# Patient Record
Sex: Female | Born: 1955 | Race: White | Hispanic: No | Marital: Married | Smoking: Former smoker
Health system: Southern US, Community
[De-identification: ages and names within clinical notes are randomized; demographics above are authoritative.]

## PROBLEM LIST (undated history)

## (undated) DIAGNOSIS — L509 Urticaria, unspecified: Secondary | ICD-10-CM

## (undated) DIAGNOSIS — Z8719 Personal history of other diseases of the digestive system: Secondary | ICD-10-CM

## (undated) DIAGNOSIS — F419 Anxiety disorder, unspecified: Secondary | ICD-10-CM

## (undated) DIAGNOSIS — F32A Depression, unspecified: Secondary | ICD-10-CM

## (undated) DIAGNOSIS — K5792 Diverticulitis of intestine, part unspecified, without perforation or abscess without bleeding: Secondary | ICD-10-CM

## (undated) DIAGNOSIS — C801 Malignant (primary) neoplasm, unspecified: Secondary | ICD-10-CM

## (undated) DIAGNOSIS — I2510 Atherosclerotic heart disease of native coronary artery without angina pectoris: Secondary | ICD-10-CM

## (undated) DIAGNOSIS — E785 Hyperlipidemia, unspecified: Secondary | ICD-10-CM

## (undated) DIAGNOSIS — I10 Essential (primary) hypertension: Secondary | ICD-10-CM

## (undated) DIAGNOSIS — J189 Pneumonia, unspecified organism: Secondary | ICD-10-CM

## (undated) DIAGNOSIS — K5790 Diverticulosis of intestine, part unspecified, without perforation or abscess without bleeding: Secondary | ICD-10-CM

## (undated) DIAGNOSIS — G8929 Other chronic pain: Secondary | ICD-10-CM

## (undated) DIAGNOSIS — N200 Calculus of kidney: Secondary | ICD-10-CM

## (undated) DIAGNOSIS — I219 Acute myocardial infarction, unspecified: Secondary | ICD-10-CM

## (undated) DIAGNOSIS — Z87442 Personal history of urinary calculi: Secondary | ICD-10-CM

## (undated) DIAGNOSIS — M797 Fibromyalgia: Secondary | ICD-10-CM

## (undated) DIAGNOSIS — N289 Disorder of kidney and ureter, unspecified: Secondary | ICD-10-CM

## (undated) DIAGNOSIS — M545 Low back pain, unspecified: Secondary | ICD-10-CM

## (undated) DIAGNOSIS — I6529 Occlusion and stenosis of unspecified carotid artery: Secondary | ICD-10-CM

## (undated) DIAGNOSIS — M1990 Unspecified osteoarthritis, unspecified site: Secondary | ICD-10-CM

## (undated) DIAGNOSIS — I2582 Chronic total occlusion of coronary artery: Secondary | ICD-10-CM

## (undated) DIAGNOSIS — K219 Gastro-esophageal reflux disease without esophagitis: Secondary | ICD-10-CM

## (undated) HISTORY — PX: TONSILLECTOMY AND ADENOIDECTOMY: SUR1326

## (undated) HISTORY — DX: Calculus of kidney: N20.0

## (undated) HISTORY — DX: Anxiety disorder, unspecified: F41.9

## (undated) HISTORY — DX: Essential (primary) hypertension: I10

## (undated) HISTORY — DX: Hyperlipidemia, unspecified: E78.5

## (undated) HISTORY — PX: ECTOPIC PREGNANCY SURGERY: SHX613

## (undated) HISTORY — PX: OTHER SURGICAL HISTORY: SHX169

## (undated) HISTORY — PX: CARDIAC SURGERY: SHX584

## (undated) HISTORY — DX: Urticaria, unspecified: L50.9

## (undated) HISTORY — DX: Gastro-esophageal reflux disease without esophagitis: K21.9

## (undated) HISTORY — DX: Atherosclerotic heart disease of native coronary artery without angina pectoris: I25.10

## (undated) HISTORY — PX: TUBAL LIGATION: SHX77

## (undated) HISTORY — PX: KNEE ARTHROPLASTY: SHX992

## (undated) HISTORY — DX: Occlusion and stenosis of unspecified carotid artery: I65.29

## (undated) HISTORY — PX: CHOLECYSTECTOMY: SHX55

---
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
## 1999-07-19 ENCOUNTER — Ambulatory Visit (HOSPITAL_BASED_OUTPATIENT_CLINIC_OR_DEPARTMENT_OTHER): Admission: RE | Admit: 1999-07-19 | Discharge: 1999-07-19 | Payer: Self-pay | Admitting: Plastic Surgery

## 2005-09-04 ENCOUNTER — Ambulatory Visit: Payer: Self-pay | Admitting: Cardiology

## 2005-09-13 ENCOUNTER — Ambulatory Visit: Payer: Self-pay | Admitting: Cardiology

## 2005-09-14 ENCOUNTER — Ambulatory Visit: Payer: Self-pay | Admitting: Cardiology

## 2005-09-21 ENCOUNTER — Ambulatory Visit: Payer: Self-pay | Admitting: Cardiology

## 2005-09-29 ENCOUNTER — Ambulatory Visit: Payer: Self-pay | Admitting: Cardiology

## 2005-09-29 ENCOUNTER — Inpatient Hospital Stay (HOSPITAL_BASED_OUTPATIENT_CLINIC_OR_DEPARTMENT_OTHER): Admission: RE | Admit: 2005-09-29 | Discharge: 2005-09-29 | Payer: Self-pay | Admitting: Cardiology

## 2010-11-27 DIAGNOSIS — I2119 ST elevation (STEMI) myocardial infarction involving other coronary artery of inferior wall: Secondary | ICD-10-CM

## 2010-11-27 HISTORY — DX: ST elevation (STEMI) myocardial infarction involving other coronary artery of inferior wall: I21.19

## 2011-03-12 ENCOUNTER — Inpatient Hospital Stay (HOSPITAL_COMMUNITY)
Admission: EM | Admit: 2011-03-12 | Discharge: 2011-03-14 | DRG: 853 | Disposition: A | Discharge status: Home / Self Care | Payer: BLUE CROSS/BLUE SHIELD | Source: Other Acute Inpatient Hospital | Attending: Cardiovascular Disease | Admitting: Cardiovascular Disease

## 2011-03-12 DIAGNOSIS — E785 Hyperlipidemia, unspecified: Secondary | ICD-10-CM | POA: Diagnosis present

## 2011-03-12 DIAGNOSIS — F17200 Nicotine dependence, unspecified, uncomplicated: Secondary | ICD-10-CM | POA: Diagnosis present

## 2011-03-12 DIAGNOSIS — Z7902 Long term (current) use of antithrombotics/antiplatelets: Secondary | ICD-10-CM

## 2011-03-12 DIAGNOSIS — I2510 Atherosclerotic heart disease of native coronary artery without angina pectoris: Secondary | ICD-10-CM

## 2011-03-12 DIAGNOSIS — I2119 ST elevation (STEMI) myocardial infarction involving other coronary artery of inferior wall: Principal | ICD-10-CM | POA: Diagnosis present

## 2011-03-12 DIAGNOSIS — Z7982 Long term (current) use of aspirin: Secondary | ICD-10-CM

## 2011-03-12 DIAGNOSIS — F411 Generalized anxiety disorder: Secondary | ICD-10-CM | POA: Diagnosis present

## 2011-03-12 DIAGNOSIS — Z91199 Patient's noncompliance with other medical treatment and regimen due to unspecified reason: Secondary | ICD-10-CM

## 2011-03-12 DIAGNOSIS — I10 Essential (primary) hypertension: Secondary | ICD-10-CM | POA: Diagnosis present

## 2011-03-12 DIAGNOSIS — Z9119 Patient's noncompliance with other medical treatment and regimen: Secondary | ICD-10-CM

## 2011-03-12 LAB — HEMOGLOBIN A1C: Mean Plasma Glucose: 108 mg/dL (ref <117)

## 2011-03-12 LAB — CARDIAC PANEL(CRET KIN+CKTOT+MB+TROPI)
Relative Index: 6.5 — ABNORMAL HIGH (ref 0.0–2.5)
Relative Index: 9.2 — ABNORMAL HIGH (ref 0.0–2.5)
Total CK: 362 U/L — ABNORMAL HIGH (ref 7–177)
Troponin I: 0.93 ng/mL (ref 0.00–0.06)
Troponin I: 3.25 ng/mL (ref 0.00–0.06)

## 2011-03-12 LAB — COMPREHENSIVE METABOLIC PANEL
ALT: 24 U/L (ref 0–35)
AST: 32 U/L (ref 0–37)
Alkaline Phosphatase: 74 U/L (ref 39–117)
CO2: 25 mEq/L (ref 19–32)
Calcium: 8.5 mg/dL (ref 8.4–10.5)
GFR calc Af Amer: >60 mL/min (ref >60)
GFR calc non Af Amer: >60 mL/min (ref >60)
Glucose, Bld: 121 mg/dL — ABNORMAL HIGH (ref 70–99)
Potassium: 3.7 mEq/L (ref 3.5–5.1)
Sodium: 138 mEq/L (ref 135–145)

## 2011-03-12 LAB — CBC
HCT: 38.3 % (ref 36.0–46.0)
Hemoglobin: 13 g/dL (ref 12.0–15.0)
MCH: 30.7 pg (ref 26.0–34.0)
MCV: 90.3 fL (ref 78.0–100.0)
Platelets: 305 10*3/uL (ref 150–400)
RBC: 4.24 MIL/uL (ref 3.87–5.11)
WBC: 11.2 10*3/uL — ABNORMAL HIGH (ref 4.0–10.5)

## 2011-03-12 LAB — MRSA PCR SCREENING: MRSA by PCR: NEGATIVE

## 2011-03-13 DIAGNOSIS — I2119 ST elevation (STEMI) myocardial infarction involving other coronary artery of inferior wall: Secondary | ICD-10-CM

## 2011-03-13 DIAGNOSIS — I517 Cardiomegaly: Secondary | ICD-10-CM

## 2011-03-13 LAB — BASIC METABOLIC PANEL
BUN: 7 mg/dL (ref 6–23)
CO2: 27 mEq/L (ref 19–32)
Glucose, Bld: 102 mg/dL — ABNORMAL HIGH (ref 70–99)
Potassium: 3.8 mEq/L (ref 3.5–5.1)
Sodium: 138 mEq/L (ref 135–145)

## 2011-03-13 LAB — CBC
HCT: 38.4 % (ref 36.0–46.0)
Hemoglobin: 12.5 g/dL (ref 12.0–15.0)
MCV: 91.9 fL (ref 78.0–100.0)
WBC: 9.8 10*3/uL (ref 4.0–10.5)

## 2011-03-13 LAB — LIPID PANEL
HDL: 48 mg/dL (ref >39)
Total CHOL/HDL Ratio: 4.8 RATIO
Triglycerides: 212 mg/dL — ABNORMAL HIGH (ref <150)
VLDL: 42 mg/dL — ABNORMAL HIGH (ref 0–40)

## 2011-03-13 LAB — TSH: TSH: 1.221 u[IU]/mL (ref 0.350–4.500)

## 2011-03-13 LAB — CARDIAC PANEL(CRET KIN+CKTOT+MB+TROPI): Relative Index: 8.1 — ABNORMAL HIGH (ref 0.0–2.5)

## 2011-03-14 LAB — CBC
HCT: 38.2 % (ref 36.0–46.0)
MCH: 30.6 pg (ref 26.0–34.0)
MCHC: 33 g/dL (ref 30.0–36.0)
MCV: 92.7 fL (ref 78.0–100.0)
RDW: 13.7 % (ref 11.5–15.5)

## 2011-03-14 LAB — BASIC METABOLIC PANEL
BUN: 10 mg/dL (ref 6–23)
Creatinine, Ser: 0.73 mg/dL (ref 0.4–1.2)
GFR calc non Af Amer: >60 mL/min (ref >60)
Glucose, Bld: 99 mg/dL (ref 70–99)
Potassium: 4 mEq/L (ref 3.5–5.1)

## 2011-03-17 ENCOUNTER — Telehealth: Payer: Self-pay | Admitting: *Deleted

## 2011-03-17 NOTE — Telephone Encounter (Signed)
Pt c/o soreness in her chest. She states it has a tingling feeling in it. She also described this as a burning sensation. It's not bad like it was with the heart attack. Pt states she used NTG last night (x1) and it helped the pain "somewhat". Pt is aware she can use up to 3 NTG over a 15 minute period. She is also aware she should go to ER for pain that is not relieved w/NTGx3 or for worsening/continued pain. She has a f/u appt w/Dr. Antoine Poche on 03/28/11.

## 2011-03-20 ENCOUNTER — Emergency Department (HOSPITAL_COMMUNITY): Payer: BC Managed Care – PPO

## 2011-03-20 ENCOUNTER — Inpatient Hospital Stay (HOSPITAL_COMMUNITY)
Admission: EM | Admit: 2011-03-20 | Discharge: 2011-03-22 | DRG: 125 | Disposition: A | Discharge status: Home / Self Care | Payer: BC Managed Care – PPO | Attending: Cardiology | Admitting: Cardiology

## 2011-03-20 DIAGNOSIS — Z7902 Long term (current) use of antithrombotics/antiplatelets: Secondary | ICD-10-CM

## 2011-03-20 DIAGNOSIS — I10 Essential (primary) hypertension: Secondary | ICD-10-CM | POA: Diagnosis present

## 2011-03-20 DIAGNOSIS — I2119 ST elevation (STEMI) myocardial infarction involving other coronary artery of inferior wall: Secondary | ICD-10-CM | POA: Diagnosis present

## 2011-03-20 DIAGNOSIS — Z7982 Long term (current) use of aspirin: Secondary | ICD-10-CM

## 2011-03-20 DIAGNOSIS — Z01810 Encounter for preprocedural cardiovascular examination: Secondary | ICD-10-CM

## 2011-03-20 DIAGNOSIS — Z01818 Encounter for other preprocedural examination: Secondary | ICD-10-CM

## 2011-03-20 DIAGNOSIS — I209 Angina pectoris, unspecified: Principal | ICD-10-CM | POA: Diagnosis present

## 2011-03-20 DIAGNOSIS — F17200 Nicotine dependence, unspecified, uncomplicated: Secondary | ICD-10-CM | POA: Diagnosis present

## 2011-03-20 DIAGNOSIS — Z01812 Encounter for preprocedural laboratory examination: Secondary | ICD-10-CM

## 2011-03-20 DIAGNOSIS — E785 Hyperlipidemia, unspecified: Secondary | ICD-10-CM | POA: Diagnosis present

## 2011-03-20 DIAGNOSIS — F411 Generalized anxiety disorder: Secondary | ICD-10-CM | POA: Diagnosis present

## 2011-03-20 DIAGNOSIS — R079 Chest pain, unspecified: Secondary | ICD-10-CM | POA: Diagnosis present

## 2011-03-20 DIAGNOSIS — I2510 Atherosclerotic heart disease of native coronary artery without angina pectoris: Secondary | ICD-10-CM | POA: Diagnosis present

## 2011-03-20 DIAGNOSIS — Z9861 Coronary angioplasty status: Secondary | ICD-10-CM

## 2011-03-20 LAB — CBC
MCH: 31.4 pg (ref 26.0–34.0)
MCV: 90.1 fL (ref 78.0–100.0)
Platelets: 298 10*3/uL (ref 150–400)
RDW: 13 % (ref 11.5–15.5)
WBC: 10 10*3/uL (ref 4.0–10.5)

## 2011-03-20 LAB — DIFFERENTIAL
Eosinophils Absolute: 0.2 10*3/uL (ref 0.0–0.7)
Eosinophils Relative: 2 % (ref 0–5)
Lymphs Abs: 2.8 10*3/uL (ref 0.7–4.0)

## 2011-03-20 LAB — BASIC METABOLIC PANEL
BUN: 14 mg/dL (ref 6–23)
Chloride: 105 mEq/L (ref 96–112)
Glucose, Bld: 108 mg/dL — ABNORMAL HIGH (ref 70–99)
Potassium: 3.7 mEq/L (ref 3.5–5.1)

## 2011-03-20 LAB — POCT CARDIAC MARKERS: CKMB, poc: 1.2 ng/mL (ref 1.0–8.0)

## 2011-03-20 NOTE — H&P (Signed)
  [name], [name]              ACCOUNT NO.:  [id number]  MEDICAL RECORD NO.:  [id number]           PATIENT TYPE:  I  LOCATION:  2910                         FACILITY:  [facility]  PHYSICIAN:  Armanda Magic, M.D.     DATE OF BIRTH:  June 18, 1956  DATE OF ADMISSION:  03/12/2011 DATE OF DISCHARGE:                             HISTORY & PHYSICAL   REFERRING PHYSICIAN:  [facility].  CHIEF COMPLAINT:  Chest pain.  HISTORY OF PRESENT ILLNESS:  This is a 55 year old white female with a history of nonobstructive coronary artery disease by cath in 2006, who presented to [facility] ER with complaints of substernal chest pain, starting around 2:00 a.m., which awakened her from sleep.  She has been having spells prior to this but very erratic.  She describes severe pressure midsternal with radiation to the arms bilaterally, shortness of breath, nausea, and diaphoresis.  Currently, she appears in moderate distress due to chest pain.  PAST MEDICAL HISTORY:  Includes hypertension, dyslipidemia, statin intolerant, anxiety.  MEDICATIONS:  Effexor, lorazepam.  SOCIAL HISTORY:  She used to smoke but quit in January 2012.  She occasionally drinks alcohol.  REVIEW OF SYSTEMS:  Otherwise as stated in HPI is negative.  PAST SURGICAL HISTORY:  Cholecystectomy, ectopic pregnancy, and tonsillectomy.  ALLERGIES:  None.  FAMILY HISTORY:  Her mother had an MI and her father has atrial fibrillation.  PHYSICAL EXAMINATION:  GENERAL:  Well-developed, well-nourished obese white female, in no acute distress. HEENT:  Benign. NECK:  Supple without lymphadenopathy.  Carotid upstrokes +2 bilaterally.  No bruits. LUNGS:  Clear to auscultation throughout. HEART:  Regular rate and rhythm.  No murmurs, rubs, or gallops.  Normal S1 and S2. ABDOMEN:  Soft, nontender, nondistended with active bowel sounds.  No hepatosplenomegaly. EXTREMITIES:  No cyanosis, erythema, or edema.  LABS:  Sodium 140,  potassium 3.7, chloride 106, bicarb 25, white cell count 9.1, hemoglobin 13.4, hematocrit 39.8, platelet count 364.  EKG shows sinus bradycardia with 1-2 mm ST elevation inferiorly and 1-mm ST depression in I and aVL.  Chest x-ray is pending.  ASSESSMENT: 1. ST elevation myocardial infarction inferiorly. 2. Hypertension, currently not on a medication. 3. Dyslipidemia, statin intolerant. 4. Medical noncompliance.  PLAN:  Emergent cath per Dr. Kirke Corin.  We will start aspirin, Plavix, continue IV heparin, no beta-blocker currently secondary to bradycardia. Further workup per Dr. Kirke Corin.     Armanda Magic, M.D.     TT/MEDQ  D:  03/13/2011  T:  03/13/2011  Job:  [id number]  Electronically Signed by Armanda Magic M.D. on 03/20/2011 07:44:15 PM

## 2011-03-21 DIAGNOSIS — R079 Chest pain, unspecified: Secondary | ICD-10-CM

## 2011-03-21 LAB — APTT: aPTT: 31 seconds (ref 24–37)

## 2011-03-21 LAB — COMPREHENSIVE METABOLIC PANEL
Alkaline Phosphatase: 89 U/L (ref 39–117)
BUN: 14 mg/dL (ref 6–23)
Calcium: 9.7 mg/dL (ref 8.4–10.5)
Glucose, Bld: 107 mg/dL — ABNORMAL HIGH (ref 70–99)
Potassium: 4 mEq/L (ref 3.5–5.1)
Total Protein: 6.9 g/dL (ref 6.0–8.3)

## 2011-03-21 LAB — CARDIAC PANEL(CRET KIN+CKTOT+MB+TROPI)
CK, MB: 1.7 ng/mL (ref 0.3–4.0)
Total CK: 101 U/L (ref 7–177)

## 2011-03-21 LAB — HEPARIN LEVEL (UNFRACTIONATED): Heparin Unfractionated: 0.71 IU/mL — ABNORMAL HIGH (ref 0.30–0.70)

## 2011-03-21 LAB — CK TOTAL AND CKMB (NOT AT ARMC)
CK, MB: 2.1 ng/mL (ref 0.3–4.0)
Total CK: 129 U/L (ref 7–177)

## 2011-03-21 LAB — TROPONIN I: Troponin I: 0.06 ng/mL (ref 0.00–0.06)

## 2011-03-22 DIAGNOSIS — I2510 Atherosclerotic heart disease of native coronary artery without angina pectoris: Secondary | ICD-10-CM

## 2011-03-23 ENCOUNTER — Emergency Department (HOSPITAL_COMMUNITY)
Admission: EM | Admit: 2011-03-23 | Discharge: 2011-03-23 | Disposition: A | Discharge status: Home / Self Care | Payer: BC Managed Care – PPO | Attending: Emergency Medicine | Admitting: Emergency Medicine

## 2011-03-23 ENCOUNTER — Emergency Department (HOSPITAL_COMMUNITY): Payer: BC Managed Care – PPO

## 2011-03-23 DIAGNOSIS — R51 Headache: Secondary | ICD-10-CM | POA: Insufficient documentation

## 2011-03-23 DIAGNOSIS — R55 Syncope and collapse: Secondary | ICD-10-CM | POA: Insufficient documentation

## 2011-03-23 DIAGNOSIS — R112 Nausea with vomiting, unspecified: Secondary | ICD-10-CM | POA: Insufficient documentation

## 2011-03-23 LAB — POCT CARDIAC MARKERS
Myoglobin, poc: 29.9 ng/mL (ref 12–200)
Myoglobin, poc: 29.8 ng/mL (ref 12–200)

## 2011-03-23 LAB — CBC
MCH: 30.4 pg (ref 26.0–34.0)
MCHC: 33.2 g/dL (ref 30.0–36.0)
Platelets: 269 10*3/uL (ref 150–400)
RBC: 4.11 MIL/uL (ref 3.87–5.11)
RDW: 13 % (ref 11.5–15.5)

## 2011-03-23 LAB — BASIC METABOLIC PANEL
Calcium: 8.7 mg/dL (ref 8.4–10.5)
Creatinine, Ser: 0.63 mg/dL (ref 0.4–1.2)
GFR calc Af Amer: >60 mL/min (ref >60)
GFR calc non Af Amer: >60 mL/min (ref >60)

## 2011-03-23 LAB — DIFFERENTIAL
Basophils Relative: 0 % (ref 0–1)
Eosinophils Absolute: 0.1 10*3/uL (ref 0.0–0.7)
Eosinophils Relative: 1 % (ref 0–5)
Monocytes Absolute: 0.9 10*3/uL (ref 0.1–1.0)
Monocytes Relative: 8 % (ref 3–12)
Neutrophils Relative %: 77 % (ref 43–77)

## 2011-03-23 NOTE — H&P (Signed)
[name], [name]              ACCOUNT NO.:  [id number]  MEDICAL RECORD NO.:  [id number]           PATIENT TYPE:  O  LOCATION:  2011                         FACILITY:  [facility]  PHYSICIAN:  Armanda Magic, M.D.     DATE OF BIRTH:  10/31/1956  DATE OF ADMISSION:  03/20/2011 DATE OF DISCHARGE:                             HISTORY & PHYSICAL   REFERRING PHYSICIAN:  Doug Sou, MD  PRIMARY CARDIOLOGIST:  Rollene Rotunda, MD, [facility]  CHIEF COMPLAINT:  Chest pain.  HISTORY OF PRESENT ILLNESS:  This is a 55 year old female with a history of CAD status post recent acute inferior ST elevation MI on March 12, 2011, at which time, she underwent PCI of the RCA x2 to the proximal and distal RCA and was later discharged to home.  She had been in her usual state of health until the last 3 days when she started having pain between her shoulder blades and, what she calls, spasm in her left chest.  This radiated into her left arm along with left arm numbness similar to her prior MI.  Tonight, she had 2 severe episodes of chest pain and she took 2 sublingual nitroglycerin.  She denied any shortness of breath, but was diaphoretic.  Currently, she is pain free.  Past medical history include: 1. CAD status post ST elevation MI inferiorly status post PCI with     drug-eluting stent to the RCA x2. 2. Dyslipidemia. 3. Hypertension. 4. Tobacco abuse. 5. Anxiety. 6. Normal LV function, EF 60% at the time of cath.  She has no known drug allergies.  FAMILY HISTORY:  Mother had an MI, father has AFib.  PAST SURGICAL HISTORY: 1. Status post cholecystectomy. 2. Surgery for ectopic pregnancy. 3. Tonsillectomy.  SOCIAL HISTORY:  She has a remote history of tobacco and she quit in January 2012.  She occasionally drinks alcohol.  Medications include: 1. Aspirin 81 mg daily. 2. Plavix 75 mg daily. 3. Lipitor 80 mg daily. 4. Lisinopril 5 mg daily. 5. Lorazepam 1 mg nightly p.r.n. 6. Lopressor 25 mg  one half tablet b.i.d. 7. Protonix 40 mg daily. 8. Allegra 180 mg daily. 9. Effexor 75 mg daily.  REVIEW OF SYSTEMS:  Other than what is stated in HPI is negative.  PHYSICAL EXAM:  VITAL SIGNS:  Blood pressure is 127/75, heart rate is 66. GENERAL:  This is a well-developed, well-nourished, white female in no acute distress. HEENT:  Benign. NECK:  Supple without lymphadenopathy.  Carotid upstroke is +2 bilaterally, no bruits. LUNGS:  Clear to auscultation throughout. HEART:  Regular rate and rhythm.  No murmurs, rubs, or gallops.  Normal S1, S2. ABDOMEN:  Soft, nontender, nondistended with active bowel sounds.  No hepatosplenomegaly. EXTREMITIES:  No cyanosis, erythema, or edema.  LABORATORY DATA:  Sodium 138, potassium 3.1, chloride 105, bicarb 27, BUN 14, creatinine 0.67.  White cell count 10, hemoglobin 14, hematocrit 40.2, platelet count 198.  Point-of-care markers negative x1.  EKG shows sinus rhythm at 77 beats per minute with inferior infarct and T-wave inversions in lead III and aVF.  Chest x-ray shows no active disease.  ASSESSMENT: 1. Chest  pain consistent with recent ST elevation myocardial     infarction with drug-eluting stent to the right coronary artery x2,     question whether this may be spasm.  It resolved after sublingual     nitroglycerin.  EKG shows post myocardial infarction T-wave     inversion inferiorly and inferior Q-waves.  No change from EKG at     time of discharge. 2. Hypertension. 3. Dyslipidemia.  PLAN:  Admit for 23-hour observation.  Cycle cardiac enzymes.  IV heparin, nitroglycerin drip.  Continue aspirin, Plavix, beta-blocker, statin, ACE inhibitor.  We will make n.p.o. after midnight.  Further workup per [facility].     Armanda Magic, M.D.     TT/MEDQ  D:  03/21/2011  T:  03/21/2011  Job:  [id number]  cc:   Rollene Rotunda, MD, [facility]  Electronically Signed by Armanda Magic M.D. on 03/23/2011 01:49:31 PM

## 2011-03-24 ENCOUNTER — Telehealth: Payer: Self-pay | Admitting: *Deleted

## 2011-03-24 NOTE — Telephone Encounter (Signed)
Pt's family member left message on voicemail asking for return call regarding medications pt was prescribed at d/c from [facility].  Spoke with pt who states she was prescribed Diltiazem 120mg  daily and Isosorbide Mono 30mg  daily. She states she has had a horrible headache since starting these medications. She states her HA was so bad yesterday that she had nausea/vomiting and went to the ER at [facility]. She was prescribed Norco 5/325mg  for pain.  Pt instructed to try 1/2 tablet of the isosorbide for a couple of days. If headache becomes more manageable or is relieved w/this dosing then increase back to 30mg  daily. Pt notified that generally w/isosorbide the headaches will subside within a week or so as she gets use to this medication. Pt will try this and notify the office of any further problems.

## 2011-03-25 ENCOUNTER — Encounter: Payer: Self-pay | Admitting: Cardiology

## 2011-03-25 DIAGNOSIS — I10 Essential (primary) hypertension: Secondary | ICD-10-CM | POA: Insufficient documentation

## 2011-03-25 DIAGNOSIS — E785 Hyperlipidemia, unspecified: Secondary | ICD-10-CM | POA: Insufficient documentation

## 2011-03-25 DIAGNOSIS — I2510 Atherosclerotic heart disease of native coronary artery without angina pectoris: Secondary | ICD-10-CM | POA: Insufficient documentation

## 2011-03-25 DIAGNOSIS — F419 Anxiety disorder, unspecified: Secondary | ICD-10-CM | POA: Insufficient documentation

## 2011-03-28 ENCOUNTER — Ambulatory Visit (INDEPENDENT_AMBULATORY_CARE_PROVIDER_SITE_OTHER): Payer: BC Managed Care – PPO | Admitting: Cardiology

## 2011-03-28 ENCOUNTER — Encounter: Payer: Self-pay | Admitting: *Deleted

## 2011-03-28 ENCOUNTER — Encounter: Payer: Self-pay | Admitting: Cardiology

## 2011-03-28 VITALS — BP 124/84 | HR 89 | Ht 62.0 in | Wt 169.0 lb

## 2011-03-28 DIAGNOSIS — I2510 Atherosclerotic heart disease of native coronary artery without angina pectoris: Secondary | ICD-10-CM

## 2011-03-28 DIAGNOSIS — F17200 Nicotine dependence, unspecified, uncomplicated: Secondary | ICD-10-CM

## 2011-03-28 DIAGNOSIS — Z720 Tobacco use: Secondary | ICD-10-CM | POA: Insufficient documentation

## 2011-03-28 DIAGNOSIS — E669 Obesity, unspecified: Secondary | ICD-10-CM

## 2011-03-28 DIAGNOSIS — I10 Essential (primary) hypertension: Secondary | ICD-10-CM

## 2011-03-28 DIAGNOSIS — E785 Hyperlipidemia, unspecified: Secondary | ICD-10-CM

## 2011-03-28 DIAGNOSIS — I6529 Occlusion and stenosis of unspecified carotid artery: Secondary | ICD-10-CM | POA: Insufficient documentation

## 2011-03-28 NOTE — Progress Notes (Signed)
HPI The patient presents for followup after a recent cardiac MI.  She had an inferior myocardial infarction April 15. She had 2 drug-eluting stents to the right coronary. She subsequently had recurrent chest pain and had another cardiac catheterization on April 25. She was felt to possibly have coronary spasm as she did rule in.  However, she was found to have patent stents.  She was started on calcium channel blocker and her beta blocker was stopped. Of note her EF was 55-60%.  Since her last hospitalization she has had no recurrent chest pain. She has not required nitroglycerin. She is about to start cardiac rehabilitation. She denies any new shortness of breath, PND or orthopnea. She has had no palpitations, presyncope or syncope. She has had no weight gain or edema. She has continued to have headaches with her Imdur.  No Known Allergies  Current Outpatient Prescriptions  Medication Sig Dispense Refill  . acetaminophen (TYLENOL) 325 MG tablet Take 650 mg by mouth every 4 (four) hours as needed.        Marland Kitchen aspirin 81 MG EC tablet Take 81 mg by mouth daily.        Marland Kitchen atorvastatin (LIPITOR) 80 MG tablet Take 80 mg by mouth daily.        . clopidogrel (PLAVIX) 75 MG tablet Take 75 mg by mouth daily.        Marland Kitchen diltiazem (CARDIZEM CD) 120 MG 24 hr capsule Take 120 mg by mouth daily.        . fexofenadine (ALLEGRA) 180 MG tablet Take 180 mg by mouth daily.        . isosorbide mononitrate (IMDUR) 30 MG 24 hr tablet Take 30 mg by mouth daily.        Marland Kitchen lisinopril (PRINIVIL,ZESTRIL) 5 MG tablet Take 5 mg by mouth daily.        Marland Kitchen LORazepam (ATIVAN) 1 MG tablet Take 1 mg by mouth daily as needed.        . nitroGLYCERIN (NITROSTAT) 0.4 MG SL tablet Place 0.4 mg under the tongue every 5 (five) minutes as needed.        . pantoprazole (PROTONIX) 40 MG tablet Take 40 mg by mouth daily.        Marland Kitchen venlafaxine (EFFEXOR-XR) 75 MG 24 hr capsule Take 75 mg by mouth daily.        Marland Kitchen DISCONTD: pantoprazole (PROTONIX) 20 MG  tablet Take 20 mg by mouth daily.        Marland Kitchen DISCONTD: venlafaxine (EFFEXOR) 75 MG tablet Take 75 mg by mouth daily.          Past Medical History  Diagnosis Date  . CAD (coronary artery disease)     PCI Stent RCA 03/12/11.  Cath 03/22/11 LVEF 55-60%.  RCA patent  proximal/distal stents with 30% proximal disease and 20% mid stenosis.  LCX with 30%  mid stenosis.  Questionable spasm.  Marland Kitchen HTN (hypertension)   . Hyperlipidemia   . Anxiety   . Carotid stenosis     Less than 50% 8/11    Past Surgical History  Procedure Date  . Ectopic pregnancy surgery   . Tonsillectomy and adenoidectomy   . Cholecystectomy     ROS: As stated in the HPI and negative for all other systems.  PHYSICAL EXAM BP 124/84  Pulse 89  Ht 5\' 2"  (1.575 m)  Wt 169 lb (76.658 kg)  BMI 30.91 kg/m2 GENERAL:  Well appearing HEENT:  Pupils equal round and reactive,  fundi not visualized, oral mucosa unremarkable NECK:  No jugular venous distention, waveform within normal limits, carotid upstroke brisk and symmetric, no bruits, no thyromegaly LYMPHATICS:  No cervical, inguinal adenopathy LUNGS:  Clear to auscultation bilaterally BACK:  No CVA tenderness CHEST:  Unremarkable HEART:  PMI not displaced or sustained,S1 and S2 within normal limits, no S3, no S4, no clicks, no rubs, no murmurs ABD:  Flat, positive bowel sounds normal in frequency in pitch, no bruits, no rebound, no guarding, no midline pulsatile mass, no hepatomegaly, no splenomegaly EXT:  2 plus pulses throughout, no edema, no cyanosis no clubbing, Bruising of the right radial catheterization site SKIN:  No rashes no nodules NEURO:  Cranial nerves II through XII grossly intact, motor grossly intact throughout PSYCH:  Cognitively intact, oriented to person place and time  ASSESSMENT AND PLAN

## 2011-03-28 NOTE — Assessment & Plan Note (Signed)
The blood pressure is at target. No change in medications is indicated. We will continue with therapeutic lifestyle changes (TLC).  

## 2011-03-28 NOTE — Patient Instructions (Signed)
   Follow up in 2 months as scheduled. Your physician recommends that you continue on your current medications as directed. Please refer to the Current Medication list given to you today.

## 2011-03-28 NOTE — Assessment & Plan Note (Signed)
She has coronary disease as described and possible spasm. No further testing is indicated at this point she will continue with aggressive risk reduction.

## 2011-03-28 NOTE — Assessment & Plan Note (Signed)
She is committed to losing weight with diet and exercise.

## 2011-03-28 NOTE — Assessment & Plan Note (Signed)
She continues to abstain from cigarettes. I congratulated her on this.Marland Kitchen

## 2011-03-28 NOTE — Assessment & Plan Note (Signed)
She is now tolerating Lipitor which she has not tolerated in the past. I would take an aggressive stand and asked for her LDL to be less than 70 and HDL greater than 50.

## 2011-03-29 NOTE — Cardiovascular Report (Signed)
[name], [name]              ACCOUNT NO.:  [id number]  MEDICAL RECORD NO.:  [id number]           PATIENT TYPE:  O  LOCATION:  6599                         FACILITY:  [facility]  PHYSICIAN:  Lorine Bears, MD     DATE OF BIRTH:  Jan 11, 1956  DATE OF PROCEDURE: DATE OF DISCHARGE:  03/22/2011                           CARDIAC CATHETERIZATION   PRIMARY CARDIOLOGIST:  Rollene Rotunda, MD, [facility]  PROCEDURES PERFORMED: 1. Left heart catheterization 2. Coronary angiography. 3. Left ventricular angiography.  INDICATIONS AND CLINICAL HISTORY:  This is a 55 year old female who was treated recently on March 12, 2011, for inferior ST elevation myocardial infarction.  At that time, I performed her cardiac catheterization, which showed significant mid and distal RCA stenosis.  She had angioplasty and two drug-eluting stent placements at that time.  The rest of her coronary anatomy showed no obstructive disease.  Her ejection fraction was normal.  She did well initially.  However, over the last few days, she has been having intermittent substernal chest tightness radiating to her shoulder and left arm very similar to her symptoms of myocardial infarction, but less intense.  She was admitted to the hospital where her cardiac enzymes were negative.  She did have some inferior EKG changes during these episodes.  Due to her history and symptoms, cardiac catheterization was recommended.  Risks, benefits, and alternatives were discussed with the patient.  Access is right radial artery.  STUDY DETAILS:  Standard informed consent was obtained.  The right radial area was prepped in a sterile fashion.  It was anesthetized with 1% lidocaine.  A 5-French sheath was placed in the right radial artery after an anterior puncture.  Verapamil 3 mg was given through the sheath twice, second time was for suspected spasm.  Unfractionated heparin 4000 units was given intravenously.  Coronary angiography as well  as left ventricular angiography was performed with a Jacky catheter.  This catheter could not engage the left main and thus, JL-3.5 catheter was used.  All catheter exchanges were done over the wire.  At the end of the procedure, the sheath was removed.  TR band was applied.  There was no immediate complications.  STUDY FINDINGS:  Hemodynamic findings:  Aortic pressure is 113/67 with a mean of 87 mmHg.  Left ventricular pressure is 114/17 with a left ventricular end-diastolic pressure of 18 mmHg.  No significant gradient was noted across the aortic valve.  Left ventricular angiography:  This showed normal LV systolic function with an estimated ejection fraction of 55-60% with normal wall motion. This was done by manual injection.  CORONARY ANGIOGRAPHY:  Right coronary artery:  The vessel was normal in size and dominant.  A stent is noted extending from the proximal to the mid area, which is patent and free of any significant restenosis.  There is also a stent distally before the bifurcation, which is also patent with no significant restenosis.  There is a 30% lesion in the proximal RCA before the stent, which is unchanged.  There is also a 20% diffuse disease in the midsegment between the two stent.  The PDA and posterolateral branches are overall  medium size and free of any significant disease. Left main coronary artery:  The vessel was normal in size and free of significant disease. Left anterior descending artery:  The vessel was normal in size with mild atherosclerosis.  There is a discrete 20% lesion in the mid segment.  The rest of the LAD is free of significant disease.  First and second diagonals are small sized branches.  Third diagonal is a large size and free of significant disease. Left circumflex artery:  The vessel was normal in size and nondominant. There is a 30% lesion in the mid segment before the AV groove artery. First and second OMs are very small in size.  OM3  is normal size and free of significant disease.  The AV groove artery is small size and has diffuse atherosclerosis without obstructive disease.  STUDY CONCLUSIONS: 1. Patent stents in the right coronary artery. 2. Possible coronary spasm given her symptoms of chest pain and     associated with inferior ST segment changes. 3. No significant disease in the LAD and left circumflex arteries. 4. Normal LV systolic function with mildly elevated left ventricular     end-diastolic pressure.  RECOMMENDATIONS:  We will treat the patient for presumed coronary spasm. We will change her metoprolol to a calcium channel blocker.  We will also add long-acting nitroglycerin.  I asked her to stay away from any nicotine products.  She will be monitored the rest of the day.  Continue aggressive medical therapy and dual antiplatelet therapy.     Lorine Bears, MD     MA/MEDQ  D:  03/22/2011  T:  03/22/2011  Job:  [id number]  cc:   Rollene Rotunda, MD, [facility]  Electronically Signed by Lorine Bears MD on 03/29/2011 03:22:32 PM

## 2011-03-29 NOTE — Discharge Summary (Signed)
NAMEJEARLDEAN, [name]              ACCOUNT NO.:  [id number]  MEDICAL RECORD NO.:  [id number]           PATIENT TYPE:  O  LOCATION:  6599                         FACILITY:  [facility]  PHYSICIAN:  Lorine Bears, MD     DATE OF BIRTH:  01/03/1956  DATE OF ADMISSION:  03/20/2011 DATE OF DISCHARGE:  03/22/2011                              DISCHARGE SUMMARY   PRIMARY CARDIOLOGIST:  Rollene Rotunda, MD, [facility]  DISCHARGE DIAGNOSIS: 1. Chest pain, question coronary spasm.     a.     Cardiac catheterization, March 22, 2011:  Patent right      coronary artery stents without significant change in other vessels      from prior cath.  Change metoprolol to calcium channel blocker and      add long-acting nitrate.     b.     Follow up as previously scheduled, Mar 28, 2011, at [facility] [place].  SECONDARY DIAGNOSES: 1. Coronary artery disease, March 12, 2011.     a.     Status post inferior ST-elevation myocardial infarction,      percutaneous coronary intervention with drug-eluting stents to      right coronary artery x2. 2. Dyslipidemia. 3. Hypertension. 4. Ongoing tobacco abuse. 5. Anxiety.  ALLERGIES:  NKDA.  PROCEDURES: 1. EKG, March 20, 2011:  NSR, 77 bpm, minimal T-wave inversions in     lead III and aVF and likely significant Q-waves in those leads as     well.  No other significant ST-T wave changes, normal axis, no     evidence of hypertrophy.  PR 162, QRS 82, and QTc of 411. 2. Chest x-ray, March 20, 2011:  No acute cardiopulmonary process. 3. Cardiac catheterization, March 22, 2011:  LVEF 55-60%.  RCA patent     proximal/distal stents with 30% proximal disease and 20% mid     diffuse stenosis.  LM normal.  LAD 20% mid stenosis.  LCX with 30%     mid stenosis.  Question coronary spasm.  HISTORY OF PRESENT ILLNESS:  Ms. Clingenpeel is a 55 year old female with the above-noted complex medical history including inferior STEMI on March 12, 2011, for which she underwent PCI  with DES x2 to the right coronary artery, proximal and distal sections of vessel.  The patient has been under her usual state of health until last 3 days when she started having chest discomfort in her shoulder blades and what she calls as spasm in her left chest.  This discomfort radiated into her left arm and was associated with left arm numbness similar to her symptoms prior to her acute MI.  That night, she had 2 severe episodes of chest discomfort and took 2 sublingual nitroglycerin.  She denied associated symptoms including no shortness breath or diaphoresis.  When evaluated by Cardiology at [facility] ED, she was asymptomatic.  HOSPITAL COURSE:  The patient was admitted and cardiac enzymes were cycled, all of which were negative.  However, the patient had recurrent chest discomfort and given her pretest probability and similar symptoms prior episode, she underwent diagnostic cardiac catheterization on March 22, 2011.  Luckily, there were no significant new findings and patent RCA stents.  However, there still is the question of coronary spasm and due to her similar symptoms previously, her metoprolol will be switched to calcium channel blocker (diltiazem CD 120 mg p.o. daily) and Imdur 30 mg p.o. daily will also be added.  The patient had her postcath orders completed and then was evaluated by her interventional cardiologist, Dr. Lorine Bears who deemed her stable for discharge on the early evening of March 22, 2011.  Given her recurrent symptoms and medication changes, she will continue to follow up with her previously made appointment with Dr. Antoine Poche in [place] on Mar 28, 2011, at 1:15 p.m.  At the time for discharge, the patient received her new medication list, prescriptions, follow up instructions, and postcath instructions.  All questions and concerns were addressed prior to her leaving the hospital.  DISCHARGE LABORATORY DATA:  WBC is 10.0, HGB 14.0, HCT 40.2, and PLT count is  298 with WBC differential within normal limits.  Protime 12.8 and INR 0.94.  Sodium 140, potassium 4.0, chloride 104, bicarb 29, BUN 14, creatinine 0.74, and glucose 107.  Liver function tests within normal limits except for slightly low total bilirubin at 0.2, albumin 3.8, calcium 9.7, and total protein 6.9.  Point-of-care markers negative x1.  Three full sets of enzymes negative.  FOLLOWUP PLANS AND APPOINTMENTS:  Please see hospital course.  DISCHARGE MEDICATIONS: 1. Acetaminophen 325 mg 1-2 tabs p.o. q.4 h p.r.n. 2. Diltiazem CD 120 mg 1 tablet p.o. daily. 3. Imdur 30 mg p.o. daily. 4. Allegra 180 mg p.o. daily. 5. Enteric-coated aspirin 81 mg daily. 6. Clopidogrel 75 mg 1 tablet p.o. daily with meal. 7. Effexor 75 mg 1 capsule p.o. daily. 8. Lipitor 80 mg p.o. at bedtime. 9. Lorazepam 1 mg 1 tablet p.o. at bedtime p.r.n. 10.Lisinopril 5 mg 1 tablet p.o. daily. 11.Nitroglycerin 0.4 mg sublingual q.5 minutes up to 3 doses p.r.n.     for chest discomfort. 12.Protonix 20 mg p.o. daily.  DURATION OF DISCHARGE ENCOUNTER:  Including physician time was 35 minutes.     Jarrett Ables, PAC   ______________________________ Lorine Bears, MD    MS/MEDQ  D:  03/22/2011  T:  03/23/2011  Job:  [id number]  cc:   Rollene Rotunda, MD, [facility]  Electronically Signed by Jarrett Ables PAC on 03/24/2011 02:38:04 PM Electronically Signed by Lorine Bears MD on 03/29/2011 03:25:24 PM

## 2011-03-29 NOTE — Cardiovascular Report (Signed)
[name], [name]              ACCOUNT NO.:  [id number]  MEDICAL RECORD NO.:  [id number]           PATIENT TYPE:  I  LOCATION:  2910                         FACILITY:  [facility]  PHYSICIAN:  Lorine Bears, MD     DATE OF BIRTH:  1956/05/20  DATE OF PROCEDURE:  03/12/2011 DATE OF DISCHARGE:                           CARDIAC CATHETERIZATION   PRIMARY CARE PHYSICIAN:  Dr. Selinda Flavin in [place].  PROCEDURES PERFORMED: 1. Emergent left heart catheterization. 2. Coronary angiography. 3. Left ventricular angiography. 4. Right coronary artery angioplasty and two drug-eluting stent     placement to the distal as well as proximal segment.  Access is right femoral artery.  INDICATION AND CLINICAL HISTORY:  This is a 55 year old female with a past medical history of hypertension, hyperlipidemia and tobacco use. 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  She presented with a acute onset of substernal chest tightness with significant dyspnea.  She went to [facility], where her ECG showed inferior ST elevation.  Thus, she was transferred emergently to the catheterization lab for coronary angiography and possible coronary intervention.  The patient was hemodynamically stable on arrival to the cath lab.  I discussed the risks, benefits and alternatives with the patient.  STUDY DETAILS:  An emergent consent was obtained.  The right femoral area was prepped in a sterile fashion.  It was anesthetized with 1% lidocaine.  A 6-French sheath was placed in the right femoral artery. The patient was already given aspirin 325, Plavix 300 mg as well as 5000 units of unfractionated heparin.  I initiated IV bivalirudin.  Coronary angiography was performed with the JL-4 catheter.  Right coronary angiography and intervention was performed with a JR-4 guiding catheter. LV angiography was performed with a  pigtail catheter.  All catheter exchanges were done over the wire.  INTERVENTIONAL PROCEDURE DETAILS:  I used an intuition wire without much difficulty.  Two lesions in the right coronary artery was noted and both of them were 99%.  I elected to predilate the proximal lesion first with a 2.5 x 12-mm track balloon to 10 atmospheres.  I then dilated the distal lesion with a same balloon to 8 atmosphere.  It appears that the culprit lesion is the distal lesion as it looked hazy and also it was associated with significant hemodynamic changes with every balloon inflation.  After the first inflation in the distal segment, the patient became bradycardiac and hypotensive.  She was treated with normal saline bolus as well as half an amp of atropine.  This improved her hemodynamics significantly with resolved hypotension.  She did not require any dopamine.  I then placed 2.75 x 24-mm Promus element drug- eluting stent in the distal RCA and deployed at the 12 atmosphere.  This was postdilated with a 3.0 x 20-mm  track balloon to 12 atmosphere distally and 14 atmosphere proximally.  I then placed a 3.0 x 33-mm Zions drug-eluting stent in the proximal extending to the mid segment of the right coronary artery and deployed at the 214 atmosphere.  This was postdilated with a 3.5 x 25-mm Rulo track balloon to 16 atmosphere distally and 20 atmosphere in the mid and proximal segment.  Angiography showed excellent results.  I wanted to post dilate the mid segment with a 4.0 noncompliant balloon, but that did not pass the stent.  Thus, given that the final results looked very good I elected not to pursue this any further.  The patient tolerated the procedure well with no immediate complications.  The sheath was removed and the site was closed with a Perclose device.  STUDY FINDINGS:  Hemodynamic findings:  Left ventricular pressure was 138/12 with a left ventricular end-diastolic pressure of 15  mmHg. Aortic pressure was 133/86 with a mean pressure of 108 mmHg.  LEFT VENTRICULAR ANGIOGRAPHY:  This showed normal LV systolic function with an estimated ejection fraction of 60% with normal wall motion.  CORONARY ANGIOGRAPHY:  Right coronary artery:  The vessel was large in size and dominant.  In the proximal segment, there is a 20% stenosis close to the ostium.  In the proximal and extending to the mid segment, there is diffuse 70% disease, pending in a 99% stenosis at an RV marginal branch.  This segment has significant remodelling and localized areas of ectasia.  In the distal RCA close to the bifurcation, there is a 99% hazy lesion likely at the site of a plaque rupture.  The PDA is a medium size and free of significant disease.  The posterolateral branches are free of any significant disease.  LEFT MAIN CORONARY ARTERY:  The vessel was normal in size and free of significant disease.  LEFT ANTERIOR DESCENDING ARTERY:  The vessel was normal in size with minor irregularities without any obstructive disease.  First diagonal is small size overall.  Second diagonal is normal in size and free of significant disease.  LEFT CIRCUMFLEX ARTERY:  The vessel was normal in size and nondominant. There is a 30% discrete lesion in the mid segment.  OM-1 is a very tiny branch.  OM-2 is also small in size.  OM-3 is normal size and free of significant disease.  STUDY CONCLUSION: 1. Acute inferior ST elevation myocardial infarction due to subtotal     occlusion of the right coronary artery into areas.  The culprit     lesion is likely the distal lesion close to the bifurcation at 99%.     However, there was another lesion in the proximal to mid segment at     the 99% as well. 2. Normal LV systolic function. 3. Successful angioplasty and two drug-eluting stent placements to the     distal as well as the proximal right coronary artery.  RECOMMENDATIONS: 1. Aspirin daily indefinitely. 2.  Plavix 75 mg once daily for at least 12 months. 3. Smoking cessation is strongly advised. 4. Aggressive treatment of risk factors is recommended.     Lorine Bears, MD     MA/MEDQ  D:  03/12/2011  T:  03/12/2011  Job:  [id number]  cc:   Selinda Flavin  Electronically Signed by Lorine Bears MD on 03/29/2011 03:21:32 PM

## 2011-04-10 NOTE — Telephone Encounter (Signed)
Pt seen in the office 03/28/11

## 2011-04-13 NOTE — Discharge Summary (Signed)
[name], [name]              ACCOUNT NO.:  [id number]  MEDICAL RECORD NO.:  [id number]           PATIENT TYPE:  I  LOCATION:  2910                         FACILITY:  [facility]  PHYSICIAN:  Marca Ancona, MD      DATE OF BIRTH:  02-13-1956  DATE OF ADMISSION:  03/12/2011 DATE OF DISCHARGE:  03/14/2011                              DISCHARGE SUMMARY   PRIMARY CARDIOLOGIST:  New patient to [facility].  PRIMARY CARE PROVIDER:  Dr. Selinda Flavin.  DISCHARGE DIAGNOSES: 1. Acute inferior ST-elevation myocardial infarction, status post     successful angioplasty, and two drug-eluting stent to the distal as     well as proximal right coronary artery. 2. Hypercholesterolemia. 3. Hypertension. 4. Tobacco abuse.  SECONDARY DIAGNOSIS:  Anxiety.  PROCEDURE/DIAGNOSTICS PERFORMED DURING HOSPITALIZATION: 1. Emergent left heart catheterization with coronary angiography and     left ventricular angiography.     a.     Status post right coronary angioplasty and placement of two      drug-eluting stents.  Proximal segment of the right coronary      artery with an Xience prime LL 3.0 mm x 33 mm drug-eluting stent      as well as a Promus Element Plus drug-eluting stent 2.75 mm x 24      mm to the distal RCA.     b.     Normal LV systolic function, estimated 60%.     c.     Normal left main, left circumflex with 30% discrete lesion      in the mid segment.  Normal LAD. 2. 2-D echocardiogram on March 12, 2011:  Left ventricle with normal     cavity size, mild LVH.  There is mild focal basilar hypertrophy of     the septum.  Ejection fraction estimated at 55%-60%.  No defect or     patent foramen ovale noted.  REASON FOR HOSPITALIZATION:  This is a 55 year old female with the above- stated problem list, and mild nonobstructive coronary artery disease per cardiac catheterization in 2006.  She states she was in her usual state of health until she had acute onset of substernal chest  tightness with significant dyspnea.  She arrived to [facility] where her EKG demonstrated inferior ST elevation.  The patient was subsequently transferred for emergent catheterization to [facility] cath lab.  Risks, benefits, and indications were discussed with the patient for cardiac catheterization.  She agreed to proceed.  HOSPITAL COURSE:  The patient was taken emergently to the cardiac cath lab by Dr. Azucena Kuba on March 12, 2011.  Emergent consent was obtained. Again, EKG showed sinus bradycardia with 1-2 mm ST elevation in the inferior leads as well as 1-mm ST depression and one in aVL.  As above, the patient's acute inferior ST-elevation myocardial infarction was due to a subtotal occlusion of the right coronary artery in two areas.  The culprit lesion was felt to be the distal lesion close to the bifurcation of 99%.  However, the second lesion in the proximal segment was also approximately 99%.  Dr. Azucena Kuba was then performed successful angioplasty and placement  of two drug-eluting stent to the distal as well as proximal right coronary artery.  The patient tolerated the procedure well.  It was noted the patient should be on aspirin indefinitely as well as Plavix for at least 12 months.  She needs aggressive treatment of risk factors specifically smoking cessation.  The patient's troponin peaked at 3.47.  The patient was taken to the CCU for further observation.  The patient was started on beta-blocker post catheterization, but with her bradycardia, this had to be decreased to a lower dose.  She has been tolerating her low-dose beta-blocker well. She was also started on an ACE inhibitor as well as a statin.  These will be continued at discharge.  The patient did walk with cardiac rehab.  At that time, she denied any chest pain.  She did request referral for outpatient rehab in [place] be sent, this has been completed. After walking, the patient returned to her room and states that she  did have 1/10 chest pain.  She took a nitroglycerin and felt improvement. The patient had no further complaints of chest pain.  On day of discharge, the patient ambulated in the halls without difficulty.  She again worked with cardiac rehab and had no further complaints of chest pain or shortness of breath.  On day of discharge, Dr. Shirlee Latch evaluated the patient and noted her stable for home.  She will have close outpatient followup.  Smoking cessation has been encouraged, the patient voices understanding.  The patient will be discharged in stable condition.  DISCHARGE LABS:  Sodium 141, potassium 4, chloride 104, bicarb 30, BUN 10, creatinine 0.73, WBC 10.1, hemoglobin 12.6, hematocrit 38.2, platelets 305.  DISCHARGE MEDICATIONS: 1. Aspirin 81 mg daily. 2. Plavix 75 mg daily. 3. Lipitor 80 mg daily. 4. Lisinopril 5 mg daily. 5. Lopressor 25 mg half tablet twice daily. 6. Protonix 40 mg daily. 7. Allegra 180 mg daily. 8. Effexor 75 mg capsule daily. 9. Lorazepam 1 mg daily at bedtime as needed for sleep. 10.Nitroglycerin sublingual 0.4 mg 1 tablet under tongue with onset of     chest pain and may repeat every 5 minutes up to 3 doses. 11.Prilosec 20 mg daily - please stop taking.  FOLLOWUP PLANS AND INSTRUCTIONS: 1. The patient will follow up with Dr. Antoine Poche in [place] on May 1 at     1:15 p.m. 2. The patient is to increase activity slowly.  She may shower, but no     bathing.  No lifting for 1 week greater than 5 pounds.  No driving     for 2 days.  No sexual activity for 1 week.  She is to keep her     cath site clean and dry and call the office for any problems. 3. The patient is to continue on low-sodium heart-healthy diet. 4. The patient is to avoid straining and is to stop any activity that     causes chest pain or shortness of breath. 5. The patient is to call the office in the interim for any problems     or concerns.  DURATION OF DISCHARGE:  Greater than 30 minutes  with physician and physician extender time.     Leonette Monarch, PA-C   ______________________________ Marca Ancona, MD    NB/MEDQ  D:  03/14/2011  T:  03/15/2011  Job:  [id number]  cc:   Hale Bogus, MD, [facility]  Electronically Signed by Alen Blew P.A. on 03/16/2011 12:31:45 PM Electronically Signed by Freida Busman  [facility] MD on 04/13/2011 12:24:57 PM

## 2011-04-18 ENCOUNTER — Other Ambulatory Visit: Payer: Self-pay | Admitting: *Deleted

## 2011-04-18 MED ORDER — DILTIAZEM HCL ER COATED BEADS 120 MG PO CP24: 120.0000 mg | ORAL_CAPSULE | Freq: Every day | ORAL | Status: DC

## 2011-05-18 ENCOUNTER — Telehealth: Payer: Self-pay | Admitting: *Deleted

## 2011-05-18 NOTE — Telephone Encounter (Signed)
Message left on nurse voicemail r/e her protonix being stopped due to an interaction with plavix. Patient started using zantac for her acid reflux and said it is not working and would like to know what she can use for her acid reflux. Patient stated she saw Dr. Dimas Aguas last week.

## 2011-05-18 NOTE — Telephone Encounter (Signed)
Left message to call back on voice mail

## 2011-05-18 NOTE — Telephone Encounter (Signed)
Pt left message at 1050 to return call.   Spoke with pt who state she is having a terrible time w/reflux. She states she saw Dr. Dimas Aguas last week. She states the Protonix was stopped d/t interaction with Plavix. She states she tried Zantac but this is not helping. She states she s/w Dr. Dimas Aguas who stated she needs a PPI but did not prescribe or recommend a medication. Pt notified to discuss with Dr. Dimas Aguas further to have him manage her reflux. Pt verbalized understanding.

## 2011-05-21 ENCOUNTER — Other Ambulatory Visit: Payer: Self-pay | Admitting: Cardiovascular Disease

## 2011-06-05 ENCOUNTER — Ambulatory Visit: Payer: BC Managed Care – PPO | Admitting: Cardiology

## 2011-06-14 ENCOUNTER — Other Ambulatory Visit: Payer: Self-pay | Admitting: Cardiology

## 2011-07-18 ENCOUNTER — Other Ambulatory Visit: Payer: Self-pay | Admitting: *Deleted

## 2011-07-18 DIAGNOSIS — I2510 Atherosclerotic heart disease of native coronary artery without angina pectoris: Secondary | ICD-10-CM

## 2011-07-18 MED ORDER — ISOSORBIDE MONONITRATE ER 30 MG PO TB24: 30.0000 mg | ORAL_TABLET | Freq: Every day | ORAL | Status: DC

## 2011-08-07 ENCOUNTER — Encounter: Payer: Self-pay | Admitting: Cardiology

## 2011-08-07 ENCOUNTER — Ambulatory Visit (INDEPENDENT_AMBULATORY_CARE_PROVIDER_SITE_OTHER): Payer: BC Managed Care – PPO | Admitting: Cardiology

## 2011-08-07 DIAGNOSIS — E669 Obesity, unspecified: Secondary | ICD-10-CM

## 2011-08-07 DIAGNOSIS — F17200 Nicotine dependence, unspecified, uncomplicated: Secondary | ICD-10-CM

## 2011-08-07 DIAGNOSIS — I2510 Atherosclerotic heart disease of native coronary artery without angina pectoris: Secondary | ICD-10-CM

## 2011-08-07 DIAGNOSIS — E785 Hyperlipidemia, unspecified: Secondary | ICD-10-CM

## 2011-08-07 DIAGNOSIS — I10 Essential (primary) hypertension: Secondary | ICD-10-CM

## 2011-08-07 DIAGNOSIS — Z720 Tobacco use: Secondary | ICD-10-CM

## 2011-08-07 MED ORDER — ISOSORBIDE MONONITRATE ER 30 MG PO TB24: 30.0000 mg | ORAL_TABLET | Freq: Every day | ORAL | Status: DC

## 2011-08-07 NOTE — Assessment & Plan Note (Signed)
Unfortunately she cannot exercise.  She is going to follow with Dr. Dimas Aguas or an orthopedist to look into her knee pain.  Until then we have discussed diet changes.

## 2011-08-07 NOTE — Assessment & Plan Note (Signed)
The patient has no new sypmtoms.  No further cardiovascular testing is indicated.  We will continue with aggressive risk reduction and meds as listed.  

## 2011-08-07 NOTE — Assessment & Plan Note (Signed)
I will defer to Dr. Dimas Aguas.  I would suggest a goal, in this young women, of LDL less than 70 or HDL greater than 50.  There are some data to suggest plaque regression with 80 mg of Lipitor.

## 2011-08-07 NOTE — Progress Notes (Signed)
HPI The patient presents for followup of CAD.  Since I last saw her she has done well.  She does occasionally get some chest discomfort. However, this is not similar to the presenting pain she had with her obstructive disease but more similar to pain that she had that was felt possibly to be spasm or nonanginal. She does not take any nitroglycerin. She is unable to be active because of bilateral knee pain. She continues to abstain from cigarettes. She has had no new shortness of breath, PND or orthopnea. She has had no new palpitations, presyncope or syncope.  No Known Allergies  Current Outpatient Prescriptions  Medication Sig Dispense Refill  . acetaminophen (TYLENOL) 325 MG tablet Take 650 mg by mouth every 4 (four) hours as needed.        Marland Kitchen aspirin 81 MG EC tablet Take 81 mg by mouth daily.        Marland Kitchen atorvastatin (LIPITOR) 80 MG tablet Take 80 mg by mouth daily.        . clopidogrel (PLAVIX) 75 MG tablet Take 75 mg by mouth daily.        Marland Kitchen diltiazem (CARDIZEM CD) 120 MG 24 hr capsule Take 1 capsule (120 mg total) by mouth daily.  30 capsule  6  . fexofenadine (ALLEGRA) 180 MG tablet Take 180 mg by mouth daily.        . isosorbide mononitrate (IMDUR) 30 MG 24 hr tablet Take 1 tablet (30 mg total) by mouth daily.  30 tablet  0  . lisinopril (PRINIVIL,ZESTRIL) 5 MG tablet Take 5 mg by mouth daily.        Marland Kitchen LORazepam (ATIVAN) 1 MG tablet Take 1 mg by mouth daily as needed.        . nitroGLYCERIN (NITROSTAT) 0.4 MG SL tablet Place 0.4 mg under the tongue every 5 (five) minutes as needed.        . pantoprazole (PROTONIX) 40 MG tablet Take 40 mg by mouth daily.        . traMADol (ULTRAM) 50 MG tablet Take 1 tablet by mouth Three times daily as needed.      . venlafaxine (EFFEXOR-XR) 75 MG 24 hr capsule Take 75 mg by mouth daily.          Past Medical History  Diagnosis Date  . CAD (coronary artery disease)     PCI Stent RCA 03/12/11.  Cath 03/22/11 LVEF 55-60%.  RCA patent  proximal/distal stents  with 30% proximal disease and 20% mid stenosis.  LCX with 30%  mid stenosis.  Questionable spasm.  Marland Kitchen HTN (hypertension)   . Hyperlipidemia   . Anxiety   . Carotid stenosis     Less than 50% 8/11    Past Surgical History  Procedure Date  . Ectopic pregnancy surgery   . Tonsillectomy and adenoidectomy   . Cholecystectomy     ROS: As stated in the HPI and negative for all other systems.  PHYSICAL EXAM BP 117/78  Pulse 66  Ht 5\' 2"  (1.575 m)  Wt 173 lb (78.472 kg)  BMI 31.64 kg/m2 GENERAL:  Well appearing HEENT:  Pupils equal round and reactive, fundi not visualized, oral mucosa unremarkable NECK:  No jugular venous distention, waveform within normal limits, carotid upstroke brisk and symmetric, no bruits, no thyromegaly LYMPHATICS:  No cervical, inguinal adenopathy LUNGS:  Clear to auscultation bilaterally BACK:  No CVA tenderness CHEST:  Unremarkable HEART:  PMI not displaced or sustained,S1 and S2 within normal limits, no  S3, no S4, no clicks, no rubs, no murmurs ABD:  Flat, positive bowel sounds normal in frequency in pitch, no bruits, no rebound, no guarding, no midline pulsatile mass, no hepatomegaly, no splenomegaly EXT:  2 plus pulses throughout, no edema, no cyanosis no clubbing, Bruising of the right radial catheterization site SKIN:  No rashes no nodules NEURO:  Cranial nerves II through XII grossly intact, motor grossly intact throughout PSYCH:  Cognitively intact, oriented to person place and time  ASSESSMENT AND PLAN

## 2011-08-07 NOTE — Patient Instructions (Signed)
Your physician you to follow up in 1 year. You will receive a reminder letter in the mail one-two months in advance. If you don't receive a letter, please call our office to schedule the follow-up appointment. Your physician recommends that you continue on your current medications as directed. Please refer to the Current Medication list given to you today. 

## 2011-08-07 NOTE — Assessment & Plan Note (Signed)
The blood pressure is at target. No change in medications is indicated. We will continue with therapeutic lifestyle changes (TLC).  

## 2011-08-07 NOTE — Assessment & Plan Note (Signed)
She continues to abstain and I applaud this.

## 2011-09-26 ENCOUNTER — Other Ambulatory Visit (HOSPITAL_COMMUNITY): Payer: Self-pay | Admitting: Chiropractic Medicine

## 2011-09-26 DIAGNOSIS — M25569 Pain in unspecified knee: Secondary | ICD-10-CM

## 2011-09-29 ENCOUNTER — Ambulatory Visit (HOSPITAL_COMMUNITY)
Admission: RE | Admit: 2011-09-29 | Discharge: 2011-09-29 | Disposition: A | Discharge status: Home / Self Care | Payer: BC Managed Care – PPO | Source: Ambulatory Visit | Attending: Chiropractic Medicine | Admitting: Chiropractic Medicine

## 2011-09-29 DIAGNOSIS — M25569 Pain in unspecified knee: Secondary | ICD-10-CM | POA: Insufficient documentation

## 2011-09-29 DIAGNOSIS — M2390 Unspecified internal derangement of unspecified knee: Secondary | ICD-10-CM | POA: Insufficient documentation

## 2011-09-29 DIAGNOSIS — M1710 Unilateral primary osteoarthritis, unspecified knee: Secondary | ICD-10-CM | POA: Insufficient documentation

## 2011-09-29 DIAGNOSIS — IMO0002 Reserved for concepts with insufficient information to code with codable children: Secondary | ICD-10-CM | POA: Insufficient documentation

## 2011-10-24 ENCOUNTER — Ambulatory Visit: Payer: BC Managed Care – PPO | Admitting: Orthopedic Surgery

## 2011-10-31 ENCOUNTER — Ambulatory Visit (INDEPENDENT_AMBULATORY_CARE_PROVIDER_SITE_OTHER): Payer: BC Managed Care – PPO | Admitting: Orthopedic Surgery

## 2011-10-31 ENCOUNTER — Encounter: Payer: Self-pay | Admitting: Orthopedic Surgery

## 2011-10-31 VITALS — BP 160/100 | Ht 62.0 in | Wt 175.0 lb

## 2011-10-31 DIAGNOSIS — M1710 Unilateral primary osteoarthritis, unspecified knee: Secondary | ICD-10-CM | POA: Insufficient documentation

## 2011-10-31 MED ORDER — ACETAMINOPHEN-CODEINE 300-30 MG PO TABS: 1.0000 | ORAL_TABLET | Freq: Four times a day (QID) | ORAL | Status: DC | PRN

## 2011-10-31 NOTE — Patient Instructions (Signed)
PT x 6 weeks   You have received a steroid shot. 15% of patients experience increased pain at the injection site with in the next 24 hours. This is best treated with ice and tylenol extra strength 2 tabs every 8 hours. If you are still having pain please call the office.   See Dr Dimas Aguas for pain medication after the prescription we give you runs out

## 2011-10-31 NOTE — Progress Notes (Signed)
Knee  Injection Procedure Note  Pre-operative Diagnosis: left knee oa  Post-operative Diagnosis: same  Indications: pain  Anesthesia: ethyl chloride   Procedure Details   Verbal consent was obtained for the procedure. Time out was completed.The joint was prepped with alcohol, followed by  Ethyl chloride spray and A 20 gauge needle was inserted into the knee via lateral approach; 4ml 1% lidocaine and 1 ml of depomedrol  was then injected into the joint . The needle was removed and the area cleansed and dressed.  Complications:  None; patient tolerated the procedure well.   Subjective:    [name] is a 55 y.o. female who presents with knee pain involving the left knee. Onset was sudden, related to cardiac rehab, stair climber and treadmill. Mechanism of injury: repetitive use pattern. Inciting event: injured while in cardiac rehab, no specific injury but started then . Current symptoms include: crepitus sensation and swelling. Pain is aggravated by going up and down stairs and bending the knee . Patient has had no prior knee problems. Evaluation to date: plain films: abnormal OA and MRI: abnormal probable torn meniscus - medial . Treatment to date: one cortisone injection in October, tramadol, and home exercise program, along with electrical stimulation, which did not help..  The following portions of the patient's history were reviewed and updated as appropriate: allergies, current medications, past family history, past medical history, past social history, past surgical history and problem list.   Review of Systems A comprehensive review of systems was negative except for: Respiratory: positive for snoring Gastrointestinal: positive for dyspepsia Genitourinary: positive for hematuria Hematologic/lymphatic: positive for easy bruising Behavioral/Psych: positive for depression Allergic/Immunologic: positive for hay fever   Objective:    Ht 5\' 2"  (1.575 m)  Wt 175 lb (79.379 kg)   BMI 32.01 kg/m2 Right knee: no effusion, full active range of motion, no joint line tenderness, ligamentous structures intact.  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 3 for pain.

## 2011-11-08 ENCOUNTER — Other Ambulatory Visit: Payer: Self-pay | Admitting: Cardiology

## 2011-11-13 DIAGNOSIS — R079 Chest pain, unspecified: Secondary | ICD-10-CM

## 2011-12-07 ENCOUNTER — Other Ambulatory Visit: Payer: Self-pay | Admitting: Cardiology

## 2011-12-07 ENCOUNTER — Other Ambulatory Visit (HOSPITAL_COMMUNITY): Payer: Self-pay | Admitting: Urology

## 2011-12-07 DIAGNOSIS — R109 Unspecified abdominal pain: Secondary | ICD-10-CM

## 2011-12-07 NOTE — Telephone Encounter (Signed)
..   Requested Prescriptions   Pending Prescriptions Disp Refills  . atorvastatin (LIPITOR) 80 MG tablet [Pharmacy Med Name: ATORVASTATIN 80 MG TABLET] 30 tablet 11    Sig: TAKE 1 TABLET BY MOUTH EVERY DAY

## 2011-12-11 ENCOUNTER — Ambulatory Visit (HOSPITAL_COMMUNITY)
Admission: RE | Admit: 2011-12-11 | Discharge: 2011-12-11 | Disposition: A | Discharge status: Home / Self Care | Payer: BC Managed Care – PPO | Source: Ambulatory Visit | Attending: Urology | Admitting: Urology

## 2011-12-11 ENCOUNTER — Other Ambulatory Visit (HOSPITAL_COMMUNITY): Payer: BC Managed Care – PPO

## 2011-12-11 ENCOUNTER — Encounter (HOSPITAL_COMMUNITY): Payer: Self-pay

## 2011-12-11 DIAGNOSIS — K5730 Diverticulosis of large intestine without perforation or abscess without bleeding: Secondary | ICD-10-CM | POA: Insufficient documentation

## 2011-12-11 DIAGNOSIS — Q619 Cystic kidney disease, unspecified: Secondary | ICD-10-CM | POA: Insufficient documentation

## 2011-12-11 DIAGNOSIS — R1031 Right lower quadrant pain: Secondary | ICD-10-CM | POA: Insufficient documentation

## 2011-12-11 DIAGNOSIS — N209 Urinary calculus, unspecified: Secondary | ICD-10-CM | POA: Insufficient documentation

## 2011-12-11 DIAGNOSIS — R109 Unspecified abdominal pain: Secondary | ICD-10-CM

## 2011-12-22 ENCOUNTER — Encounter: Payer: BC Managed Care – PPO | Admitting: Cardiovascular Disease

## 2011-12-25 ENCOUNTER — Ambulatory Visit (INDEPENDENT_AMBULATORY_CARE_PROVIDER_SITE_OTHER): Payer: BC Managed Care – PPO | Admitting: Cardiovascular Disease

## 2011-12-25 ENCOUNTER — Encounter: Payer: Self-pay | Admitting: Cardiovascular Disease

## 2011-12-25 DIAGNOSIS — I10 Essential (primary) hypertension: Secondary | ICD-10-CM

## 2011-12-25 DIAGNOSIS — I2510 Atherosclerotic heart disease of native coronary artery without angina pectoris: Secondary | ICD-10-CM

## 2011-12-25 DIAGNOSIS — E785 Hyperlipidemia, unspecified: Secondary | ICD-10-CM

## 2011-12-25 DIAGNOSIS — F17200 Nicotine dependence, unspecified, uncomplicated: Secondary | ICD-10-CM

## 2011-12-25 DIAGNOSIS — Z720 Tobacco use: Secondary | ICD-10-CM

## 2011-12-25 NOTE — Assessment & Plan Note (Signed)
Her recent lipid profile showed an LDL of 111 while she was on Lipitor 10 mg daily. The dose has been increased to 80 mg since then. I recommend a target LDL of less than 70 and HDL above 50. This is being managed by Dr. Dimas Aguas.

## 2011-12-25 NOTE — Patient Instructions (Signed)
Your physician wants you to follow-up in: 6 months. You will receive a reminder letter in the mail one-two months in advance. If you don't receive a letter, please call our office to schedule the follow-up appointment. Stop Lisinopril.

## 2011-12-25 NOTE — Assessment & Plan Note (Signed)
She quit smoking last year.

## 2011-12-25 NOTE — Assessment & Plan Note (Signed)
Patient seems to be stable overall. She had recent atypical chest pain. However evaluation with a nuclear stress test showed no evidence of ischemia. No recurrent chest pain since then. Continue medical therapy. I recommend continuing dual antiplatelet therapy for at least one year. Plavix can likely be discontinued later this year if there are no recurrent cardiac events. Continue treatment with diltiazem and Imdur.

## 2011-12-25 NOTE — Assessment & Plan Note (Signed)
Her blood pressure is well controlled. I will discontinue lisinopril given that at is such a small dose and an ejection fraction is normal.

## 2011-12-25 NOTE — Progress Notes (Signed)
HPI  This is a 56 year old female who is here today for a followup visit. She has known history of coronary artery disease status post inferior myocardial infarction in April of 2012. She had 2 drug-eluting stent placements at that time. She had minor recurrent chest pain since then. Cardiac catheterization few weeks after stent placement showed no obstructive disease. She was suspected of having coronary spasm and has been treated with calcium channel blocker and long-acting nitroglycerin. Her ejection fraction is known to be normal. She presented in December of 2012 to [facility] with atypical chest pain. She was admitted for observation. She ruled out for myocardial infarction. She underwent a nuclear stress test which showed no evidence of ischemia. She has not had any chest pain since her hospital discharge. She has been under significant stress at home. She has back problems and has not been able to return to work. This has caused some financial difficulties.  No Known Allergies   Current Outpatient Prescriptions on File Prior to Visit  Medication Sig Dispense Refill  . Acetaminophen-Codeine (TYLENOL/CODEINE #3) 300-30 MG per tablet Take 1 tablet by mouth every 6 (six) hours as needed for pain.  60 tablet  2  . aspirin 81 MG EC tablet Take 81 mg by mouth daily.        Marland Kitchen atorvastatin (LIPITOR) 80 MG tablet TAKE 1 TABLET BY MOUTH EVERY DAY  30 tablet  11  . clopidogrel (PLAVIX) 75 MG tablet Take 75 mg by mouth daily.        Marland Kitchen diltiazem (CARDIZEM CD) 120 MG 24 hr capsule TAKE 1 CAPSULE BY MOUTH DAILY  30 capsule  6  . fexofenadine (ALLEGRA) 180 MG tablet Take 180 mg by mouth daily.        . isosorbide mononitrate (IMDUR) 30 MG 24 hr tablet Take 1 tablet (30 mg total) by mouth daily.  30 tablet  12  . nitroGLYCERIN (NITROSTAT) 0.4 MG SL tablet Place 0.4 mg under the tongue every 5 (five) minutes as needed.        . pantoprazole (PROTONIX) 40 MG tablet Take 40 mg by mouth daily.       Marland Kitchen  venlafaxine (EFFEXOR-XR) 75 MG 24 hr capsule Take 75 mg by mouth daily. Takes name brand effexor         Past Medical History  Diagnosis Date  . Hyperlipidemia   . Anxiety   . Carotid stenosis     Less than 50% 8/11  . CAD (coronary artery disease)     PCI Stent RCA 03/12/11.  Cath 03/22/11 LVEF 55-60%.  RCA patent  proximal/distal stents with 30% proximal disease and 20% mid stenosis.  LCX with 30%  mid stenosis.  Questionable spasm.  Marland Kitchen HTN (hypertension)      Past Surgical History  Procedure Date  . Ectopic pregnancy surgery   . Tonsillectomy and adenoidectomy   . Cholecystectomy   . Left wrist   . Heart stent   . Cyst removed from spine      Family History  Problem Relation Age of Onset  . Heart disease    . Arthritis    . Cancer       History   Social History  . Marital Status: Married    Spouse Name: N/A    Number of Children: N/A  . Years of Education: N/A   Occupational History  . Not on file.   Social History Main Topics  . Smoking status: Former Smoker -- 1.0  packs/day for 10 years    Types: Cigarettes    Quit date: 11/27/2010  . Smokeless tobacco: Never Used  . Alcohol Use: Yes  . Drug Use: No  . Sexually Active: Not on file   Other Topics Concern  . Not on file   Social History Narrative  . No narrative on file     PHYSICAL EXAM   BP 129/85  Pulse 83  Ht 5\' 2"  (1.575 m)  Wt 174 lb (78.926 kg)  BMI 31.83 kg/m2  Constitutional: She is oriented to person, place, and time. She appears well-developed and well-nourished. No distress.  HENT: No nasal discharge.  Head: Normocephalic and atraumatic.  Eyes: Pupils are equal, round, and reactive to light. Right eye exhibits no discharge. Left eye exhibits no discharge.  Neck: Normal range of motion. Neck supple. No JVD present. No thyromegaly present.  Cardiovascular: Normal rate, regular rhythm, normal heart sounds and intact distal pulses. Exam reveals no gallop and no friction rub.  No  murmur heard.  Pulmonary/Chest: Effort normal and breath sounds normal. No stridor. No respiratory distress. She has no wheezes. She has no rales. She exhibits no tenderness.  Abdominal: Soft. Bowel sounds are normal. She exhibits no distension. There is no tenderness. There is no rebound and no guarding.  Musculoskeletal: Normal range of motion. She exhibits no edema and no tenderness.  Neurological: She is alert and oriented to person, place, and time. Coordination normal.  Skin: Skin is warm and dry. No rash noted. She is not diaphoretic. No erythema. No pallor.  Psychiatric: She has a normal mood and affect. Her behavior is normal. Judgment and thought content normal.      ASSESSMENT AND PLAN

## 2012-03-18 ENCOUNTER — Telehealth: Payer: Self-pay | Admitting: *Deleted

## 2012-03-18 NOTE — Telephone Encounter (Signed)
PA Chadwell with [facility] would like pt to hold Plavix for knee arthroscopy. She can remain on ASA.

## 2012-03-18 NOTE — Telephone Encounter (Signed)
It is fine to hold Plavix for 1 week before surgery and resume after.

## 2012-03-19 NOTE — Telephone Encounter (Signed)
Note faxed to PA Chadwell.

## 2012-03-20 NOTE — Telephone Encounter (Signed)
Message left at [phone] by [facility] in [place] stating pt has surgery scheduled for 03/27/12. She states in message that per pt she was approved by Dr. Kirke Corin to stop Plavix but they didn't know about ASA. They wanted to know if it was okay to stop ASA. She also requested note w/ok to hold med be faxed to them.  Returned call and left message stating RN had spoke to PA-Josh Chadwell at [facility] who requested clearance for pt to hold Plavix. PA Chadwell specifically stating during conversation w/RN that pt could remain on ASA. Dr. Kirke Corin did give ok for pt to hold Plavix and this was faxed to [facility] on 4/23. This note will be faxed to surgical center for their records.

## 2012-04-26 ENCOUNTER — Other Ambulatory Visit: Payer: Self-pay

## 2012-06-10 ENCOUNTER — Encounter: Payer: Self-pay | Admitting: Cardiology

## 2012-06-10 ENCOUNTER — Ambulatory Visit (INDEPENDENT_AMBULATORY_CARE_PROVIDER_SITE_OTHER): Payer: BC Managed Care – PPO | Admitting: Cardiology

## 2012-06-10 VITALS — BP 162/99 | HR 75 | Ht 62.0 in | Wt 178.4 lb

## 2012-06-10 DIAGNOSIS — I10 Essential (primary) hypertension: Secondary | ICD-10-CM

## 2012-06-10 DIAGNOSIS — Z79899 Other long term (current) drug therapy: Secondary | ICD-10-CM

## 2012-06-10 DIAGNOSIS — Z720 Tobacco use: Secondary | ICD-10-CM

## 2012-06-10 DIAGNOSIS — I2510 Atherosclerotic heart disease of native coronary artery without angina pectoris: Secondary | ICD-10-CM

## 2012-06-10 DIAGNOSIS — E785 Hyperlipidemia, unspecified: Secondary | ICD-10-CM

## 2012-06-10 DIAGNOSIS — F17200 Nicotine dependence, unspecified, uncomplicated: Secondary | ICD-10-CM

## 2012-06-10 DIAGNOSIS — I779 Disorder of arteries and arterioles, unspecified: Secondary | ICD-10-CM

## 2012-06-10 DIAGNOSIS — E669 Obesity, unspecified: Secondary | ICD-10-CM

## 2012-06-10 MED ORDER — ROSUVASTATIN CALCIUM 20 MG PO TABS: 20.0000 mg | ORAL_TABLET | Freq: Every day | ORAL | Status: DC

## 2012-06-10 NOTE — Progress Notes (Signed)
HPI  This is a 56 year old female who is here today for a followup visit. She has known history of coronary artery disease status post inferior myocardial infarction in April of 2012. She had 2 drug-eluting stent placements at that time. She had recurrent chest pain with cardiac catheterization not long after this.  She had patent stents. She was suspected of having coronary spasm and has been treated with calcium channel blocker and long-acting nitroglycerin. Her ejection fraction is known to be normal. In December of 2012 to [facility] with atypical chest pain. She was admitted for observation. She ruled out for myocardial infarction. She underwent a nuclear stress test which showed no evidence of ischemia.   Since I last saw her she has done well.   She did have knee surgery and is having a slow recovery from this. The patient denies any new symptoms such as chest discomfort, neck or arm discomfort. There has been no new shortness of breath, PND or orthopnea. There have been no reported palpitations, presyncope or syncope.   Unfortunately she's not able walk as much as I would like because of the knee problems.   No Known Allergies  Current Outpatient Prescriptions  Medication Sig Dispense Refill  . aspirin 81 MG EC tablet Take 81 mg by mouth daily.        . clopidogrel (PLAVIX) 75 MG tablet Take 75 mg by mouth daily.        . CRESTOR 5 MG tablet Take 5 mg by mouth daily.       . CYMBALTA 30 MG capsule Take 30 mg by mouth daily.       Marland Kitchen diltiazem (CARDIZEM CD) 120 MG 24 hr capsule TAKE 1 CAPSULE BY MOUTH DAILY  30 capsule  6  . fexofenadine (ALLEGRA) 180 MG tablet Take 180 mg by mouth daily.        . fish oil-omega-3 fatty acids 1000 MG capsule Take 1 g by mouth daily.      . isosorbide mononitrate (IMDUR) 30 MG 24 hr tablet Take 1 tablet (30 mg total) by mouth daily.  30 tablet  12  . Multiple Vitamin (MULTIVITAMIN) tablet Take 1 tablet by mouth daily.      . nitroGLYCERIN  (NITROSTAT) 0.4 MG SL tablet Place 0.4 mg under the tongue every 5 (five) minutes as needed.        . NON FORMULARY Insta Flex joint supplement 1 tablet once daily.      Marland Kitchen oxyCODONE-acetaminophen (PERCOCET) 5-325 MG per tablet Take 1 tablet by mouth 2 (two) times daily as needed.       . pantoprazole (PROTONIX) 40 MG tablet Take 40 mg by mouth daily.       Marland Kitchen zolpidem (AMBIEN) 10 MG tablet Take 1 tablet by mouth at bedtime.        Past Medical History  Diagnosis Date  . Hyperlipidemia   . Anxiety   . Carotid stenosis     Less than 50% 8/11  . CAD (coronary artery disease)     PCI Stent RCA 03/12/11.  Cath 03/22/11 LVEF 55-60%.  RCA patent  proximal/distal stents with 30% proximal disease and 20% mid stenosis.  LCX with 30%  mid stenosis.  Questionable spasm.  Marland Kitchen HTN (hypertension)     Past Surgical History  Procedure Date  . Ectopic pregnancy surgery   . Tonsillectomy and adenoidectomy   . Cholecystectomy   . Left wrist   . Heart stent   . Cyst  removed from spine   . Left knee     Arthroscopic    ROS: As stated in the HPI and negative for all other systems.  PHYSICAL EXAM BP 162/99  Pulse 75  Ht 5\' 2"  (1.575 m)  Wt 178 lb 6.4 oz (80.922 kg)  BMI 32.63 kg/m2  SpO2 95% GENERAL:  Well appearing HEENT:  Pupils equal round and reactive, fundi not visualized, oral mucosa unremarkable NECK:  No jugular venous distention, waveform within normal limits, carotid upstroke brisk and symmetric, no bruits, no thyromegaly LYMPHATICS:  No cervical, inguinal adenopathy LUNGS:  Clear to auscultation bilaterally BACK:  No CVA tenderness CHEST:  Unremarkable HEART:  PMI not displaced or sustained,S1 and S2 within normal limits, no S3, no S4, no clicks, no rubs, no murmurs ABD:  Flat, positive bowel sounds normal in frequency in pitch, no bruits, no rebound, no guarding, no midline pulsatile mass, no hepatomegaly, no splenomegaly EXT:  2 plus pulses throughout, no edema, no cyanosis no  clubbing, Bruising of the right radial catheterization site   ASSESSMENT AND PLAN   CAD (coronary artery disease) -  She's having no new cardiovascular complaints. She will continue with aggressive risk reduction.   HTN (hypertension) -  Her blood pressure is slightly elevated here. However, it has not been home and I instructed her to take her blood pressure diary.   Hyperlipidemia -  She couldn't tolerate Lipitor and 80 mg.   She is now on Crestor 5 mg daily.  Her LDL on this was 129.  I will increase Crestor to 20 mg and repeat the lipid in 8 weeks.  Tobacco abuse -  She quit smoking last year.   She does occasionally use electronic cigarettes I advised against this.   Carotid stenosis -  She needs carotid Dopplers in about 8 weeks. She had 50% stenosis in 2011.

## 2012-06-10 NOTE — Patient Instructions (Addendum)
Your physician recommends that you schedule a follow-up appointment in:1 year with Dr. Antoine Poche. You will receive a reminder letter in the mail in about 10 months reminding you to call and schedule your appointment. If you don't receive this letter, please contact our office.   Your physician has recommended you make the following change in your medication: stop plavix.  Increase crestor to 20 mg daily. You may take (4) of your 5 mg tablets until they finish.   Your physician has requested that you have a carotid duplex August 2013. This test is an ultrasound of the carotid arteries in your neck. It looks at blood flow through these arteries that supply the brain with blood. Allow one hour for this exam. There are no restrictions or special instructions.   Your physician recommends that you return for a FASTING lipid/liver profile: at [facility] in 8 weeks around 08/05/12.  Your physician has requested that you regularly monitor and record your blood pressure readings at home. Please use the same machine at the same time of day to check your readings and record them to bring to your follow-up visit.

## 2012-07-10 ENCOUNTER — Encounter (INDEPENDENT_AMBULATORY_CARE_PROVIDER_SITE_OTHER): Payer: BC Managed Care – PPO

## 2012-07-10 DIAGNOSIS — I6529 Occlusion and stenosis of unspecified carotid artery: Secondary | ICD-10-CM

## 2012-07-10 DIAGNOSIS — I779 Disorder of arteries and arterioles, unspecified: Secondary | ICD-10-CM

## 2012-07-18 ENCOUNTER — Telehealth: Payer: Self-pay | Admitting: *Deleted

## 2012-07-18 NOTE — Telephone Encounter (Signed)
Message copied by Eustace Moore on Thu Jul 18, 2012  4:54 PM ------      Message from: Rollene Rotunda      Created: Tue Jul 16, 2012  8:30 PM       Very mild plaque.  Follow up in two years.  Call Ms. Tilghman with the results and send results to Selinda Flavin, MD

## 2012-07-18 NOTE — Telephone Encounter (Signed)
Left message for patient to call office.  

## 2012-07-19 NOTE — Telephone Encounter (Signed)
Patient informed. 

## 2012-07-19 NOTE — Telephone Encounter (Signed)
Left message with husband Virl Diamond for patient to call office.

## 2012-08-29 ENCOUNTER — Telehealth: Payer: Self-pay | Admitting: *Deleted

## 2012-08-29 NOTE — Telephone Encounter (Signed)
[name] returned your call this afternoon.

## 2012-08-30 ENCOUNTER — Telehealth: Payer: Self-pay | Admitting: *Deleted

## 2012-08-30 NOTE — Telephone Encounter (Signed)
Patient informed and copy sent to PCP office. 

## 2012-08-30 NOTE — Telephone Encounter (Signed)
Returned call see new phone note for details.

## 2012-08-30 NOTE — Telephone Encounter (Signed)
Message copied by Eustace Moore on Fri Aug 30, 2012  9:13 AM ------      Message from: Rollene Rotunda      Created: Thu Aug 29, 2012  7:44 AM       Cholesterol profile is OK.  No change in therapy.  Call Ms. Greenfield with the results and send results to Selinda Flavin, MD

## 2012-10-14 ENCOUNTER — Encounter (INDEPENDENT_AMBULATORY_CARE_PROVIDER_SITE_OTHER): Payer: Self-pay

## 2012-12-04 ENCOUNTER — Encounter: Payer: Self-pay | Admitting: *Deleted

## 2013-01-06 ENCOUNTER — Other Ambulatory Visit: Payer: Self-pay | Admitting: Cardiology

## 2013-01-06 NOTE — Telephone Encounter (Signed)
..   Requested Prescriptions   Pending Prescriptions Disp Refills  . CRESTOR 20 MG tablet [Pharmacy Med Name: CRESTOR 20 MG TABLET] 30 tablet 6    Sig: TAKE 1 TABLET BY MOUTH EVERY DAY

## 2013-04-25 ENCOUNTER — Encounter: Payer: Self-pay | Admitting: Cardiology

## 2013-04-25 ENCOUNTER — Ambulatory Visit (INDEPENDENT_AMBULATORY_CARE_PROVIDER_SITE_OTHER): Payer: BC Managed Care – PPO | Admitting: Cardiology

## 2013-04-25 VITALS — BP 135/89 | HR 81 | Ht 62.0 in | Wt 183.0 lb

## 2013-04-25 DIAGNOSIS — I2510 Atherosclerotic heart disease of native coronary artery without angina pectoris: Secondary | ICD-10-CM

## 2013-04-25 DIAGNOSIS — I10 Essential (primary) hypertension: Secondary | ICD-10-CM

## 2013-04-25 NOTE — Patient Instructions (Signed)
Continue all current medications. Your physician wants you to follow up in:  1 year.  You will receive a reminder letter in the mail one-two months in advance.  If you don't receive a letter, please call our office to schedule the follow up appointment   

## 2013-04-25 NOTE — Progress Notes (Signed)
HPI  This is a 57 year old female who is here today for a followup visit. She has known history of coronary artery disease status post inferior myocardial infarction in April of 2012. She had 2 drug-eluting stent placements at that time. She had recurrent chest pain with cardiac catheterization not long after this.  She had patent stents. She was suspected of having coronary spasm and has been treated with calcium channel blocker and long-acting nitroglycerin. Her ejection fraction is known to be normal. In December of 2012 to [facility] with atypical chest pain. She was admitted for observation. She ruled out for myocardial infarction. She underwent a nuclear stress test which showed no evidence of ischemia.   Since I last saw her she's had no new cardiovascular complaints. Her blood pressure was elevated so lisinopril HCT was added to her regimen. Unfortunately she's been very limited by back pain. She recovered from knee surgery but still has discomfort with this. She had a severe headache requiring MRI. 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  The patient denies any new symptoms such as chest discomfort, neck or arm discomfort. There has been no new shortness of breath, PND or orthopnea. There have been no reported palpitations, presyncope or syncope..   No Known Allergies  Current Outpatient Prescriptions  Medication Sig Dispense Refill  . aspirin 81 MG EC tablet Take 81 mg by mouth daily.        Marland Kitchen diltiazem (CARDIZEM CD) 120 MG 24 hr capsule TAKE 1 CAPSULE BY MOUTH DAILY  30 capsule  6  . fexofenadine (ALLEGRA) 180 MG tablet Take 180 mg by mouth daily.        . fish oil-omega-3 fatty acids 1000 MG capsule Take 1 g by mouth daily.      Marland Kitchen HYDROcodone-acetaminophen (NORCO/VICODIN) 5-325 MG per tablet Take 1 tablet by mouth every 6 (six) hours as needed for pain.      . isosorbide mononitrate (IMDUR) 30 MG 24 hr tablet Take 1 tablet (30 mg total) by mouth daily.  30 tablet  12  . Multiple  Vitamin (MULTIVITAMIN) tablet Take 1 tablet by mouth daily.      . nitroGLYCERIN (NITROSTAT) 0.4 MG SL tablet Place 0.4 mg under the tongue every 5 (five) minutes as needed.        . pantoprazole (PROTONIX) 40 MG tablet Take 40 mg by mouth daily.       Marland Kitchen venlafaxine XR (EFFEXOR-XR) 75 MG 24 hr capsule       . zolpidem (AMBIEN) 10 MG tablet Take 1 tablet by mouth at bedtime.       No current facility-administered medications for this visit.    Past Medical History  Diagnosis Date  . Hyperlipidemia   . Anxiety   . Carotid stenosis     Less than 50% 8/11  . CAD (coronary artery disease)     PCI Stent RCA 03/12/11.  Cath 03/22/11 LVEF 55-60%.  RCA patent  proximal/distal stents with 30% proximal disease and 20% mid stenosis.  LCX with 30%  mid stenosis.  Questionable spasm.  Marland Kitchen HTN (hypertension)     Past Surgical History  Procedure Laterality Date  . Ectopic pregnancy surgery    . Tonsillectomy and adenoidectomy    . Cholecystectomy    . Left wrist    . Heart stent    . Cyst removed from spine    . Left knee      Arthroscopic    ROS: As stated in  the HPI and negative for all other systems.  PHYSICAL EXAM BP 135/89  Pulse 81  Ht 5\' 2"  (1.575 m)  Wt 183 lb (83.008 kg)  BMI 33.46 kg/m2 GENERAL:  Well appearing HEENT:  Pupils equal round and reactive, fundi not visualized, oral mucosa unremarkable NECK:  No jugular venous distention, waveform within normal limits, carotid upstroke brisk and symmetric, no bruits, no thyromegaly LYMPHATICS:  No cervical, inguinal adenopathy LUNGS:  Clear to auscultation bilaterally BACK:  No CVA tenderness CHEST:  Unremarkable HEART:  PMI not displaced or sustained,S1 and S2 within normal limits, no S3, no S4, no clicks, no rubs, no murmurs ABD:  Flat, positive bowel sounds normal in frequency in pitch, no bruits, no rebound, no guarding, no midline pulsatile mass, no hepatomegaly, no splenomegaly EXT:  2 plus pulses throughout, no edema, no  cyanosis no clubbing, Bruising of the right radial catheterization site  EKG:  Normal sinus rhythm, rate 81, axis within normal limits, intervals within normal limits, old inferior MI, low-voltage in the precordial leads, poor anterior R wave progression, possible old anteroseptal infarct, no acute ST-T wave changes.  04/25/2013  ASSESSMENT AND PLAN   CAD (coronary artery disease) -  She's having no new cardiovascular complaints. She will continue with aggressive risk reduction.  HTN (hypertension) -  Her blood pressure is well controlled with the addition of lisinopril. No change in therapy is indicated.  Hyperlipidemia -  She has been intolerant of all statins with muscle aches.  Tobacco abuse -  She quit smoking last year.     Carotid stenosis -  This was less then 40% bilateral. No change in therapy is indicated.

## 2013-09-03 ENCOUNTER — Telehealth: Payer: Self-pay | Admitting: *Deleted

## 2013-09-03 NOTE — Telephone Encounter (Signed)
Patient states she brought form to [place] office 3 weeks ago to have completed so that she can be set up for left knee surgery. Patient said her orthopedic office is still waiting for cardiology clearance. Per Patient, the form was faxed to the [facility] Office 3 weeks ago.

## 2013-09-03 NOTE — Telephone Encounter (Signed)
I spoke with the patient. Despite her limitations she has a reasonable functional level (greater than 5 METS).  She has had no symptoms since her stress test in 2012  Therefore, based on ACC/AHA guidelines, the patient would be at acceptable risk for the planned procedure without further cardiovascular testing.

## 2013-09-08 ENCOUNTER — Telehealth: Payer: Self-pay | Admitting: Cardiology

## 2013-09-08 NOTE — Telephone Encounter (Signed)
New message   Dr Christian Mate in [place] has not received clearance for knee replacement surgery

## 2013-09-08 NOTE — Telephone Encounter (Signed)
Pt aware information has been faxed and sent electronically to Dr Madelon Lips

## 2013-09-16 ENCOUNTER — Other Ambulatory Visit: Payer: Self-pay | Admitting: Physician Assistant

## 2013-09-16 ENCOUNTER — Encounter (HOSPITAL_COMMUNITY): Payer: Self-pay | Admitting: Pharmacy Technician

## 2013-09-18 ENCOUNTER — Encounter (HOSPITAL_COMMUNITY)
Admission: RE | Admit: 2013-09-18 | Discharge: 2013-09-18 | Disposition: A | Discharge status: Home / Self Care | Payer: BC Managed Care – PPO | Source: Ambulatory Visit | Attending: Orthopedic Surgery | Admitting: Orthopedic Surgery

## 2013-09-18 ENCOUNTER — Encounter (HOSPITAL_COMMUNITY): Payer: Self-pay

## 2013-09-18 ENCOUNTER — Ambulatory Visit (HOSPITAL_COMMUNITY)
Admission: RE | Admit: 2013-09-18 | Discharge: 2013-09-18 | Disposition: A | Discharge status: Home / Self Care | Payer: BC Managed Care – PPO | Source: Ambulatory Visit | Attending: Physician Assistant | Admitting: Physician Assistant

## 2013-09-18 DIAGNOSIS — I517 Cardiomegaly: Secondary | ICD-10-CM | POA: Insufficient documentation

## 2013-09-18 DIAGNOSIS — Z01818 Encounter for other preprocedural examination: Secondary | ICD-10-CM | POA: Insufficient documentation

## 2013-09-18 DIAGNOSIS — Z01812 Encounter for preprocedural laboratory examination: Secondary | ICD-10-CM | POA: Insufficient documentation

## 2013-09-18 HISTORY — DX: Personal history of other diseases of the digestive system: Z87.19

## 2013-09-18 LAB — CBC WITH DIFFERENTIAL/PLATELET
Basophils Absolute: 0.1 K/uL (ref 0.0–0.1)
Basophils Relative: 1 % (ref 0–1)
Eosinophils Absolute: 0.1 K/uL (ref 0.0–0.7)
Eosinophils Relative: 2 % (ref 0–5)
HCT: 41.2 % (ref 36.0–46.0)
Hemoglobin: 13.9 g/dL (ref 12.0–15.0)
Lymphocytes Relative: 39 % (ref 12–46)
Lymphs Abs: 2.6 K/uL (ref 0.7–4.0)
MCH: 30.5 pg (ref 26.0–34.0)
MCHC: 33.7 g/dL (ref 30.0–36.0)
MCV: 90.5 fL (ref 78.0–100.0)
Monocytes Absolute: 0.6 K/uL (ref 0.1–1.0)
Monocytes Relative: 9 % (ref 3–12)
Neutro Abs: 3.3 K/uL (ref 1.7–7.7)
Neutrophils Relative %: 50 % (ref 43–77)
Platelets: 328 K/uL (ref 150–400)
RBC: 4.55 MIL/uL (ref 3.87–5.11)
RDW: 13.3 % (ref 11.5–15.5)
WBC: 6.7 K/uL (ref 4.0–10.5)

## 2013-09-18 LAB — URINALYSIS, ROUTINE W REFLEX MICROSCOPIC
Bilirubin Urine: NEGATIVE
Hgb urine dipstick: NEGATIVE
Leukocytes, UA: NEGATIVE
Specific Gravity, Urine: 1.007 (ref 1.005–1.030)
Urobilinogen, UA: 0.2 mg/dL (ref 0.0–1.0)
pH: 6.5 (ref 5.0–8.0)

## 2013-09-18 LAB — COMPREHENSIVE METABOLIC PANEL
AST: 21 U/L (ref 0–37)
Albumin: 4.2 g/dL (ref 3.5–5.2)
Alkaline Phosphatase: 82 U/L (ref 39–117)
BUN: 8 mg/dL (ref 6–23)
CO2: 27 mEq/L (ref 19–32)
Chloride: 100 mEq/L (ref 96–112)
Creatinine, Ser: 0.79 mg/dL (ref 0.50–1.10)
GFR calc Af Amer: >90 mL/min (ref >90)
GFR calc non Af Amer: >90 mL/min (ref >90)
Glucose, Bld: 92 mg/dL (ref 70–99)
Potassium: 3.7 mEq/L (ref 3.5–5.1)
Sodium: 139 mEq/L (ref 135–145)
Total Bilirubin: 0.3 mg/dL (ref 0.3–1.2)
Total Protein: 8 g/dL (ref 6.0–8.3)

## 2013-09-18 LAB — SURGICAL PCR SCREEN
MRSA, PCR: NEGATIVE
Staphylococcus aureus: NEGATIVE

## 2013-09-18 LAB — PROTIME-INR
INR: 0.95 (ref 0.00–1.49)
Prothrombin Time: 12.5 s (ref 11.6–15.2)

## 2013-09-18 LAB — APTT: aPTT: 31 seconds (ref 24–37)

## 2013-09-18 NOTE — Pre-Procedure Instructions (Addendum)
[name]  09/18/2013   Your procedure is scheduled on: Friday, October 31st.   Report to [facility]  2 * 3 at 5:30 AM.   Call this number if you have problems the morning of surgery: [phone]   Remember:   Do not eat food or drink liquids after midnight Thursday.   Take these medicines the morning of surgery with A SIP OF WATER: Hydrocodone, Imdur, Protonix, Effexor   Do not wear jewelry, make-up or nail polish.  Do not wear lotions, powders, or perfumes. You may wear deodorant.  Do not shave 48 hours prior to surgery.                                                                                                                                                                                                                                                                                                                              Contacts, dentures or bridgework may not be worn into surgery.  Leave suitcase in the car. After surgery it may be brought to your room.   For patients admitted to the hospital, discharge time is determined by your treatment team.             DO NOT BRING ANY VALUABLES--[facility] IS NOT RESPONSIBLE FOR THESE.               Name and phone number of your driver:   Special Instructions: Shower using CHG 2 nights before surgery and the night before surgery.  If you shower the day of surgery use CHG.  Use special wash - you have one bottle of CHG for all showers.  You should use approximately 1/3 of the bottle for each shower. N/A   Please read over the following fact sheets that you were given: Pain Booklet, Coughing and Deep Breathing, Blood Transfusion Information, MRSA Information and Surgical Site Infection Prevention

## 2013-09-18 NOTE — Progress Notes (Addendum)
I requested sleep study results from [facility].  (it was Normal)  DA  Pt sees Dr. Antoine Poche now.  Had had 2 stents placed by Dr. Kirke Corin in 2012?  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  Will order for DOS  DA

## 2013-09-19 LAB — URINE CULTURE
Colony Count: NO GROWTH
Culture: NO GROWTH

## 2013-09-19 NOTE — Progress Notes (Signed)
Anesthesia Chart Review:  Patient is a 57 year old female scheduled for left TKA on 09/26/13 by Dr. Madelon Lips.  History includes former smoker, obesity, CAD/inferior STEMI 03/12/11 s/p DES to proximal RCA X 2, HLD, HTN, anxiety, hiatal hernia, 0-39% bilateral ICA stenosis by duplex on 07/10/12.  PCP is Dr. Selinda Flavin.  Cardiologist is Dr. Rollene Rotunda.  He is aware of plans for surgery and felt she was "acceptable risk."  EKG on 04/25/13 showed SR, old anterior infarct, non-specific T wave abnormality.  Cardiac cath on 03/20/11 showed: 1. Patent stents in the right coronary artery (placed 03/12/11).  2. Possible coronary spasm given her symptoms of chest pain and associated with inferior ST segment changes.  3. No significant disease in the LAD and left circumflex arteries.  4. Normal LV systolic function with mildly elevated left ventricular end-diastolic pressure. EF 55-60%.  According to Dr. Jari Sportsman 12/25/11 office note, she also had a non-ischemic stress test at [facility] in 10/2011.  I will request report.  Echo on 03/13/11 showed mild LVH, mild focal basal hypertrophy of the septum, normal LV systolic function, EF 55-60%. Trivial TR.  Preoperative CXR and labs noted.  She is for a T&S on the day of surgery.  If there are no acute changes then I would anticipate that she could proceed as planned.  Velna Ochs [facility] Short Stay Center/Anesthesiology Phone [phone] 09/19/2013 5:46 PM

## 2013-09-25 MED ORDER — CEFAZOLIN SODIUM-DEXTROSE 2-3 GM-% IV SOLR
2.0000 g | INTRAVENOUS | Status: AC
  Administered 2013-09-26: 2 g via INTRAVENOUS
  Filled 2013-09-25: qty 50

## 2013-09-25 NOTE — H&P (Signed)
TOTAL KNEE ADMISSION H&P  Patient is being admitted for left total knee arthroplasty.  Subjective:  Chief Complaint:left knee pain.  HPI: [name], 57 y.o. female, has a history of pain and functional disability in the left knee due to arthritis and has failed non-surgical conservative treatments for greater than 12 weeks to includeNSAID's and/or analgesics, corticosteriod injections, viscosupplementation injections, flexibility and strengthening excercises, use of assistive devices and activity modification.  Onset of symptoms was gradual, starting 3 years ago with gradually worsening course since that time. The patient noted prior procedures on the knee to include  arthroscopy and menisectomy on the left knee(s).  Patient currently rates pain in the left knee(s) at 10 out of 10 with activity. Patient has night pain, worsening of pain with activity and weight bearing, pain that interferes with activities of daily living, pain with passive range of motion, crepitus and joint swelling.  Patient has evidence of joint space narrowing by imaging studies. . There is no active infection.  Patient Active Problem List   Diagnosis Date Noted  . Arthritis of knee 10/31/2011  . Tobacco abuse 03/28/2011  . Obesity 03/28/2011  . CAD (coronary artery disease)   . HTN (hypertension)   . Hyperlipidemia   . Anxiety    Past Medical History  Diagnosis Date  . Hyperlipidemia   . Anxiety   . Carotid stenosis     Less than 50% 8/11  . CAD (coronary artery disease)     PCI Stent RCA 03/12/11.  Cath 03/22/11 LVEF 55-60%.  RCA patent  proximal/distal stents with 30% proximal disease and 20% mid stenosis.  LCX with 30%  mid stenosis.  Questionable spasm.  Marland Kitchen HTN (hypertension)   . H/O hiatal hernia     Past Surgical History  Procedure Laterality Date  . Ectopic pregnancy surgery    . Tonsillectomy and adenoidectomy    . Cholecystectomy    . Left wrist    . Heart stent    . Cyst removed from spine      . Left knee      Arthroscopic  . Tubal ligation       (Not in a hospital admission) Allergies  Allergen Reactions  . Sulfa Antibiotics Itching, Rash and Other (See Comments)    "severe muscle cramps, tongue cracked, dry mouth "    History  Substance Use Topics  . Smoking status: Former Smoker -- 1.00 packs/day for 10 years    Types: Cigarettes    Quit date: 11/27/2010  . Smokeless tobacco: Never Used  . Alcohol Use: Yes     Comment: social    Family History  Problem Relation Age of Onset  . Heart disease    . Arthritis    . Cancer       Review of Systems  Constitutional: Negative.   HENT: Negative.   Eyes: Negative.   Respiratory: Positive for shortness of breath. Negative for cough, hemoptysis, sputum production and wheezing.   Cardiovascular: Positive for chest pain. Negative for palpitations, orthopnea, claudication, leg swelling and PND.  Gastrointestinal: Negative.   Genitourinary: Positive for dysuria and hematuria. Negative for urgency, frequency and flank pain.  Musculoskeletal: Positive for joint pain and myalgias. Negative for falls and neck pain.  Skin: Negative.   Neurological: Negative.   Endo/Heme/Allergies: Negative.   Psychiatric/Behavioral: Negative.     Objective:  Physical Exam  Constitutional: She is oriented to person, place, and time. She appears well-developed and well-nourished. No distress.  HENT:  Head: Normocephalic and atraumatic.  Nose: Nose normal.  Eyes: Conjunctivae and EOM are normal. Pupils are equal, round, and reactive to light.  Neck: Normal range of motion. Neck supple.  Cardiovascular: Normal rate, regular rhythm, normal heart sounds and intact distal pulses.   No murmur heard. Respiratory: Effort normal and breath sounds normal. No respiratory distress. She has no wheezes. She exhibits no tenderness.  GI: Soft. Bowel sounds are normal. She exhibits no distension. There is no tenderness.  Musculoskeletal:       Left  knee: She exhibits decreased range of motion, swelling and effusion. She exhibits no laceration, no erythema, no LCL laxity and no MCL laxity. Tenderness found. Medial joint line and lateral joint line tenderness noted.  Lymphadenopathy:    She has no cervical adenopathy.  Neurological: She is alert and oriented to person, place, and time. No cranial nerve deficit.  Skin: Skin is warm and dry. No erythema.     Psychiatric: She has a normal mood and affect. Her behavior is normal.    Vital signs in last 24 hours: @VSRANGES @  Labs:   Estimated body mass index is 33.46 kg/(m^2) as calculated from the following:   Height as of 04/25/13: 5\' 2"  (1.575 m).   Weight as of 04/25/13: 83.008 kg (183 lb).   Imaging Review Plain radiographs demonstrate moderate degenerative joint disease of the left knee(s). The overall alignment ismild varus. The bone quality appears to be good for age and reported activity level.  Assessment/Plan:  End stage arthritis, left knee   The patient history, physical examination, clinical judgment of the provider and imaging studies are consistent with end stage degenerative joint disease of the left knee(s) and total knee arthroplasty is deemed medically necessary. The treatment options including medical management, injection therapy arthroscopy and arthroplasty were discussed at length. The risks and benefits of total knee arthroplasty were presented and reviewed. The risks due to aseptic loosening, infection, stiffness, patella tracking problems, thromboembolic complications and other imponderables were discussed. The patient acknowledged the explanation, agreed to proceed with the plan and consent was signed. Patient is being admitted for inpatient treatment for surgery, pain control, PT, OT, prophylactic antibiotics, VTE prophylaxis, progressive ambulation and ADL's and discharge planning. The patient is planning to be discharged home with home health services,  hydrocodone for pain, lovenox for dvt proph.

## 2013-09-26 ENCOUNTER — Encounter (HOSPITAL_COMMUNITY)
Admission: RE | Disposition: A | Discharge status: Home Health | Payer: Self-pay | Source: Ambulatory Visit | Attending: Orthopedic Surgery

## 2013-09-26 ENCOUNTER — Inpatient Hospital Stay (HOSPITAL_COMMUNITY)
Admission: RE | Admit: 2013-09-26 | Discharge: 2013-09-28 | DRG: 470 | Disposition: A | Discharge status: Home Health | Payer: BC Managed Care – PPO | Source: Ambulatory Visit | Attending: Orthopedic Surgery | Admitting: Orthopedic Surgery

## 2013-09-26 ENCOUNTER — Encounter (HOSPITAL_COMMUNITY): Payer: Self-pay | Admitting: Surgery

## 2013-09-26 ENCOUNTER — Ambulatory Visit (HOSPITAL_COMMUNITY): Payer: BC Managed Care – PPO | Admitting: Anesthesiology

## 2013-09-26 ENCOUNTER — Encounter (HOSPITAL_COMMUNITY): Payer: BC Managed Care – PPO | Admitting: Vascular Surgery

## 2013-09-26 DIAGNOSIS — F419 Anxiety disorder, unspecified: Secondary | ICD-10-CM | POA: Diagnosis present

## 2013-09-26 DIAGNOSIS — E785 Hyperlipidemia, unspecified: Secondary | ICD-10-CM | POA: Diagnosis present

## 2013-09-26 DIAGNOSIS — Z7982 Long term (current) use of aspirin: Secondary | ICD-10-CM

## 2013-09-26 DIAGNOSIS — E669 Obesity, unspecified: Secondary | ICD-10-CM | POA: Diagnosis present

## 2013-09-26 DIAGNOSIS — Z720 Tobacco use: Secondary | ICD-10-CM | POA: Diagnosis present

## 2013-09-26 DIAGNOSIS — I10 Essential (primary) hypertension: Secondary | ICD-10-CM | POA: Diagnosis present

## 2013-09-26 DIAGNOSIS — F411 Generalized anxiety disorder: Secondary | ICD-10-CM | POA: Diagnosis present

## 2013-09-26 DIAGNOSIS — M1712 Unilateral primary osteoarthritis, left knee: Secondary | ICD-10-CM | POA: Diagnosis present

## 2013-09-26 DIAGNOSIS — Z87891 Personal history of nicotine dependence: Secondary | ICD-10-CM

## 2013-09-26 DIAGNOSIS — M1710 Unilateral primary osteoarthritis, unspecified knee: Principal | ICD-10-CM | POA: Diagnosis present

## 2013-09-26 DIAGNOSIS — I6529 Occlusion and stenosis of unspecified carotid artery: Secondary | ICD-10-CM | POA: Diagnosis present

## 2013-09-26 DIAGNOSIS — Z79899 Other long term (current) drug therapy: Secondary | ICD-10-CM

## 2013-09-26 DIAGNOSIS — Z9861 Coronary angioplasty status: Secondary | ICD-10-CM

## 2013-09-26 DIAGNOSIS — I2510 Atherosclerotic heart disease of native coronary artery without angina pectoris: Secondary | ICD-10-CM | POA: Diagnosis present

## 2013-09-26 HISTORY — PX: TOTAL KNEE ARTHROPLASTY: SHX125

## 2013-09-26 LAB — CREATININE, SERUM
Creatinine, Ser: 0.77 mg/dL (ref 0.50–1.10)
GFR calc Af Amer: >90 mL/min (ref >90)
GFR calc non Af Amer: >90 mL/min (ref >90)

## 2013-09-26 LAB — ABO/RH: ABO/RH(D): O NEG

## 2013-09-26 LAB — CBC
HCT: 33.6 % — ABNORMAL LOW (ref 36.0–46.0)
Hemoglobin: 11.6 g/dL — ABNORMAL LOW (ref 12.0–15.0)
RDW: 13.3 % (ref 11.5–15.5)
WBC: 12.4 10*3/uL — ABNORMAL HIGH (ref 4.0–10.5)

## 2013-09-26 LAB — TYPE AND SCREEN: ABO/RH(D): O NEG

## 2013-09-26 SURGERY — ARTHROPLASTY, KNEE, TOTAL
Anesthesia: General | Site: Knee | Laterality: Left | Wound class: Clean

## 2013-09-26 MED ORDER — ONDANSETRON HCL 4 MG/2ML IJ SOLN
INTRAMUSCULAR | Status: DC | PRN
  Administered 2013-09-26: 4 mg via INTRAVENOUS

## 2013-09-26 MED ORDER — MUPIROCIN 2 % EX OINT
1.0000 "application " | TOPICAL_OINTMENT | Freq: Two times a day (BID) | CUTANEOUS | Status: DC
  Administered 2013-09-26 – 2013-09-27 (×3): 1 via TOPICAL
  Filled 2013-09-26: qty 22

## 2013-09-26 MED ORDER — ACETAMINOPHEN 650 MG RE SUPP: 650.0000 mg | Freq: Four times a day (QID) | RECTAL | Status: DC | PRN

## 2013-09-26 MED ORDER — 0.9 % SODIUM CHLORIDE (POUR BTL) OPTIME
TOPICAL | Status: DC | PRN
  Administered 2013-09-26: 1000 mL

## 2013-09-26 MED ORDER — DIPHENHYDRAMINE HCL 12.5 MG/5ML PO ELIX: 12.5000 mg | ORAL_SOLUTION | ORAL | Status: DC | PRN

## 2013-09-26 MED ORDER — LIDOCAINE HCL (CARDIAC) 20 MG/ML IV SOLN
INTRAVENOUS | Status: DC | PRN
  Administered 2013-09-26: 70 mg via INTRAVENOUS

## 2013-09-26 MED ORDER — OXYCODONE HCL 5 MG PO TABS
5.0000 mg | ORAL_TABLET | ORAL | Status: DC | PRN
  Administered 2013-09-26 (×3): 5 mg via ORAL
  Administered 2013-09-27 – 2013-09-28 (×5): 10 mg via ORAL
  Filled 2013-09-26 (×6): qty 2
  Filled 2013-09-26 (×2): qty 1
  Filled 2013-09-26: qty 2

## 2013-09-26 MED ORDER — ENOXAPARIN SODIUM 30 MG/0.3ML ~~LOC~~ SOLN
30.0000 mg | Freq: Two times a day (BID) | SUBCUTANEOUS | Status: DC
  Administered 2013-09-27 – 2013-09-28 (×3): 30 mg via SUBCUTANEOUS
  Filled 2013-09-26 (×5): qty 0.3

## 2013-09-26 MED ORDER — DILTIAZEM HCL ER COATED BEADS 120 MG PO CP24
120.0000 mg | ORAL_CAPSULE | Freq: Every day | ORAL | Status: DC
  Administered 2013-09-27 – 2013-09-28 (×2): 120 mg via ORAL
  Filled 2013-09-26 (×2): qty 1

## 2013-09-26 MED ORDER — BUPIVACAINE-EPINEPHRINE PF 0.25-1:200000 % IJ SOLN
INTRAMUSCULAR | Status: AC
  Filled 2013-09-26: qty 30

## 2013-09-26 MED ORDER — BUPIVACAINE-EPINEPHRINE PF 0.25-1:200000 % IJ SOLN
INTRAMUSCULAR | Status: DC | PRN
  Administered 2013-09-26: 10 mL

## 2013-09-26 MED ORDER — SODIUM CHLORIDE 0.9 % IV SOLN: INTRAVENOUS | Status: DC

## 2013-09-26 MED ORDER — ONDANSETRON HCL 4 MG/2ML IJ SOLN: 4.0000 mg | Freq: Four times a day (QID) | INTRAMUSCULAR | Status: DC | PRN

## 2013-09-26 MED ORDER — DOCUSATE SODIUM 100 MG PO CAPS
100.0000 mg | ORAL_CAPSULE | Freq: Two times a day (BID) | ORAL | Status: DC
  Administered 2013-09-26 – 2013-09-28 (×5): 100 mg via ORAL
  Filled 2013-09-26 (×6): qty 1

## 2013-09-26 MED ORDER — ISOSORBIDE MONONITRATE ER 30 MG PO TB24
30.0000 mg | ORAL_TABLET | Freq: Every day | ORAL | Status: DC
  Administered 2013-09-27 – 2013-09-28 (×2): 30 mg via ORAL
  Filled 2013-09-26 (×2): qty 1

## 2013-09-26 MED ORDER — CHLORHEXIDINE GLUCONATE 4 % EX LIQD: 60.0000 mL | Freq: Once | CUTANEOUS | Status: DC

## 2013-09-26 MED ORDER — PANTOPRAZOLE SODIUM 40 MG PO TBEC
40.0000 mg | DELAYED_RELEASE_TABLET | Freq: Two times a day (BID) | ORAL | Status: DC
  Administered 2013-09-26 – 2013-09-28 (×5): 40 mg via ORAL
  Filled 2013-09-26 (×5): qty 1

## 2013-09-26 MED ORDER — PROPOFOL 10 MG/ML IV BOLUS
INTRAVENOUS | Status: DC | PRN
  Administered 2013-09-26: 200 mg via INTRAVENOUS

## 2013-09-26 MED ORDER — DEXAMETHASONE SODIUM PHOSPHATE 10 MG/ML IJ SOLN
10.0000 mg | Freq: Three times a day (TID) | INTRAMUSCULAR | Status: AC
  Administered 2013-09-26: 10 mg via INTRAVENOUS
  Filled 2013-09-26 (×3): qty 1

## 2013-09-26 MED ORDER — MENTHOL 3 MG MT LOZG: 1.0000 | LOZENGE | OROMUCOSAL | Status: DC | PRN

## 2013-09-26 MED ORDER — HYDROMORPHONE HCL PF 1 MG/ML IJ SOLN
0.2500 mg | INTRAMUSCULAR | Status: DC | PRN
  Administered 2013-09-26 (×2): 0.5 mg via INTRAVENOUS

## 2013-09-26 MED ORDER — NEOSTIGMINE METHYLSULFATE 1 MG/ML IJ SOLN
INTRAMUSCULAR | Status: DC | PRN
  Administered 2013-09-26: 4 mg via INTRAVENOUS

## 2013-09-26 MED ORDER — ALUM & MAG HYDROXIDE-SIMETH 200-200-20 MG/5ML PO SUSP
30.0000 mL | ORAL | Status: DC | PRN
  Administered 2013-09-27: 30 mL via ORAL
  Filled 2013-09-26: qty 30

## 2013-09-26 MED ORDER — ONDANSETRON HCL 4 MG PO TABS: 4.0000 mg | ORAL_TABLET | Freq: Four times a day (QID) | ORAL | Status: DC | PRN

## 2013-09-26 MED ORDER — HYDROMORPHONE HCL PF 1 MG/ML IJ SOLN
1.0000 mg | INTRAMUSCULAR | Status: DC | PRN
Start: 2013-09-26 — End: 2013-09-28
  Administered 2013-09-28: 1 mg via INTRAVENOUS
  Filled 2013-09-26: qty 1

## 2013-09-26 MED ORDER — OXYCODONE HCL 5 MG PO TABS
ORAL_TABLET | ORAL | Status: AC
  Administered 2013-09-26: 5 mg via ORAL
  Filled 2013-09-26: qty 1

## 2013-09-26 MED ORDER — ZOLPIDEM TARTRATE 5 MG PO TABS
5.0000 mg | ORAL_TABLET | Freq: Every day | ORAL | Status: DC
  Administered 2013-09-26 – 2013-09-27 (×2): 5 mg via ORAL
  Filled 2013-09-26 (×2): qty 1

## 2013-09-26 MED ORDER — PILOCARPINE HCL 5 MG PO TABS
5.0000 mg | ORAL_TABLET | Freq: Three times a day (TID) | ORAL | Status: DC
  Administered 2013-09-26 – 2013-09-28 (×6): 5 mg via ORAL
  Filled 2013-09-26 (×9): qty 1

## 2013-09-26 MED ORDER — ARTIFICIAL TEARS OP OINT
TOPICAL_OINTMENT | OPHTHALMIC | Status: DC | PRN
  Administered 2013-09-26: 1 via OPHTHALMIC

## 2013-09-26 MED ORDER — LISINOPRIL-HYDROCHLOROTHIAZIDE 10-12.5 MG PO TABS: 1.0000 | ORAL_TABLET | Freq: Every day | ORAL | Status: DC

## 2013-09-26 MED ORDER — KETOROLAC TROMETHAMINE 15 MG/ML IJ SOLN
7.5000 mg | Freq: Four times a day (QID) | INTRAMUSCULAR | Status: AC
  Administered 2013-09-26 – 2013-09-27 (×4): 7.5 mg via INTRAVENOUS
  Filled 2013-09-26 (×4): qty 1

## 2013-09-26 MED ORDER — FENTANYL CITRATE 0.05 MG/ML IJ SOLN
INTRAMUSCULAR | Status: DC | PRN
  Administered 2013-09-26 (×2): 100 ug via INTRAVENOUS
  Administered 2013-09-26: 50 ug via INTRAVENOUS

## 2013-09-26 MED ORDER — SODIUM CHLORIDE 0.9 % IR SOLN
Status: DC | PRN
  Administered 2013-09-26: 3000 mL

## 2013-09-26 MED ORDER — DEXAMETHASONE 6 MG PO TABS
10.0000 mg | ORAL_TABLET | Freq: Three times a day (TID) | ORAL | Status: AC
  Administered 2013-09-26 – 2013-09-27 (×2): 10 mg via ORAL
  Filled 2013-09-26 (×3): qty 1

## 2013-09-26 MED ORDER — GLYCOPYRROLATE 0.2 MG/ML IJ SOLN
INTRAMUSCULAR | Status: DC | PRN
  Administered 2013-09-26: .8 mg via INTRAVENOUS

## 2013-09-26 MED ORDER — BISACODYL 5 MG PO TBEC
10.0000 mg | DELAYED_RELEASE_TABLET | Freq: Every day | ORAL | Status: DC
  Administered 2013-09-26 – 2013-09-27 (×2): 10 mg via ORAL
  Filled 2013-09-26 (×2): qty 2

## 2013-09-26 MED ORDER — HYDROMORPHONE HCL PF 1 MG/ML IJ SOLN
INTRAMUSCULAR | Status: AC
  Filled 2013-09-26: qty 1

## 2013-09-26 MED ORDER — ACETAMINOPHEN 325 MG PO TABS: 650.0000 mg | ORAL_TABLET | Freq: Four times a day (QID) | ORAL | Status: DC | PRN

## 2013-09-26 MED ORDER — POTASSIUM CHLORIDE IN NACL 20-0.9 MEQ/L-% IV SOLN
INTRAVENOUS | Status: DC
  Administered 2013-09-26: 23:00:00 via INTRAVENOUS
  Administered 2013-09-26: 1 mL via INTRAVENOUS
  Administered 2013-09-27: 09:00:00 via INTRAVENOUS
  Filled 2013-09-26 (×7): qty 1000

## 2013-09-26 MED ORDER — VENLAFAXINE HCL ER 150 MG PO CP24
150.0000 mg | ORAL_CAPSULE | Freq: Every day | ORAL | Status: DC
  Administered 2013-09-27 – 2013-09-28 (×2): 150 mg via ORAL
  Filled 2013-09-26 (×2): qty 1

## 2013-09-26 MED ORDER — DEXAMETHASONE SODIUM PHOSPHATE 10 MG/ML IJ SOLN
INTRAMUSCULAR | Status: DC | PRN
  Administered 2013-09-26: 10 mg via INTRAVENOUS

## 2013-09-26 MED ORDER — LACTATED RINGERS IV SOLN
INTRAVENOUS | Status: DC | PRN
  Administered 2013-09-26 (×2): via INTRAVENOUS

## 2013-09-26 MED ORDER — OXYCODONE HCL ER 20 MG PO T12A
20.0000 mg | EXTENDED_RELEASE_TABLET | Freq: Two times a day (BID) | ORAL | Status: DC
  Administered 2013-09-26 – 2013-09-28 (×4): 20 mg via ORAL
  Filled 2013-09-26 (×4): qty 1

## 2013-09-26 MED ORDER — ROCURONIUM BROMIDE 100 MG/10ML IV SOLN
INTRAVENOUS | Status: DC | PRN
  Administered 2013-09-26: 50 mg via INTRAVENOUS

## 2013-09-26 MED ORDER — METOCLOPRAMIDE HCL 5 MG/ML IJ SOLN: 5.0000 mg | Freq: Three times a day (TID) | INTRAMUSCULAR | Status: DC | PRN

## 2013-09-26 MED ORDER — LISINOPRIL 10 MG PO TABS
10.0000 mg | ORAL_TABLET | Freq: Every day | ORAL | Status: DC
  Administered 2013-09-28: 10 mg via ORAL
  Filled 2013-09-26: qty 1

## 2013-09-26 MED ORDER — PHENOL 1.4 % MT LIQD: 1.0000 | OROMUCOSAL | Status: DC | PRN

## 2013-09-26 MED ORDER — MIDAZOLAM HCL 5 MG/5ML IJ SOLN
INTRAMUSCULAR | Status: DC | PRN
  Administered 2013-09-26: 2 mg via INTRAVENOUS

## 2013-09-26 MED ORDER — HYDROCHLOROTHIAZIDE 12.5 MG PO CAPS
12.5000 mg | ORAL_CAPSULE | Freq: Every day | ORAL | Status: DC
  Administered 2013-09-28: 12.5 mg via ORAL
  Filled 2013-09-26: qty 1

## 2013-09-26 MED ORDER — METOCLOPRAMIDE HCL 10 MG PO TABS: 5.0000 mg | ORAL_TABLET | Freq: Three times a day (TID) | ORAL | Status: DC | PRN

## 2013-09-26 SURGICAL SUPPLY — 67 items
BANDAGE ELASTIC 4 VELCRO ST LF (GAUZE/BANDAGES/DRESSINGS) ×2 IMPLANT
BANDAGE ELASTIC 6 VELCRO ST LF (GAUZE/BANDAGES/DRESSINGS) ×2 IMPLANT
BANDAGE ESMARK 6X9 LF (GAUZE/BANDAGES/DRESSINGS) ×1 IMPLANT
BLADE SAGITTAL 25.0X1.19X90 (BLADE) ×2 IMPLANT
BLADE SAW SAG 90X13X1.27 (BLADE) ×2 IMPLANT
BNDG CMPR 9X6 STRL LF SNTH (GAUZE/BANDAGES/DRESSINGS) ×1
BNDG ESMARK 6X9 LF (GAUZE/BANDAGES/DRESSINGS) ×2
BONE CEMENT GENTAMICIN (Cement) ×4 IMPLANT
BOWL SMART MIX CTS (DISPOSABLE) ×2 IMPLANT
CAPT RP KNEE ×1 IMPLANT
CEMENT BONE GENTAMICIN 40 (Cement) IMPLANT
CLOTH BEACON ORANGE TIMEOUT ST (SAFETY) ×2 IMPLANT
COVER SURGICAL LIGHT HANDLE (MISCELLANEOUS) ×2 IMPLANT
CUFF TOURNIQUET SINGLE 34IN LL (TOURNIQUET CUFF) ×2 IMPLANT
CUFF TOURNIQUET SINGLE 44IN (TOURNIQUET CUFF) IMPLANT
DRAPE INCISE IOBAN 66X45 STRL (DRAPES) ×1 IMPLANT
DRAPE ORTHO SPLIT 77X108 STRL (DRAPES) ×4
DRAPE SURG ORHT 6 SPLT 77X108 (DRAPES) ×2 IMPLANT
DRAPE U-SHAPE 47X51 STRL (DRAPES) ×2 IMPLANT
DRSG ADAPTIC 3X8 NADH LF (GAUZE/BANDAGES/DRESSINGS) ×2 IMPLANT
DRSG PAD ABDOMINAL 8X10 ST (GAUZE/BANDAGES/DRESSINGS) ×4 IMPLANT
DURAPREP 26ML APPLICATOR (WOUND CARE) ×3 IMPLANT
ELECT REM PT RETURN 9FT ADLT (ELECTROSURGICAL) ×2
ELECTRODE REM PT RTRN 9FT ADLT (ELECTROSURGICAL) ×1 IMPLANT
EVACUATOR 1/8 PVC DRAIN (DRAIN) ×2 IMPLANT
FACESHIELD LNG OPTICON STERILE (SAFETY) ×3 IMPLANT
FLOSEAL 10ML (HEMOSTASIS) IMPLANT
GLOVE BIO SURGEON STRL SZ7 (GLOVE) ×1 IMPLANT
GLOVE BIOGEL PI IND STRL 6.5 (GLOVE) ×1 IMPLANT
GLOVE BIOGEL PI IND STRL 7.0 (GLOVE) IMPLANT
GLOVE BIOGEL PI IND STRL 8 (GLOVE) ×4 IMPLANT
GLOVE BIOGEL PI INDICATOR 6.5 (GLOVE) ×1
GLOVE BIOGEL PI INDICATOR 7.0 (GLOVE) ×1
GLOVE BIOGEL PI INDICATOR 8 (GLOVE) ×1
GLOVE ORTHO TXT STRL SZ7.5 (GLOVE) ×3 IMPLANT
GLOVE SURG ORTHO 8.0 STRL STRW (GLOVE) ×5 IMPLANT
GLOVE SURG SS PI 6.5 STRL IVOR (GLOVE) ×1 IMPLANT
GOWN PREVENTION PLUS XLARGE (GOWN DISPOSABLE) ×3 IMPLANT
GOWN PREVENTION PLUS XXLARGE (GOWN DISPOSABLE) ×1 IMPLANT
GOWN STRL NON-REIN LRG LVL3 (GOWN DISPOSABLE) ×3 IMPLANT
HANDPIECE INTERPULSE COAX TIP (DISPOSABLE) ×2
HOOD PEEL AWAY FACE SHEILD DIS (HOOD) ×2 IMPLANT
IMMOBILIZER KNEE 22 UNIV (SOFTGOODS) ×1 IMPLANT
KIT BASIN OR (CUSTOM PROCEDURE TRAY) ×2 IMPLANT
KIT ROOM TURNOVER OR (KITS) ×2 IMPLANT
MANIFOLD NEPTUNE II (INSTRUMENTS) ×2 IMPLANT
NEEDLE 22X1 1/2 (OR ONLY) (NEEDLE) IMPLANT
NS IRRIG 1000ML POUR BTL (IV SOLUTION) ×2 IMPLANT
PACK TOTAL JOINT (CUSTOM PROCEDURE TRAY) ×2 IMPLANT
PAD ARMBOARD 7.5X6 YLW CONV (MISCELLANEOUS) ×2 IMPLANT
PAD CAST 4YDX4 CTTN HI CHSV (CAST SUPPLIES) ×1 IMPLANT
PADDING CAST COTTON 4X4 STRL (CAST SUPPLIES) ×2
PADDING CAST COTTON 6X4 STRL (CAST SUPPLIES) ×2 IMPLANT
SET HNDPC FAN SPRY TIP SCT (DISPOSABLE) ×1 IMPLANT
SPONGE GAUZE 4X4 12PLY (GAUZE/BANDAGES/DRESSINGS) ×2 IMPLANT
STAPLER VISISTAT 35W (STAPLE) ×2 IMPLANT
SUCTION FRAZIER TIP 10 FR DISP (SUCTIONS) ×2 IMPLANT
SUT ETHIBOND NAB CT1 #1 30IN (SUTURE) ×5 IMPLANT
SUT VIC AB 0 CT1 27 (SUTURE) ×2
SUT VIC AB 0 CT1 27XBRD ANBCTR (SUTURE) ×1 IMPLANT
SUT VIC AB 2-0 CT1 27 (SUTURE) ×4
SUT VIC AB 2-0 CT1 TAPERPNT 27 (SUTURE) ×2 IMPLANT
SYR CONTROL 10ML LL (SYRINGE) IMPLANT
TOWEL OR 17X24 6PK STRL BLUE (TOWEL DISPOSABLE) ×2 IMPLANT
TOWEL OR 17X26 10 PK STRL BLUE (TOWEL DISPOSABLE) ×2 IMPLANT
TRAY FOLEY CATH 16FRSI W/METER (SET/KITS/TRAYS/PACK) ×2 IMPLANT
WATER STERILE IRR 1000ML POUR (IV SOLUTION) ×4 IMPLANT

## 2013-09-26 NOTE — Transfer of Care (Signed)
Immediate Anesthesia Transfer of Care Note  Patient: [name]  Procedure(s) Performed: Procedure(s): TOTAL KNEE ARTHROPLASTY- LEFT (Left)  Patient Location: PACU  Anesthesia Type:General  Level of Consciousness: awake, alert , oriented and sedated  Airway & Oxygen Therapy: Patient Spontanous Breathing and Patient connected to nasal cannula oxygen  Post-op Assessment: Report given to PACU RN, Post -op Vital signs reviewed and stable and Patient moving all extremities  Post vital signs: Reviewed and stable  Complications: No apparent anesthesia complications

## 2013-09-26 NOTE — Preoperative (Signed)
Beta Blockers   Reason not to administer Beta Blockers:Not Applicable 

## 2013-09-26 NOTE — Progress Notes (Signed)
Orthopedic Tech Progress Note Patient Details:  [name] March 16, 1956 [id number]  Patient ID: [name], female   DOB: [birth year], 57 y.o.   MRN: [id number] PLACED PT'S LLE IN CPM @0 -60 DEGREES@ 1505  Kasson Lamere 09/26/2013, 3:01 PM

## 2013-09-26 NOTE — Evaluation (Signed)
Physical Therapy Evaluation Patient Details Name: [name] MRN: [id number] DOB: 19-Feb-1956 Today's Date: 09/26/2013 Time: 1335-1410 PT Time Calculation (min): 35 min  PT Assessment / Plan / Recommendation History of Present Illness  57 year old female admitted with OA Lt. knee, s/p Lt. TKA    Clinical Impression  Pt moving very well for day of surgery. Transfering at min assist level with RW. Will benefit from continued acute rehab to address deficits listed below.     PT Assessment  Patient needs continued PT services    Follow Up Recommendations  Home health PT;Supervision/Assistance - 24 hour          Equipment Recommendations   (pt reports she is well equipt, would like to practice tub transfer)       Frequency 7X/week    Precautions / Restrictions Precautions Precautions: Fall Required Braces or Orthoses: Knee Immobilizer - Left Restrictions Weight Bearing Restrictions: No   Pertinent Vitals/Pain Faces 4/10 Lt. Knee, RN made aware of need for medication      Mobility  Bed Mobility Bed Mobility: Rolling Right;Right Sidelying to Sit Rolling Right: 4: Min assist Right Sidelying to Sit: 5: Supervision Transfers Transfers: Sit to Stand;Stand to Sit Sit to Stand: 4: Min assist Stand to Sit: 4: Min assist Details for Transfer Assistance: Cues for UE/LE placement for safety and pain modulation.  Ambulation/Gait Ambulation/Gait Assistance:  (Not attempted due to Lt. LE weakness with day of surgery)    Exercises Total Joint Exercises Ankle Circles/Pumps: AROM;Both;20 reps;Supine Quad Sets: AROM;AAROM;Left;10 reps;Supine Towel Squeeze: AROM;Both;10 reps;Supine Short Arc Quad: AROM;Left;10 reps;Supine Heel Slides: AAROM;10 reps;Supine;Left (used sheet) Straight Leg Raises: AAROM;Left;10 reps;Supine   PT Diagnosis: Abnormality of gait;Difficulty walking;Generalized weakness;Acute pain  PT Problem List: Decreased strength;Decreased range of motion;Decreased  activity tolerance;Decreased balance;Decreased mobility;Decreased knowledge of use of DME;Pain PT Treatment Interventions: DME instruction;Gait training;Stair training;Functional mobility training;Therapeutic activities;Therapeutic exercise;Balance training;Neuromuscular re-education;Patient/family education     PT Goals(Current goals can be found in the care plan section) Acute Rehab PT Goals Patient Stated Goal: Go home PT Goal Formulation: With patient Time For Goal Achievement: 10/03/13 Potential to Achieve Goals: Good  Visit Information  Last PT Received On: 09/26/13 Assistance Needed: +1 History of Present Illness: 57 year old female admitted with OA Lt. knee, s/p Lt. TKA         Prior Functioning  Home Living Family/patient expects to be discharged to:: Private residence Living Arrangements: Spouse/significant other Available Help at Discharge: Family Type of Home: House Home Access: Level entry Home Layout: One level Home Equipment: Environmental consultant - 2 wheels;Tub bench (CPM) Prior Function Level of Independence: Independent Communication Communication: No difficulties    Cognition  Cognition Arousal/Alertness: Awake/alert Behavior During Therapy: WFL for tasks assessed/performed    Extremity/Trunk Assessment Upper Extremity Assessment Upper Extremity Assessment: Defer to OT evaluation Lower Extremity Assessment Lower Extremity Assessment: RLE deficits/detail;LLE deficits/detail RLE Deficits / Details: WFL LLE Deficits / Details: WFL dorsiflexion/plantarflexion strength and ROM, knee exetnsion limited by 20 degrees/~40 degrees available knee flexion. Strength not tested secondary to pain. Cervical / Trunk Assessment Cervical / Trunk Assessment: Normal   Balance Balance Balance Assessed: Yes Static Sitting Balance Static Sitting - Balance Support: No upper extremity supported;Feet supported Static Sitting - Level of Assistance: 5: Stand by assistance Static Standing  Balance Static Standing - Balance Support: Bilateral upper extremity supported Static Standing - Level of Assistance: 4: Min assist  End of Session PT - End of Session Equipment Utilized During Treatment: Gait belt  Activity Tolerance: Patient tolerated treatment well Patient left: in chair;with call bell/phone within reach (Lt. heel elevated to promote knee extension) Nurse Communication: Mobility status;Patient requests pain meds (need to use immobilizer with transfer, transfer to Rt.) CPM Left Knee CPM Left Knee: On Left Knee Flexion (Degrees): 60 Left Knee Extension (Degrees): 0  GP     [name] 09/26/2013, 2:26 PM

## 2013-09-26 NOTE — Anesthesia Postprocedure Evaluation (Signed)
  Anesthesia Post-op Note  Patient: [name]  Procedure(s) Performed: Procedure(s): TOTAL KNEE ARTHROPLASTY- LEFT (Left)  Patient Location: PACU  Anesthesia Type:General  Level of Consciousness: awake  Airway and Oxygen Therapy: Patient Spontanous Breathing  Post-op Pain: mild  Post-op Assessment: Post-op Vital signs reviewed  Post-op Vital Signs: Reviewed  Complications: No apparent anesthesia complications

## 2013-09-26 NOTE — H&P (View-Only) (Signed)
TOTAL KNEE ADMISSION H&P  Patient is being admitted for left total knee arthroplasty.  Subjective:  Chief Complaint:left knee pain.  HPI: [name], 57 y.o. female, has a history of pain and functional disability in the left knee due to arthritis and has failed non-surgical conservative treatments for greater than 12 weeks to includeNSAID's and/or analgesics, corticosteriod injections, viscosupplementation injections, flexibility and strengthening excercises, use of assistive devices and activity modification.  Onset of symptoms was gradual, starting 3 years ago with gradually worsening course since that time. The patient noted prior procedures on the knee to include  arthroscopy and menisectomy on the left knee(s).  Patient currently rates pain in the left knee(s) at 10 out of 10 with activity. Patient has night pain, worsening of pain with activity and weight bearing, pain that interferes with activities of daily living, pain with passive range of motion, crepitus and joint swelling.  Patient has evidence of joint space narrowing by imaging studies. . There is no active infection.  Patient Active Problem List   Diagnosis Date Noted  . Arthritis of knee 10/31/2011  . Tobacco abuse 03/28/2011  . Obesity 03/28/2011  . CAD (coronary artery disease)   . HTN (hypertension)   . Hyperlipidemia   . Anxiety    Past Medical History  Diagnosis Date  . Hyperlipidemia   . Anxiety   . Carotid stenosis     Less than 50% 8/11  . CAD (coronary artery disease)     PCI Stent RCA 03/12/11.  Cath 03/22/11 LVEF 55-60%.  RCA patent  proximal/distal stents with 30% proximal disease and 20% mid stenosis.  LCX with 30%  mid stenosis.  Questionable spasm.  . HTN (hypertension)   . H/O hiatal hernia     Past Surgical History  Procedure Laterality Date  . Ectopic pregnancy surgery    . Tonsillectomy and adenoidectomy    . Cholecystectomy    . Left wrist    . Heart stent    . Cyst removed from spine      . Left knee      Arthroscopic  . Tubal ligation       (Not in a hospital admission) Allergies  Allergen Reactions  . Sulfa Antibiotics Itching, Rash and Other (See Comments)    "severe muscle cramps, tongue cracked, dry mouth "    History  Substance Use Topics  . Smoking status: Former Smoker -- 1.00 packs/day for 10 years    Types: Cigarettes    Quit date: 11/27/2010  . Smokeless tobacco: Never Used  . Alcohol Use: Yes     Comment: social    Family History  Problem Relation Age of Onset  . Heart disease    . Arthritis    . Cancer       Review of Systems  Constitutional: Negative.   HENT: Negative.   Eyes: Negative.   Respiratory: Positive for shortness of breath. Negative for cough, hemoptysis, sputum production and wheezing.   Cardiovascular: Positive for chest pain. Negative for palpitations, orthopnea, claudication, leg swelling and PND.  Gastrointestinal: Negative.   Genitourinary: Positive for dysuria and hematuria. Negative for urgency, frequency and flank pain.  Musculoskeletal: Positive for joint pain and myalgias. Negative for falls and neck pain.  Skin: Negative.   Neurological: Negative.   Endo/Heme/Allergies: Negative.   Psychiatric/Behavioral: Negative.     Objective:  Physical Exam  Constitutional: She is oriented to person, place, and time. She appears well-developed and well-nourished. No distress.  HENT:    Head: Normocephalic and atraumatic.  Nose: Nose normal.  Eyes: Conjunctivae and EOM are normal. Pupils are equal, round, and reactive to light.  Neck: Normal range of motion. Neck supple.  Cardiovascular: Normal rate, regular rhythm, normal heart sounds and intact distal pulses.   No murmur heard. Respiratory: Effort normal and breath sounds normal. No respiratory distress. She has no wheezes. She exhibits no tenderness.  GI: Soft. Bowel sounds are normal. She exhibits no distension. There is no tenderness.  Musculoskeletal:       Left  knee: She exhibits decreased range of motion, swelling and effusion. She exhibits no laceration, no erythema, no LCL laxity and no MCL laxity. Tenderness found. Medial joint line and lateral joint line tenderness noted.  Lymphadenopathy:    She has no cervical adenopathy.  Neurological: She is alert and oriented to person, place, and time. No cranial nerve deficit.  Skin: Skin is warm and dry. No erythema.     Psychiatric: She has a normal mood and affect. Her behavior is normal.    Vital signs in last 24 hours: @VSRANGES@  Labs:   Estimated body mass index is 33.46 kg/(m^2) as calculated from the following:   Height as of 04/25/13: 5' 2" (1.575 m).   Weight as of 04/25/13: 83.008 kg (183 lb).   Imaging Review Plain radiographs demonstrate moderate degenerative joint disease of the left knee(s). The overall alignment ismild varus. The bone quality appears to be good for age and reported activity level.  Assessment/Plan:  End stage arthritis, left knee   The patient history, physical examination, clinical judgment of the provider and imaging studies are consistent with end stage degenerative joint disease of the left knee(s) and total knee arthroplasty is deemed medically necessary. The treatment options including medical management, injection therapy arthroscopy and arthroplasty were discussed at length. The risks and benefits of total knee arthroplasty were presented and reviewed. The risks due to aseptic loosening, infection, stiffness, patella tracking problems, thromboembolic complications and other imponderables were discussed. The patient acknowledged the explanation, agreed to proceed with the plan and consent was signed. Patient is being admitted for inpatient treatment for surgery, pain control, PT, OT, prophylactic antibiotics, VTE prophylaxis, progressive ambulation and ADL's and discharge planning. The patient is planning to be discharged home with home health services,  hydrocodone for pain, lovenox for dvt proph.   

## 2013-09-26 NOTE — Op Note (Signed)
[name], [name]              ACCOUNT NO.:  [id number]  MEDICAL RECORD NO.:  [id number]  LOCATION:  5N30C                        FACILITY:  [facility]  PHYSICIAN:  Dyke Brackett, M.D.    DATE OF BIRTH:  [birth year]  DATE OF PROCEDURE:  09/26/2013 DATE OF DISCHARGE:                              OPERATIVE REPORT   INDICATIONS:  A 57 year old, intractable pain from osteoarthritis of the left knee, status post arthroscopy, multiple injections, and conservative treatment failed, thought to be amenable to hospitalization replacement.  PREOPERATIVE DIAGNOSIS:  Left knee osteoarthritis.  POSTOPERATIVE DIAGNOSIS:  Left knee osteoarthritis.  OPERATION:  Left total knee replacement (DePuy Sigma cemented knee, size 2 femur, 2.5 tibia, 12.5-mm bearing, 32-mm all poly patella).  SURGEON:  Dyke Brackett, M.D.  ASSISTANT:  Kirstin Shepperson, PA-C.  TOURNIQUET TIME:  67 minutes.  DESCRIPTION OF PROCEDURE:  Supine positioning, femoral nerve block, and general anesthetic.  Straight skin incision with medial parapatellar approach to the knee made.  We cut 11 mm with a 5-degree valgus cut off the distal femur followed by cutting about 4 mm below the most diseased medial compartment after everting the patella laterally.  Extension gap was initially 10 and then measured at 12.5 mm.  We then sized the femur to a size 2, placed appropriate guidepins, appropriate spacer block, external rotation at about 3 degrees, then cut the anterior-posterior cuts, chamfer cuts.  Flexion gap equal to extension gap at 12.5 mm.  The menisci were removed as well as release of the posterior cruciate ligament.  Kevin Fenton was cut for the tibia and the box for the femur, then trials were placed on those, again 0 degrees with full extension noted. Good stability of the bearing with a 12.5-mm bearing in flexion and good balancing was noted.  Patella was cut leaving about 13-14 mm of native patella for 32-mm all poly trial  and again, all trials were placed and parameters were deemed to be acceptable.  Final components were inserted after the trials were removed.  The bony surfaces were irrigated using antibiotic impregnated cement.  The patient had a recent skin infection that had basically resolved.  Cement was allowed to harden with the trial bearing in place, we then removed the trial bearing, checked the posterior aspect of this need for any excess cement, none was noted.  Then, we released the tourniquet.  No excessive bleeding was noted in the posterior aspect of the knee.  Small bleeders were coagulated.  Closure was affected with interrupted #1 Ethibond, 2-0 Vicryl, and skin clips. Marcaine with epinephrine infiltrated into the skin.  Lightly compressive sterile dressing, knee immobilizer applied, taken to the recovery room in good and stable condition.     Dyke Brackett, M.D.     WDC/MEDQ  D:  09/26/2013  T:  09/26/2013  Job:  [id number]

## 2013-09-26 NOTE — Progress Notes (Signed)
Orthopedic Tech Progress Note Patient Details:  [name] [birth year] [id number] CPM applied with appropriate settings. OHF applied to bed.  CPM Left Knee CPM Left Knee: On Left Knee Flexion (Degrees): 60 Left Knee Extension (Degrees): 0   Asia R Thompson 09/26/2013, 11:58 AM

## 2013-09-26 NOTE — Progress Notes (Signed)
Orthopedic Tech Progress Note Patient Details:  [name] 13-Aug-1956 [id number]  Patient ID: [name], female   DOB: Mar 16, 1956, 57 y.o.   MRN: [id number] Placed pt's lle in cpm@0 -60 degrees@2000   Nikki Dom 09/26/2013, 7:56 PM

## 2013-09-26 NOTE — Anesthesia Preprocedure Evaluation (Signed)
Anesthesia Evaluation  Patient identified by MRN, date of birth, ID band Patient awake    Reviewed: Allergy & Precautions, H&P , NPO status   Airway Mallampati: II      Dental   Pulmonary neg pulmonary ROS,  breath sounds clear to auscultation        Cardiovascular hypertension, + CAD and + Peripheral Vascular Disease Rhythm:Regular Rate:Normal     Neuro/Psych    GI/Hepatic Neg liver ROS, hiatal hernia,   Endo/Other    Renal/GU negative Renal ROS     Musculoskeletal   Abdominal   Peds  Hematology   Anesthesia Other Findings   Reproductive/Obstetrics                           Anesthesia Physical Anesthesia Plan  ASA: II  Anesthesia Plan: General   Post-op Pain Management:    Induction: Intravenous  Airway Management Planned: Oral ETT  Additional Equipment:   Intra-op Plan:   Post-operative Plan: Extubation in OR  Informed Consent: I have reviewed the patients History and Physical, chart, labs and discussed the procedure including the risks, benefits and alternatives for the proposed anesthesia with the patient or authorized representative who has indicated his/her understanding and acceptance.   Dental advisory given  Plan Discussed with: CRNA, Anesthesiologist and Surgeon  Anesthesia Plan Comments:         Anesthesia Quick Evaluation

## 2013-09-26 NOTE — Interval H&P Note (Signed)
History and Physical Interval Note:  09/26/2013 7:33 AM  [name]  has presented today for surgery, with the diagnosis of OA LEFT KNEE  The various methods of treatment have been discussed with the patient and family. After consideration of risks, benefits and other options for treatment, the patient has consented to  Procedure(s): TOTAL KNEE ARTHROPLASTY (Left) as a surgical intervention .  The patient's history has been reviewed, patient examined, no change in status, stable for surgery.  I have reviewed the patient's chart and labs.  Questions were answered to the patient's satisfaction.     Martha Ellerby JR,W D

## 2013-09-27 LAB — CBC
Hemoglobin: 10.1 g/dL — ABNORMAL LOW (ref 12.0–15.0)
MCV: 90.6 fL (ref 78.0–100.0)
Platelets: 252 10*3/uL (ref 150–400)
RBC: 3.31 MIL/uL — ABNORMAL LOW (ref 3.87–5.11)
RDW: 13.5 % (ref 11.5–15.5)
WBC: 12.3 10*3/uL — ABNORMAL HIGH (ref 4.0–10.5)

## 2013-09-27 LAB — BASIC METABOLIC PANEL
BUN: 12 mg/dL (ref 6–23)
CO2: 26 mEq/L (ref 19–32)
Chloride: 105 mEq/L (ref 96–112)
Creatinine, Ser: 0.74 mg/dL (ref 0.50–1.10)
Glucose, Bld: 154 mg/dL — ABNORMAL HIGH (ref 70–99)
Potassium: 4.8 mEq/L (ref 3.5–5.1)
Sodium: 138 mEq/L (ref 135–145)

## 2013-09-27 MED ORDER — POLYETHYLENE GLYCOL 3350 17 G PO PACK
17.0000 g | PACK | Freq: Every day | ORAL | Status: DC | PRN
  Administered 2013-09-27 – 2013-09-28 (×2): 17 g via ORAL
  Filled 2013-09-27: qty 1

## 2013-09-27 MED ORDER — POLYETHYLENE GLYCOL 3350 17 G PO PACK: 17.0000 g | PACK | Freq: Every day | ORAL | Status: DC

## 2013-09-27 NOTE — Progress Notes (Signed)
Physical Therapy Treatment Patient Details Name: [name] MRN: [id number] DOB: October 23, 1956 Today's Date: 09/27/2013 Time: 1030-1055 PT Time Calculation (min): 25 min  PT Assessment / Plan / Recommendation  History of Present Illness 57 year old female admitted with OA Lt. knee, s/p Lt. TKA     PT Comments   Pt progressing very well with therapy + mobility.  Ambulated ~150' with RW.  On track to safely d/c home when MD feels medically ready.  Pt set-up on CPM at end of session 0-65 degrees.     Follow Up Recommendations  Home health PT;Supervision/Assistance - 24 hour     Does the patient have the potential to tolerate intense rehabilitation     Barriers to Discharge        Equipment Recommendations       Recommendations for Other Services    Frequency 7X/week   Progress towards PT Goals Progress towards PT goals: Progressing toward goals  Plan Current plan remains appropriate    Precautions / Restrictions Precautions Precautions: Fall Required Braces or Orthoses: Knee Immobilizer - Left Restrictions Weight Bearing Restrictions: No   Pertinent Vitals/Pain 5/10 Lt knee.  RN notified for pain meds.     Mobility  Bed Mobility Bed Mobility: Sit to Supine Sit to Supine: 5: Supervision;HOB flat Transfers Transfers: Sit to Stand;Stand to Sit Sit to Stand: 5: Supervision;With upper extremity assist;With armrests;From chair/3-in-1 Stand to Sit: 5: Supervision;With upper extremity assist;With armrests;To chair/3-in-1;To bed Details for Transfer Assistance: Pt demonstrated safe technique Ambulation/Gait Ambulation/Gait Assistance: 4: Min guard Ambulation Distance (Feet): 150 Feet Assistive device: Rolling walker Ambulation/Gait Assistance Details: Cues for Lt quad activation during stance phase.   Gait Pattern: Step-to pattern;Decreased weight shift to left (decreased Rt foot floor clearance) Stairs: No Wheelchair Mobility Wheelchair Mobility: No    Exercises Total  Joint Exercises Ankle Circles/Pumps: AROM;Both;10 reps Quad Sets: AROM;Both;10 reps Heel Slides: AROM;Left;10 reps;Supine Straight Leg Raises: AROM;Strengthening;Left;10 reps     PT Goals (current goals can now be found in the care plan section) Acute Rehab PT Goals PT Goal Formulation: With patient Time For Goal Achievement: 10/03/13 Potential to Achieve Goals: Good  Visit Information  Last PT Received On: 09/27/13 Assistance Needed: +1 History of Present Illness: 57 year old female admitted with OA Lt. knee, s/p Lt. TKA      Subjective Data      Cognition  Cognition Arousal/Alertness: Awake/alert Behavior During Therapy: WFL for tasks assessed/performed Overall Cognitive Status: Within Functional Limits for tasks assessed    Balance     End of Session PT - End of Session Equipment Utilized During Treatment: Gait belt;Left knee immobilizer Activity Tolerance: Patient tolerated treatment well Patient left: in bed;in CPM;with family/visitor present Nurse Communication: Mobility status;Patient requests pain meds   GP     Lara Mulch 09/27/2013, 11:00 AM   Verdell Face, PTA [phone] 09/27/2013

## 2013-09-27 NOTE — Progress Notes (Signed)
Orthopedic Tech Progress Note Patient Details:  [name] 1955-12-22 [id number] On cpm at 8:05 pm LLE 0-67 Patient ID: [name], female   DOB: Mar 29, 1956, 57 y.o.   MRN: [id number]   [name] 09/27/2013, 8:05 PM

## 2013-09-27 NOTE — Evaluation (Signed)
Occupational Therapy Evaluation Patient Details Name: [name] MRN: [id number] DOB: Sep 02, 1956 Today's Date: 09/27/2013 Time: 1324-4010 OT Time Calculation (min): 26 min  OT Assessment / Plan / Recommendation History of present illness 57 year old female admitted with OA Lt. knee, s/p Lt. TKA     Clinical Impression   Pt s/p L TKA. Pt is overall supervision level with ADLs and functional mobility. Education completed. No further acute OT services needed.    OT Assessment  Patient does not need any further OT services    Follow Up Recommendations  No OT follow up;Supervision - Intermittent    Barriers to Discharge      Equipment Recommendations  3 in 1 bedside comode    Recommendations for Other Services    Frequency       Precautions / Restrictions Precautions Precautions: Fall Required Braces or Orthoses: Knee Immobilizer - Left Restrictions Weight Bearing Restrictions: No   Pertinent Vitals/Pain See vitals   ADL  Eating/Feeding: Performed;Independent Where Assessed - Eating/Feeding: Edge of bed Grooming: Performed;Wash/dry hands;Independent Where Assessed - Grooming: Unsupported standing Upper Body Bathing: Simulated;Supervision/safety Where Assessed - Upper Body Bathing: Unsupported sitting Lower Body Bathing: Simulated;Supervision/safety Where Assessed - Lower Body Bathing: Unsupported sit to stand Upper Body Dressing: Performed;Supervision/safety Where Assessed - Upper Body Dressing: Unsupported sitting Lower Body Dressing: Performed;Minimal assistance Where Assessed - Lower Body Dressing: Unsupported sit to stand Toilet Transfer: Performed;Supervision/safety Toilet Transfer Method: Sit to stand Toilet Transfer Equipment: Raised toilet seat with arms (or 3-in-1 over toilet) Toileting - Clothing Manipulation and Hygiene: Performed;Modified independent Where Assessed - Toileting Clothing Manipulation and Hygiene: Sit to stand from 3-in-1 or  toilet Equipment Used: Rolling walker;Gait belt Transfers/Ambulation Related to ADLs: supervision with RW ADL Comments: Assist with LB dressing only to donn left sock over toes.   Pt demo's safe technique during standing components of ADLs and during functional mobility.  Educated pt on LB dressing technique of threading L LE through pants first, reverse process for doffing.    OT Diagnosis:    OT Problem List:   OT Treatment Interventions:     OT Goals(Current goals can be found in the care plan section) Acute Rehab OT Goals Patient Stated Goal: Go home  Visit Information  Last OT Received On: 09/27/13 Assistance Needed: +1 History of Present Illness: 57 year old female admitted with OA Lt. knee, s/p Lt. TKA         Prior Functioning     Home Living Family/patient expects to be discharged to:: Private residence Living Arrangements: Spouse/significant other Available Help at Discharge: Family Type of Home: House Home Access: Level entry Home Layout: One level Home Equipment: Environmental consultant - 2 wheels;Tub bench Prior Function Level of Independence: Independent         Vision/Perception     Cognition  Cognition Arousal/Alertness: Awake/alert Behavior During Therapy: WFL for tasks assessed/performed Overall Cognitive Status: Within Functional Limits for tasks assessed    Extremity/Trunk Assessment Upper Extremity Assessment Upper Extremity Assessment: Overall WFL for tasks assessed     Mobility Bed Mobility Bed Mobility: Sit to Supine Sit to Supine: 6: Modified independent (Device/Increase time) Transfers Transfers: Sit to Stand;Stand to Sit Sit to Stand: 5: Supervision;From bed;From chair/3-in-1;With upper extremity assist Stand to Sit: 5: Supervision;To bed;To chair/3-in-1 Details for Transfer Assistance: Pt demonstrated safe technique     Exercise     Balance     End of Session OT - End of Session Equipment Utilized During Treatment: Gait belt;Rolling  walker Activity Tolerance: Patient tolerated treatment well Patient left: in bed;with call bell/phone within reach;in CPM CPM Left Knee CPM Left Knee: On Left Knee Flexion (Degrees): 65 Left Knee Extension (Degrees): 0  GO   09/27/2013 Cipriano Mile OTR/L Pager [phone] Office [phone]   Cipriano Mile 09/27/2013, 5:04 PM

## 2013-09-27 NOTE — Progress Notes (Signed)
Subjective: 1 Day Post-Op Procedure(s) (LRB): TOTAL KNEE ARTHROPLASTY- LEFT (Left) Patient reports pain as 3 on 0-10 scale.   Voiding without difficulty No nausea  Objective: Vital signs in last 24 hours: Temp:  [98 F (36.7 C)-98.7 F (37.1 C)] 98.7 F (37.1 C) (11/01 0601) Pulse Rate:  [80-98] 80 (11/01 0601) Resp:  [9-20] 18 (11/01 0601) BP: (103-125)/(63-85) 125/82 mmHg (11/01 0601) SpO2:  [88 %-100 %] 99 % (11/01 0601) Weight:  [82.555 kg (182 lb)] 82.555 kg (182 lb) (10/31 1700)  Intake/Output from previous day: 10/31 0701 - 11/01 0700 In: 3190 [P.O.:240; I.V.:2950] Out: 1555 [Urine:1430; Blood:125] Intake/Output this shift: Total I/O In: 320 [P.O.:320] Out: -    Recent Labs  09/26/13 1230 09/27/13 0400  HGB 11.6* 10.1*    Recent Labs  09/26/13 1230 09/27/13 0400  WBC 12.4* 12.3*  RBC 3.74* 3.31*  HCT 33.6* 30.0*  PLT 264 252    Recent Labs  09/26/13 1230 09/27/13 0400  NA  --  138  K  --  4.8  CL  --  105  CO2  --  26  BUN  --  12  CREATININE 0.77 0.74  GLUCOSE  --  154*  CALCIUM  --  8.6   No results found for this basename: LABPT, INR,  in the last 72 hours  Neurovascular intact Dorsiflexion/Plantar flexion intact Compartment soft Out of bed with min assist Drain removed without difficulty  Assessment/Plan: 1 Day Post-Op Procedure(s) (LRB): TOTAL KNEE ARTHROPLASTY- LEFT (Left) Up with therapy D/C IV fluids Plan for discharge tomorrow  Jenalyn Girdner B 09/27/2013, 8:48 AM

## 2013-09-28 LAB — CBC
HCT: 27.7 % — ABNORMAL LOW (ref 36.0–46.0)
Hemoglobin: 9.2 g/dL — ABNORMAL LOW (ref 12.0–15.0)
MCHC: 33.2 g/dL (ref 30.0–36.0)
MCV: 92 fL (ref 78.0–100.0)
RBC: 3.01 MIL/uL — ABNORMAL LOW (ref 3.87–5.11)
WBC: 14.5 10*3/uL — ABNORMAL HIGH (ref 4.0–10.5)

## 2013-09-28 LAB — BASIC METABOLIC PANEL
BUN: 11 mg/dL (ref 6–23)
CO2: 29 mEq/L (ref 19–32)
Calcium: 8.8 mg/dL (ref 8.4–10.5)
Chloride: 102 mEq/L (ref 96–112)
Creatinine, Ser: 0.79 mg/dL (ref 0.50–1.10)
Glucose, Bld: 120 mg/dL — ABNORMAL HIGH (ref 70–99)

## 2013-09-28 MED ORDER — OXYCODONE HCL 5 MG PO TABS: 5.0000 mg | ORAL_TABLET | ORAL | Status: DC | PRN

## 2013-09-28 MED ORDER — OXYCODONE HCL ER 20 MG PO T12A: 20.0000 mg | EXTENDED_RELEASE_TABLET | Freq: Two times a day (BID) | ORAL | Status: DC

## 2013-09-28 MED ORDER — ENOXAPARIN SODIUM 30 MG/0.3ML ~~LOC~~ SOLN: 30.0000 mg | Freq: Two times a day (BID) | SUBCUTANEOUS | Status: DC

## 2013-09-28 MED ORDER — POLYETHYLENE GLYCOL 3350 17 G PO PACK: 17.0000 g | PACK | Freq: Every day | ORAL | Status: DC | PRN

## 2013-09-28 MED ORDER — DSS 100 MG PO CAPS: 100.0000 mg | ORAL_CAPSULE | Freq: Two times a day (BID) | ORAL | Status: DC

## 2013-09-28 NOTE — Progress Notes (Signed)
Subjective: 2 Days Post-Op Procedure(s) (LRB): TOTAL KNEE ARTHROPLASTY- LEFT (Left) Patient reports pain as 3 on 0-10 scale. No BM yet    Objective: Vital signs in last 24 hours: Temp:  [98.2 F (36.8 C)-98.5 F (36.9 C)] 98.2 F (36.8 C) (11/02 0702) Pulse Rate:  [82-100] 82 (11/02 0702) Resp:  [18] 18 (11/02 0702) BP: (117-140)/(72-93) 137/78 mmHg (11/02 0702) SpO2:  [96 %-98 %] 96 % (11/02 0702)  Intake/Output from previous day: 11/01 0701 - 11/02 0700 In: 1520 [P.O.:1400; I.V.:120] Out: -  Intake/Output this shift: Total I/O In: 560 [P.O.:560] Out: -    Recent Labs  09/26/13 1230 09/27/13 0400 09/28/13 0455  HGB 11.6* 10.1* 9.2*    Recent Labs  09/27/13 0400 09/28/13 0455  WBC 12.3* 14.5*  RBC 3.31* 3.01*  HCT 30.0* 27.7*  PLT 252 256    Recent Labs  09/27/13 0400 09/28/13 0455  NA 138 137  K 4.8 4.3  CL 105 102  CO2 26 29  BUN 12 11  CREATININE 0.74 0.79  GLUCOSE 154* 120*  CALCIUM 8.6 8.8   No results found for this basename: LABPT, INR,  in the last 72 hours  Neurovascular intact Incision: dressing C/D/I and scant drainage of dried blood at drain site Dressing changed without difficulty  Assessment/Plan: 2 Days Post-Op Procedure(s) (LRB): TOTAL KNEE ARTHROPLASTY- LEFT (Left) Up with therapy Discharge home with home health if PT goals met today  ROBERTS,JANE B 09/28/2013, 10:46 AM

## 2013-09-28 NOTE — Discharge Summary (Signed)
Physician Discharge Summary  Patient ID: [name] MRN: [id number] DOB/AGE: December 01, 1955 57 y.o.  Admit date: 09/26/2013 Discharge date: 09/28/2013  Admission Diagnoses:  Discharge Diagnoses:  Principal Problem:   Left knee DJD Active Problems:   CAD (coronary artery disease)   HTN (hypertension)   Hyperlipidemia   Anxiety   Tobacco abuse   Obesity   Arthritis of knee   Discharged Condition: stable  Hospital Course:  Patient underwent Left total knee arthroplasty on 09/26/13, with standard perioperative antibiotics, pain medication, and physical therapy. Hospital course was uneventful, with steady improvement toward Physical therapy goals for safe ambulation and transfer.    Consults: None  Significant Diagnostic Studies: labs: usual post op  Treatments: antibiotics: Ancef  Discharge Exam: Blood pressure 137/78, pulse 82, temperature 98.2 F (36.8 C), temperature source Oral, resp. rate 18, height 5\' 2"  (1.575 m), weight 82.555 kg (182 lb), SpO2 96.00%. Incision/Wound:clean and dry  Disposition: 01-Home or Self Care  Discharge Orders   Future Orders Complete By Expires   Call MD / Call 911  As directed    Comments:     If you experience chest pain or shortness of breath, CALL 911 and be transported to the hospital emergency room.  If you develope a fever above 101 F, pus (white drainage) or increased drainage or redness at the wound, or calf pain, call your surgeon's office.   Change dressing  As directed    Comments:     Change the dressing daily with sterile 4 x 4 inch gauze dressing and apply TED hose.  You may clean the incision with alcohol prior to redressing.   Constipation Prevention  As directed    Comments:     Drink plenty of fluids.  Prune juice may be helpful.  You may use a stool softener, such as Colace (over the counter) 100 mg twice a day.  Use MiraLax (over the counter) for constipation as needed.   CPM  As directed    Comments:     Continuous passive motion machine (CPM):      Use the CPM from 0 to 90 for 6 hours per day.       You may break it up into 2 or 3 sessions per day.      Use CPM for 2 weeks or until you are told to stop.   Diet - low sodium heart healthy  As directed    Discharge instructions  As directed    Comments:     Total Knee Replacement Care After Refer to this sheet in the next few weeks. These discharge instructions provide you with general information on caring for yourself after you leave the hospital. Your caregiver may also give you specific instructions. Your treatment has been planned according to the most current medical practices available, but unavoidable complications sometimes occur. If you have any problems or questions after discharge, please call your caregiver. Regaining a near full range of motion of your knee within the first 3 to 6 weeks after surgery is critical. HOME CARE INSTRUCTIONS  You may resume a normal diet and activities as directed.  Perform exercises as directed.  Place gray foam block, curve side up under heel at all times except when in CPM or when walking.  DO NOT modify, tear, cut, or change in any way the gray foam block. You will receive physical therapy daily  Take showers instead of baths until informed otherwise.  You may shower on Sunday.  Please  wash whole leg including wound with soap and water  Change bandages (dressings)daily It is OK to take over-the-counter tylenol in addition to the oxycodone for pain, discomfort, or fever. Oxycodone is VERY constipating.  Please take stool softener twice a day and laxatives daily until bowels are regular Eat a well-balanced diet.  Avoid lifting or driving until you are instructed otherwise.  Make an appointment to see your caregiver for stitches (suture) or staple removal as directed.  If you have been sent home with a continuous passive motion machine (CPM machine), 0-90 degrees 6 hrs a day   2 hrs a shift SEEK MEDICAL  CARE IF: You have swelling of your calf or leg.  You develop shortness of breath or chest pain.  You have redness, swelling, or increasing pain in the wound.  There is pus or any unusual drainage coming from the surgical site.  You notice a bad smell coming from the surgical site or dressing.  The surgical site breaks open after sutures or staples have been removed.  There is persistent bleeding from the suture or staple line.  You are getting worse or are not improving.  You have any other questions or concerns.  SEEK IMMEDIATE MEDICAL CARE IF:  You have a fever.  You develop a rash.  You have difficulty breathing.  You develop any reaction or side effects to medicines given.  Your knee motion is decreasing rather than improving.  MAKE SURE YOU:  Understand these instructions.  Will watch your condition.  Will get help right away if you are not doing well or get worse.   Do not put a pillow under the knee. Place it under the heel.  As directed    Comments:     Place gray foam block, curve side up under heel at all times except when in CPM or when walking.  DO NOT modify, tear, cut, or change in any way the gray foam block.   Increase activity slowly as tolerated  As directed    TED hose  As directed    Comments:     Use stockings (TED hose) for 2 weeks on both leg(s).  You may remove them at night for sleeping.       Medication List    STOP taking these medications       HYDROcodone-acetaminophen 5-325 MG per tablet  Commonly known as:  NORCO/VICODIN      TAKE these medications       aspirin 81 MG EC tablet  Take 81 mg by mouth daily.     CARDIZEM CD 120 MG 24 hr capsule  Generic drug:  diltiazem  Take 120 mg by mouth daily.     fish oil-omega-3 fatty acids 1000 MG capsule  Take 1 g by mouth daily.     isosorbide mononitrate 30 MG 24 hr tablet  Commonly known as:  IMDUR  Take 1 tablet (30 mg total) by mouth daily.     lisinopril-hydrochlorothiazide 10-12.5 MG  per tablet  Commonly known as:  PRINZIDE,ZESTORETIC  Take 1 tablet by mouth daily.     multivitamin tablet  Take 1 tablet by mouth daily.     mupirocin ointment 2 %  Commonly known as:  BACTROBAN  Apply 1 application topically 2 (two) times daily.     nitroGLYCERIN 0.4 MG SL tablet  Commonly known as:  NITROSTAT  Place 0.4 mg under the tongue every 5 (five) minutes as needed for chest pain.  pantoprazole 40 MG tablet  Commonly known as:  PROTONIX  Take 40 mg by mouth 2 (two) times daily.     pilocarpine 5 MG tablet  Commonly known as:  SALAGEN  Take 5 mg by mouth 3 (three) times daily.     venlafaxine XR 150 MG 24 hr capsule  Commonly known as:  EFFEXOR-XR  Take 150 mg by mouth daily.     zolpidem 10 MG tablet  Commonly known as:  AMBIEN  Take 10 mg by mouth at bedtime.           Follow-up Information   Follow up with CAFFREY JR,W D, MD On 10/10/2013. (appt time 1:30 pm)    Specialty:  Orthopedic Surgery   Contact information:   [address] [place] [address] [phone]       Signed: Dan Humphreys B 09/28/2013, 11:17 AM

## 2013-09-28 NOTE — Progress Notes (Signed)
Physical Therapy Treatment Patient Details Name: [name] MRN: [id number] DOB: [birth year] Today's Date: 09/28/2013 Time: 1191-4782 PT Time Calculation (min): 27 min  PT Assessment / Plan / Recommendation  History of Present Illness 57 year old female admitted with OA Lt. knee, s/p Lt. TKA     PT Comments   Moving well, and showing good stance stability L knee; Addressed car transfer; OK for dc home from PT standpoint  Follow Up Recommendations  Home health PT;Supervision/Assistance - 24 hour     Does the patient have the potential to tolerate intense rehabilitation     Barriers to Discharge        Equipment Recommendations  None recommended by PT    Recommendations for Other Services    Frequency 7X/week   Progress towards PT Goals Progress towards PT goals: Progressing toward goals  Plan Current plan remains appropriate    Precautions / Restrictions Precautions Precautions: Fall Required Braces or Orthoses: Knee Immobilizer - Left Restrictions Weight Bearing Restrictions: No LLE Weight Bearing: Weight bearing as tolerated   Pertinent Vitals/Pain 5/10 L knee; ice applied with education for skin protection     Mobility  Bed Mobility Bed Mobility: Supine to Sit Supine to Sit: 5: Supervision Details for Bed Mobility Assistance: Cues for technique; smooth transition Transfers Transfers: Sit to Stand;Stand to Sit Sit to Stand: 5: Supervision;From bed;From chair/3-in-1;With upper extremity assist Stand to Sit: 5: Supervision;To bed;To chair/3-in-1 Details for Transfer Assistance: Pt demonstrated safe technique Ambulation/Gait Ambulation/Gait Assistance: 5: Supervision Ambulation Distance (Feet): 150 Feet Assistive device: Rolling walker Ambulation/Gait Assistance Details: Cues for posture and L stance stability; No knee buckling noted; Pt seemed lpeased Gait Pattern: Step-through pattern (emerging step-through) Gait velocity: decr Stairs: Yes Stairs  Assistance: 4: Min guard Stair Management Technique: No rails;Step to pattern;Backwards;Forwards;With walker (To simulate getting in a [employer]) Number of Stairs: 1 (2 reps)    Exercises Total Joint Exercises Quad Sets: AROM;Left;10 reps Heel Slides: AROM;Left;10 reps;Supine Straight Leg Raises: AROM;Strengthening;Left;10 reps   PT Diagnosis:    PT Problem List:   PT Treatment Interventions:     PT Goals (current goals can now be found in the care plan section) Acute Rehab PT Goals Patient Stated Goal: Go home  Visit Information  Last PT Received On: 09/28/13 Assistance Needed: +1 History of Present Illness: 57 year old female admitted with OA Lt. knee, s/p Lt. TKA      Subjective Data  Subjective: Hoping to go home today Patient Stated Goal: Go home   Cognition  Cognition Arousal/Alertness: Awake/alert Behavior During Therapy: WFL for tasks assessed/performed Overall Cognitive Status: Within Functional Limits for tasks assessed    Balance     End of Session PT - End of Session Activity Tolerance: Patient tolerated treatment well Patient left: in chair;with call bell/phone within reach;with family/visitor present Nurse Communication: Other (comment) (OK for dc from PT standpoint) CPM Left Knee CPM Left Knee: Off   GP     Van Clines [place], [place] [phone]  09/28/2013, 12:58 PM

## 2013-09-28 NOTE — Care Management Note (Signed)
    Page 1 of 1   09/28/2013     5:56:51 PM   CARE MANAGEMENT NOTE 09/28/2013  Patient:  [name], [name]   Account Number:  [id number]  Date Initiated:  09/28/2013  Documentation initiated by:  [facility]  Subjective/Objective Assessment:   adm: OA LEFT KNEE     Action/Plan:   discharge   Anticipated DC Date:  09/28/2013   Anticipated DC Plan:  HOME W HOME HEALTH SERVICES      DC Planning Services  CM consult      [facility] Choice  HOME HEALTH   Choice offered to / List presented to:  C-3 Spouse        HH arranged  HH-2 PT      HH agency  [facility]   Status of service:  Completed, signed off Medicare Important Message given?   (If response is "NO", the following Medicare IM given date fields will be blank) Date Medicare IM given:   Date Additional Medicare IM given:    Discharge Disposition:  HOME W HOME HEALTH SERVICES  Per UR Regulation:    If discussed at Long Length of Stay Meetings, dates discussed:    Comments:  09/28/13 17:52 CM spoke with pt's spouse for choice.  He chooses [facility] for HHPT.  Contact number has changed to [phone].  Referral faxed to [facility] with correct contact number for HHPT.  No equipment needed.  No other CM needs communicated.  Freddy Jaksch, BSN, CM [phone].

## 2013-09-30 ENCOUNTER — Encounter (HOSPITAL_COMMUNITY): Payer: Self-pay | Admitting: Orthopedic Surgery

## 2014-02-10 ENCOUNTER — Ambulatory Visit: Payer: BC Managed Care – PPO | Admitting: Cardiology

## 2014-02-17 ENCOUNTER — Encounter (INDEPENDENT_AMBULATORY_CARE_PROVIDER_SITE_OTHER): Payer: Self-pay | Admitting: *Deleted

## 2014-02-23 ENCOUNTER — Encounter (INDEPENDENT_AMBULATORY_CARE_PROVIDER_SITE_OTHER): Payer: Self-pay | Admitting: *Deleted

## 2014-03-02 ENCOUNTER — Telehealth (INDEPENDENT_AMBULATORY_CARE_PROVIDER_SITE_OTHER): Payer: Self-pay | Admitting: *Deleted

## 2014-03-02 ENCOUNTER — Encounter (INDEPENDENT_AMBULATORY_CARE_PROVIDER_SITE_OTHER): Payer: Self-pay | Admitting: Internal Medicine

## 2014-03-02 ENCOUNTER — Ambulatory Visit (INDEPENDENT_AMBULATORY_CARE_PROVIDER_SITE_OTHER): Payer: BC Managed Care – PPO | Admitting: Internal Medicine

## 2014-03-02 VITALS — BP 120/80 | HR 80 | Temp 98.0°F | Ht 62.0 in | Wt 183.9 lb

## 2014-03-02 DIAGNOSIS — Z1211 Encounter for screening for malignant neoplasm of colon: Secondary | ICD-10-CM

## 2014-03-02 DIAGNOSIS — K219 Gastro-esophageal reflux disease without esophagitis: Secondary | ICD-10-CM

## 2014-03-02 DIAGNOSIS — Z800 Family history of malignant neoplasm of digestive organs: Secondary | ICD-10-CM | POA: Insufficient documentation

## 2014-03-02 HISTORY — DX: Gastro-esophageal reflux disease without esophagitis: K21.9

## 2014-03-02 MED ORDER — PEG-KCL-NACL-NASULF-NA ASC-C 100 G PO SOLR: 1.0000 | Freq: Once | ORAL | Status: DC

## 2014-03-02 NOTE — Progress Notes (Signed)
Subjective:     Patient ID: [name], female   DOB: 03-23-1956, 58 y.o.   MRN: [id number]  HPI Referred to our office by Dr. Rory Percy for reflux. She has had this reflux for several months (since October). In February, she was seen in the ED and her cardiac work up negative. She was evaluated in the ED and released. She was admitted to [facility] on 02/05/2014 with a diagnosis of infectious colitis. She was having rectal bleeding in her stools. Her BMs were black with bright red rectal bleeding. Her stool was negative for c-difficile.  Her stool was negative for blood.  She had had diarrhea the night before admission. She also says she had some nausea.The diarrhea started on 02/01/2014. She was having multiple stools (over 10). She had a fever around 99.1 when she was admitted. She underwent a CT which revealed colitis centered in the proximal descending segment. Favor infection. The distribution would also be typical of ischemia, which is secondary concern. Hepatomegaly and hepatic steatosis. Irregular hepatic contour for which mild cirrhosis cannot be excluded. Age advanced coronary artery atherosclerosis. Esophageal air fluid level suggests dysmotility or GE reflux. Prominent left inguinal nodes, similar to the 01/06/2014 exam. Today she c/o epigastric pain. She has frequent acid reflux. She has reflux especially at night.  She tells me she is 75% better.  Acid reflux is not controlled with the Zantac. There is no hematemesis.  Foods are not lodging in her esophagus.   She is having 3 stools a day. Stools are formed. Some days she will not have a stool. Appetite is good. No weight loss. No melena or bright red rectal bleeding.  02/08/2014 H andH 14.5 and 43.0, WBC 9.8, MCV 85.3, Platelet ct 299, AST 24, ALT 30, ALP 74, Albumin 4.6, Amylase 56, Lipase 17., Urine WBC 0-5,   Colonoscopy 03/03/2009, Dr. Laural Golden: High risk screening (father underwent surgery for colon carcinoma at age 25). Sigmoid  diverticulosis (moderate). Small submucosal lipoma at ascending colon which was left alone.  Review of Systems Married, Two children good health. Unemployed.     Objective:   Physical Exam  Filed Vitals:   03/02/14 1457  BP: 120/80  Pulse: 80  Temp: 98 F (36.7 C)  Height: 5\' 2"  (1.575 m)  Weight: 183 lb 14.4 oz (83.416 kg)   Alert and oriented. Skin warm and dry. Oral mucosa is moist.   . Sclera anicteric, conjunctivae is pink. Thyroid not enlarged. No cervical lymphadenopathy. Lungs clear. Heart regular rate and rhythm.  Abdomen is soft. Bowel sounds are positive. No hepatomegaly. No abdominal masses felt. Epigastric tenderness and pain over rt lower ribs.  tenderness.  No edema to lower extremities.       Assessment:    Epigastric tenderness. PUD needs to be ruled out. Family hx of colon cancer (Father). -    Plan:     EGD, High risk colonoscopy. The risks and benefits such as perforation, bleeding, and infection were reviewed with the patient and is agreeable.

## 2014-03-02 NOTE — Patient Instructions (Signed)
EGD/Colonoscopy. The risks and benefits such as perforation, bleeding, and infection were reviewed with the patient and is agreeable. 

## 2014-03-02 NOTE — Telephone Encounter (Signed)
Patient needs movi prep 

## 2014-03-03 ENCOUNTER — Other Ambulatory Visit (INDEPENDENT_AMBULATORY_CARE_PROVIDER_SITE_OTHER): Payer: Self-pay | Admitting: *Deleted

## 2014-03-03 ENCOUNTER — Telehealth (INDEPENDENT_AMBULATORY_CARE_PROVIDER_SITE_OTHER): Payer: Self-pay | Admitting: *Deleted

## 2014-03-03 DIAGNOSIS — Z800 Family history of malignant neoplasm of digestive organs: Secondary | ICD-10-CM

## 2014-03-03 DIAGNOSIS — K219 Gastro-esophageal reflux disease without esophagitis: Secondary | ICD-10-CM

## 2014-03-03 NOTE — Telephone Encounter (Signed)
I have added these medications to her Allergies

## 2014-03-03 NOTE — Telephone Encounter (Signed)
The antibiotic she is allergic to is  metaxalone ciprofloxacin. Please call in the antibiotic she needs to take before her procedure. The return phone number is  [phone].

## 2014-03-20 ENCOUNTER — Encounter (HOSPITAL_COMMUNITY): Payer: Self-pay | Admitting: Pharmacy Technician

## 2014-04-01 ENCOUNTER — Ambulatory Visit (HOSPITAL_COMMUNITY)
Admission: RE | Admit: 2014-04-01 | Discharge: 2014-04-01 | Disposition: A | Discharge status: Home / Self Care | Payer: BC Managed Care – PPO | Source: Ambulatory Visit | Attending: Internal Medicine | Admitting: Internal Medicine

## 2014-04-01 ENCOUNTER — Encounter (HOSPITAL_COMMUNITY): Payer: Self-pay | Admitting: *Deleted

## 2014-04-01 ENCOUNTER — Encounter (HOSPITAL_COMMUNITY)
Admission: RE | Disposition: A | Discharge status: Home / Self Care | Payer: Self-pay | Source: Ambulatory Visit | Attending: Internal Medicine

## 2014-04-01 DIAGNOSIS — Z87891 Personal history of nicotine dependence: Secondary | ICD-10-CM | POA: Insufficient documentation

## 2014-04-01 DIAGNOSIS — K648 Other hemorrhoids: Secondary | ICD-10-CM | POA: Insufficient documentation

## 2014-04-01 DIAGNOSIS — Z1211 Encounter for screening for malignant neoplasm of colon: Secondary | ICD-10-CM | POA: Insufficient documentation

## 2014-04-01 DIAGNOSIS — K219 Gastro-esophageal reflux disease without esophagitis: Secondary | ICD-10-CM

## 2014-04-01 DIAGNOSIS — I2510 Atherosclerotic heart disease of native coronary artery without angina pectoris: Secondary | ICD-10-CM | POA: Insufficient documentation

## 2014-04-01 DIAGNOSIS — Z800 Family history of malignant neoplasm of digestive organs: Secondary | ICD-10-CM

## 2014-04-01 DIAGNOSIS — K208 Other esophagitis without bleeding: Secondary | ICD-10-CM

## 2014-04-01 DIAGNOSIS — Z79899 Other long term (current) drug therapy: Secondary | ICD-10-CM | POA: Insufficient documentation

## 2014-04-01 DIAGNOSIS — Z883 Allergy status to other anti-infective agents status: Secondary | ICD-10-CM | POA: Insufficient documentation

## 2014-04-01 DIAGNOSIS — K644 Residual hemorrhoidal skin tags: Secondary | ICD-10-CM

## 2014-04-01 DIAGNOSIS — D126 Benign neoplasm of colon, unspecified: Secondary | ICD-10-CM | POA: Insufficient documentation

## 2014-04-01 DIAGNOSIS — Z882 Allergy status to sulfonamides status: Secondary | ICD-10-CM | POA: Insufficient documentation

## 2014-04-01 DIAGNOSIS — I6529 Occlusion and stenosis of unspecified carotid artery: Secondary | ICD-10-CM | POA: Insufficient documentation

## 2014-04-01 DIAGNOSIS — I10 Essential (primary) hypertension: Secondary | ICD-10-CM | POA: Insufficient documentation

## 2014-04-01 DIAGNOSIS — E785 Hyperlipidemia, unspecified: Secondary | ICD-10-CM | POA: Insufficient documentation

## 2014-04-01 DIAGNOSIS — F411 Generalized anxiety disorder: Secondary | ICD-10-CM | POA: Insufficient documentation

## 2014-04-01 DIAGNOSIS — Z888 Allergy status to other drugs, medicaments and biological substances status: Secondary | ICD-10-CM | POA: Insufficient documentation

## 2014-04-01 DIAGNOSIS — K449 Diaphragmatic hernia without obstruction or gangrene: Secondary | ICD-10-CM | POA: Insufficient documentation

## 2014-04-01 DIAGNOSIS — K5730 Diverticulosis of large intestine without perforation or abscess without bleeding: Secondary | ICD-10-CM | POA: Insufficient documentation

## 2014-04-01 HISTORY — PX: ESOPHAGOGASTRODUODENOSCOPY: SHX5428

## 2014-04-01 HISTORY — PX: COLONOSCOPY: SHX5424

## 2014-04-01 SURGERY — COLONOSCOPY
Anesthesia: Moderate Sedation

## 2014-04-01 MED ORDER — MEPERIDINE HCL 50 MG/ML IJ SOLN
INTRAMUSCULAR | Status: DC | PRN
  Administered 2014-04-01 (×2): 25 mg via INTRAVENOUS

## 2014-04-01 MED ORDER — BUTAMBEN-TETRACAINE-BENZOCAINE 2-2-14 % EX AERO
INHALATION_SPRAY | CUTANEOUS | Status: DC | PRN
  Administered 2014-04-01: 2 via TOPICAL

## 2014-04-01 MED ORDER — SODIUM CHLORIDE 0.9 % IV SOLN
INTRAVENOUS | Status: DC
  Administered 2014-04-01: 08:00:00 via INTRAVENOUS

## 2014-04-01 MED ORDER — SUCRALFATE 1 G PO TABS: 1.0000 g | ORAL_TABLET | Freq: Three times a day (TID) | ORAL | Status: DC

## 2014-04-01 MED ORDER — MIDAZOLAM HCL 5 MG/5ML IJ SOLN
INTRAMUSCULAR | Status: DC | PRN
  Administered 2014-04-01: 1 mg via INTRAVENOUS
  Administered 2014-04-01 (×2): 2 mg via INTRAVENOUS
  Administered 2014-04-01: 1 mg via INTRAVENOUS
  Administered 2014-04-01: 3 mg via INTRAVENOUS
  Administered 2014-04-01: 2 mg via INTRAVENOUS

## 2014-04-01 MED ORDER — DIPHENHYDRAMINE HCL 50 MG/ML IJ SOLN
INTRAMUSCULAR | Status: DC | PRN
  Administered 2014-04-01: 25 mg via INTRAVENOUS

## 2014-04-01 MED ORDER — CEFAZOLIN SODIUM-DEXTROSE 2-3 GM-% IV SOLR
2.0000 g | INTRAVENOUS | Status: AC
  Administered 2014-04-01: 2 g via INTRAVENOUS

## 2014-04-01 MED ORDER — MIDAZOLAM HCL 5 MG/5ML IJ SOLN
INTRAMUSCULAR | Status: AC
  Filled 2014-04-01: qty 10

## 2014-04-01 MED ORDER — MIDAZOLAM HCL 5 MG/5ML IJ SOLN
INTRAMUSCULAR | Status: AC
  Filled 2014-04-01: qty 5

## 2014-04-01 MED ORDER — CEFAZOLIN SODIUM-DEXTROSE 2-3 GM-% IV SOLR
INTRAVENOUS | Status: AC
  Filled 2014-04-01: qty 50

## 2014-04-01 MED ORDER — DEXTROSE 5 % IV SOLN: 2.0000 g | Freq: Once | INTRAVENOUS | Status: DC

## 2014-04-01 MED ORDER — STERILE WATER FOR IRRIGATION IR SOLN
Status: DC | PRN
  Administered 2014-04-01: 09:00:00

## 2014-04-01 MED ORDER — DIPHENHYDRAMINE HCL 50 MG/ML IJ SOLN
INTRAMUSCULAR | Status: AC
  Filled 2014-04-01: qty 1

## 2014-04-01 MED ORDER — MEPERIDINE HCL 50 MG/ML IJ SOLN
INTRAMUSCULAR | Status: AC
  Filled 2014-04-01: qty 1

## 2014-04-01 MED ORDER — RABEPRAZOLE SODIUM 20 MG PO TBEC: 20.0000 mg | DELAYED_RELEASE_TABLET | Freq: Every day | ORAL | Status: DC

## 2014-04-01 NOTE — Discharge Instructions (Signed)
Resume usual medications were discontinued pantoprazole. Rebaprazole 20 mg by mouth 30 minutes before breakfast and evening meal daily. Sucralfate 1 g by mouth 30-60 minutes before each meal and at bedtime. No driving for 24 hours. Physician will call with biopsy results   Gastrointestinal Endoscopy Care After Refer to this sheet in the next few weeks. These instructions provide you with information on caring for yourself after your procedure. Your caregiver may also give you more specific instructions. Your treatment has been planned according to current medical practices, but problems sometimes occur. Call your caregiver if you have any problems or questions after your procedure. HOME CARE INSTRUCTIONS  If you were given medicine to help you relax (sedative), do not drive, operate machinery, or sign important documents for 24 hours.  Avoid alcohol and hot or warm beverages for the first 24 hours after the procedure.  Only take over-the-counter or prescription medicines for pain, discomfort, or fever as directed by your caregiver. You may resume taking your normal medicines unless your caregiver tells you otherwise. Ask your caregiver when you may resume taking medicines that may cause bleeding, such as aspirin, clopidogrel, or warfarin.  You may return to your normal diet and activities on the day after your procedure, or as directed by your caregiver. Walking may help to reduce any bloated feeling in your abdomen.  Drink enough fluids to keep your urine clear or pale yellow.  You may gargle with salt water if you have a sore throat. SEEK IMMEDIATE MEDICAL CARE IF:  You have severe nausea or vomiting.  You have severe abdominal pain, abdominal cramps that last longer than 6 hours, or abdominal swelling (distention).  You have severe shoulder or back pain.  You have trouble swallowing.  You have shortness of breath, your breathing is shallow, or you are breathing faster than  normal.  You have a fever or a rapid heartbeat.  You vomit blood or material that looks like coffee grounds.  You have bloody, black, or tarry stools. MAKE SURE YOU:  Understand these instructions.  Will watch your condition.  Will get help right away if you are not doing well or get worse. Document Released: 06/27/2004 Document Revised: 05/14/2012 Document Reviewed: 02/13/2012 [facility] Patient Information 2014 [place], [place].

## 2014-04-01 NOTE — Op Note (Addendum)
EGD AND COLONOSCOPY PROCEDURE REPORT  PATIENT:  [name]  MR#:  [id number] Birthdate:  10/15/1956, 58 y.o., female Endoscopist:  Dr. Rogene Houston, MD Referred By:  Dr. Rory Percy, MD  Procedure Date: 04/01/2014  Procedure:   EGD & Colonoscopy  Indications:  Patient is 58 year old Caucasian female who has chronic GERD and intermittent double dose PPI who presents with daily epigastric pain described as burning. She has history of CAD and recent cardiac evaluation has been negative. She is undergoing diagnostic EGD followed by high-dose colonoscopy. Father had surgery for colon carcinoma at age 74. Patient was hospitalized 2 months ago at [facility] for left-sided colitis for which she is fully recovered.            Informed Consent:  The risks, benefits, alternatives & imponderables which include, but are not limited to, bleeding, infection, perforation, drug reaction and potential missed lesion have been reviewed.  The potential for biopsy, lesion removal, esophageal dilation, etc. have also been discussed.  Questions have been answered.  All parties agreeable.  Please see history & physical in medical record for more information.  Medications:  Demerol 50 mg IV Versed 11mg  IV Benadryl 25 mg IV for itching after she received Ancef. Cetacaine spray topically for oropharyngeal anesthesia  EGD  Description of procedure:  The endoscope was introduced through the mouth and advanced to the second portion of the duodenum without difficulty or limitations. The mucosal surfaces were surveyed very carefully during advancement of the scope and upon withdrawal.  Findings:  Esophagus:  Mucosa of the esophagus was normal. Focal edema and erythema noted at GE junction without stricture or ring formation. GEJ:  33cm Hiatus:  35 cm Stomach:  Stomach was empty and distended very well with insufflation. Folds in the proximal stomach were normal. Examination of  mucosa at gastric body, antrum, pyloric  channel, angularis, fundus and cardia was normal. Duodenum:  Normal bulbar and post bulbar mucosa.  Therapeutic/Diagnostic Maneuvers Performed:  None  COLONOSCOPY Description of procedure:  After a digital rectal exam was performed, that colonoscope was advanced from the anus through the rectum and colon to the area of the cecum, ileocecal valve and appendiceal orifice. The cecum was deeply intubated. These structures were well-seen and photographed for the record. From the level of the cecum and ileocecal valve, the scope was slowly and cautiously withdrawn. The mucosal surfaces were carefully surveyed utilizing scope tip to flexion to facilitate fold flattening as needed. The scope was pulled down into the rectum where a thorough exam including retroflexion was performed.  Findings:   Prep excellent. Small cecal polyp located across from ileocecal valve. It was ablated via cold biopsy. Single small diverticulum noted at hepatic flexure with few more at sigmoid colon. Normal rectal mucosa. Small hemorrhoids below the dentate line.  Therapeutic/Diagnostic Maneuvers Performed:  See above  Complications:  None  Cecal Withdrawal Time:  11 minutes  Impression:  EGD findings; Mild changes of reflux esophagitis limited to GE junction. Small sliding hiatal hernia. No evidence of peptic ulcer disease or gastritis.  Colonoscopy findings; Small cecal polyp ablated via cold biopsy. No evidence of residual colitis(left-sided colitis 8 weeks). Sigmoid colon diverticulosis with single diverticulum at hepatic flexure. Small external hemorrhoids.  Comment; No abnormality noted to account for patient's persistent epigastric pain.   Recommendations:  Discontinue pantoprazole. Rebaprazole 20 mg by mouth twice a day. Sucralfate 1 g a.c. and each bedtime. I will be contacting patient with biopsy results. Office visit in  one month.  Rogene Houston  04/01/2014 9:50 AM  CC: Dr. Rory Percy, MD  & Dr. Rayne Du ref. provider found

## 2014-04-01 NOTE — H&P (Signed)
[name] is an 58 y.o. female.   Chief Complaint: Patient's here for EGD and colonoscopy. HPI: Patient is 58 year old Caucasian female who presents or 3 months history of epigastric pain. She has history of GERD and has been on pantoprazole twice daily for 3 years. She says pantoprazole and is not helping this pain. She was also given Zantac when she was seen in the emergency room it didn't help. She's not sure if it is better or worse with meals. She denies nausea vomiting or dysphagia. She denies melena. She had noninvasive cardiac evaluation has been negative. She was hospitalized 2 months ago with rectal bleeding and left-sided colitis which is fully recovered from it. Family history is positive for colon carcinoma father with surgery at 94. Patient's last colonoscopy was in January 2010 revealing colonic diverticulosis.  Past Medical History  Diagnosis Date  . Hyperlipidemia   . Anxiety   . Carotid stenosis     Less than 50% 8/11  . CAD (coronary artery disease)     PCI Stent RCA 03/12/11.  Cath 03/22/11 LVEF 55-60%.  RCA patent  proximal/distal stents with 30% proximal disease and 20% mid stenosis.  LCX with 30%  mid stenosis.  Questionable spasm.  Marland Kitchen HTN (hypertension)   . H/O hiatal hernia   . GERD (gastroesophageal reflux disease) 03/02/2014    Past Surgical History  Procedure Laterality Date  . Ectopic pregnancy surgery    . Tonsillectomy and adenoidectomy    . Cholecystectomy    . Left wrist    . Heart stent      2012  x 2 stents  . Cyst removed from spine    . Left knee      Arthroscopic  . Tubal ligation    . Total knee arthroplasty Left 09/26/2013    Procedure: TOTAL KNEE ARTHROPLASTY- LEFT;  Surgeon: [name]., MD;  Location: [place];  Service: Orthopedics;  Laterality: Left;    Family History  Problem Relation Age of Onset  . Heart disease    . Arthritis    . Cancer    . Colon cancer Father    Social History:  reports that she quit smoking about 3 years  ago. Her smoking use included Cigarettes. She has a 10 pack-year smoking history. She has never used smokeless tobacco. She reports that she drinks alcohol. She reports that she does not use illicit drugs.  Allergies:  Allergies  Allergen Reactions  . Ciprofloxacin Hcl   . Metaxalone   . Sulfa Antibiotics Itching, Rash and Other (See Comments)    "severe muscle cramps, tongue cracked, dry mouth "    Medications Prior to Admission  Medication Sig Dispense Refill  . Ascorbic Acid (VITAMIN C) 100 MG tablet Take 500 mg by mouth daily.      Marland Kitchen aspirin 81 MG tablet Take 81 mg by mouth daily.      . calcium-vitamin D 250-100 MG-UNIT per tablet Take 1 tablet by mouth 2 (two) times daily.       Marland Kitchen diltiazem (CARDIZEM CD) 120 MG 24 hr capsule Take 120 mg by mouth daily.      . fexofenadine (ALLEGRA) 180 MG tablet Take 180 mg by mouth daily.      . fish oil-omega-3 fatty acids 1000 MG capsule Take 1 g by mouth daily.      . Inulin (FIBER CHOICE FRUITY BITES PO) Take 1 tablet by mouth daily.       . isosorbide mononitrate (  IMDUR) 30 MG 24 hr tablet Take 1 tablet (30 mg total) by mouth daily.  30 tablet  12  . lisinopril-hydrochlorothiazide (PRINZIDE,ZESTORETIC) 10-12.5 MG per tablet Take 1 tablet by mouth daily.       . Multiple Vitamin (MULTIVITAMIN) tablet Take 1 tablet by mouth daily.      Marland Kitchen oxyCODONE-acetaminophen (PERCOCET/ROXICET) 5-325 MG per tablet Take by mouth every 4 (four) hours as needed for severe pain.      . pantoprazole (PROTONIX) 40 MG tablet Take 40 mg by mouth 2 (two) times daily.       . peg 3350 powder (MOVIPREP) 100 G SOLR Take 1 kit (200 g total) by mouth once.  1 kit  0  . polyethylene glycol (MIRALAX / GLYCOLAX) packet Take 17 g by mouth daily as needed.  14 each  0  . pregabalin (LYRICA) 150 MG capsule Take 150 mg by mouth daily.      . traMADol (ULTRAM) 50 MG tablet Take 50 mg by mouth every 6 (six) hours as needed.      . triamcinolone (NASACORT ALLERGY 24HR) 55 MCG/ACT  AERO nasal inhaler Place 2 sprays into the nose daily.      Marland Kitchen venlafaxine XR (EFFEXOR-XR) 150 MG 24 hr capsule Take 150 mg by mouth daily.      Marland Kitchen zolpidem (AMBIEN) 10 MG tablet Take 10 mg by mouth at bedtime.       . nitroGLYCERIN (NITROSTAT) 0.4 MG SL tablet Place 0.4 mg under the tongue every 5 (five) minutes as needed for chest pain.         No results found for this or any previous visit (from the past 48 hour(s)). No results found.  ROS  Blood pressure 120/86, pulse 83, temperature 98.2 F (36.8 C), temperature source Oral, resp. rate 22, height $RemoveBe'5\' 2"'SYrSphlaI$  (1.575 m), weight 180 lb (81.647 kg), SpO2 98.00%. Physical Exam  Constitutional: She appears well-developed and well-nourished.  HENT:  Mouth/Throat: Oropharynx is clear and moist.  Eyes: Conjunctivae are normal. No scleral icterus.  Neck: No thyromegaly present.  Cardiovascular: Normal rate, regular rhythm and normal heart sounds.   No murmur heard. Respiratory: Effort normal and breath sounds normal.  GI: Soft. She exhibits no distension. There is tenderness (mid epigastric tenderness).  Musculoskeletal: She exhibits no edema.  Lymphadenopathy:    She has no cervical adenopathy.  Neurological: She is alert.  Skin: Skin is warm and dry.     Assessment/Plan Epigastric pain unresponsive to therapy. History of left-sided colitis for which she has fully recovered. Diagnostic EGD and high risk screening colonoscopy.   Rogene Houston 04/01/2014, 8:47 AM

## 2014-04-07 ENCOUNTER — Encounter (HOSPITAL_COMMUNITY): Payer: Self-pay | Admitting: Internal Medicine

## 2014-04-11 ENCOUNTER — Other Ambulatory Visit (INDEPENDENT_AMBULATORY_CARE_PROVIDER_SITE_OTHER): Payer: Self-pay | Admitting: Internal Medicine

## 2014-04-11 MED ORDER — RABEPRAZOLE SODIUM 20 MG PO TBEC: 20.0000 mg | DELAYED_RELEASE_TABLET | Freq: Two times a day (BID) | ORAL | Status: DC

## 2014-04-14 ENCOUNTER — Telehealth (INDEPENDENT_AMBULATORY_CARE_PROVIDER_SITE_OTHER): Payer: Self-pay | Admitting: *Deleted

## 2014-04-14 NOTE — Telephone Encounter (Signed)
Per TCS/EGD op note, patient needs OV 1 month

## 2014-04-16 ENCOUNTER — Encounter (INDEPENDENT_AMBULATORY_CARE_PROVIDER_SITE_OTHER): Payer: Self-pay | Admitting: *Deleted

## 2014-04-16 NOTE — Telephone Encounter (Signed)
Apt has been scheduled for 05/05/14 with Deberah Castle, NP.

## 2014-04-22 ENCOUNTER — Ambulatory Visit (INDEPENDENT_AMBULATORY_CARE_PROVIDER_SITE_OTHER): Payer: BC Managed Care – PPO | Admitting: Cardiovascular Disease

## 2014-04-22 ENCOUNTER — Encounter: Payer: Self-pay | Admitting: Cardiovascular Disease

## 2014-04-22 VITALS — BP 121/89 | HR 83 | Ht 62.0 in | Wt 183.0 lb

## 2014-04-22 DIAGNOSIS — E669 Obesity, unspecified: Secondary | ICD-10-CM

## 2014-04-22 DIAGNOSIS — I2510 Atherosclerotic heart disease of native coronary artery without angina pectoris: Secondary | ICD-10-CM

## 2014-04-22 DIAGNOSIS — M25569 Pain in unspecified knee: Secondary | ICD-10-CM

## 2014-04-22 DIAGNOSIS — Z79899 Other long term (current) drug therapy: Secondary | ICD-10-CM

## 2014-04-22 DIAGNOSIS — E785 Hyperlipidemia, unspecified: Secondary | ICD-10-CM

## 2014-04-22 DIAGNOSIS — M25562 Pain in left knee: Secondary | ICD-10-CM

## 2014-04-22 DIAGNOSIS — R10816 Epigastric abdominal tenderness: Secondary | ICD-10-CM

## 2014-04-22 DIAGNOSIS — I10 Essential (primary) hypertension: Secondary | ICD-10-CM

## 2014-04-22 DIAGNOSIS — I739 Peripheral vascular disease, unspecified: Secondary | ICD-10-CM

## 2014-04-22 DIAGNOSIS — I779 Disorder of arteries and arterioles, unspecified: Secondary | ICD-10-CM

## 2014-04-22 MED ORDER — NITROGLYCERIN 0.4 MG SL SUBL: 0.4000 mg | SUBLINGUAL_TABLET | SUBLINGUAL | Status: DC | PRN

## 2014-04-22 MED ORDER — DILTIAZEM HCL ER COATED BEADS 120 MG PO CP24: 120.0000 mg | ORAL_CAPSULE | Freq: Every day | ORAL | Status: DC

## 2014-04-22 MED ORDER — LISINOPRIL-HYDROCHLOROTHIAZIDE 10-12.5 MG PO TABS: 1.0000 | ORAL_TABLET | Freq: Every day | ORAL | Status: DC

## 2014-04-22 MED ORDER — ISOSORBIDE MONONITRATE ER 30 MG PO TB24: 30.0000 mg | ORAL_TABLET | Freq: Every day | ORAL | Status: DC

## 2014-04-22 NOTE — Patient Instructions (Signed)
Continue all current medications. Your physician wants you to follow up in:  1 year.  You will receive a reminder letter in the mail one-two months in advance.  If you don't receive a letter, please call our office to schedule the follow up appointment   

## 2014-04-22 NOTE — Progress Notes (Signed)
Patient ID: [name], female   DOB: 09-Oct-1956, 58 y.o.   MRN: [id number]      SUBJECTIVE: The patient is a 58 year old female who is here today for a followup visit. This is my first meeting with her, as she had previously been followed by Dr. Percival Spanish, most recently in 03/2013. 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 She had 2 drug-eluting stent placements at that time. She had recurrent chest pain with cardiac catheterization not long after this. She had patent stents. She was suspected of having coronary spasm and has been treated with calcium channel blockers and long-acting nitroglycerin. Her ejection fraction is known to be normal. In December of 2012 she presented to [facility] with atypical chest pain. She was admitted for observation. She ruled out for a myocardial infarction. She underwent a nuclear stress test which showed no evidence of ischemia.   She has been doing well and denies chest pain, shortness of breath, palpitations, leg swelling, orthopnea, and paroxysmal nocturnal dyspnea. Her primary complaints revolve around left knee pain after undergoing left knee replacement surgery last year, as well as an epigastric tenderness that has been ongoing since prior to her knee surgery. She denies constipation but has had problems with diarrhea. She has seen Dr. Laural Golden for this and apparently endoscopies have been normal. She has also had issues with vaginal spotting but has had biopsies which have reportedly been normal.  She will be looking after her grandchildren this summer and plans to get in the pool, as she has been encouraged to do so by Dr. Nadara Mustard (PCP) as well.   Allergies  Allergen Reactions  . Ancef [Cefazolin] Itching    Itching around mouth - sx resolved quickly after administration of benadryl; with no further sx  . Ciprofloxacin Hcl   . Metaxalone   . Sulfa Antibiotics Itching, Rash and  Other (See Comments)    "severe muscle cramps, tongue cracked, dry mouth "    Current Outpatient Prescriptions  Medication Sig Dispense Refill  . Ascorbic Acid (VITAMIN C) 100 MG tablet Take 500 mg by mouth daily.      Marland Kitchen aspirin 81 MG tablet Take 81 mg by mouth daily.      . calcium-vitamin D 250-100 MG-UNIT per tablet Take 1 tablet by mouth 2 (two) times daily.       Marland Kitchen diltiazem (CARDIZEM CD) 120 MG 24 hr capsule Take 120 mg by mouth daily.      . fexofenadine (ALLEGRA) 180 MG tablet Take 180 mg by mouth daily.      . fish oil-omega-3 fatty acids 1000 MG capsule Take 1 g by mouth daily.      . Inulin (FIBER CHOICE FRUITY BITES PO) Take 1 tablet by mouth daily.       . isosorbide mononitrate (IMDUR) 30 MG 24 hr tablet Take 1 tablet (30 mg total) by mouth daily.  30 tablet  12  . lisinopril-hydrochlorothiazide (PRINZIDE,ZESTORETIC) 10-12.5 MG per tablet Take 1 tablet by mouth daily.       . Multiple Vitamin (MULTIVITAMIN) tablet Take 1 tablet by mouth daily.      . nitroGLYCERIN (NITROSTAT) 0.4 MG SL tablet Place 0.4 mg under the tongue every 5 (five) minutes as needed for chest pain.       Marland Kitchen oxyCODONE-acetaminophen (PERCOCET/ROXICET) 5-325 MG per tablet Take by mouth every 4 (four) hours as needed for severe pain.      Marland Kitchen  peg 3350 powder (MOVIPREP) 100 G SOLR Take 1 kit (200 g total) by mouth once.  1 kit  0  . pregabalin (LYRICA) 150 MG capsule Take 150 mg by mouth daily.      . RABEprazole (ACIPHEX) 20 MG tablet Take 1 tablet (20 mg total) by mouth 2 (two) times daily before a meal.  60 tablet  5  . sucralfate (CARAFATE) 1 G tablet Take 1 tablet (1 g total) by mouth 4 (four) times daily -  with meals and at bedtime.  120 tablet  1  . traMADol (ULTRAM) 50 MG tablet Take 50 mg by mouth every 6 (six) hours as needed.      . triamcinolone (NASACORT ALLERGY 24HR) 55 MCG/ACT AERO nasal inhaler Place 2 sprays into the nose daily.      Marland Kitchen venlafaxine XR (EFFEXOR-XR) 150 MG 24 hr capsule Take 150 mg  by mouth daily.      Marland Kitchen zolpidem (AMBIEN) 10 MG tablet Take 10 mg by mouth at bedtime.        No current facility-administered medications for this visit.    Past Medical History  Diagnosis Date  . Hyperlipidemia   . Anxiety   . Carotid stenosis     Less than 50% 8/11  . CAD (coronary artery disease)     PCI Stent RCA 03/12/11.  Cath 03/22/11 LVEF 55-60%.  RCA patent  proximal/distal stents with 30% proximal disease and 20% mid stenosis.  LCX with 30%  mid stenosis.  Questionable spasm.  Marland Kitchen HTN (hypertension)   . H/O hiatal hernia   . GERD (gastroesophageal reflux disease) 03/02/2014    Past Surgical History  Procedure Laterality Date  . Ectopic pregnancy surgery    . Tonsillectomy and adenoidectomy    . Cholecystectomy    . Left wrist    . Heart stent      2012  x 2 stents  . Cyst removed from spine    . Left knee      Arthroscopic  . Tubal ligation    . Total knee arthroplasty Left 09/26/2013    Procedure: TOTAL KNEE ARTHROPLASTY- LEFT;  Surgeon: [name]., MD;  Location: [place];  Service: Orthopedics;  Laterality: Left;  . Colonoscopy N/A 04/01/2014    Procedure: COLONOSCOPY;  Surgeon: Rogene Houston, MD;  [facility];  Service: Endoscopy;  Laterality: N/A;  100  . Esophagogastroduodenoscopy N/A 04/01/2014    Procedure: ESOPHAGOGASTRODUODENOSCOPY (EGD);  Surgeon: Rogene Houston, MD;  [facility];  Service: Endoscopy;  Laterality: N/A;  100    History   Social History  . Marital Status: Married    Spouse Name: N/A    Number of Children: N/A  . Years of Education: N/A   Occupational History  . Not on file.   Social History Main Topics  . Smoking status: Former Smoker -- 1.00 packs/day for 10 years    Types: Cigarettes    Quit date: 11/27/2010  . Smokeless tobacco: Never Used  . Alcohol Use: Yes     Comment: social  . Drug Use: No  . Sexual Activity: Not on file   Other Topics Concern  . Not on file   Social History Narrative  . No  narrative on file    BP 121/89  Pulse 83    PHYSICAL EXAM General: NAD Neck: No JVD, no thyromegaly. Lungs: Clear to auscultation bilaterally with normal respiratory effort. CV: Nondisplaced PMI.  Regular rate and  rhythm, normal S1/S2, no S3/S4, no murmur. No pretibial or periankle edema.  No carotid bruit.  Normal pedal pulses.  Abdomen: Soft, + epigastric tenderness, no rebound or guarding, no hepatosplenomegaly, no distention.  Neurologic: Alert and oriented x 3.  Psych: Normal affect. Extremities: No clubbing or cyanosis.   ECG: reviewed and available in electronic records.      ASSESSMENT AND PLAN: CAD (coronary artery disease) - She's having no new cardiovascular complaints. She will continue with aggressive risk reduction. No change to medical therapy.  HTN (hypertension) - Her blood pressure is well controlled. No change in therapy is indicated.   Hyperlipidemia - She has been intolerant of all statins with myalgias.  Tobacco abuse - She quit smoking in 2012.   Carotid stenosis - This was less then 40% bilaterally in the past. Will repeat surveillance Dopplers in 1 year.  Left knee pain - I encouraged her to participate in aquatherapy, which she plans on doing this summer in [place].  Epigastric tenderness - Imaging has reportedly been unrevealing. She does not have symptoms consistent with adhesions.  Dispo: f/u 1 year.  Kate Sable, M.D., F.A.C.C.

## 2014-05-05 ENCOUNTER — Encounter (INDEPENDENT_AMBULATORY_CARE_PROVIDER_SITE_OTHER): Payer: Self-pay | Admitting: Internal Medicine

## 2014-05-05 ENCOUNTER — Ambulatory Visit (INDEPENDENT_AMBULATORY_CARE_PROVIDER_SITE_OTHER): Payer: BC Managed Care – PPO | Admitting: Internal Medicine

## 2014-05-05 VITALS — BP 134/82 | HR 72 | Temp 98.0°F | Ht 62.0 in | Wt 184.1 lb

## 2014-05-05 DIAGNOSIS — K649 Unspecified hemorrhoids: Secondary | ICD-10-CM

## 2014-05-05 MED ORDER — HYDROCORTISONE 2.5 % RE CREA: 1.0000 "application " | TOPICAL_CREAM | Freq: Two times a day (BID) | RECTAL | Status: DC

## 2014-05-05 NOTE — Progress Notes (Signed)
Subjective:     Patient ID: [name], female   DOB: [birth year], 57 y.o.   MRN: [id number]  HPI Here today for f/u after recent EGD/Colonoscopy.  Taking Aciphex BID for her acid reflux.  She tells me she is doing good. Appetite is good. No weight loss. Acid reflux is controlled with the Aciphex. She does have some epigastric tenderness at times. Avoiding spicy foods for the most part. Her BMs are normal. She has at least 2 stools a day. No melena or bright red rectal bleeding. She does c/o hemorrhoidal pain.  See EGD/Colonoscopy below.  She is not exercising. She occasionally walks.      04/01/2014 EGD/Colonoscopy: Indications: Patient is 58 year old Caucasian female who has chronic GERD and intermittent double dose PPI who presents with daily epigastric pain described as burning. She has history of CAD and recent cardiac evaluation has been negative. She is undergoing diagnostic EGD followed by high-dose colonoscopy. Father had surgery for colon carcinoma at age 12. Patient was hospitalized 2 months ago at [facility] for left-sided colitis for which she is fully recovered.     Impression:  EGD findings;  Mild changes of reflux esophagitis limited to GE junction.  Small sliding hiatal hernia.  No evidence of peptic ulcer disease or gastritis.  Colonoscopy findings;  Small cecal polyp ablated via cold biopsy.  No evidence of residual colitis(left-sided colitis 8 weeks).  Sigmoid colon diverticulosis with single diverticulum at hepatic flexure.  Small external hemorrhoids.  Comment;  No abnormality noted to account for patient's persistent epigastric pain.  Biopsy;  Patient had small cecal polyp removed and it is sessile serrated polyp She is still having chest pain. Patient advised to take Rebaprazole 20 mg by mouth twice a day; new prescription    Review of Systems     Past Medical History  Diagnosis Date  . Hyperlipidemia   . Anxiety   . Carotid stenosis     Less than 50%  8/11  . CAD (coronary artery disease)     PCI Stent RCA 03/12/11.  Cath 03/22/11 LVEF 55-60%.  RCA patent  proximal/distal stents with 30% proximal disease and 20% mid stenosis.  LCX with 30%  mid stenosis.  Questionable spasm.  Marland Kitchen HTN (hypertension)   . H/O hiatal hernia   . GERD (gastroesophageal reflux disease) 03/02/2014    Past Surgical History  Procedure Laterality Date  . Ectopic pregnancy surgery    . Tonsillectomy and adenoidectomy    . Cholecystectomy    . Left wrist    . Heart stent      2012  x 2 stents  . Cyst removed from spine    . Left knee      Arthroscopic  . Tubal ligation    . Total knee arthroplasty Left 09/26/2013    Procedure: TOTAL KNEE ARTHROPLASTY- LEFT;  Surgeon: [name]., MD;  Location: [place];  Service: Orthopedics;  Laterality: Left;  . Colonoscopy N/A 04/01/2014    Procedure: COLONOSCOPY;  Surgeon: Rogene Houston, MD;  [facility];  Service: Endoscopy;  Laterality: N/A;  100  . Esophagogastroduodenoscopy N/A 04/01/2014    Procedure: ESOPHAGOGASTRODUODENOSCOPY (EGD);  Surgeon: Rogene Houston, MD;  [facility];  Service: Endoscopy;  Laterality: N/A;  100    Allergies  Allergen Reactions  . Ancef [Cefazolin] Itching    Itching around mouth - sx resolved quickly after administration of benadryl; with no further sx  . Ciprofloxacin Hcl   .  Metaxalone   . Sulfa Antibiotics Itching, Rash and Other (See Comments)    "severe muscle cramps, tongue cracked, dry mouth "    Current Outpatient Prescriptions on File Prior to Visit  Medication Sig Dispense Refill  . aspirin 81 MG tablet Take 81 mg by mouth daily.      Marland Kitchen diltiazem (CARDIZEM CD) 120 MG 24 hr capsule Take 1 capsule (120 mg total) by mouth daily.  30 capsule  11  . fexofenadine (ALLEGRA) 180 MG tablet Take 180 mg by mouth daily.      . fish oil-omega-3 fatty acids 1000 MG capsule Take 1 g by mouth daily.      . Inulin (FIBER CHOICE FRUITY BITES PO) Take 1 tablet by mouth  daily.       . isosorbide mononitrate (IMDUR) 30 MG 24 hr tablet Take 1 tablet (30 mg total) by mouth daily.  30 tablet  11  . lisinopril-hydrochlorothiazide (PRINZIDE,ZESTORETIC) 10-12.5 MG per tablet Take 1 tablet by mouth daily.  30 tablet  11  . Multiple Vitamin (MULTIVITAMIN) tablet Take 1 tablet by mouth daily.      . nitroGLYCERIN (NITROSTAT) 0.4 MG SL tablet Place 1 tablet (0.4 mg total) under the tongue every 5 (five) minutes as needed for chest pain.  25 tablet  3  . NON FORMULARY Apply 1-2 application topically 4 (four) times daily as needed. Baclofen 2% cyclobenzaprine 2% Diclofenac 3% lidocaine 2%      . RABEprazole (ACIPHEX) 20 MG tablet Take 1 tablet (20 mg total) by mouth 2 (two) times daily before a meal.  60 tablet  5  . sucralfate (CARAFATE) 1 G tablet Take 1 tablet (1 g total) by mouth 4 (four) times daily -  with meals and at bedtime.  120 tablet  1  . traMADol (ULTRAM) 50 MG tablet Take 50-100 mg by mouth every 6 (six) hours as needed.       . venlafaxine XR (EFFEXOR-XR) 150 MG 24 hr capsule Take 150 mg by mouth daily.      Marland Kitchen zolpidem (AMBIEN) 10 MG tablet Take 10 mg by mouth at bedtime.        No current facility-administered medications on file prior to visit.    Married, two children in good health. Unemployed. Objective:   Physical Exam  Filed Vitals:   05/05/14 1003  Height: 5\' 2"  (1.575 m)  Weight: 184 lb 1.6 oz (83.507 kg)   Alert and oriented. Skin warm and dry. Oral mucosa is moist.   . Sclera anicteric, conjunctivae is pink. Thyroid not enlarged. No cervical lymphadenopathy. Lungs clear. Heart regular rate and rhythm.  Abdomen is soft. Bowel sounds are positive. No hepatomegaly. No abdominal masses felt. No tenderness.  No edema to lower extremities.  Small hemorrhoid noted.            Plan:    GERD controlled at this time. No complaints. Will see in one year for f/u.  Rx for anusol Eprescribed to her pharmacy.

## 2014-05-05 NOTE — Patient Instructions (Signed)
Continue the Aciphex. OV in 1 yr.

## 2014-05-26 ENCOUNTER — Other Ambulatory Visit (HOSPITAL_COMMUNITY): Payer: Self-pay | Admitting: Internal Medicine

## 2014-05-26 NOTE — Telephone Encounter (Signed)
Per Dr.Rehman may fill with 2 additional refills. 

## 2014-10-24 ENCOUNTER — Other Ambulatory Visit (INDEPENDENT_AMBULATORY_CARE_PROVIDER_SITE_OTHER): Payer: Self-pay | Admitting: Internal Medicine

## 2014-11-26 ENCOUNTER — Encounter (INDEPENDENT_AMBULATORY_CARE_PROVIDER_SITE_OTHER): Payer: Self-pay

## 2015-02-04 IMAGING — CR DG CHEST 2V
2 series · 2 of 2 positions shown · non-contrast
Comparison: Chest x-ray of 03/23/2011

CLINICAL DATA: Preop for left knee replacement

EXAM:
CHEST  2 VIEW

[w chest pa]
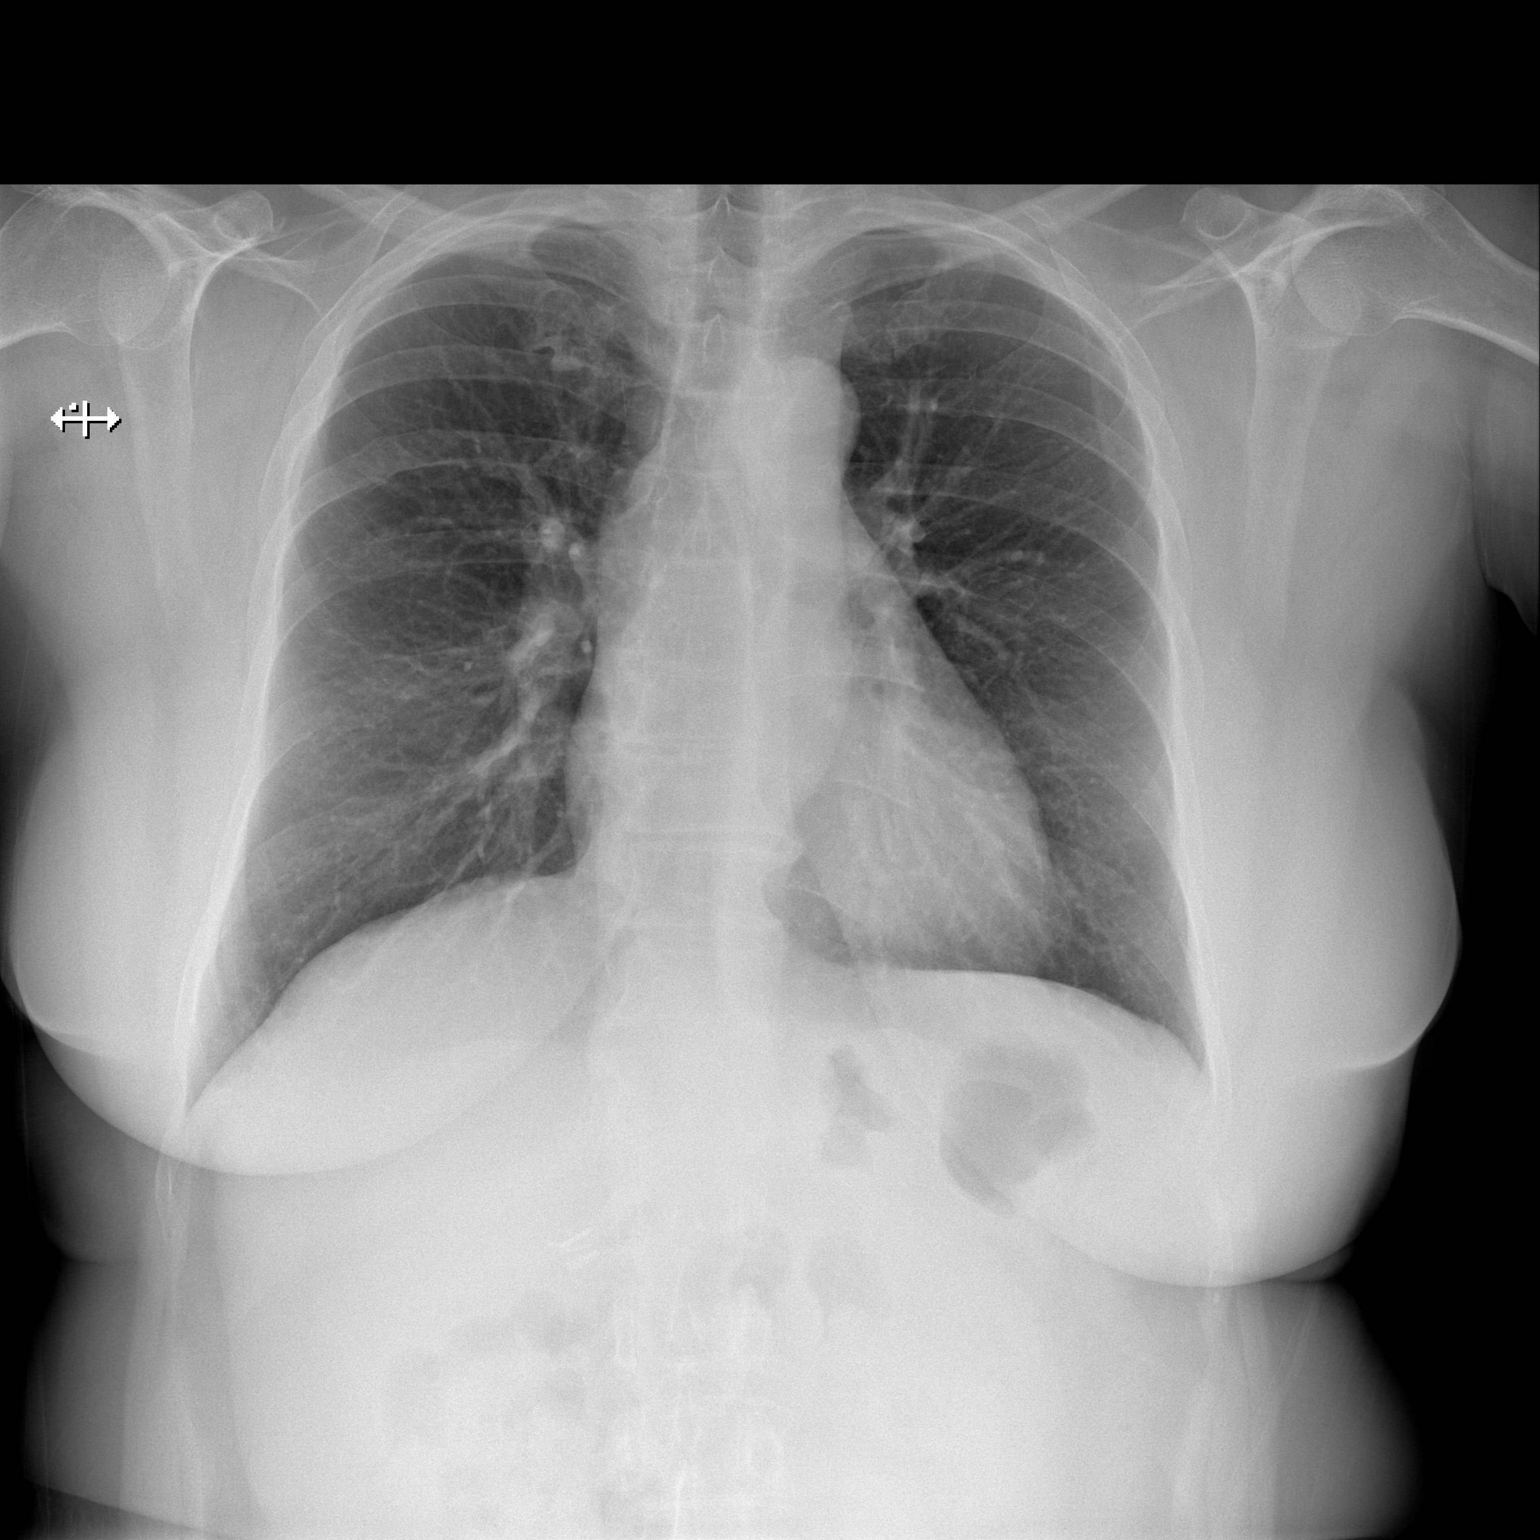

[w chest lat]
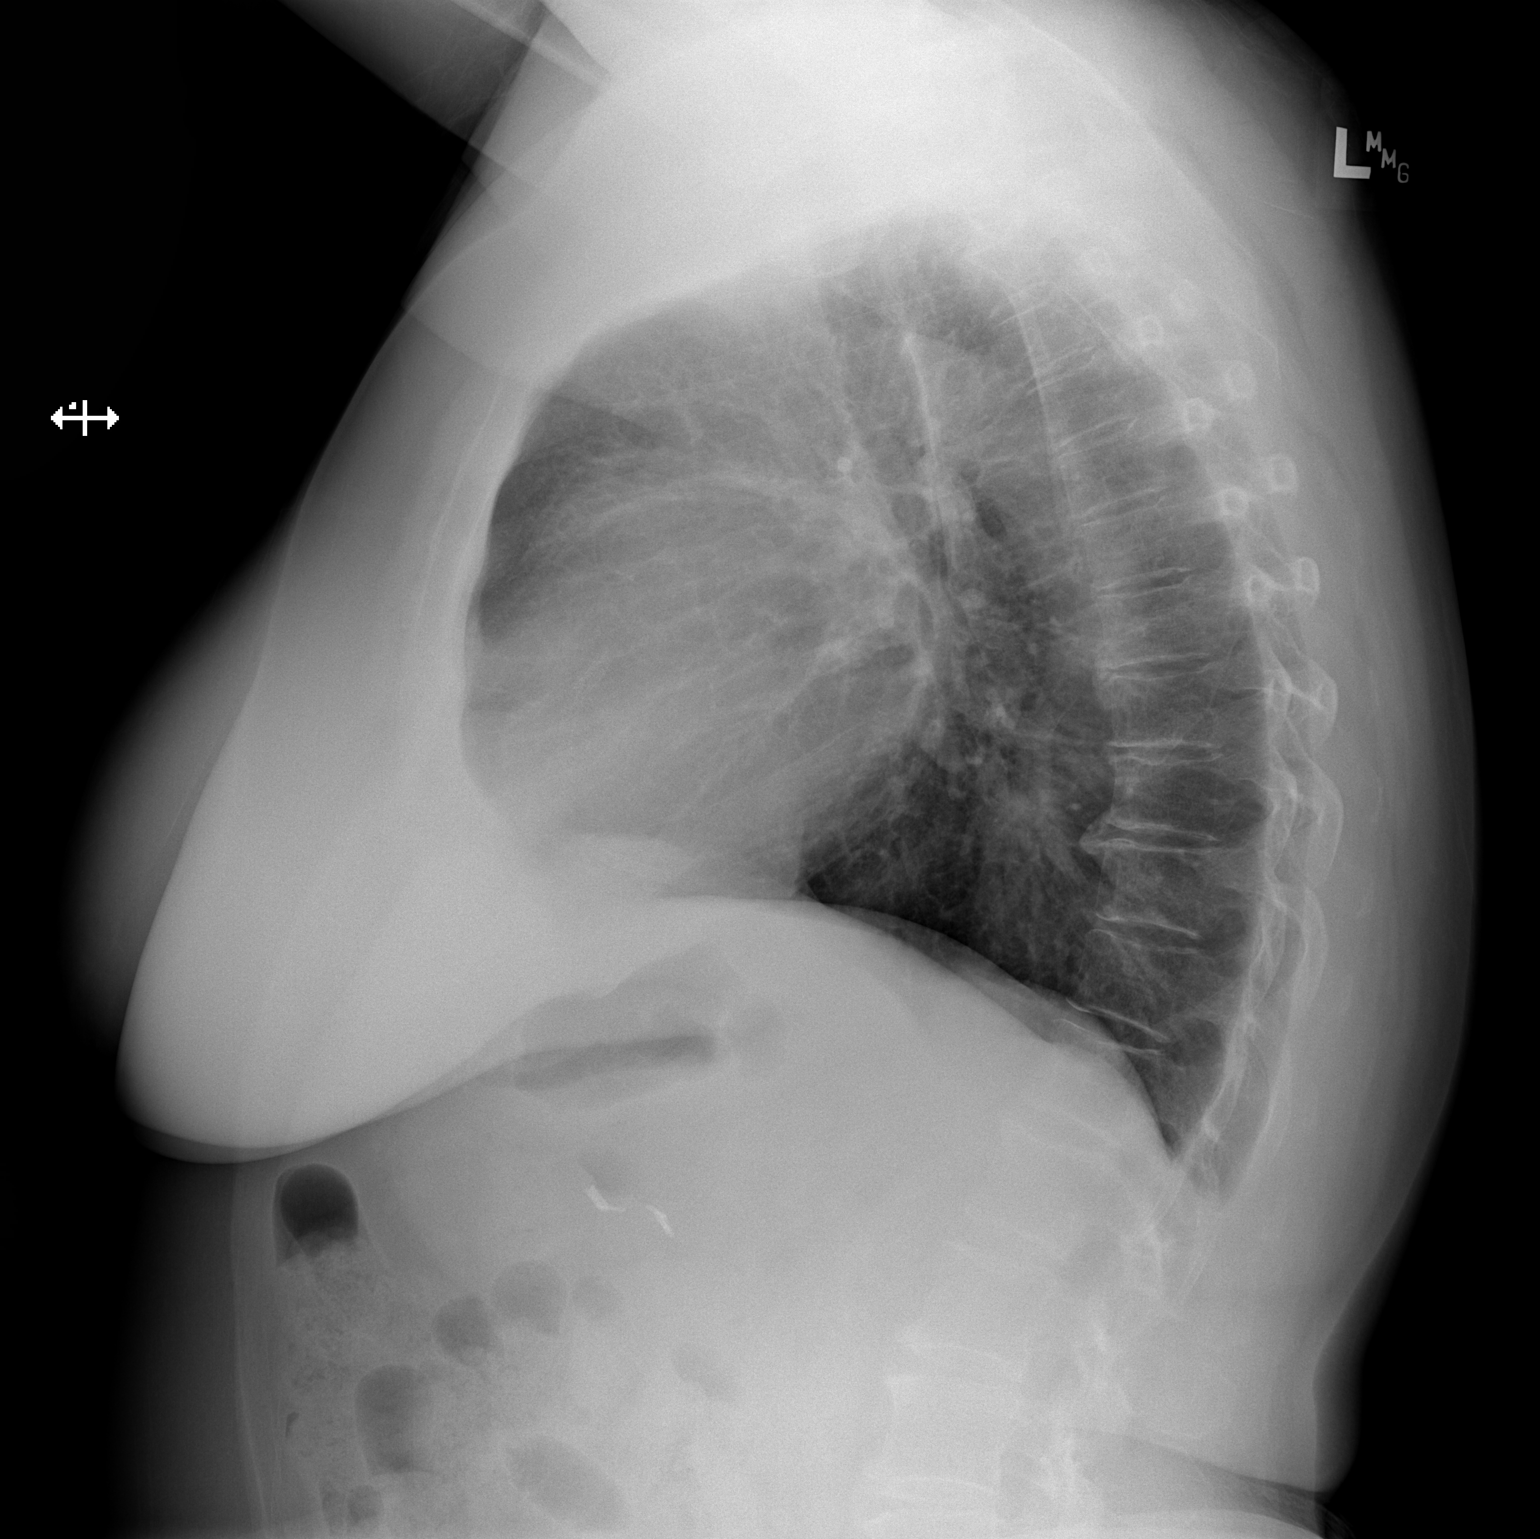

[2 of 2 positions shown; findings below may reference images not displayed]

FINDINGS: No active infiltrate or effusion is seen. Mediastinal contours
appear normal. Mild cardiomegaly is stable. There are degenerative
changes throughout the thoracic spine. Surgical clips are present in
the right upper quadrant from prior cholecystectomy.
IMPRESSION: No active lung disease.

## 2015-03-12 ENCOUNTER — Emergency Department (HOSPITAL_COMMUNITY)
Admission: EM | Admit: 2015-03-12 | Discharge: 2015-03-13 | Disposition: A | Discharge status: Home / Self Care | Payer: BLUE CROSS/BLUE SHIELD | Attending: Emergency Medicine | Admitting: Emergency Medicine

## 2015-03-12 ENCOUNTER — Encounter (HOSPITAL_COMMUNITY): Payer: Self-pay | Admitting: *Deleted

## 2015-03-12 DIAGNOSIS — K219 Gastro-esophageal reflux disease without esophagitis: Secondary | ICD-10-CM | POA: Insufficient documentation

## 2015-03-12 DIAGNOSIS — T7840XA Allergy, unspecified, initial encounter: Secondary | ICD-10-CM | POA: Insufficient documentation

## 2015-03-12 DIAGNOSIS — F419 Anxiety disorder, unspecified: Secondary | ICD-10-CM | POA: Diagnosis not present

## 2015-03-12 DIAGNOSIS — E785 Hyperlipidemia, unspecified: Secondary | ICD-10-CM | POA: Diagnosis not present

## 2015-03-12 DIAGNOSIS — Z87891 Personal history of nicotine dependence: Secondary | ICD-10-CM | POA: Insufficient documentation

## 2015-03-12 DIAGNOSIS — Y9389 Activity, other specified: Secondary | ICD-10-CM | POA: Insufficient documentation

## 2015-03-12 DIAGNOSIS — Y998 Other external cause status: Secondary | ICD-10-CM | POA: Diagnosis not present

## 2015-03-12 DIAGNOSIS — Z79899 Other long term (current) drug therapy: Secondary | ICD-10-CM | POA: Diagnosis not present

## 2015-03-12 DIAGNOSIS — Z7982 Long term (current) use of aspirin: Secondary | ICD-10-CM | POA: Diagnosis not present

## 2015-03-12 DIAGNOSIS — X58XXXA Exposure to other specified factors, initial encounter: Secondary | ICD-10-CM | POA: Diagnosis not present

## 2015-03-12 DIAGNOSIS — I10 Essential (primary) hypertension: Secondary | ICD-10-CM | POA: Diagnosis not present

## 2015-03-12 DIAGNOSIS — I2510 Atherosclerotic heart disease of native coronary artery without angina pectoris: Secondary | ICD-10-CM | POA: Diagnosis not present

## 2015-03-12 DIAGNOSIS — Y9289 Other specified places as the place of occurrence of the external cause: Secondary | ICD-10-CM | POA: Insufficient documentation

## 2015-03-12 DIAGNOSIS — Z7952 Long term (current) use of systemic steroids: Secondary | ICD-10-CM | POA: Insufficient documentation

## 2015-03-12 MED ORDER — DIPHENHYDRAMINE HCL 50 MG/ML IJ SOLN
25.0000 mg | Freq: Once | INTRAMUSCULAR | Status: AC
  Administered 2015-03-12: 25 mg via INTRAVENOUS

## 2015-03-12 MED ORDER — FAMOTIDINE IN NACL 20-0.9 MG/50ML-% IV SOLN
20.0000 mg | Freq: Once | INTRAVENOUS | Status: AC
  Administered 2015-03-12: 20 mg via INTRAVENOUS

## 2015-03-12 MED ORDER — EPINEPHRINE HCL 1 MG/ML IJ SOLN
INTRAMUSCULAR | Status: AC
  Filled 2015-03-12: qty 1

## 2015-03-12 MED ORDER — FAMOTIDINE IN NACL 20-0.9 MG/50ML-% IV SOLN
INTRAVENOUS | Status: AC
  Filled 2015-03-12: qty 50

## 2015-03-12 MED ORDER — DIPHENHYDRAMINE HCL 50 MG/ML IJ SOLN
INTRAMUSCULAR | Status: AC
  Filled 2015-03-12: qty 1

## 2015-03-12 MED ORDER — EPINEPHRINE HCL 1 MG/ML IJ SOLN
0.1000 mg | Freq: Once | INTRAMUSCULAR | Status: AC
  Administered 2015-03-12: 0.1 mg via INTRAVENOUS

## 2015-03-12 MED ORDER — METHYLPREDNISOLONE SODIUM SUCC 125 MG IJ SOLR
125.0000 mg | Freq: Once | INTRAMUSCULAR | Status: AC
  Administered 2015-03-12: 125 mg via INTRAVENOUS

## 2015-03-12 MED ORDER — METHYLPREDNISOLONE SODIUM SUCC 125 MG IJ SOLR
INTRAMUSCULAR | Status: AC
  Filled 2015-03-12: qty 2

## 2015-03-12 NOTE — ED Provider Notes (Signed)
CSN: [id number]     Arrival date & time 03/12/15  2241 History   First MD Initiated Contact with Patient 03/12/15 2249     Chief Complaint  Patient presents with  . Allergic Reaction     (Consider location/radiation/quality/duration/timing/severity/associated sxs/prior Treatment) HPI This is a 59 year old female who had the sudden onset of allergic reaction about an hour prior to arrival. She is not aware what the trigger was, though it could've been something her dog had come in contact with. It is characterized as generalized hives that are very itchy, swelling of the earlobes and shortness of breath. Symptoms are moderate to severe. She has not had nausea, vomiting or diarrhea. She does not have throat swelling. She took 50 milligrams of Benadryl by mouth about 30 minutes prior to arrival but is not sure if it has had any effect yet. She does not have a history of similar reactions in the past.  Past Medical History  Diagnosis Date  . Hyperlipidemia   . Anxiety   . Carotid stenosis     Less than 50% 8/11  . CAD (coronary artery disease)     PCI Stent RCA 03/12/11.  Cath 03/22/11 LVEF 55-60%.  RCA patent  proximal/distal stents with 30% proximal disease and 20% mid stenosis.  LCX with 30%  mid stenosis.  Questionable spasm.  Marland Kitchen HTN (hypertension)   . H/O hiatal hernia   . GERD (gastroesophageal reflux disease) 03/02/2014   Past Surgical History  Procedure Laterality Date  . Ectopic pregnancy surgery    . Tonsillectomy and adenoidectomy    . Cholecystectomy    . Left wrist    . Heart stent      2012  x 2 stents  . Cyst removed from spine    . Left knee      Arthroscopic  . Tubal ligation    . Total knee arthroplasty Left 09/26/2013    Procedure: TOTAL KNEE ARTHROPLASTY- LEFT;  Surgeon: [name]., MD;  Location: [place];  Service: Orthopedics;  Laterality: Left;  . Colonoscopy N/A 04/01/2014    Procedure: COLONOSCOPY;  Surgeon: Rogene Houston, MD;  [facility];   Service: Endoscopy;  Laterality: N/A;  100  . Esophagogastroduodenoscopy N/A 04/01/2014    Procedure: ESOPHAGOGASTRODUODENOSCOPY (EGD);  Surgeon: Rogene Houston, MD;  [facility];  Service: Endoscopy;  Laterality: N/A;  100   Family History  Problem Relation Age of Onset  . Heart disease    . Arthritis    . Cancer    . Colon cancer Father    History  Substance Use Topics  . Smoking status: Former Smoker -- 1.00 packs/day for 10 years    Types: Cigarettes    Quit date: 11/27/2010  . Smokeless tobacco: Never Used  . Alcohol Use: Yes     Comment: social   OB History    No data available     Review of Systems  All other systems reviewed and are negative.   Allergies  Ancef; Ciprofloxacin hcl; Metaxalone; and Sulfa antibiotics  Home Medications   Prior to Admission medications   Medication Sig Start Date End Date Taking? Authorizing Provider  aspirin 81 MG tablet Take 81 mg by mouth daily.    Historical Provider, MD  diltiazem (CARDIZEM CD) 120 MG 24 hr capsule Take 1 capsule (120 mg total) by mouth daily. 04/22/14   Herminio Commons, MD  fexofenadine (ALLEGRA) 180 MG tablet Take 180 mg by mouth  daily.    Historical Provider, MD  fish oil-omega-3 fatty acids 1000 MG capsule Take 1 g by mouth daily.    Historical Provider, MD  hydrocortisone (ANUSOL-HC) 2.5 % rectal cream Place 1 application rectally 2 (two) times daily. 05/05/14   Butch Penny, NP  Inulin (FIBER CHOICE FRUITY BITES PO) Take 1 tablet by mouth daily.     Historical Provider, MD  isosorbide mononitrate (IMDUR) 30 MG 24 hr tablet Take 1 tablet (30 mg total) by mouth daily. 04/22/14   Herminio Commons, MD  lisinopril-hydrochlorothiazide (PRINZIDE,ZESTORETIC) 10-12.5 MG per tablet Take 1 tablet by mouth daily. 04/22/14   Herminio Commons, MD  Multiple Vitamin (MULTIVITAMIN) tablet Take 1 tablet by mouth daily.    Historical Provider, MD  nitroGLYCERIN (NITROSTAT) 0.4 MG SL tablet Place 1 tablet (0.4  mg total) under the tongue every 5 (five) minutes as needed for chest pain. 04/22/14   Herminio Commons, MD  NON FORMULARY Apply 1-2 application topically 4 (four) times daily as needed. Baclofen 2% cyclobenzaprine 2% Diclofenac 3% lidocaine 2%    Historical Provider, MD  RABEprazole (ACIPHEX) 20 MG tablet TAKE 1 TABLET (20 MG TOTAL) BY MOUTH 2 (TWO) TIMES DAILY BEFORE A MEAL. 10/26/14   Rogene Houston, MD  sucralfate (CARAFATE) 1 G tablet TAKE 1 TABLET (1 G TOTAL) BY MOUTH 4 (FOUR) TIMES DAILY - WITH MEALS AND AT BEDTIME. 05/26/14   Rogene Houston, MD  traMADol (ULTRAM) 50 MG tablet Take 50-100 mg by mouth every 6 (six) hours as needed.     Historical Provider, MD  venlafaxine XR (EFFEXOR-XR) 150 MG 24 hr capsule Take 150 mg by mouth daily.    Historical Provider, MD  zolpidem (AMBIEN) 10 MG tablet Take 10 mg by mouth at bedtime.  12/07/11   Historical Provider, MD   BP 128/69 mmHg  Pulse 94  Temp(Src) 98 F (36.7 C) (Oral)  Resp 24  Ht 5\' 2"  (1.575 m)  Wt 161 lb (73.029 kg)  BMI 29.44 kg/m2  SpO2 96%   Physical Exam  General: Well-developed, well-nourished female in no acute distress; appearance consistent with age of record HENT: normocephalic; atraumatic; no dysphonia; no stridor Eyes: pupils equal, round and reactive to light; extraocular muscles intact Neck: supple Heart: regular rate and rhythm Lungs: clear to auscultation bilaterally; mild tachypnea Abdomen: soft; nondistended; nontender; no masses or hepatosplenomegaly; bowel sounds present Extremities: No deformity; full range of motion; pulses normal Neurologic: Awake, alert and oriented; motor function intact in all extremities and symmetric; no facial droop Skin: Warm and dry; generalized urticaria most prominent in the face, neck and upper chest with some confluence, erythema and edema of earlobes Psychiatric: Anxious    ED Course  Procedures (including critical care time)   MDM  12:12 AM Patient's symptoms and  urticaria are significantly improved after IV medications.     Shanon Rosser, MD 03/13/15 [phone]

## 2015-03-12 NOTE — ED Notes (Signed)
Pt states she was rubbing her dog and shortly after she began itching, breaking out in a rash, and feeling like her throat was closing up.

## 2015-03-13 MED ORDER — DIPHENHYDRAMINE HCL 25 MG PO TABS: 50.0000 mg | ORAL_TABLET | Freq: Four times a day (QID) | ORAL | Status: DC | PRN

## 2015-03-13 MED ORDER — EPINEPHRINE 0.3 MG/0.3ML IJ SOAJ: INTRAMUSCULAR | Status: DC

## 2015-03-13 NOTE — Discharge Instructions (Signed)

## 2015-03-17 ENCOUNTER — Encounter (INDEPENDENT_AMBULATORY_CARE_PROVIDER_SITE_OTHER): Payer: Self-pay | Admitting: *Deleted

## 2015-04-21 ENCOUNTER — Ambulatory Visit (INDEPENDENT_AMBULATORY_CARE_PROVIDER_SITE_OTHER): Payer: BLUE CROSS/BLUE SHIELD | Admitting: Cardiovascular Disease

## 2015-04-21 ENCOUNTER — Encounter: Payer: Self-pay | Admitting: Cardiovascular Disease

## 2015-04-21 VITALS — BP 130/83 | HR 84 | Ht 62.0 in | Wt 162.0 lb

## 2015-04-21 DIAGNOSIS — I2510 Atherosclerotic heart disease of native coronary artery without angina pectoris: Secondary | ICD-10-CM | POA: Diagnosis not present

## 2015-04-21 DIAGNOSIS — I25118 Atherosclerotic heart disease of native coronary artery with other forms of angina pectoris: Secondary | ICD-10-CM

## 2015-04-21 DIAGNOSIS — I10 Essential (primary) hypertension: Secondary | ICD-10-CM

## 2015-04-21 DIAGNOSIS — I779 Disorder of arteries and arterioles, unspecified: Secondary | ICD-10-CM | POA: Diagnosis not present

## 2015-04-21 DIAGNOSIS — E785 Hyperlipidemia, unspecified: Secondary | ICD-10-CM | POA: Diagnosis not present

## 2015-04-21 DIAGNOSIS — K219 Gastro-esophageal reflux disease without esophagitis: Secondary | ICD-10-CM

## 2015-04-21 DIAGNOSIS — I739 Peripheral vascular disease, unspecified: Secondary | ICD-10-CM

## 2015-04-21 NOTE — Progress Notes (Signed)
Patient ID: [name], female   DOB: 23-Apr-1956, 59 y.o.   MRN: [id number]      SUBJECTIVE:  The patient presents for routine follow up. 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 She had 2 drug-eluting stent placements at that time. She had recurrent chest pain with cardiac catheterization not long after this. She had patent stents. She was suspected of having coronary spasm and has been treated with calcium channel blockers and long-acting nitroglycerin. Her ejection fraction is known to be normal. In December of 2012 she presented to [facility] with atypical chest pain. She was admitted for observation. She ruled out for a myocardial infarction. She underwent a nuclear stress test which showed no evidence of ischemia.   She has been doing well for the most part and denies shortness of breath, palpitations, leg swelling, orthopnea, dysphagia, and paroxysmal nocturnal dyspnea.   While she has not been exercising, she has been eating better and has lost 20 pounds. She continues to have left knee pain although she previously underwent total knee replacement. She is in the process of helping to plan her daughter's wedding next May.   She had one episode last week of chest pain which she said was "severe" at approximately 7 in the morning. She took one sublingual nitroglycerin and 1 Aciphex table and her symptoms resolved. She has not had her cholesterol checked recently.  Review of Systems: As per "subjective", otherwise negative.  Allergies  Allergen Reactions  . Ancef [Cefazolin] Itching    Itching around mouth - sx resolved quickly after administration of benadryl; with no further sx  . Ciprofloxacin Hcl   . Metaxalone   . Sulfa Antibiotics Itching, Rash and Other (See Comments)    "severe muscle cramps, tongue cracked, dry mouth "    Current Outpatient Prescriptions  Medication Sig Dispense Refill  . Ascorbic  Acid (VITAMIN C) 1000 MG tablet Take 1,000 mg by mouth daily.    Marland Kitchen aspirin 81 MG tablet Take 81 mg by mouth daily.    . Calcium Carbonate (CALTRATE 600 PO) Take 1 tablet by mouth daily.    Marland Kitchen diltiazem (CARDIZEM CD) 120 MG 24 hr capsule Take 1 capsule (120 mg total) by mouth daily. 30 capsule 11  . diphenhydrAMINE (BENADRYL) 25 MG tablet Take 2 tablets (50 mg total) by mouth every 6 (six) hours as needed (itching or hives).    . DULoxetine (CYMBALTA) 60 MG capsule Take 60 mg by mouth every morning.  3  . EPINEPHrine 0.3 mg/0.3 mL IJ SOAJ injection Self inject per package instructions as needed for severe allergic reaction and seek medical attention. 2 Device 0  . fexofenadine (ALLEGRA) 180 MG tablet Take 180 mg by mouth daily.    . fish oil-omega-3 fatty acids 1000 MG capsule Take 1 g by mouth daily.    . furosemide (LASIX) 40 MG tablet Take 1 tablet by mouth daily.  12  . Inulin (FIBER CHOICE FRUITY BITES PO) Take 1 tablet by mouth daily.     . isosorbide mononitrate (IMDUR) 30 MG 24 hr tablet Take 1 tablet (30 mg total) by mouth daily. 30 tablet 11  . Multiple Vitamin (MULTIVITAMIN) tablet Take 1 tablet by mouth daily.    . Naphazoline-Pheniramine 0.027-0.315 % SOLN Apply 1 drop to eye 3 (three) times daily as needed.    . nitroGLYCERIN (NITROSTAT) 0.4 MG SL tablet Place 1 tablet (0.4 mg total) under the  tongue every 5 (five) minutes as needed for chest pain. 25 tablet 3  . Probiotic Product (PROBIOTIC DAILY PO) Take 1 tablet by mouth daily.    . RABEprazole (ACIPHEX) 20 MG tablet Take 1 tablet by mouth 2 (two) times daily. Patient take this daily since losing weight  8  . traMADol (ULTRAM) 50 MG tablet Take 100 mg by mouth 3 (three) times daily.      No current facility-administered medications for this visit.    Past Medical History  Diagnosis Date  . Hyperlipidemia   . Anxiety   . Carotid stenosis     Less than 50% 8/11  . CAD (coronary artery disease)     PCI Stent RCA 03/12/11.   Cath 03/22/11 LVEF 55-60%.  RCA patent  proximal/distal stents with 30% proximal disease and 20% mid stenosis.  LCX with 30%  mid stenosis.  Questionable spasm.  Marland Kitchen HTN (hypertension)   . H/O hiatal hernia   . GERD (gastroesophageal reflux disease) 03/02/2014    Past Surgical History  Procedure Laterality Date  . Ectopic pregnancy surgery    . Tonsillectomy and adenoidectomy    . Cholecystectomy    . Left wrist    . Heart stent      2012  x 2 stents  . Cyst removed from spine    . Left knee      Arthroscopic  . Tubal ligation    . Total knee arthroplasty Left 09/26/2013    Procedure: TOTAL KNEE ARTHROPLASTY- LEFT;  Surgeon: [name]., MD;  Location: [place];  Service: Orthopedics;  Laterality: Left;  . Colonoscopy N/A 04/01/2014    Procedure: COLONOSCOPY;  Surgeon: Rogene Houston, MD;  [facility];  Service: Endoscopy;  Laterality: N/A;  100  . Esophagogastroduodenoscopy N/A 04/01/2014    Procedure: ESOPHAGOGASTRODUODENOSCOPY (EGD);  Surgeon: Rogene Houston, MD;  [facility];  Service: Endoscopy;  Laterality: N/A;  100    History   Social History  . Marital Status: Married    Spouse Name: N/A  . Number of Children: N/A  . Years of Education: N/A   Occupational History  . Not on file.   Social History Main Topics  . Smoking status: Former Smoker -- 1.00 packs/day for 10 years    Types: Cigarettes    Start date: 06/27/1979    Quit date: 11/27/2010  . Smokeless tobacco: Never Used  . Alcohol Use: 0.0 oz/week    0 Standard drinks or equivalent per week     Comment: social  . Drug Use: No  . Sexual Activity: Not on file   Other Topics Concern  . Not on file   Social History Narrative     Filed Vitals:   04/21/15 0859  BP: 130/83  Pulse: 84  Height: 5\' 2"  (1.575 m)  Weight: 162 lb (73.483 kg)    PHYSICAL EXAM General: NAD HEENT: Normal. Neck: No JVD, no thyromegaly. Lungs: Clear to auscultation bilaterally with normal respiratory  effort. CV: Nondisplaced PMI.  Regular rate and rhythm, normal S1/S2, no S3/S4, no murmur. No pretibial or periankle edema.  No carotid bruit.  Normal pedal pulses.  Abdomen: Soft, nontender, no hepatosplenomegaly, no distention.  Neurologic: Alert and oriented x 3.  Psych: Normal affect. Skin: Normal. Musculoskeletal: Normal range of motion, no gross deformities. Extremities: No clubbing or cyanosis.   ECG: Most recent ECG reviewed.      ASSESSMENT AND PLAN: 1. CAD: Symptomatically stable. Given only  one episode of chest discomfort in several months, will not increase Imdur at this time. Continue with aggressive risk reduction. Will check lipids to see if she would benefit from non-statin therapy.  2. Essential hypertension: Blood pressure is well controlled. No change in therapy is indicated.   3. Hyperlipidemia: She has been intolerant of all statins with myalgias. Will obtain lipids to see if she would benefit from non-statin therapy.   4. Carotid stenosis: This was less then 40% bilaterally in the past. Will repeat surveillance Dopplers.  5. Weight loss: Encouraged on her success. Also encouraged to exercise.  Dispo: f/u 6 months.  Kate Sable, M.D., F.A.C.C.

## 2015-04-21 NOTE — Patient Instructions (Addendum)
Your physician wants you to follow-up in: 6 months with Dr. Virgina Jock will receive a reminder letter in the mail two months in advance. If you don't receive a letter, please call our office to schedule the follow-up appointment.  Your physician recommends that you continue on your current medications as directed. Please refer to the Current Medication list given to you today.  Your physician recommends that you return for lab work LIPIDS - PLEASE FAST PRIOR TO BLOOD WORK  Your physician has requested that you have a carotid duplex. This test is an ultrasound of the carotid arteries in your neck. It looks at blood flow through these arteries that supply the brain with blood. Allow one hour for this exam. There are no restrictions or special instructions.  Thank you for choosing [facility]!!

## 2015-04-23 ENCOUNTER — Telehealth: Payer: Self-pay | Admitting: Cardiovascular Disease

## 2015-04-23 NOTE — Telephone Encounter (Signed)
[name] is returning a call to [facility]. She said that if she has any instructions regarding recent blood work to please call her Home # and leave a detail message.

## 2015-04-27 MED ORDER — ROSUVASTATIN CALCIUM 5 MG PO TABS: 5.0000 mg | ORAL_TABLET | Freq: Every day | ORAL | Status: DC

## 2015-04-27 NOTE — Telephone Encounter (Signed)
Notes Recorded by [name], LPN on 4/92/0100 at 71:21 PM Had tried Lipitor in the past, but had to stop due to excessive muscle aches & no energy. Stated she had tried Crestor before, but at 20mg . She is willing to try 5mg . Will send new prescription to [facility] & mail reminder when labs are due in 3 months. She will notify office before then if she can not tolerate this dose.  Notes Recorded by Herminio Commons, MD on 04/22/2015 at 12:18 PM LDL markedly elevated and requires treatment, as major risk factor for CAD. What statins has she tried before and at what doses? Would she be willing to try Crestor, initially at 5 mg? If so, would repeat lipids 3 months afterwards.

## 2015-05-06 ENCOUNTER — Encounter (INDEPENDENT_AMBULATORY_CARE_PROVIDER_SITE_OTHER): Payer: Self-pay | Admitting: Internal Medicine

## 2015-05-06 ENCOUNTER — Ambulatory Visit (INDEPENDENT_AMBULATORY_CARE_PROVIDER_SITE_OTHER): Payer: BLUE CROSS/BLUE SHIELD | Admitting: Internal Medicine

## 2015-05-06 VITALS — BP 138/82 | HR 72 | Temp 98.4°F | Ht 62.0 in | Wt 159.2 lb

## 2015-05-06 DIAGNOSIS — K219 Gastro-esophageal reflux disease without esophagitis: Secondary | ICD-10-CM

## 2015-05-06 NOTE — Patient Instructions (Signed)
Continue present medications. OV in 1 year. 

## 2015-05-06 NOTE — Progress Notes (Addendum)
Subjective:    Patient ID: [name], female    DOB: 12/09/1955, 59 y.o.   MRN: [id number]  HPI Here today for f/u. [name] was last seen in June of 2016. She underwent an EGD/Colooscopy last year.  Hx of chronic GERD. She tells me she is doing great. She is more active. She has a garden and has a Programmer, multimedia garden. She says she is eating healthier. She has lost 24 pounds since her last. She denies any GERD symptoms. Her appetite is good. No abdominal pain. She usually has a BM. No melena or BRRB.  Patient looks great today.     04/01/2014 EGD/Colonoscopy: Indications: Patient is 59 year old Caucasian female who has chronic GERD and intermittent double dose PPI who presents with daily epigastric pain described as burning. She has history of CAD and recent cardiac evaluation has been negative. She is undergoing diagnostic EGD followed by high-dose colonoscopy. Father had surgery for colon carcinoma at age 60. Patient was hospitalized 2 months ago at [facility] for left-sided colitis for which she is fully recovered.  Impression:  EGD findings;  Mild changes of reflux esophagitis limited to GE junction.  Small sliding hiatal hernia.  No evidence of peptic ulcer disease or gastritis.  Colonoscopy findings;  Small cecal polyp ablated via cold biopsy.  No evidence of residual colitis(left-sided colitis 8 weeks).  Sigmoid colon diverticulosis with single diverticulum at hepatic flexure.  Small external hemorrhoids.  Comment;  No abnormality noted to account for patient's persistent epigastric pain.  Biopsy;  Patient had small cecal polyp removed and it is sessile serrated polyp She is still having chest pain. Patient advised to take Rebaprazole 20 mg by mouth twice a day; new prescription   Review of Systems Past Medical History  Diagnosis Date  . Hyperlipidemia   . Anxiety   . Carotid stenosis     Less than 50% 8/11  . CAD (coronary artery disease)     PCI Stent RCA 03/12/11.   Cath 03/22/11 LVEF 55-60%.  RCA patent  proximal/distal stents with 30% proximal disease and 20% mid stenosis.  LCX with 30%  mid stenosis.  Questionable spasm.  Marland Kitchen HTN (hypertension)   . H/O hiatal hernia   . GERD (gastroesophageal reflux disease) 03/02/2014    Past Surgical History  Procedure Laterality Date  . Ectopic pregnancy surgery    . Tonsillectomy and adenoidectomy    . Cholecystectomy    . Left wrist    . Heart stent      2012  x 2 stents  . Cyst removed from spine    . Left knee      Arthroscopic  . Tubal ligation    . Total knee arthroplasty Left 09/26/2013    Procedure: TOTAL KNEE ARTHROPLASTY- LEFT;  Surgeon: [name]., MD;  Location: [facility];  Service: Orthopedics;  Laterality: Left;  . Colonoscopy N/A 04/01/2014    Procedure: COLONOSCOPY;  Surgeon: Rogene Houston, MD;  [facility];  Service: Endoscopy;  Laterality: N/A;  100  . Esophagogastroduodenoscopy N/A 04/01/2014    Procedure: ESOPHAGOGASTRODUODENOSCOPY (EGD);  Surgeon: Rogene Houston, MD;  [facility];  Service: Endoscopy;  Laterality: N/A;  100    Allergies  Allergen Reactions  . Ancef [Cefazolin] Itching    Itching around mouth - sx resolved quickly after administration of benadryl; with no further sx  . Ciprofloxacin Hcl   . Metaxalone   . Sulfa Antibiotics Itching, Rash and Other (See  Comments)    "severe muscle cramps, tongue cracked, dry mouth "    Current Outpatient Prescriptions on File Prior to Visit  Medication Sig Dispense Refill  . Ascorbic Acid (VITAMIN C) 1000 MG tablet Take 1,000 mg by mouth daily.    Marland Kitchen aspirin 81 MG tablet Take 81 mg by mouth daily.    . Calcium Carbonate (CALTRATE 600 PO) Take 1 tablet by mouth daily.    Marland Kitchen diltiazem (CARDIZEM CD) 120 MG 24 hr capsule Take 1 capsule (120 mg total) by mouth daily. 30 capsule 11  . diphenhydrAMINE (BENADRYL) 25 MG tablet Take 2 tablets (50 mg total) by mouth every 6 (six) hours as needed (itching or hives).    .  DULoxetine (CYMBALTA) 60 MG capsule Take 60 mg by mouth every morning.  3  . EPINEPHrine 0.3 mg/0.3 mL IJ SOAJ injection Self inject per package instructions as needed for severe allergic reaction and seek medical attention. 2 Device 0  . fexofenadine (ALLEGRA) 180 MG tablet Take 180 mg by mouth daily.    . fish oil-omega-3 fatty acids 1000 MG capsule Take 1 g by mouth daily.    . furosemide (LASIX) 40 MG tablet Take 1 tablet by mouth daily.  12  . Inulin (FIBER CHOICE FRUITY BITES PO) Take 1 tablet by mouth daily.     . isosorbide mononitrate (IMDUR) 30 MG 24 hr tablet Take 1 tablet (30 mg total) by mouth daily. 30 tablet 11  . Multiple Vitamin (MULTIVITAMIN) tablet Take 1 tablet by mouth daily.    . Naphazoline-Pheniramine 0.027-0.315 % SOLN Apply 1 drop to eye 3 (three) times daily as needed.    . nitroGLYCERIN (NITROSTAT) 0.4 MG SL tablet Place 1 tablet (0.4 mg total) under the tongue every 5 (five) minutes as needed for chest pain. 25 tablet 3  . Probiotic Product (PROBIOTIC DAILY PO) Take 1 tablet by mouth daily.    . RABEprazole (ACIPHEX) 20 MG tablet Take 1 tablet by mouth 2 (two) times daily. Patient take this daily since losing weight  8  . rosuvastatin (CRESTOR) 5 MG tablet Take 1 tablet (5 mg total) by mouth daily. 30 tablet 6  . traMADol (ULTRAM) 50 MG tablet Take 100 mg by mouth 3 (three) times daily.      No current facility-administered medications on file prior to visit.        Objective:   Physical ExamBlood pressure 138/82, pulse 72, temperature 98.4 F (36.9 C), height 5\' 2"  (1.575 m), weight 159 lb 3.2 oz (72.213 kg).  Alert and oriented. Skin warm and dry. Oral mucosa is moist.   . Sclera anicteric, conjunctivae is pink. Thyroid not enlarged. No cervical lymphadenopathy. Lungs clear. Heart regular rate and rhythm.  Abdomen is soft. Bowel sounds are positive. No hepatomegaly. No abdominal masses felt. No tenderness.  No edema to lower extremities.         Assessment  & Plan:  GERD controlled at this time. No epigastric pain. Patient has had weight loss which was intentional. She is eating healthy. OV in 1 yr.

## 2015-05-12 ENCOUNTER — Ambulatory Visit (INDEPENDENT_AMBULATORY_CARE_PROVIDER_SITE_OTHER): Payer: BLUE CROSS/BLUE SHIELD

## 2015-05-12 DIAGNOSIS — I25118 Atherosclerotic heart disease of native coronary artery with other forms of angina pectoris: Secondary | ICD-10-CM | POA: Diagnosis not present

## 2015-05-17 ENCOUNTER — Telehealth: Payer: Self-pay | Admitting: *Deleted

## 2015-05-17 ENCOUNTER — Other Ambulatory Visit: Payer: Self-pay | Admitting: Cardiovascular Disease

## 2015-05-17 MED ORDER — ISOSORBIDE MONONITRATE ER 30 MG PO TB24: 30.0000 mg | ORAL_TABLET | Freq: Every day | ORAL | Status: DC

## 2015-05-17 MED ORDER — DILTIAZEM HCL ER COATED BEADS 120 MG PO CP24: 120.0000 mg | ORAL_CAPSULE | Freq: Every day | ORAL | Status: DC

## 2015-05-17 NOTE — Telephone Encounter (Signed)
Notes Recorded by [name], LPN on 7/89/7847 at 8:41 PM Patient notified of results this morning.

## 2015-05-17 NOTE — Telephone Encounter (Signed)
isosorbide mononitrate (IMDUR) 30 MG 24 hr tablet  diltiazem (CARDIZEM CD) 120 MG 24 hr capsule  Medication     States she is going out of town tomorrow and needs today please

## 2015-05-17 NOTE — Telephone Encounter (Signed)
-----   Message from Lendon Colonel, NP sent at 05/13/2015  7:25 AM EDT ----- Reviewed. No changes. Follow up study in 2 years as recommended.

## 2015-08-06 ENCOUNTER — Encounter: Payer: Self-pay | Admitting: *Deleted

## 2015-08-06 ENCOUNTER — Other Ambulatory Visit: Payer: Self-pay | Admitting: *Deleted

## 2015-08-06 DIAGNOSIS — E785 Hyperlipidemia, unspecified: Secondary | ICD-10-CM

## 2015-10-20 ENCOUNTER — Encounter: Payer: Self-pay | Admitting: *Deleted

## 2015-10-26 ENCOUNTER — Encounter: Payer: Self-pay | Admitting: *Deleted

## 2015-11-04 ENCOUNTER — Telehealth: Payer: Self-pay | Admitting: *Deleted

## 2015-11-04 NOTE — Telephone Encounter (Signed)
Patient stated that she had recently been to office visit ([facility]) with her mother & had asked provider about herself.  Stated they felt like she would be a good candidate.  Stated that she has been unable to tolerate multiple cholesterol medications.  Lab work retrieved from [place].   Dr. Bronson Ing made aware & states that he will address at her next OV.  She is already scheduled for 11/17/2015.    Patient notified via voice mail.

## 2015-11-11 ENCOUNTER — Other Ambulatory Visit (INDEPENDENT_AMBULATORY_CARE_PROVIDER_SITE_OTHER): Payer: Self-pay | Admitting: Internal Medicine

## 2015-11-17 ENCOUNTER — Ambulatory Visit (INDEPENDENT_AMBULATORY_CARE_PROVIDER_SITE_OTHER): Payer: BLUE CROSS/BLUE SHIELD | Admitting: Cardiovascular Disease

## 2015-11-17 ENCOUNTER — Encounter: Payer: Self-pay | Admitting: Cardiovascular Disease

## 2015-11-17 VITALS — BP 130/100 | HR 94 | Ht 62.0 in | Wt 167.0 lb

## 2015-11-17 DIAGNOSIS — E782 Mixed hyperlipidemia: Secondary | ICD-10-CM

## 2015-11-17 DIAGNOSIS — I25118 Atherosclerotic heart disease of native coronary artery with other forms of angina pectoris: Secondary | ICD-10-CM

## 2015-11-17 DIAGNOSIS — I779 Disorder of arteries and arterioles, unspecified: Secondary | ICD-10-CM

## 2015-11-17 DIAGNOSIS — I10 Essential (primary) hypertension: Secondary | ICD-10-CM | POA: Diagnosis not present

## 2015-11-17 DIAGNOSIS — I739 Peripheral vascular disease, unspecified: Secondary | ICD-10-CM

## 2015-11-17 MED ORDER — EVOLOCUMAB 140 MG/ML ~~LOC~~ SOAJ: 140.0000 mg | SUBCUTANEOUS | Status: DC

## 2015-11-17 MED ORDER — ISOSORBIDE MONONITRATE ER 30 MG PO TB24: 30.0000 mg | ORAL_TABLET | Freq: Every day | ORAL | Status: DC

## 2015-11-17 MED ORDER — DILTIAZEM HCL ER COATED BEADS 120 MG PO CP24: 120.0000 mg | ORAL_CAPSULE | Freq: Every day | ORAL | Status: DC

## 2015-11-17 NOTE — Patient Instructions (Signed)
   Begin Repatha 140mg  every 2 weeks subq  Continue all other medications.   Your physician wants you to follow up in: 6 months.  You will receive a reminder letter in the mail one-two months in advance.  If you don't receive a letter, please call our office to schedule the follow up appointment

## 2015-11-17 NOTE — Progress Notes (Signed)
Patient ID: [name], female   DOB: 10-05-1956, 59 y.o.   MRN: [id number]      SUBJECTIVE: The patient presents for routine follow up. 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 She had 2 drug-eluting stent placements at that time. She had recurrent chest pain with cardiac catheterization not long after this. She had patent stents. She was suspected of having coronary spasm and has been treated with calcium channel blockers and long-acting nitroglycerin. Her ejection fraction is known to be normal. In December of 2012 she presented to [facility] with atypical chest pain. She was admitted for observation. She ruled out for a myocardial infarction. She underwent a nuclear stress test which showed no evidence of ischemia.   Carotid Dopplers showed mild stenosis of 1-39% bilaterally in June 2016.  She seldom has chest pains. She was unable to tolerate Crestor 5 mg. She denies shortness of breath and leg swelling. She has been struggling with short-term memory loss and wonders whether it is due to Ambien.  Review of Systems: As per "subjective", otherwise negative.  Allergies  Allergen Reactions  . Ancef [Cefazolin] Itching    Itching around mouth - sx resolved quickly after administration of benadryl; with no further sx  . Ciprofloxacin Hcl   . Metaxalone   . Sulfa Antibiotics Itching, Rash and Other (See Comments)    "severe muscle cramps, tongue cracked, dry mouth "    Current Outpatient Prescriptions  Medication Sig Dispense Refill  . Ascorbic Acid (VITAMIN C) 1000 MG tablet Take 1,000 mg by mouth daily.    Marland Kitchen aspirin 81 MG tablet Take 81 mg by mouth daily.    . Calcium Carbonate (CALTRATE 600 PO) Take 1 tablet by mouth daily.    Marland Kitchen diltiazem (CARDIZEM CD) 120 MG 24 hr capsule Take 1 capsule (120 mg total) by mouth daily. 30 capsule 11  . diphenhydrAMINE (BENADRYL) 25 MG tablet Take 2 tablets (50 mg total) by mouth  every 6 (six) hours as needed (itching or hives).    . DULoxetine (CYMBALTA) 60 MG capsule Take 60 mg by mouth every morning.  3  . EPINEPHrine 0.3 mg/0.3 mL IJ SOAJ injection Self inject per package instructions as needed for severe allergic reaction and seek medical attention. 2 Device 0  . fexofenadine (ALLEGRA) 180 MG tablet Take 180 mg by mouth daily.    . fish oil-omega-3 fatty acids 1000 MG capsule Take 1 g by mouth daily.    . furosemide (LASIX) 40 MG tablet Take 1 tablet by mouth daily.  12  . Inulin (FIBER CHOICE FRUITY BITES PO) Take 1 tablet by mouth daily.     . isosorbide mononitrate (IMDUR) 30 MG 24 hr tablet Take 1 tablet (30 mg total) by mouth daily. 30 tablet 11  . Multiple Vitamin (MULTIVITAMIN) tablet Take 1 tablet by mouth daily.    . Naphazoline-Pheniramine 0.027-0.315 % SOLN Apply 1 drop to eye 3 (three) times daily as needed.    . nitroGLYCERIN (NITROSTAT) 0.4 MG SL tablet Place 1 tablet (0.4 mg total) under the tongue every 5 (five) minutes as needed for chest pain. 25 tablet 3  . Probiotic Product (PROBIOTIC DAILY PO) Take 1 tablet by mouth daily.    . RABEprazole (ACIPHEX) 20 MG tablet Take 1 tablet by mouth 2 (two) times daily. Patient take this daily since losing weight  8  . RABEprazole (ACIPHEX) 20 MG tablet TAKE 1 TABLET (20  MG TOTAL) BY MOUTH 2 (TWO) TIMES DAILY BEFORE A MEAL.3 60 tablet 5  . traMADol (ULTRAM) 50 MG tablet Take 100 mg by mouth 3 (three) times daily.      No current facility-administered medications for this visit.    Past Medical History  Diagnosis Date  . Hyperlipidemia   . Anxiety   . Carotid stenosis     Less than 50% 8/11  . CAD (coronary artery disease)     PCI Stent RCA 03/12/11.  Cath 03/22/11 LVEF 55-60%.  RCA patent  proximal/distal stents with 30% proximal disease and 20% mid stenosis.  LCX with 30%  mid stenosis.  Questionable spasm.  Marland Kitchen HTN (hypertension)   . H/O hiatal hernia   . GERD (gastroesophageal reflux disease) 03/02/2014      Past Surgical History  Procedure Laterality Date  . Ectopic pregnancy surgery    . Tonsillectomy and adenoidectomy    . Cholecystectomy    . Left wrist    . Heart stent      2012  x 2 stents  . Cyst removed from spine    . Left knee      Arthroscopic  . Tubal ligation    . Total knee arthroplasty Left 09/26/2013    Procedure: TOTAL KNEE ARTHROPLASTY- LEFT;  Surgeon: [name]., MD;  Location: [facility];  Service: Orthopedics;  Laterality: Left;  . Colonoscopy N/A 04/01/2014    Procedure: COLONOSCOPY;  Surgeon: Rogene Houston, MD;  [facility];  Service: Endoscopy;  Laterality: N/A;  100  . Esophagogastroduodenoscopy N/A 04/01/2014    Procedure: ESOPHAGOGASTRODUODENOSCOPY (EGD);  Surgeon: Rogene Houston, MD;  [facility];  Service: Endoscopy;  Laterality: N/A;  100    Social History   Social History  . Marital Status: Married    Spouse Name: N/A  . Number of Children: N/A  . Years of Education: N/A   Occupational History  . Not on file.   Social History Main Topics  . Smoking status: Former Smoker -- 1.00 packs/day for 10 years    Types: Cigarettes    Start date: 06/27/1979    Quit date: 11/27/2010  . Smokeless tobacco: Never Used  . Alcohol Use: 0.0 oz/week    0 Standard drinks or equivalent per week     Comment: social  . Drug Use: No  . Sexual Activity: Not on file   Other Topics Concern  . Not on file   Social History Narrative     Filed Vitals:   11/17/15 1121  BP: 130/100  Pulse: 94  Height: 5\' 2"  (1.575 m)  Weight: 167 lb (75.751 kg)  SpO2: 97%    PHYSICAL EXAM General: NAD HEENT: Normal. Neck: No JVD, no thyromegaly. Lungs: Clear to auscultation bilaterally with normal respiratory effort. CV: Nondisplaced PMI.  Regular rate and rhythm, normal S1/S2, no S3/S4, no murmur. No pretibial or periankle edema.   Abdomen: Soft, nontender, no distention.  Neurologic: Alert and oriented x 3.  Psych: Normal affect. Skin:  Normal. Musculoskeletal: No gross deformities. Extremities: No clubbing or cyanosis.   ECG: Most recent ECG reviewed.      ASSESSMENT AND PLAN: 1. CAD: Symptomatically stable. Continue with aggressive risk reduction and current medical therapy with ASA and Imdur.  2. Essential hypertension: DBP elevated today but was 110/70 at PCP's office last week, and she was very stressed and rushed to get here. No changes. Will monitor.  3. Mixed dyslipidemia: She has been  intolerant of all statins with myalgias including Crestor 5 mg. Lipids on 09/15/15 showed total cholesterol 262, triglycerides 267, HDL 46, LDL 163. I have already made arrangements for her to be started on PCSK-9 inhibitors (Repatha) but will need to clarify candidacy and insurance coverage.  4. Carotid stenosis:  Mild by Dopplers in 04/2015 as noted above. Repeat in 2 years.  Dispo: f/u 6 months.  Kate Sable, M.D., F.A.C.C.

## 2015-11-30 ENCOUNTER — Other Ambulatory Visit: Payer: Self-pay | Admitting: *Deleted

## 2015-11-30 ENCOUNTER — Telehealth: Payer: Self-pay | Admitting: *Deleted

## 2015-11-30 MED ORDER — NITROGLYCERIN 0.4 MG SL SUBL: 0.4000 mg | SUBLINGUAL_TABLET | SUBLINGUAL | Status: DC | PRN

## 2015-11-30 NOTE — Telephone Encounter (Signed)
Left message to return call on home number.  Fast busy signal on cell number.

## 2015-11-30 NOTE — Telephone Encounter (Signed)
Mrs. Bonica returned call to [facility]

## 2015-12-01 MED ORDER — PITAVASTATIN CALCIUM 2 MG PO TABS
1.0000 | ORAL_TABLET | Freq: Every day | ORAL | Status: DC
Start: 2015-12-01 — End: 2015-12-27

## 2015-12-01 NOTE — Telephone Encounter (Signed)
Patient will try Livalo 2mg  daily prior to trying the Riva per Dr. Bronson Ing.    Samples will be provided first to see if she tolerates this medication.    Patient notified & advised to take daily (anytime of the day) & may be taken with or without food.    Patient verbalized understanding.

## 2015-12-11 ENCOUNTER — Other Ambulatory Visit (INDEPENDENT_AMBULATORY_CARE_PROVIDER_SITE_OTHER): Payer: Self-pay | Admitting: Internal Medicine

## 2015-12-27 ENCOUNTER — Telehealth: Payer: Self-pay | Admitting: Cardiovascular Disease

## 2015-12-27 MED ORDER — PITAVASTATIN CALCIUM 2 MG PO TABS
1.0000 | ORAL_TABLET | Freq: Every day | ORAL | Status: DC
Start: 2015-12-27 — End: 2016-04-10

## 2015-12-27 NOTE — Telephone Encounter (Signed)
Livalo - new prescription sent to [facility] at patient request.

## 2016-01-13 ENCOUNTER — Telehealth: Payer: Self-pay | Admitting: *Deleted

## 2016-01-13 NOTE — Telephone Encounter (Signed)
[employer] - insurance approved Livalo 12/28/2015 thru 11/26/2038.  Patient notified 12/29/2015.

## 2016-02-24 ENCOUNTER — Encounter (INDEPENDENT_AMBULATORY_CARE_PROVIDER_SITE_OTHER): Payer: Self-pay | Admitting: Internal Medicine

## 2016-04-08 ENCOUNTER — Other Ambulatory Visit: Payer: Self-pay | Admitting: Cardiovascular Disease

## 2016-04-10 ENCOUNTER — Emergency Department (HOSPITAL_COMMUNITY)
Admission: EM | Admit: 2016-04-10 | Discharge: 2016-04-10 | Disposition: A | Discharge status: Home / Self Care | Payer: BLUE CROSS/BLUE SHIELD | Attending: Emergency Medicine | Admitting: Emergency Medicine

## 2016-04-10 ENCOUNTER — Emergency Department (HOSPITAL_COMMUNITY): Payer: BLUE CROSS/BLUE SHIELD

## 2016-04-10 ENCOUNTER — Other Ambulatory Visit: Payer: Self-pay | Admitting: Cardiovascular Disease

## 2016-04-10 ENCOUNTER — Encounter (HOSPITAL_COMMUNITY): Payer: Self-pay | Admitting: Emergency Medicine

## 2016-04-10 DIAGNOSIS — K5730 Diverticulosis of large intestine without perforation or abscess without bleeding: Secondary | ICD-10-CM | POA: Diagnosis not present

## 2016-04-10 DIAGNOSIS — M545 Low back pain: Secondary | ICD-10-CM | POA: Diagnosis not present

## 2016-04-10 DIAGNOSIS — I10 Essential (primary) hypertension: Secondary | ICD-10-CM | POA: Insufficient documentation

## 2016-04-10 DIAGNOSIS — Z79899 Other long term (current) drug therapy: Secondary | ICD-10-CM | POA: Diagnosis not present

## 2016-04-10 DIAGNOSIS — R109 Unspecified abdominal pain: Secondary | ICD-10-CM | POA: Diagnosis not present

## 2016-04-10 DIAGNOSIS — I2510 Atherosclerotic heart disease of native coronary artery without angina pectoris: Secondary | ICD-10-CM | POA: Diagnosis not present

## 2016-04-10 DIAGNOSIS — R0602 Shortness of breath: Secondary | ICD-10-CM | POA: Diagnosis not present

## 2016-04-10 DIAGNOSIS — Z7982 Long term (current) use of aspirin: Secondary | ICD-10-CM | POA: Diagnosis not present

## 2016-04-10 DIAGNOSIS — E785 Hyperlipidemia, unspecified: Secondary | ICD-10-CM | POA: Insufficient documentation

## 2016-04-10 DIAGNOSIS — Z87891 Personal history of nicotine dependence: Secondary | ICD-10-CM | POA: Diagnosis not present

## 2016-04-10 DIAGNOSIS — R1084 Generalized abdominal pain: Secondary | ICD-10-CM | POA: Diagnosis not present

## 2016-04-10 DIAGNOSIS — R791 Abnormal coagulation profile: Secondary | ICD-10-CM | POA: Diagnosis not present

## 2016-04-10 DIAGNOSIS — Z6832 Body mass index (BMI) 32.0-32.9, adult: Secondary | ICD-10-CM | POA: Diagnosis not present

## 2016-04-10 DIAGNOSIS — R1011 Right upper quadrant pain: Secondary | ICD-10-CM | POA: Diagnosis not present

## 2016-04-10 LAB — CBC WITH DIFFERENTIAL/PLATELET
Basophils Absolute: 0 10*3/uL (ref 0.0–0.1)
Basophils Relative: 0 %
Eosinophils Absolute: 0.3 10*3/uL (ref 0.0–0.7)
Eosinophils Relative: 3 %
HEMATOCRIT: 45.2 % (ref 36.0–46.0)
Hemoglobin: 15.1 g/dL — ABNORMAL HIGH (ref 12.0–15.0)
Lymphocytes Relative: 25 %
Lymphs Abs: 2.8 10*3/uL (ref 0.7–4.0)
MCH: 30 pg (ref 26.0–34.0)
MCHC: 33.4 g/dL (ref 30.0–36.0)
MCV: 89.7 fL (ref 78.0–100.0)
MONO ABS: 1 10*3/uL (ref 0.1–1.0)
Monocytes Relative: 9 %
NEUTROS PCT: 63 %
Neutro Abs: 6.8 10*3/uL (ref 1.7–7.7)
Platelets: 313 10*3/uL (ref 150–400)
RBC: 5.04 MIL/uL (ref 3.87–5.11)
RDW: 13.9 % (ref 11.5–15.5)
WBC: 10.9 10*3/uL — ABNORMAL HIGH (ref 4.0–10.5)

## 2016-04-10 LAB — URINE MICROSCOPIC-ADD ON

## 2016-04-10 LAB — D-DIMER, QUANTITATIVE (NOT AT ARMC): D DIMER QUANT: 0.7 ug{FEU}/mL — AB (ref 0.00–0.50)

## 2016-04-10 LAB — COMPREHENSIVE METABOLIC PANEL
ALK PHOS: 95 U/L (ref 38–126)
ALT: 33 U/L (ref 14–54)
ANION GAP: 9 (ref 5–15)
AST: 32 U/L (ref 15–41)
Albumin: 4.5 g/dL (ref 3.5–5.0)
BILIRUBIN TOTAL: 0.5 mg/dL (ref 0.3–1.2)
BUN: 15 mg/dL (ref 6–20)
CALCIUM: 9.7 mg/dL (ref 8.9–10.3)
CO2: 30 mmol/L (ref 22–32)
Chloride: 96 mmol/L — ABNORMAL LOW (ref 101–111)
Creatinine, Ser: 0.82 mg/dL (ref 0.44–1.00)
GFR calc Af Amer: >60 mL/min (ref >60)
GLUCOSE: 99 mg/dL (ref 65–99)
POTASSIUM: 3.4 mmol/L — AB (ref 3.5–5.1)
Sodium: 135 mmol/L (ref 135–145)
TOTAL PROTEIN: 7.9 g/dL (ref 6.5–8.1)

## 2016-04-10 LAB — URINALYSIS, ROUTINE W REFLEX MICROSCOPIC
Bilirubin Urine: NEGATIVE
GLUCOSE, UA: NEGATIVE mg/dL
KETONES UR: NEGATIVE mg/dL
Nitrite: NEGATIVE
PROTEIN: NEGATIVE mg/dL
Specific Gravity, Urine: 1.015 (ref 1.005–1.030)
pH: 5.5 (ref 5.0–8.0)

## 2016-04-10 LAB — I-STAT TROPONIN, ED: Troponin i, poc: 0 ng/mL (ref 0.00–0.08)

## 2016-04-10 LAB — LIPASE, BLOOD: LIPASE: 19 U/L (ref 11–51)

## 2016-04-10 MED ORDER — HYDROMORPHONE HCL 1 MG/ML IJ SOLN
1.0000 mg | Freq: Once | INTRAMUSCULAR | Status: AC
Start: 2016-04-10 — End: 2016-04-10
  Administered 2016-04-10: 1 mg via INTRAVENOUS
  Filled 2016-04-10: qty 1

## 2016-04-10 MED ORDER — DILTIAZEM HCL ER COATED BEADS 120 MG PO CP24: 120.0000 mg | ORAL_CAPSULE | Freq: Every day | ORAL | Status: DC

## 2016-04-10 MED ORDER — IOPAMIDOL (ISOVUE-370) INJECTION 76%
100.0000 mL | Freq: Once | INTRAVENOUS | Status: AC | PRN
  Administered 2016-04-10: 100 mL via INTRAVENOUS

## 2016-04-10 MED ORDER — ONDANSETRON HCL 4 MG/2ML IJ SOLN
4.0000 mg | Freq: Once | INTRAMUSCULAR | Status: AC
  Administered 2016-04-10: 4 mg via INTRAVENOUS
  Filled 2016-04-10: qty 2

## 2016-04-10 MED ORDER — SODIUM CHLORIDE 0.9 % IV BOLUS (SEPSIS)
1000.0000 mL | Freq: Once | INTRAVENOUS | Status: AC
  Administered 2016-04-10: 1000 mL via INTRAVENOUS

## 2016-04-10 MED ORDER — LORAZEPAM 2 MG/ML IJ SOLN
0.5000 mg | Freq: Once | INTRAMUSCULAR | Status: AC
  Administered 2016-04-10: 0.5 mg via INTRAVENOUS
  Filled 2016-04-10: qty 1

## 2016-04-10 MED ORDER — IOPAMIDOL (ISOVUE-300) INJECTION 61%
100.0000 mL | Freq: Once | INTRAVENOUS | Status: AC | PRN
  Administered 2016-04-10: 100 mL via INTRAVENOUS

## 2016-04-10 MED ORDER — HYDROCODONE-ACETAMINOPHEN 5-325 MG PO TABS: 1.0000 | ORAL_TABLET | Freq: Four times a day (QID) | ORAL | Status: DC | PRN

## 2016-04-10 NOTE — Discharge Instructions (Signed)
Follow up with your stomach md this week.

## 2016-04-10 NOTE — Telephone Encounter (Signed)
Done

## 2016-04-10 NOTE — ED Notes (Signed)
Pt alert & oriented x4, stable gait. Patient given discharge instructions, paperwork & prescription(s). Patient informed not to drive, operate any equipment & handel any important documents 4 hours after taking pain medication. Patient  instructed to stop at the registration desk to finish any additional paperwork. Patient  verbalized understanding. Pt left department w/ no further questions. 

## 2016-04-10 NOTE — ED Provider Notes (Signed)
CSN: [id number]     Arrival date & time 04/10/16  1458 History   First MD Initiated Contact with Patient 04/10/16 1533     Chief Complaint  Patient presents with  . Abdominal Pain     (Consider location/radiation/quality/duration/timing/severity/associated sxs/prior Treatment) Patient is a 60 y.o. female presenting with abdominal pain. The history is provided by the patient (Patient complains of right upper quadrant abdominal pain worse with inspiration).  Abdominal Pain Pain location:  Generalized Pain quality: aching   Pain radiates to:  Does not radiate Pain severity:  Moderate Onset quality:  Sudden Timing:  Constant Progression:  Unchanged Chronicity:  New Context: not alcohol use   Relieved by:  Nothing Associated symptoms: no chest pain, no cough, no diarrhea, no fatigue and no hematuria     Past Medical History  Diagnosis Date  . Hyperlipidemia   . Anxiety   . Carotid stenosis     Less than 50% 8/11  . CAD (coronary artery disease)     PCI Stent RCA 03/12/11.  Cath 03/22/11 LVEF 55-60%.  RCA patent  proximal/distal stents with 30% proximal disease and 20% mid stenosis.  LCX with 30%  mid stenosis.  Questionable spasm.  Marland Kitchen HTN (hypertension)   . H/O hiatal hernia   . GERD (gastroesophageal reflux disease) 03/02/2014   Past Surgical History  Procedure Laterality Date  . Ectopic pregnancy surgery    . Tonsillectomy and adenoidectomy    . Cholecystectomy    . Left wrist    . Heart stent      2012  x 2 stents  . Cyst removed from spine    . Left knee      Arthroscopic  . Tubal ligation    . Total knee arthroplasty Left 09/26/2013    Procedure: TOTAL KNEE ARTHROPLASTY- LEFT;  Surgeon: [name]., MD;  Location: [place];  Service: Orthopedics;  Laterality: Left;  . Colonoscopy N/A 04/01/2014    Procedure: COLONOSCOPY;  Surgeon: Rogene Houston, MD;  [facility];  Service: Endoscopy;  Laterality: N/A;  100  . Esophagogastroduodenoscopy N/A 04/01/2014     Procedure: ESOPHAGOGASTRODUODENOSCOPY (EGD);  Surgeon: Rogene Houston, MD;  [facility];  Service: Endoscopy;  Laterality: N/A;  100   Family History  Problem Relation Age of Onset  . Heart disease    . Arthritis    . Cancer    . Colon cancer Father    Social History  Substance Use Topics  . Smoking status: Former Smoker -- 1.00 packs/day for 10 years    Types: Cigarettes    Start date: 06/27/1979    Quit date: 11/27/2010  . Smokeless tobacco: Never Used  . Alcohol Use: 0.0 oz/week    0 Standard drinks or equivalent per week     Comment: social   OB History    No data available     Review of Systems  Constitutional: Negative for appetite change and fatigue.  HENT: Negative for congestion, ear discharge and sinus pressure.   Eyes: Negative for discharge.  Respiratory: Negative for cough.   Cardiovascular: Negative for chest pain.  Gastrointestinal: Positive for abdominal pain. Negative for diarrhea.  Genitourinary: Negative for frequency and hematuria.  Musculoskeletal: Negative for back pain.  Skin: Negative for rash.  Neurological: Negative for seizures and headaches.  Psychiatric/Behavioral: Negative for hallucinations.      Allergies  Ancef; Ciprofloxacin hcl; Metaxalone; and Sulfa antibiotics  Home Medications   Prior to Admission  medications   Medication Sig Start Date End Date Taking? Authorizing Provider  acetaminophen (TYLENOL) 500 MG tablet Take 500 mg by mouth every 6 (six) hours as needed for mild pain or moderate pain.   Yes Historical Provider, MD  Ascorbic Acid (VITAMIN C) 1000 MG tablet Take 1,000 mg by mouth daily.   Yes Historical Provider, MD  aspirin 81 MG tablet Take 81 mg by mouth daily.   Yes Historical Provider, MD  diltiazem (CARDIZEM CD) 120 MG 24 hr capsule Take 1 capsule (120 mg total) by mouth daily. 04/10/16  Yes Herminio Commons, MD  DULoxetine (CYMBALTA) 60 MG capsule Take 60 mg by mouth every morning. 04/06/15  Yes  Historical Provider, MD  EPINEPHrine 0.3 mg/0.3 mL IJ SOAJ injection Self inject per package instructions as needed for severe allergic reaction and seek medical attention. 03/13/15  Yes John Molpus, MD  fexofenadine (ALLEGRA) 180 MG tablet Take 180 mg by mouth daily.   Yes Historical Provider, MD  furosemide (LASIX) 40 MG tablet Take 1 tablet by mouth daily. 04/06/15  Yes Historical Provider, MD  isosorbide mononitrate (IMDUR) 30 MG 24 hr tablet Take 1 tablet (30 mg total) by mouth daily. 11/17/15  Yes Herminio Commons, MD  Multiple Vitamin (MULTIVITAMIN) tablet Take 1 tablet by mouth daily.   Yes Historical Provider, MD  Naphazoline-Pheniramine 0.027-0.315 % SOLN Apply 1 drop to eye 3 (three) times daily as needed (FOR ITCHY EYE/IRRITATION).    Yes Historical Provider, MD  nitroGLYCERIN (NITROSTAT) 0.4 MG SL tablet Place 1 tablet (0.4 mg total) under the tongue every 5 (five) minutes as needed for chest pain. 11/30/15  Yes Herminio Commons, MD  Probiotic Product (PROBIOTIC DAILY PO) Take 1 tablet by mouth daily.   Yes Historical Provider, MD  PROCTOSOL HC 2.5 % rectal cream APPLY RECTALLY TWICE A DAY AS DIRECTED 12/13/15  Yes Butch Penny, NP  traMADol (ULTRAM) 50 MG tablet Take 100 mg by mouth 3 (three) times daily.    Yes Historical Provider, MD  zolpidem (AMBIEN) 10 MG tablet Take 10 mg by mouth at bedtime as needed. 03/23/16  Yes Historical Provider, MD  HYDROcodone-acetaminophen (NORCO/VICODIN) 5-325 MG tablet Take 1 tablet by mouth every 6 (six) hours as needed for moderate pain. 04/10/16   Milton Ferguson, MD   BP 109/72 mmHg  Pulse 88  Temp(Src) 98 F (36.7 C) (Oral)  Resp 18  SpO2 98% Physical Exam  Constitutional: She is oriented to person, place, and time. She appears well-developed.  HENT:  Head: Normocephalic.  Eyes: Conjunctivae and EOM are normal. No scleral icterus.  Neck: Neck supple. No thyromegaly present.  Cardiovascular: Normal rate and regular rhythm.  Exam reveals no  gallop and no friction rub.   No murmur heard. Pulmonary/Chest: No stridor. She has no wheezes. She has no rales. She exhibits no tenderness.  Abdominal: She exhibits no distension. There is tenderness. There is no rebound.  Tender right upper quadrant  Musculoskeletal: Normal range of motion. She exhibits no edema.  Lymphadenopathy:    She has no cervical adenopathy.  Neurological: She is oriented to person, place, and time. She exhibits normal muscle tone. Coordination normal.  Skin: No rash noted. No erythema.  Psychiatric: She has a normal mood and affect. Her behavior is normal.    ED Course  Procedures (including critical care time) Labs Review Labs Reviewed  CBC WITH DIFFERENTIAL/PLATELET - Abnormal; Notable for the following:    WBC 10.9 (*)  Hemoglobin 15.1 (*)    All other components within normal limits  COMPREHENSIVE METABOLIC PANEL - Abnormal; Notable for the following:    Potassium 3.4 (*)    Chloride 96 (*)    All other components within normal limits  URINALYSIS, ROUTINE W REFLEX MICROSCOPIC (NOT AT [facility]) - Abnormal; Notable for the following:    Hgb urine dipstick TRACE (*)    Leukocytes, UA TRACE (*)    All other components within normal limits  URINE MICROSCOPIC-ADD ON - Abnormal; Notable for the following:    Squamous Epithelial / LPF 6-30 (*)    Bacteria, UA RARE (*)    All other components within normal limits  D-DIMER, QUANTITATIVE (NOT AT [facility]) - Abnormal; Notable for the following:    D-Dimer, Quant 0.70 (*)    All other components within normal limits  LIPASE, BLOOD  I-STAT TROPOININ, ED    Imaging Review Ct Angio Chest Pe W/cm &/or Wo Cm  04/10/2016  CLINICAL DATA:  Elevated D-dimer. RIGHT upper quadrant pain for 2 days. EXAM: CT ANGIOGRAPHY CHEST WITH CONTRAST TECHNIQUE: Multidetector CT imaging of the chest was performed using the standard protocol during bolus administration of intravenous contrast. Multiplanar CT image reconstructions and  MIPs were obtained to evaluate the vascular anatomy. CONTRAST:  100 mL Omnipaque COMPARISON:  None. FINDINGS: Mediastinum/Nodes: No axillary supraclavicular adenopathy. No mediastinal hilar adenopathy. NO filling defects the pulmonary arteries suggest acute pulmonary embolism. Aorta great vessels are normal. No pericardial fluid. Gas-filled esophagus. Lungs/Pleura: Mild ground-glass opacities suggest atelectasis. No infiltrate infarct pneumothorax or pleural fluid Upper abdomen: Limited view of the liver, kidneys, pancreas are unremarkable. Normal adrenal glands. Degenerative osteophytosis of the thoracic spine. Musculoskeletal: Degenerative osteophytosis of the thoracic spine. Review of the MIP images confirms the above findings. IMPRESSION: 1. No acute pulmonary embolism. 2. Mild atelectasis. Electronically Signed   By: Suzy Bouchard M.D.   On: 04/10/2016 20:20   Ct Abdomen Pelvis W Contrast  04/10/2016  CLINICAL DATA:  Hypertension. History of renal calculi. Right flank and back pain. EXAM: CT ABDOMEN AND PELVIS WITH CONTRAST TECHNIQUE: Multidetector CT imaging of the abdomen and pelvis was performed using the standard protocol following bolus administration of intravenous contrast. CONTRAST:  125mL ISOVUE-300 IOPAMIDOL (ISOVUE-300) INJECTION 61% COMPARISON:  Report from 02/08/2014 FINDINGS: Lower chest: Small type 1 hiatal hernia. Right coronary artery atherosclerotic calcification. Hepatobiliary: Cholecystectomy. Pancreas: Unremarkable Spleen: Unremarkable Adrenals/Urinary Tract: Adrenal glands normal. Several benign-appearing cysts of the right kidney, the more superior of which may have a thin septation. A hypodense 4 mm lesion of the left mid kidney is technically too small to characterize on image 20/7, but statistically likely to be a cyst. No urinary tract calculi identified.  Urinary bladder unremarkable. Stomach/Bowel: Sigmoid diverticulosis also with scattered diverticula of the descending colon.  No active diverticulitis. Appendix normal. Vascular/Lymphatic: Aortoiliac atherosclerotic vascular disease. No pathologic adenopathy identified. Reproductive: Unremarkable Other: No supplemental non-categorized findings. Musculoskeletal: Unremarkable IMPRESSION: 1. No specific abnormalities to explain the patient' s right flank and back pain. No urinary tract calculi or appendicitis. 2.  Aortoiliac atherosclerotic vascular disease. 3. Descending and sigmoid colon diverticulosis. No active diverticulitis. 4. Small type 1 hiatal hernia. 5. Right coronary artery atherosclerotic calcification. Electronically Signed   By: Van Clines M.D.   On: 04/10/2016 19:17   Dg Chest Portable 1 View  04/10/2016  CLINICAL DATA:  Right-sided chest pain and shortness of breath. Former smoker. EXAM: PORTABLE CHEST 1 VIEW COMPARISON:  09/18/2013 FINDINGS: The cardiomediastinal silhouette  is within normal limits. The lungs are mildly hypoinflated. No airspace consolidation, edema, pleural effusion, or pneumothorax is identified. No acute osseous abnormality is seen. IMPRESSION: No active disease. Electronically Signed   By: Logan Bores M.D.   On: 04/10/2016 16:15   I have personally reviewed and evaluated these images and lab results as part of my medical decision-making.   EKG Interpretation   Date/Time:  Monday Apr 10 2016 15:29:38 EDT Ventricular Rate:  93 PR Interval:  170 QRS Duration: 96 QT Interval:  348 QTC Calculation: 432 R Axis:   44 Text Interpretation:  Normal sinus rhythm Normal ECG Confirmed by Cierra Rothgeb   MD, Kayshaun Polanco ([id number]) on 04/10/2016 7:26:26 PM      MDM   Final diagnoses:  Abdominal pain in female    Labs unremarkable pain improved with pain medicine and fluids. CT scan abdomen and chest unremarkable. Possible worsening of her GERD symptoms. Patient will be given some pain medicine she will continue taking her protein pump inhibitor twice a day and follow-up with her GI  doctor    Milton Ferguson, MD 04/10/16 2040

## 2016-04-10 NOTE — Telephone Encounter (Signed)
Refill: diltiazem (CARDIZEM CD) 120 MG 24 hr capsule  [facility]

## 2016-04-10 NOTE — ED Notes (Signed)
Pt c/o ruq pain radiating to back x 2 days. Pt c/o reports vomiting.

## 2016-04-18 ENCOUNTER — Encounter (INDEPENDENT_AMBULATORY_CARE_PROVIDER_SITE_OTHER): Payer: Self-pay | Admitting: Internal Medicine

## 2016-04-18 ENCOUNTER — Ambulatory Visit (INDEPENDENT_AMBULATORY_CARE_PROVIDER_SITE_OTHER): Payer: BLUE CROSS/BLUE SHIELD | Admitting: Internal Medicine

## 2016-04-18 VITALS — BP 112/70 | HR 64 | Temp 97.4°F | Ht 62.0 in | Wt 170.5 lb

## 2016-04-18 DIAGNOSIS — T148 Other injury of unspecified body region: Secondary | ICD-10-CM

## 2016-04-18 DIAGNOSIS — T148XXA Other injury of unspecified body region, initial encounter: Secondary | ICD-10-CM

## 2016-04-18 DIAGNOSIS — K6289 Other specified diseases of anus and rectum: Secondary | ICD-10-CM | POA: Diagnosis not present

## 2016-04-18 MED ORDER — HYDROCORTISONE 2.5 % RE CREA: TOPICAL_CREAM | RECTAL | Status: DC

## 2016-04-18 NOTE — Patient Instructions (Addendum)
Tramadol as needed. OV in 1 year.

## 2016-04-18 NOTE — Progress Notes (Signed)
Subjective:    Patient ID: [name], female    DOB: 1956/02/25, 60 y.o.   MRN: [id number]  HPI Here today for f/u after recent visit to the ED for ruq pain and back pain. The pain started 9 days. She says the pain is still there but has eased some.  She says she had food poison that Saturday night and she vomited violently.  She had eaten chicken and peppers at the [employer]. She became sick after.  She says she feels like every muscle in. her body felt like it was twisted after the vomiting. She tells me when she gently touches her abdomen, she has pain. She denies any trauma Her appetite is good. She has gained weight She usually has a BM x 3 a day and are formed. She has had up to 7 stools a day and are formed. If she becomes excited or anxious she will have several stools.    Hx significant for heart disease and has 2 stents.      Procedure Date: 04/01/2014  Procedure: EGD & Colonoscopy  Indications: Patient is 60 year old Caucasian female who has chronic GERD and intermittent double dose PPI who presents with daily epigastric pain described as burning. She has history of CAD and recent cardiac evaluation has been negative. She is undergoing diagnostic EGD followed by high-dose colonoscopy. Father had surgery for colon carcinoma at age 38. Patient was hospitalized 2 months ago at [facility] for left-sided colitis for which she is fully recovered.  Impression:  EGD findings; Mild changes of reflux esophagitis limited to GE junction. Small sliding hiatal hernia. No evidence of peptic ulcer disease or gastritis.  Colonoscopy findings; Small cecal polyp ablated via cold biopsy. No evidence of residual colitis(left-sided colitis 8 weeks). Sigmoid colon diverticulosis with single diverticulum at hepatic flexure. Small external hemorrhoids.  Comment; No abnormality  noted to account for patient's persistent epigastric pain.   04/10/2016 Chest xray: negative.  04/10/2016 CT abdomen/pelvis with CM: RT flank pain. IMPRESSION: 1. No specific abnormalities to explain the patient' s right flank and back pain. No urinary tract calculi or appendicitis. 2. Aortoiliac atherosclerotic vascular disease. 3. Descending and sigmoid colon diverticulosis. No active diverticulitis. 4. Small type 1 hiatal hernia. 5. Right coronary artery atherosclerotic calcification.   CBC    Component Value Date/Time   WBC 10.9* 04/10/2016 1545   RBC 5.04 04/10/2016 1545   HGB 15.1* 04/10/2016 1545   HCT 45.2 04/10/2016 1545   PLT 313 04/10/2016 1545   MCV 89.7 04/10/2016 1545   MCH 30.0 04/10/2016 1545   MCHC 33.4 04/10/2016 1545   RDW 13.9 04/10/2016 1545   LYMPHSABS 2.8 04/10/2016 1545   MONOABS 1.0 04/10/2016 1545   EOSABS 0.3 04/10/2016 1545   BASOSABS 0.0 04/10/2016 1545    Hepatic Function Panel     Component Value Date/Time   PROT 7.9 04/10/2016 1545   ALBUMIN 4.5 04/10/2016 1545   AST 32 04/10/2016 1545   ALT 33 04/10/2016 1545   ALKPHOS 95 04/10/2016 1545   BILITOT 0.5 04/10/2016 1545        Review of Systems Past Medical History  Diagnosis Date  . Hyperlipidemia   . Anxiety   . Carotid stenosis     Less than 50% 8/11  . CAD (coronary artery disease)     PCI Stent RCA 03/12/11.  Cath 03/22/11 LVEF 55-60%.  RCA patent  proximal/distal stents with 30% proximal disease and 20% mid stenosis.  LCX with 30%  mid stenosis.  Questionable spasm.  Marland Kitchen HTN (hypertension)   . H/O hiatal hernia   . GERD (gastroesophageal reflux disease) 03/02/2014    Past Surgical History  Procedure Laterality Date  . Ectopic pregnancy surgery    . Tonsillectomy and adenoidectomy    . Cholecystectomy    . Left wrist    . Heart stent      2012  x 2 stents  . Cyst removed from spine    . Left knee      Arthroscopic  . Tubal ligation    . Total knee arthroplasty Left  09/26/2013    Procedure: TOTAL KNEE ARTHROPLASTY- LEFT;  Surgeon: [name]., MD;  Location: [place];  Service: Orthopedics;  Laterality: Left;  . Colonoscopy N/A 04/01/2014    Procedure: COLONOSCOPY;  Surgeon: Rogene Houston, MD;  [facility];  Service: Endoscopy;  Laterality: N/A;  100  . Esophagogastroduodenoscopy N/A 04/01/2014    Procedure: ESOPHAGOGASTRODUODENOSCOPY (EGD);  Surgeon: Rogene Houston, MD;  [facility];  Service: Endoscopy;  Laterality: N/A;  100    Allergies  Allergen Reactions  . Ancef [Cefazolin] Itching    Itching around mouth - sx resolved quickly after administration of benadryl; with no further sx  . Ciprofloxacin Hcl   . Metaxalone   . Sulfa Antibiotics Itching, Rash and Other (See Comments)    "severe muscle cramps, tongue cracked, dry mouth "    Current Outpatient Prescriptions on File Prior to Visit  Medication Sig Dispense Refill  . acetaminophen (TYLENOL) 500 MG tablet Take 500 mg by mouth every 6 (six) hours as needed for mild pain or moderate pain.    . Ascorbic Acid (VITAMIN C) 1000 MG tablet Take 1,000 mg by mouth daily.    Marland Kitchen aspirin 81 MG tablet Take 81 mg by mouth daily.    Marland Kitchen diltiazem (CARDIZEM CD) 120 MG 24 hr capsule Take 1 capsule (120 mg total) by mouth daily. 90 capsule 3  . DULoxetine (CYMBALTA) 60 MG capsule Take 60 mg by mouth every morning.  3  . EPINEPHrine 0.3 mg/0.3 mL IJ SOAJ injection Self inject per package instructions as needed for severe allergic reaction and seek medical attention. 2 Device 0  . fexofenadine (ALLEGRA) 180 MG tablet Take 180 mg by mouth daily.    . furosemide (LASIX) 40 MG tablet Take 1 tablet by mouth daily.  12  . isosorbide mononitrate (IMDUR) 30 MG 24 hr tablet Take 1 tablet (30 mg total) by mouth daily. 90 tablet 3  . Multiple Vitamin (MULTIVITAMIN) tablet Take 1 tablet by mouth daily.    . Naphazoline-Pheniramine 0.027-0.315 % SOLN Apply 1 drop to eye 3 (three) times daily as needed  (FOR ITCHY EYE/IRRITATION).     Marland Kitchen nitroGLYCERIN (NITROSTAT) 0.4 MG SL tablet Place 1 tablet (0.4 mg total) under the tongue every 5 (five) minutes as needed for chest pain. 25 tablet 3  . Probiotic Product (PROBIOTIC DAILY PO) Take 1 tablet by mouth daily.    . traMADol (ULTRAM) 50 MG tablet Take 100 mg by mouth 3 (three) times daily.     Marland Kitchen zolpidem (AMBIEN) 10 MG tablet Take 10 mg by mouth at bedtime as needed.  3  . HYDROcodone-acetaminophen (NORCO/VICODIN) 5-325 MG tablet Take 1 tablet by mouth every 6 (six) hours as needed for moderate pain. (Patient not taking: Reported on 04/18/2016) 20 tablet 0   No current facility-administered medications on file prior to visit.  Objective:   Physical Exam Blood pressure 112/70, pulse 64, temperature 97.4 F (36.3 C), height 5\' 2"  (1.575 m), weight 170 lb 8 oz (77.338 kg). Alert and oriented. Skin warm and dry. Oral mucosa is moist.   . Sclera anicteric, conjunctivae is pink. Thyroid not enlarged. No cervical lymphadenopathy. Lungs clear. Heart regular rate and rhythm.  Abdomen is soft. Bowel sounds are positive. No hepatomegaly. No abdominal masses felt.  .  No edema to lower extremities.   Tenderness rt lower ribs and posterior ribs on gentle touching.        Assessment & Plan:  MS pain from vomiting. She says it feels better. Still has some tenderness. She will continue the Tramadol.  Rectal pain: Rx for Proctofoam sent to her pharmacy.

## 2016-04-20 ENCOUNTER — Ambulatory Visit (INDEPENDENT_AMBULATORY_CARE_PROVIDER_SITE_OTHER): Payer: BLUE CROSS/BLUE SHIELD | Admitting: Internal Medicine

## 2016-04-26 ENCOUNTER — Other Ambulatory Visit: Payer: Self-pay | Admitting: *Deleted

## 2016-04-26 MED ORDER — ISOSORBIDE MONONITRATE ER 30 MG PO TB24: 30.0000 mg | ORAL_TABLET | Freq: Every day | ORAL | Status: DC

## 2016-05-09 DIAGNOSIS — L259 Unspecified contact dermatitis, unspecified cause: Secondary | ICD-10-CM | POA: Diagnosis not present

## 2016-05-09 DIAGNOSIS — K130 Diseases of lips: Secondary | ICD-10-CM | POA: Diagnosis not present

## 2016-05-13 ENCOUNTER — Other Ambulatory Visit: Payer: Self-pay | Admitting: Cardiovascular Disease

## 2016-05-15 ENCOUNTER — Telehealth (INDEPENDENT_AMBULATORY_CARE_PROVIDER_SITE_OTHER): Payer: Self-pay | Admitting: Internal Medicine

## 2016-05-15 ENCOUNTER — Other Ambulatory Visit: Payer: Self-pay | Admitting: Cardiovascular Disease

## 2016-05-15 MED ORDER — DILTIAZEM HCL ER COATED BEADS 120 MG PO CP24
120.0000 mg | ORAL_CAPSULE | Freq: Every day | ORAL | Status: DC
Start: 2016-05-15 — End: 2016-12-20

## 2016-05-15 NOTE — Telephone Encounter (Signed)
Patient called, stated she has an appointment Wednesday for a 1 year follow up.  She was just here recently, she'd like to know if she still needs to come Wednesday or not.  [phone]

## 2016-05-15 NOTE — Telephone Encounter (Signed)
Refill:    diltiazem (CARDIZEM CD) 120 MG 24 hr capsule [id number]     [facility]  [place], [place]

## 2016-05-16 NOTE — Telephone Encounter (Signed)
Does not need to come

## 2016-05-17 ENCOUNTER — Ambulatory Visit (INDEPENDENT_AMBULATORY_CARE_PROVIDER_SITE_OTHER): Payer: BLUE CROSS/BLUE SHIELD | Admitting: Internal Medicine

## 2016-05-21 ENCOUNTER — Other Ambulatory Visit: Payer: Self-pay | Admitting: Cardiovascular Disease

## 2016-05-21 ENCOUNTER — Other Ambulatory Visit (INDEPENDENT_AMBULATORY_CARE_PROVIDER_SITE_OTHER): Payer: Self-pay | Admitting: Internal Medicine

## 2016-05-23 ENCOUNTER — Ambulatory Visit: Payer: BLUE CROSS/BLUE SHIELD | Admitting: Cardiovascular Disease

## 2016-05-24 ENCOUNTER — Other Ambulatory Visit: Payer: Self-pay | Admitting: Cardiovascular Disease

## 2016-05-24 NOTE — Telephone Encounter (Signed)
Spoke with [facility] [place] confirmed [facility] ready for pickup. Pt made aware.

## 2016-05-24 NOTE — Telephone Encounter (Signed)
Pt called stating her pharmacy can't fill her isosorbide mononitrate (IMDUR) 30 MG 24 hr tablet [id number] b/c it's needing a prior auth

## 2016-05-31 DIAGNOSIS — M545 Low back pain: Secondary | ICD-10-CM | POA: Diagnosis not present

## 2016-05-31 DIAGNOSIS — R51 Headache: Secondary | ICD-10-CM | POA: Diagnosis not present

## 2016-05-31 DIAGNOSIS — J029 Acute pharyngitis, unspecified: Secondary | ICD-10-CM | POA: Diagnosis not present

## 2016-05-31 DIAGNOSIS — Z6833 Body mass index (BMI) 33.0-33.9, adult: Secondary | ICD-10-CM | POA: Diagnosis not present

## 2016-06-13 DIAGNOSIS — R5382 Chronic fatigue, unspecified: Secondary | ICD-10-CM | POA: Diagnosis not present

## 2016-06-13 DIAGNOSIS — K219 Gastro-esophageal reflux disease without esophagitis: Secondary | ICD-10-CM | POA: Diagnosis not present

## 2016-06-13 DIAGNOSIS — I10 Essential (primary) hypertension: Secondary | ICD-10-CM | POA: Diagnosis not present

## 2016-06-13 DIAGNOSIS — E7800 Pure hypercholesterolemia, unspecified: Secondary | ICD-10-CM | POA: Diagnosis not present

## 2016-06-15 DIAGNOSIS — Z85828 Personal history of other malignant neoplasm of skin: Secondary | ICD-10-CM | POA: Diagnosis not present

## 2016-06-15 DIAGNOSIS — L570 Actinic keratosis: Secondary | ICD-10-CM | POA: Diagnosis not present

## 2016-06-15 DIAGNOSIS — D485 Neoplasm of uncertain behavior of skin: Secondary | ICD-10-CM | POA: Diagnosis not present

## 2016-06-15 DIAGNOSIS — L259 Unspecified contact dermatitis, unspecified cause: Secondary | ICD-10-CM | POA: Diagnosis not present

## 2016-06-21 DIAGNOSIS — Z6831 Body mass index (BMI) 31.0-31.9, adult: Secondary | ICD-10-CM | POA: Diagnosis not present

## 2016-06-21 DIAGNOSIS — Z0001 Encounter for general adult medical examination with abnormal findings: Secondary | ICD-10-CM | POA: Diagnosis not present

## 2016-06-21 DIAGNOSIS — Z1389 Encounter for screening for other disorder: Secondary | ICD-10-CM | POA: Diagnosis not present

## 2016-07-03 ENCOUNTER — Ambulatory Visit (INDEPENDENT_AMBULATORY_CARE_PROVIDER_SITE_OTHER): Payer: BLUE CROSS/BLUE SHIELD | Admitting: Cardiovascular Disease

## 2016-07-03 VITALS — BP 117/78 | HR 93 | Ht 62.0 in | Wt 174.0 lb

## 2016-07-03 DIAGNOSIS — E782 Mixed hyperlipidemia: Secondary | ICD-10-CM

## 2016-07-03 DIAGNOSIS — I25118 Atherosclerotic heart disease of native coronary artery with other forms of angina pectoris: Secondary | ICD-10-CM | POA: Diagnosis not present

## 2016-07-03 DIAGNOSIS — Z23 Encounter for immunization: Secondary | ICD-10-CM | POA: Diagnosis not present

## 2016-07-03 DIAGNOSIS — I779 Disorder of arteries and arterioles, unspecified: Secondary | ICD-10-CM

## 2016-07-03 DIAGNOSIS — I10 Essential (primary) hypertension: Secondary | ICD-10-CM | POA: Diagnosis not present

## 2016-07-03 DIAGNOSIS — I739 Peripheral vascular disease, unspecified: Secondary | ICD-10-CM

## 2016-07-03 NOTE — Progress Notes (Signed)
SUBJECTIVE: The patient presents for routine follow up. 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 She had 2 drug-eluting stent placements at that time. She had recurrent chest pain with cardiac catheterization not long after this. She had patent stents. She was suspected of having coronary spasm and has been treated with calcium channel blockers and long-acting nitroglycerin. Her ejection fraction is known to be normal. In December of 2012 she presented to [facility] with atypical chest pain. She was admitted for observation. She ruled out for a myocardial infarction. She underwent a nuclear stress test which showed no evidence of ischemia.   Carotid Dopplers showed mild stenosis of 1-39% bilaterally in June 2016.  She was unable to tolerate Crestor 5 mg and Livalo. She denies chest pain, shortness of breath, and leg swelling.   Her primary complaint relates to right upper abdominal/inframammary pain which began after vomiting. She has gotten no relief and has gained weight. She is very anxious about this.  Lipids 06/13/16 total cholesterol 264, triglycerides 229, LDL 163, HDL 55.   Review of Systems: As per "subjective", otherwise negative.  Allergies  Allergen Reactions  . Ancef [Cefazolin] Itching    Itching around mouth - sx resolved quickly after administration of benadryl; with no further sx  . Ciprofloxacin Hcl   . Metaxalone   . Sulfa Antibiotics Itching, Rash and Other (See Comments)    "severe muscle cramps, tongue cracked, dry mouth "    Current Outpatient Prescriptions  Medication Sig Dispense Refill  . acetaminophen (TYLENOL) 500 MG tablet Take 500 mg by mouth every 6 (six) hours as needed for mild pain or moderate pain.    . Ascorbic Acid (VITAMIN C) 1000 MG tablet Take 1,000 mg by mouth daily.    Marland Kitchen aspirin 81 MG tablet Take 81 mg by mouth daily.    Marland Kitchen diltiazem (CARDIZEM CD) 120 MG 24 hr  capsule Take 1 capsule (120 mg total) by mouth daily. 90 capsule 3  . DULoxetine (CYMBALTA) 60 MG capsule Take 60 mg by mouth every morning.  3  . EPINEPHrine 0.3 mg/0.3 mL IJ SOAJ injection Self inject per package instructions as needed for severe allergic reaction and seek medical attention. 2 Device 0  . fexofenadine (ALLEGRA) 180 MG tablet Take 180 mg by mouth daily.    . furosemide (LASIX) 40 MG tablet Take 1 tablet by mouth daily.  12  . hydrocortisone (PROCTOSOL HC) 2.5 % rectal cream APPLY RECTALLY TWICE A DAY AS DIRECTED 28.35 g 2  . isosorbide mononitrate (IMDUR) 30 MG 24 hr tablet Take 1 tablet (30 mg total) by mouth daily. 90 tablet 3  . lisinopril-hydrochlorothiazide (PRINZIDE,ZESTORETIC) 10-12.5 MG tablet Take 1 tablet by mouth daily.  3  . Multiple Vitamin (MULTIVITAMIN) tablet Take 1 tablet by mouth daily.    . Naphazoline-Pheniramine 0.027-0.315 % SOLN Apply 1 drop to eye 3 (three) times daily as needed (FOR ITCHY EYE/IRRITATION).     Marland Kitchen nitroGLYCERIN (NITROSTAT) 0.4 MG SL tablet Place 1 tablet (0.4 mg total) under the tongue every 5 (five) minutes as needed for chest pain. 25 tablet 3  . Probiotic Product (PROBIOTIC DAILY PO) Take 1 tablet by mouth daily.    . RABEprazole (ACIPHEX) 20 MG tablet TAKE 1 TABLET (20 MG TOTAL) BY MOUTH 2 (TWO) TIMES DAILY BEFORE A MEAL.3 60 tablet 5  . sucralfate (CARAFATE) 1 g tablet Take 1 g by mouth 4 (  four) times daily -  with meals and at bedtime.    . traMADol (ULTRAM) 50 MG tablet Take 100 mg by mouth 3 (three) times daily.     Marland Kitchen. zolpidem (AMBIEN) 10 MG tablet Take 10 mg by mouth at bedtime as needed.  3   No current facility-administered medications for this visit.     Past Medical History:  Diagnosis Date  . Anxiety   . CAD (coronary artery disease)    PCI Stent RCA 03/12/11.  Cath 03/22/11 LVEF 55-60%.  RCA patent  proximal/distal stents with 30% proximal disease and 20% mid stenosis.  LCX with 30%  mid stenosis.  Questionable spasm.  .  Carotid stenosis    Less than 50% 8/11  . GERD (gastroesophageal reflux disease) 03/02/2014  . H/O hiatal hernia   . HTN (hypertension)   . Hyperlipidemia     Past Surgical History:  Procedure Laterality Date  . CHOLECYSTECTOMY    . COLONOSCOPY N/A 04/01/2014   Procedure: COLONOSCOPY;  Surgeon: Malissa HippoNajeeb U Rehman, MD;  Location: [facility];  Service: Endoscopy;  Laterality: N/A;  100  . Cyst removed from spine    . ECTOPIC PREGNANCY SURGERY    . ESOPHAGOGASTRODUODENOSCOPY N/A 04/01/2014   Procedure: ESOPHAGOGASTRODUODENOSCOPY (EGD);  Surgeon: Malissa HippoNajeeb U Rehman, MD;  Location: [facility];  Service: Endoscopy;  Laterality: N/A;  100  . Heart stent     2012  x 2 stents  . Left knee     Arthroscopic  . Left wrist    . TONSILLECTOMY AND ADENOIDECTOMY    . TOTAL KNEE ARTHROPLASTY Left 09/26/2013   Procedure: TOTAL KNEE ARTHROPLASTY- LEFT;  Surgeon: Thera FlakeW D Caffrey Jr., MD;  Location: [facility] OR;  Service: Orthopedics;  Laterality: Left;  . TUBAL LIGATION      Social History   Social History  . Marital status: Married    Spouse name: N/A  . Number of children: N/A  . Years of education: N/A   Occupational History  . Not on file.   Social History Main Topics  . Smoking status: Former Smoker    Packs/day: 1.00    Years: 10.00    Types: Cigarettes    Start date: 06/27/1979    Quit date: 11/27/2010  . Smokeless tobacco: Never Used  . Alcohol use 0.0 oz/week     Comment: social  . Drug use: No  . Sexual activity: Not on file   Other Topics Concern  . Not on file   Social History Narrative  . No narrative on file     Vitals:   07/03/16 1055  BP: 117/78  Pulse: 93  Weight: 174 lb (78.9 kg)  Height: 5\' 2"  (1.575 m)    PHYSICAL EXAM General: NAD HEENT: Normal. Neck: No JVD, no thyromegaly. Lungs: Clear to auscultation bilaterally with normal respiratory effort. CV: Nondisplaced PMI.  Regular rate and rhythm, normal S1/S2, no S3/S4, no murmur. No pretibial or periankle edema.   No carotid bruit.   Abdomen: Soft, nontender, no distention.  Neurologic: Alert and oriented.  Psych: Normal affect. Skin: Normal. Musculoskeletal: No gross deformities.    ECG: Most recent ECG reviewed.      ASSESSMENT AND PLAN: 1. CAD with h/o MI and stents: Symptomatically stable. Continue with aggressive risk reduction and current medical therapy with ASA and Imdur. Will initiate Repatha due to multiple statin intolerances.  2. Essential hypertension: Controlled. No changes.  3. Mixed dyslipidemia: She has been intolerant of all statins with myalgias  including Crestor 5 mg and Livalo.  Lipids 06/13/16 total cholesterol 264, triglycerides 229, LDL 163, HDL 55. Will initiate Repatha.  4. Carotid stenosis:  Mild by Dopplers in 04/2015 as noted above. Repeat in 2 years.  Dispo: f/u 1 year.   Kate Sable, M.D., F.A.C.C.

## 2016-07-03 NOTE — Patient Instructions (Signed)
Medication Instructions:   Begin Repatha 140mg  every 2 weeks.  Continue all other medications.    Labwork: None.  Testing/Procedures: None.  Follow-Up: Your physician wants you to follow up in:  1 year.  You will receive a reminder letter in the mail one-two months in advance.  If you don't receive a letter, please call our office to schedule the follow up appointment   Any Other Special Instructions Will Be Listed Below (If Applicable).  If you need a refill on your cardiac medications before your next appointment, please call your pharmacy.

## 2016-07-17 ENCOUNTER — Encounter: Payer: Self-pay | Admitting: *Deleted

## 2016-07-19 ENCOUNTER — Encounter (HOSPITAL_COMMUNITY): Payer: Self-pay | Admitting: Emergency Medicine

## 2016-07-19 ENCOUNTER — Emergency Department (HOSPITAL_COMMUNITY)
Admission: EM | Admit: 2016-07-19 | Discharge: 2016-07-19 | Disposition: A | Discharge status: Home / Self Care | Payer: BLUE CROSS/BLUE SHIELD | Attending: Emergency Medicine | Admitting: Emergency Medicine

## 2016-07-19 ENCOUNTER — Emergency Department (HOSPITAL_COMMUNITY): Payer: BLUE CROSS/BLUE SHIELD

## 2016-07-19 DIAGNOSIS — Z79899 Other long term (current) drug therapy: Secondary | ICD-10-CM | POA: Diagnosis not present

## 2016-07-19 DIAGNOSIS — R1031 Right lower quadrant pain: Secondary | ICD-10-CM | POA: Insufficient documentation

## 2016-07-19 DIAGNOSIS — R1011 Right upper quadrant pain: Secondary | ICD-10-CM | POA: Diagnosis not present

## 2016-07-19 DIAGNOSIS — R1013 Epigastric pain: Secondary | ICD-10-CM | POA: Diagnosis not present

## 2016-07-19 DIAGNOSIS — Z7982 Long term (current) use of aspirin: Secondary | ICD-10-CM | POA: Insufficient documentation

## 2016-07-19 DIAGNOSIS — K5790 Diverticulosis of intestine, part unspecified, without perforation or abscess without bleeding: Secondary | ICD-10-CM | POA: Diagnosis not present

## 2016-07-19 DIAGNOSIS — I10 Essential (primary) hypertension: Secondary | ICD-10-CM | POA: Insufficient documentation

## 2016-07-19 DIAGNOSIS — R112 Nausea with vomiting, unspecified: Secondary | ICD-10-CM | POA: Diagnosis not present

## 2016-07-19 DIAGNOSIS — Z87891 Personal history of nicotine dependence: Secondary | ICD-10-CM | POA: Insufficient documentation

## 2016-07-19 DIAGNOSIS — R300 Dysuria: Secondary | ICD-10-CM | POA: Diagnosis not present

## 2016-07-19 DIAGNOSIS — R109 Unspecified abdominal pain: Secondary | ICD-10-CM

## 2016-07-19 DIAGNOSIS — I2510 Atherosclerotic heart disease of native coronary artery without angina pectoris: Secondary | ICD-10-CM | POA: Insufficient documentation

## 2016-07-19 HISTORY — DX: Disorder of kidney and ureter, unspecified: N28.9

## 2016-07-19 HISTORY — DX: Acute myocardial infarction, unspecified: I21.9

## 2016-07-19 LAB — COMPREHENSIVE METABOLIC PANEL
ALT: 25 U/L (ref 14–54)
AST: 27 U/L (ref 15–41)
Albumin: 4.6 g/dL (ref 3.5–5.0)
Alkaline Phosphatase: 79 U/L (ref 38–126)
Anion gap: 8 (ref 5–15)
BILIRUBIN TOTAL: 0.5 mg/dL (ref 0.3–1.2)
BUN: 16 mg/dL (ref 6–20)
CALCIUM: 9.2 mg/dL (ref 8.9–10.3)
CO2: 30 mmol/L (ref 22–32)
CREATININE: 0.81 mg/dL (ref 0.44–1.00)
Chloride: 97 mmol/L — ABNORMAL LOW (ref 101–111)
Glucose, Bld: 99 mg/dL (ref 65–99)
Potassium: 3.2 mmol/L — ABNORMAL LOW (ref 3.5–5.1)
Sodium: 135 mmol/L (ref 135–145)
TOTAL PROTEIN: 7.7 g/dL (ref 6.5–8.1)

## 2016-07-19 LAB — URINALYSIS, ROUTINE W REFLEX MICROSCOPIC
Bilirubin Urine: NEGATIVE
GLUCOSE, UA: NEGATIVE mg/dL
KETONES UR: NEGATIVE mg/dL
LEUKOCYTES UA: NEGATIVE
NITRITE: NEGATIVE
PROTEIN: NEGATIVE mg/dL
Specific Gravity, Urine: 1.02 (ref 1.005–1.030)
pH: 6 (ref 5.0–8.0)

## 2016-07-19 LAB — CBC
HCT: 40.9 % (ref 36.0–46.0)
Hemoglobin: 13.8 g/dL (ref 12.0–15.0)
MCH: 30.4 pg (ref 26.0–34.0)
MCHC: 33.7 g/dL (ref 30.0–36.0)
MCV: 90.1 fL (ref 78.0–100.0)
PLATELETS: 308 10*3/uL (ref 150–400)
RBC: 4.54 MIL/uL (ref 3.87–5.11)
RDW: 13.3 % (ref 11.5–15.5)
WBC: 9.9 10*3/uL (ref 4.0–10.5)

## 2016-07-19 LAB — URINE MICROSCOPIC-ADD ON

## 2016-07-19 LAB — LIPASE, BLOOD: LIPASE: 22 U/L (ref 11–51)

## 2016-07-19 MED ORDER — KETOROLAC TROMETHAMINE 30 MG/ML IJ SOLN
60.0000 mg | Freq: Once | INTRAMUSCULAR | Status: AC
  Administered 2016-07-19: 60 mg via INTRAMUSCULAR
  Filled 2016-07-19: qty 2

## 2016-07-19 MED ORDER — HYDROMORPHONE HCL 1 MG/ML IJ SOLN
1.0000 mg | Freq: Once | INTRAMUSCULAR | Status: AC
  Administered 2016-07-19: 1 mg via INTRAVENOUS
  Filled 2016-07-19: qty 1

## 2016-07-19 MED ORDER — HYDROCODONE-ACETAMINOPHEN 5-325 MG PO TABS
1.0000 | ORAL_TABLET | ORAL | 0 refills | Status: DC | PRN
Start: 2016-07-19 — End: 2016-11-03

## 2016-07-19 MED ORDER — HYOSCYAMINE SULFATE 0.125 MG PO TABS
0.2500 mg | ORAL_TABLET | Freq: Once | ORAL | Status: AC
  Administered 2016-07-19: 0.25 mg via ORAL
  Filled 2016-07-19: qty 2

## 2016-07-19 MED ORDER — ONDANSETRON HCL 4 MG/2ML IJ SOLN
4.0000 mg | Freq: Once | INTRAMUSCULAR | Status: AC
  Administered 2016-07-19: 4 mg via INTRAVENOUS
  Filled 2016-07-19: qty 2

## 2016-07-19 MED ORDER — MORPHINE SULFATE (PF) 2 MG/ML IV SOLN
4.0000 mg | Freq: Once | INTRAVENOUS | Status: AC
  Administered 2016-07-19: 4 mg via INTRAVENOUS
  Filled 2016-07-19: qty 2

## 2016-07-19 MED ORDER — HYOSCYAMINE SULFATE 0.125 MG SL SUBL: 0.2500 mg | SUBLINGUAL_TABLET | Freq: Four times a day (QID) | SUBLINGUAL | 0 refills | Status: DC | PRN

## 2016-07-19 NOTE — ED Triage Notes (Signed)
Pt c/o RT sided flank pain, burning with urination, and nausea that began this afternoon. PMH of kidney stones.

## 2016-07-19 NOTE — ED Provider Notes (Signed)
[facility] [facility] DEPT Provider Note   CSN: [id number] Arrival date & time: 07/19/16  1658     History   Chief Complaint Chief Complaint  Patient presents with  . Flank Pain    HPI [name] is a 60 y.o. female with a Past medical history as outlined below significant for distant history of kidney stone and also surgical history of cholecystectomy and right salpingectomy secondary to ectopic pregnancy presenting with right abdominal and flank pain which started gradually over the course of this afternoon.  She describes low-grade pain originating in her right upper quadrant and flank area which has become progressively worsened, now including the epigastric and right lower quadrant in association with nausea without emesis.  She endorses burning pain with urination but has had no increased frequency.  She states her pain feels like when she had an ovarian cyst only this pain is higher.  Pain is now constant and severe and not worsened with position or movement.  She took a tramadol prior to arrival without improvement in pain..  Denies fevers or chills, no diarrhea.  Last BM was this morning and normal.  She last ate an apple several hours before onset of symptoms. Pt has had multiple episodes of similar pain in the past, and has escalated over the past several months in frequency and severity.  She has seen Dr. Laural Golden in the past for this problem.   The history is provided by the patient.    Past Medical History:  Diagnosis Date  . Anxiety   . CAD (coronary artery disease)    PCI Stent RCA 03/12/11.  Cath 03/22/11 LVEF 55-60%.  RCA patent  proximal/distal stents with 30% proximal disease and 20% mid stenosis.  LCX with 30%  mid stenosis.  Questionable spasm.  . Carotid stenosis    Less than 50% 8/11  . GERD (gastroesophageal reflux disease) 03/02/2014  . H/O hiatal hernia   . HTN (hypertension)   . Hyperlipidemia   . Myocardial infarction ([place])   . Renal disorder    kidney stone     Patient Active Problem List   Diagnosis Date Noted  . GERD (gastroesophageal reflux disease) 03/02/2014  . Family hx of colon cancer 03/02/2014  . 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 Arthritis of knee 10/31/2011  . Tobacco abuse 03/28/2011  . Obesity 03/28/2011  . CAD (coronary artery disease)   . HTN (hypertension)   . Hyperlipidemia   . Anxiety     Past Surgical History:  Procedure Laterality Date  . CARDIAC SURGERY     stent placement  . CHOLECYSTECTOMY    . COLONOSCOPY N/A 04/01/2014   Procedure: COLONOSCOPY;  Surgeon: Rogene Houston, MD;  [facility]: [facility];  Service: Endoscopy;  Laterality: N/A;  100  . Cyst removed from spine    . ECTOPIC PREGNANCY SURGERY    . ESOPHAGOGASTRODUODENOSCOPY N/A 04/01/2014   Procedure: ESOPHAGOGASTRODUODENOSCOPY (EGD);  Surgeon: Rogene Houston, MD;  [facility];  Service: Endoscopy;  Laterality: N/A;  100  . Heart stent     2012  x 2 stents  . Left knee     Arthroscopic  . Left wrist    . TONSILLECTOMY AND ADENOIDECTOMY    . TOTAL KNEE ARTHROPLASTY Left 09/26/2013   Procedure: TOTAL KNEE ARTHROPLASTY- LEFT;  Surgeon: [name]., MD;  Location: [facility];  Service: Orthopedics;  Laterality: Left;  . TUBAL LIGATION      OB History  No data available       Home Medications    Prior to Admission medications   Medication Sig Start Date End Date Taking? Authorizing Provider  acetaminophen (TYLENOL) 500 MG tablet Take 500 mg by mouth every 6 (six) hours as needed for mild pain or moderate pain.   Yes Historical Provider, MD  Ascorbic Acid (VITAMIN C) 1000 MG tablet Take 1,000 mg by mouth daily.   Yes Historical Provider, MD  aspirin 81 MG tablet Take 81 mg by mouth daily.   Yes Historical Provider, MD  diltiazem (CARDIZEM CD) 120 MG 24 hr capsule Take 1 capsule (120 mg total) by mouth daily. 05/15/16  Yes Herminio Commons, MD  DULoxetine (CYMBALTA) 60 MG capsule Take 60 mg by mouth every morning. 04/06/15  Yes Historical  Provider, MD  EPINEPHrine 0.3 mg/0.3 mL IJ SOAJ injection Self inject per package instructions as needed for severe allergic reaction and seek medical attention. 03/13/15  Yes John Molpus, MD  fexofenadine (ALLEGRA) 180 MG tablet Take 180 mg by mouth daily.   Yes Historical Provider, MD  furosemide (LASIX) 40 MG tablet Take 1 tablet by mouth daily. 04/06/15  Yes Historical Provider, MD  hydrocortisone (PROCTOSOL HC) 2.5 % rectal cream APPLY RECTALLY TWICE A DAY AS DIRECTED 04/18/16  Yes Butch Penny, NP  isosorbide mononitrate (IMDUR) 30 MG 24 hr tablet Take 1 tablet (30 mg total) by mouth daily. 04/26/16  Yes Herminio Commons, MD  lisinopril-hydrochlorothiazide (PRINZIDE,ZESTORETIC) 10-12.5 MG tablet Take 1 tablet by mouth daily. 06/21/16  Yes Historical Provider, MD  Multiple Vitamin (MULTIVITAMIN) tablet Take 1 tablet by mouth daily.   Yes Historical Provider, MD  Naphazoline-Pheniramine 0.027-0.315 % SOLN Apply 1 drop to eye 3 (three) times daily as needed (FOR ITCHY EYE/IRRITATION).    Yes Historical Provider, MD  nitroGLYCERIN (NITROSTAT) 0.4 MG SL tablet Place 1 tablet (0.4 mg total) under the tongue every 5 (five) minutes as needed for chest pain. 11/30/15  Yes Herminio Commons, MD  Probiotic Product (PROBIOTIC DAILY PO) Take 1 tablet by mouth daily.   Yes Historical Provider, MD  RABEprazole (ACIPHEX) 20 MG tablet TAKE 1 TABLET (20 MG TOTAL) BY MOUTH 2 (TWO) TIMES DAILY BEFORE A MEAL.3 05/24/16  Yes Butch Penny, NP  sucralfate (CARAFATE) 1 g tablet Take 1 g by mouth 4 (four) times daily -  with meals and at bedtime.   Yes Historical Provider, MD  traMADol (ULTRAM) 50 MG tablet Take 100 mg by mouth 3 (three) times daily.    Yes Historical Provider, MD  zolpidem (AMBIEN) 10 MG tablet Take 10 mg by mouth at bedtime as needed. 03/23/16  Yes Historical Provider, MD  HYDROcodone-acetaminophen (NORCO/VICODIN) 5-325 MG tablet Take 1 tablet by mouth every 4 (four) hours as needed. 07/19/16   Evalee Jefferson, PA-C  hyoscyamine (LEVSIN/SL) 0.125 MG SL tablet Place 2 tablets (0.25 mg total) under the tongue every 6 (six) hours as needed (abdominal pain). 07/19/16   Evalee Jefferson, PA-C    Family History Family History  Problem Relation Age of Onset  . Colon cancer Father   . Heart disease    . Arthritis    . Cancer      Social History Social History  Substance Use Topics  . Smoking status: Former Smoker    Packs/day: 1.00    Years: 10.00    Types: Cigarettes    Start date: 06/27/1979    Quit date: 11/27/2010  . Smokeless tobacco: Never  Used  . Alcohol use 0.0 oz/week     Comment: social     Allergies   Ancef [cefazolin]; Ciprofloxacin hcl; Metaxalone; and Sulfa antibiotics   Review of Systems Review of Systems  Constitutional: Negative for chills and fever.  HENT: Negative for congestion and sore throat.   Eyes: Negative.   Respiratory: Negative for chest tightness and shortness of breath.   Cardiovascular: Negative for chest pain.  Gastrointestinal: Positive for abdominal distention, abdominal pain, nausea and vomiting. Negative for constipation and diarrhea.  Genitourinary: Positive for dysuria and flank pain. Negative for hematuria, pelvic pain, urgency and vaginal discharge.  Musculoskeletal: Negative for arthralgias, joint swelling and neck pain.  Skin: Negative.  Negative for rash and wound.  Neurological: Negative for dizziness, weakness, light-headedness, numbness and headaches.  Psychiatric/Behavioral: Negative.      Physical Exam Updated Vital Signs BP 122/67 (BP Location: Left Arm)   Pulse 73   Temp 98.3 F (36.8 C) (Oral)   Resp 18   Ht 5\' 2"  (1.575 m)   Wt 78 kg   SpO2 96%   BMI 31.46 kg/m   Physical Exam  Constitutional: She appears well-developed and well-nourished.  HENT:  Head: Normocephalic and atraumatic.  Eyes: Conjunctivae are normal.  Neck: Normal range of motion.  Cardiovascular: Normal rate, regular rhythm, normal heart sounds and  intact distal pulses.   Pulmonary/Chest: Effort normal and breath sounds normal. She has no wheezes.  Abdominal: Soft. Bowel sounds are normal. She exhibits no mass. There is tenderness in the right upper quadrant, right lower quadrant and epigastric area. There is guarding and CVA tenderness. There is no rebound.  Right CVA tenderness.  Abdominal distention noted.  Normal bowel sounds.  No increased tympany.  Musculoskeletal: Normal range of motion.  Neurological: She is alert.  Skin: Skin is warm and dry.  Psychiatric: She has a normal mood and affect.  Nursing note and vitals reviewed.    ED Treatments / Results  Labs (all labs ordered are listed, but only abnormal results are displayed) Labs Reviewed  COMPREHENSIVE METABOLIC PANEL - Abnormal; Notable for the following:       Result Value   Potassium 3.2 (*)    Chloride 97 (*)    All other components within normal limits  URINALYSIS, ROUTINE W REFLEX MICROSCOPIC (NOT AT [facility]) - Abnormal; Notable for the following:    Hgb urine dipstick TRACE (*)    All other components within normal limits  URINE MICROSCOPIC-ADD ON - Abnormal; Notable for the following:    Squamous Epithelial / LPF 0-5 (*)    Bacteria, UA FEW (*)    All other components within normal limits  LIPASE, BLOOD  CBC    EKG  EKG Interpretation None       Radiology Mr Pelvis Wo Contrast  Result Date: 07/19/2016 CLINICAL DATA:  Right lower quadrant/right flank pain x1 day, status post cholecystectomy EXAM: MRI ABDOMEN AND PELVIS WITHOUT CONTRAST TECHNIQUE: Multiplanar multisequence MR imaging of the abdomen and pelvis was performed. No intravenous contrast was administered. COMPARISON:  CT abdomen pelvis dated 04/10/2016 FINDINGS: Lower chest:  Lung bases are clear. Hepatobiliary: Liver is within normal limits. Status post cholecystectomy. No intrahepatic or extrahepatic ductal dilatation. No choledocholithiasis is seen. Pancreas: Within normal limits. Spleen:  Within normal limits. Adrenals/Urinary Tract: Adrenal glands are within normal limits. Tiny left renal cysts measuring up to 7 mm. Two right upper pole renal cyst measuring up to 3.5 cm (series 12/image 20), with mildly irregular  walls, but without solid components. No hydronephrosis. Bladder is within normal limits. Stomach/Bowel: Stomach is notable for a tiny hiatal hernia. Visualized bowel is unremarkable. Normal appendix (series 2/ image 14). Left colonic diverticulosis, without evidence of diverticulitis. Vascular/Lymphatic: No evidence abdominal aortic aneurysm. No suspicious abdominal lymphadenopathy. Reproductive: Uterus is within normal limits. No adnexal masses. Other: No abdominopelvic ascites. Musculoskeletal: No focal osseous lesions. IMPRESSION: No evidence of bowel obstruction.  Normal appendix. Left colonic diverticulosis, without evidence of diverticulitis. Status post cholecystectomy. No intrahepatic or extrahepatic ductal dilatation. No choledocholithiasis is seen. Additional ancillary findings as above. Electronically Signed   By: Julian Hy M.D.   On: 07/19/2016 20:53   Mr Abdomen Wo Contrast  Result Date: 07/19/2016 CLINICAL DATA:  Right lower quadrant/right flank pain x1 day, status post cholecystectomy EXAM: MRI ABDOMEN AND PELVIS WITHOUT CONTRAST TECHNIQUE: Multiplanar multisequence MR imaging of the abdomen and pelvis was performed. No intravenous contrast was administered. COMPARISON:  CT abdomen pelvis dated 04/10/2016 FINDINGS: Lower chest:  Lung bases are clear. Hepatobiliary: Liver is within normal limits. Status post cholecystectomy. No intrahepatic or extrahepatic ductal dilatation. No choledocholithiasis is seen. Pancreas: Within normal limits. Spleen: Within normal limits. Adrenals/Urinary Tract: Adrenal glands are within normal limits. Tiny left renal cysts measuring up to 7 mm. Two right upper pole renal cyst measuring up to 3.5 cm (series 12/image 20), with mildly  irregular walls, but without solid components. No hydronephrosis. Bladder is within normal limits. Stomach/Bowel: Stomach is notable for a tiny hiatal hernia. Visualized bowel is unremarkable. Normal appendix (series 2/ image 14). Left colonic diverticulosis, without evidence of diverticulitis. Vascular/Lymphatic: No evidence abdominal aortic aneurysm. No suspicious abdominal lymphadenopathy. Reproductive: Uterus is within normal limits. No adnexal masses. Other: No abdominopelvic ascites. Musculoskeletal: No focal osseous lesions. IMPRESSION: No evidence of bowel obstruction.  Normal appendix. Left colonic diverticulosis, without evidence of diverticulitis. Status post cholecystectomy. No intrahepatic or extrahepatic ductal dilatation. No choledocholithiasis is seen. Additional ancillary findings as above. Electronically Signed   By: Julian Hy M.D.   On: 07/19/2016 20:53    Procedures Procedures (including critical care time)  Medications Ordered in ED Medications  ketorolac (TORADOL) 30 MG/ML injection 60 mg (60 mg Intramuscular Given 07/19/16 1732)  morphine 2 MG/ML injection 4 mg (4 mg Intravenous Given 07/19/16 1806)  ondansetron (ZOFRAN) injection 4 mg (4 mg Intravenous Given 07/19/16 1806)  HYDROmorphone (DILAUDID) injection 1 mg (1 mg Intravenous Given 07/19/16 2049)  hyoscyamine (LEVSIN, ANASPAZ) tablet 0.25 mg (0.25 mg Oral Given 07/19/16 2303)     Initial Impression / Assessment and Plan / ED Course  I have reviewed the triage vital signs and the nursing notes.  Pertinent labs & imaging results that were available during my care of the patient were reviewed by me and considered in my medical decision making (see chart for details).  Clinical Course    Exam most concerning for possible appy vs obstruction.  Unable to obtain Ct at this time, therefore mri deployed and negative for these processes.  No renal stones. She does have renal cysts which were present on prior imaging.  This was discussed with pt but felt unlikely to be related to her sx.  Given intermittent episodes, ? Functional such as IBS or gas trapping/intestinal spasms.  She was given levsin to try, also prescribed a few hydrocodone if needed for pain relief. Advised f/u with pcp and/or Dr. Laural Golden  For recheck of sx.  Re=exam and no guarding, pain improved.    Final Clinical  Impressions(s) / ED Diagnoses   Final diagnoses:  Abdominal pain, unspecified abdominal location    New Prescriptions Discharge Medication List as of 07/19/2016 10:49 PM    START taking these medications   Details  HYDROcodone-acetaminophen (NORCO/VICODIN) 5-325 MG tablet Take 1 tablet by mouth every 4 (four) hours as needed., Starting Wed 07/19/2016, Print    hyoscyamine (LEVSIN/SL) 0.125 MG SL tablet Place 2 tablets (0.25 mg total) under the tongue every 6 (six) hours as needed (abdominal pain)., Starting Wed 07/19/2016, Print         Evalee Jefferson, PA-C 07/20/16 [place], MD 07/20/16 1705

## 2016-07-19 NOTE — Discharge Instructions (Signed)
As discussed your labs and imaging tonight does not give [place] the source of your pain.  You do have small cysts in your kidney but this has been present before and should not be the source of your symptoms.  I have prescribed you levsin which is a medicine that can relieve intestinal spasms which may be the source of your pain.  However, you also have hydrocodone prescribed for pain relief if the levsin is not effective.  This will make you drowsy - do not drive within 4 hours of taking this medication.  Call Dr. Laural Golden for further evaluation if your symptoms persist.

## 2016-08-01 DIAGNOSIS — Z6831 Body mass index (BMI) 31.0-31.9, adult: Secondary | ICD-10-CM | POA: Diagnosis not present

## 2016-08-01 DIAGNOSIS — M545 Low back pain: Secondary | ICD-10-CM | POA: Diagnosis not present

## 2016-08-01 DIAGNOSIS — M5416 Radiculopathy, lumbar region: Secondary | ICD-10-CM | POA: Diagnosis not present

## 2016-08-01 DIAGNOSIS — M797 Fibromyalgia: Secondary | ICD-10-CM | POA: Diagnosis not present

## 2016-08-09 ENCOUNTER — Ambulatory Visit (INDEPENDENT_AMBULATORY_CARE_PROVIDER_SITE_OTHER): Payer: BLUE CROSS/BLUE SHIELD | Admitting: Internal Medicine

## 2016-08-09 ENCOUNTER — Encounter (INDEPENDENT_AMBULATORY_CARE_PROVIDER_SITE_OTHER): Payer: Self-pay | Admitting: Internal Medicine

## 2016-08-09 VITALS — BP 130/70 | HR 84 | Temp 98.0°F | Ht 62.0 in | Wt 176.3 lb

## 2016-08-09 DIAGNOSIS — K219 Gastro-esophageal reflux disease without esophagitis: Secondary | ICD-10-CM | POA: Diagnosis not present

## 2016-08-09 MED ORDER — OMEPRAZOLE 40 MG PO CPDR: 40.0000 mg | DELAYED_RELEASE_CAPSULE | Freq: Two times a day (BID) | ORAL | 3 refills | Status: DC

## 2016-08-09 NOTE — Progress Notes (Signed)
   Subjective:    Patient ID: [name], female    DOB: 11-21-1956, 60 y.o.   MRN: [id number]  HPI Presents today with c/o epigastric pain. She says she has been hurting for several months. She says normally she does not have indigestion.  It feels like it is gurgling up in her esophagus. She feels like she has to burp but she can't. She says all foods bother her. She has gained about 6 pounds since her last visit. She usually has BM daily.   04/01/2014 EGD/Colonoscopy: Indications: Patient is 60 year old Caucasian female who has chronic GERD and intermittent double dose PPI who presents with daily epigastric pain described as burning. She has history of CAD and recent cardiac evaluation has been negative. She is undergoing diagnostic EGD followed by high-dose colonoscopy. Father had surgery for colon carcinoma at age 40. Patient was hospitalized 2 months ago at [facility] for left-sided colitis for which she is fully recovered.  Impression:  EGD findings;  Mild changes of reflux esophagitis limited to GE junction.  Small sliding hiatal hernia.  No evidence of peptic ulcer disease or gastritis.  Colonoscopy findings;  Small cecal polyp ablated via cold biopsy.  No evidence of residual colitis(left-sided colitis 8 weeks).  Sigmoid colon diverticulosis with single diverticulum at hepatic flexure.  Small external hemorrhoids.  Comment;  No abnormality noted to account for patient's persistent epigastric pain.  Biopsy;  Patient had small cecal polyp removed and it is sessile serrated polyp She is still having chest pain. Patient advised to take Rebaprazole 20 mg by mouth twice a day; new prescription    Review of Systems     Objective:   Physical Exam Blood pressure 130/70, pulse 84, temperature 98 F (36.7 C), height 5\' 2"  (1.575 m), weight 176 lb 4.8 oz (80 kg). Alert and oriented. Skin warm and dry. Oral mucosa is moist.   . Sclera anicteric, conjunctivae is pink. Thyroid  not enlarged. No cervical lymphadenopathy. Lungs clear. Heart regular rate and rhythm.  Abdomen is soft. Bowel sounds are positive. No hepatomegaly. No abdominal masses felt. No tenderness.  No edema to lower extremities.        Assessment & Plan:  GERD. Am going to switch her to Omeprazole BID for 2 months and then back off to once a day.

## 2016-08-09 NOTE — Patient Instructions (Addendum)
Omeprazole 40mg  twice a day. PR in 3 weeks.

## 2016-08-10 ENCOUNTER — Telehealth (INDEPENDENT_AMBULATORY_CARE_PROVIDER_SITE_OTHER): Payer: Self-pay | Admitting: Internal Medicine

## 2016-08-10 NOTE — Telephone Encounter (Signed)
Patient called, stated that she is getting no relief from the medication given yesterday.  Stated that she's not sure if she tore something when vomiting.  Stated that she hears a constant gurgling.  [phone]

## 2016-08-10 NOTE — Telephone Encounter (Signed)
I advised her if she thinks she tore something to go to the ED. Continue the Omeprzole.

## 2016-08-18 ENCOUNTER — Telehealth: Payer: Self-pay | Admitting: Cardiovascular Disease

## 2016-08-18 NOTE — Telephone Encounter (Signed)
Patient called regarding Repatha approval. Patient advised that the nurse that is handling this is out of the office but message would be sent so that her message could be followed up on next week. Patient verbalized understanding.

## 2016-08-18 NOTE — Telephone Encounter (Signed)
Patient called stating that she was returning a call in reference to her being approved for assistance on medication

## 2016-08-21 MED ORDER — EVOLOCUMAB 140 MG/ML ~~LOC~~ SOAJ: 140.0000 mg | SUBCUTANEOUS | 11 refills | Status: DC

## 2016-08-21 NOTE — Telephone Encounter (Signed)
Patient notified that her Repatha had been approved for 07/25/2016 - 01/24/2017.    Will send new rx to [facility] now.

## 2016-08-25 DIAGNOSIS — Z6831 Body mass index (BMI) 31.0-31.9, adult: Secondary | ICD-10-CM | POA: Diagnosis not present

## 2016-08-25 DIAGNOSIS — M545 Low back pain: Secondary | ICD-10-CM | POA: Diagnosis not present

## 2016-08-25 DIAGNOSIS — M797 Fibromyalgia: Secondary | ICD-10-CM | POA: Diagnosis not present

## 2016-08-25 DIAGNOSIS — M5416 Radiculopathy, lumbar region: Secondary | ICD-10-CM | POA: Diagnosis not present

## 2016-08-29 DIAGNOSIS — M545 Low back pain: Secondary | ICD-10-CM | POA: Diagnosis not present

## 2016-10-10 ENCOUNTER — Encounter (INDEPENDENT_AMBULATORY_CARE_PROVIDER_SITE_OTHER): Payer: Self-pay | Admitting: Internal Medicine

## 2016-10-10 ENCOUNTER — Encounter (INDEPENDENT_AMBULATORY_CARE_PROVIDER_SITE_OTHER): Payer: Self-pay | Admitting: *Deleted

## 2016-10-10 ENCOUNTER — Ambulatory Visit (INDEPENDENT_AMBULATORY_CARE_PROVIDER_SITE_OTHER): Payer: BLUE CROSS/BLUE SHIELD | Admitting: Internal Medicine

## 2016-10-10 ENCOUNTER — Encounter (INDEPENDENT_AMBULATORY_CARE_PROVIDER_SITE_OTHER): Payer: Self-pay

## 2016-10-10 ENCOUNTER — Other Ambulatory Visit (INDEPENDENT_AMBULATORY_CARE_PROVIDER_SITE_OTHER): Payer: Self-pay | Admitting: Internal Medicine

## 2016-10-10 VITALS — BP 120/70 | HR 74 | Temp 98.3°F | Ht 62.0 in | Wt 178.0 lb

## 2016-10-10 DIAGNOSIS — R1013 Epigastric pain: Secondary | ICD-10-CM | POA: Insufficient documentation

## 2016-10-10 DIAGNOSIS — K219 Gastro-esophageal reflux disease without esophagitis: Secondary | ICD-10-CM

## 2016-10-10 MED ORDER — PANTOPRAZOLE SODIUM 40 MG PO TBEC: 40.0000 mg | DELAYED_RELEASE_TABLET | Freq: Two times a day (BID) | ORAL | 2 refills | Status: DC

## 2016-10-10 NOTE — Progress Notes (Signed)
Subjective:    Patient ID: [name], female    DOB: 1956-08-31, 60 y.o.   MRN: [id number]  HPI Here today for f/u. She was last seen in September. She has gained 2 pounds since her last visit. She tells me she has epigastric pain. She says the pain is not any better. Pain since May.  She says the Omeprazole helps but not completely. Sh e has a hard time burping.  Rates the pain 9/10 "all the time". She is not taking NSAIDS except for the low dose ASA. She would like Dr. Laural Golden to look at her stomach again.  Her appetite is okay.She has fullness in the morning.  She usually has BM in am. No melena or BRRB. She says she is avoiding spicy foods.      04/01/2014 EGD/Colonoscopy:Indications:Patient is 60 year old Caucasian female who has chronic GERD and intermittent double dose PPI who presents with daily epigastric pain described as burning. She has history of CAD and recent cardiac evaluation has been negative. She is undergoing diagnostic EGD followed by high-dose colonoscopy. Father had surgery for colon carcinoma at age 70. Patient was hospitalized 2 months ago at [facility] for left-sided colitis for which she is fully recovered.  Impression:  EGD findings;  Mild changes of reflux esophagitis limited to GE junction.  Small sliding hiatal hernia.  No evidence of peptic ulcer disease or gastritis.  Colonoscopy findings; Small cecal polyp ablated via cold biopsy.  No evidence of residual colitis(left-sided colitis 8 weeks).  Sigmoid colon diverticulosis with single diverticulum at hepatic flexure.  Small external hemorrhoids.  Comment; No abnormality noted to account for patient's persistent epigastric pain.  Biopsy;  Patient had small cecal polyp removed and it is sessile serrated polyp She is still having chest pain. Patient advised to take Rebaprazole 20 mg by mouth twice a day; new prescription     Review of Systems Past Medical History:  Diagnosis Date  .  Anxiety   . CAD (coronary artery disease)    PCI Stent RCA 03/12/11.  Cath 03/22/11 LVEF 55-60%.  RCA patent  proximal/distal stents with 30% proximal disease and 20% mid stenosis.  LCX with 30%  mid stenosis.  Questionable spasm.  . Carotid stenosis    Less than 50% 8/11  . GERD (gastroesophageal reflux disease) 03/02/2014  . H/O hiatal hernia   . HTN (hypertension)   . Hyperlipidemia   . Myocardial infarction   . Renal disorder    kidney stone    Past Surgical History:  Procedure Laterality Date  . CARDIAC SURGERY     stent placement  . CHOLECYSTECTOMY    . COLONOSCOPY N/A 04/01/2014   Procedure: COLONOSCOPY;  Surgeon: Rogene Houston, MD;  Location: [facility];  Service: Endoscopy;  Laterality: N/A;  100  . Cyst removed from spine    . ECTOPIC PREGNANCY SURGERY    . ESOPHAGOGASTRODUODENOSCOPY N/A 04/01/2014   Procedure: ESOPHAGOGASTRODUODENOSCOPY (EGD);  Surgeon: Rogene Houston, MD;  Location: [facility];  Service: Endoscopy;  Laterality: N/A;  100  . Heart stent     2012  x 2 stents  . Left knee     Arthroscopic  . Left wrist    . TONSILLECTOMY AND ADENOIDECTOMY    . TOTAL KNEE ARTHROPLASTY Left 09/26/2013   Procedure: TOTAL KNEE ARTHROPLASTY- LEFT;  Surgeon: [name]., MD;  Location: [facility];  Service: Orthopedics;  Laterality: Left;  . TUBAL LIGATION      Allergies  Allergen Reactions  . Ancef [Cefazolin] Itching    Itching around mouth - sx resolved quickly after administration of benadryl; with no further sx  . Ciprofloxacin Hcl   . Metaxalone   . Sulfa Antibiotics Itching, Rash and Other (See Comments)    "severe muscle cramps, tongue cracked, dry mouth "    Current Outpatient Prescriptions on File Prior to Visit  Medication Sig Dispense Refill  . acetaminophen (TYLENOL) 500 MG tablet Take 500 mg by mouth every 6 (six) hours as needed for mild pain or moderate pain.    . Ascorbic Acid (VITAMIN C) 1000 MG tablet Take 1,000 mg by mouth daily.    Marland Kitchen aspirin  81 MG tablet Take 81 mg by mouth daily.    . cyclobenzaprine (FLEXERIL) 10 MG tablet Take 10 mg by mouth 3 (three) times daily as needed for muscle spasms.    Marland Kitchen diltiazem (CARDIZEM CD) 120 MG 24 hr capsule Take 1 capsule (120 mg total) by mouth daily. 90 capsule 3  . DULoxetine (CYMBALTA) 60 MG capsule Take 60 mg by mouth every morning.  3  . EPINEPHrine 0.3 mg/0.3 mL IJ SOAJ injection Self inject per package instructions as needed for severe allergic reaction and seek medical attention. 2 Device 0  . Evolocumab (REPATHA SURECLICK) [id number] MG/ML SOAJ Inject 140 mg into the skin every 14 (fourteen) days. 28 pen 11  . fexofenadine (ALLEGRA) 180 MG tablet Take 180 mg by mouth daily.    . furosemide (LASIX) 40 MG tablet Take 1 tablet by mouth daily.  12  . HYDROcodone-acetaminophen (NORCO/VICODIN) 5-325 MG tablet Take 1 tablet by mouth every 4 (four) hours as needed. 20 tablet 0  . hydrocortisone (PROCTOSOL HC) 2.5 % rectal cream APPLY RECTALLY TWICE A DAY AS DIRECTED 28.35 g 2  . hyoscyamine (LEVSIN/SL) 0.125 MG SL tablet Place 2 tablets (0.25 mg total) under the tongue every 6 (six) hours as needed (abdominal pain). 30 tablet 0  . isosorbide mononitrate (IMDUR) 30 MG 24 hr tablet Take 1 tablet (30 mg total) by mouth daily. 90 tablet 3  . lisinopril-hydrochlorothiazide (PRINZIDE,ZESTORETIC) 10-12.5 MG tablet Take 1 tablet by mouth daily.  3  . Multiple Vitamin (MULTIVITAMIN) tablet Take 1 tablet by mouth daily.    . Naphazoline-Pheniramine 0.027-0.315 % SOLN Apply 1 drop to eye 3 (three) times daily as needed (FOR ITCHY EYE/IRRITATION).     Marland Kitchen nitroGLYCERIN (NITROSTAT) 0.4 MG SL tablet Place 1 tablet (0.4 mg total) under the tongue every 5 (five) minutes as needed for chest pain. 25 tablet 3  . omeprazole (PRILOSEC) 40 MG capsule Take 1 capsule (40 mg total) by mouth 2 (two) times daily. 60 capsule 3  . Probiotic Product (PROBIOTIC DAILY PO) Take 1 tablet by mouth daily.    . sucralfate (CARAFATE) 1 g  tablet Take 1 g by mouth 4 (four) times daily -  with meals and at bedtime.    . traMADol (ULTRAM) 50 MG tablet Take 100 mg by mouth 3 (three) times daily.     Marland Kitchen zolpidem (AMBIEN) 10 MG tablet Take 10 mg by mouth at bedtime as needed.  3   No current facility-administered medications on file prior to visit.        Objective:   Physical Exam Blood pressure 120/70, pulse 74, temperature 98.3 F (36.8 C), height 5\' 2"  (1.575 m), weight 178 lb (80.7 kg). Alert and oriented. Skin warm and dry. Oral mucosa is moist.   . Sclera anicteric, conjunctivae is  pink. Thyroid not enlarged. No cervical lymphadenopathy. Lungs clear. Heart regular rate and rhythm.  Abdomen is soft. Bowel sounds are positive. No hepatomegaly. No abdominal masses felt. No tenderness.  No edema to lower extremities.  .      Assessment & Plan:  Uncontrolled GERD. PUD needs to be ruled out.  Will switch her to Protonix BID till her procedure. EGD.

## 2016-10-10 NOTE — Patient Instructions (Signed)
EGD. The risks and benefits such as perforation, bleeding, and infection were reviewed with the patient and is agreeable. 

## 2016-10-16 DIAGNOSIS — M170 Bilateral primary osteoarthritis of knee: Secondary | ICD-10-CM | POA: Diagnosis not present

## 2016-10-16 DIAGNOSIS — F419 Anxiety disorder, unspecified: Secondary | ICD-10-CM | POA: Diagnosis not present

## 2016-10-16 DIAGNOSIS — M545 Low back pain: Secondary | ICD-10-CM | POA: Diagnosis not present

## 2016-10-16 DIAGNOSIS — I10 Essential (primary) hypertension: Secondary | ICD-10-CM | POA: Diagnosis not present

## 2016-10-27 ENCOUNTER — Emergency Department (HOSPITAL_COMMUNITY): Payer: BLUE CROSS/BLUE SHIELD

## 2016-10-27 ENCOUNTER — Emergency Department (HOSPITAL_COMMUNITY)
Admission: EM | Admit: 2016-10-27 | Discharge: 2016-10-27 | Disposition: A | Discharge status: Home / Self Care | Payer: BLUE CROSS/BLUE SHIELD | Attending: Emergency Medicine | Admitting: Emergency Medicine

## 2016-10-27 ENCOUNTER — Encounter (HOSPITAL_COMMUNITY): Payer: Self-pay | Admitting: *Deleted

## 2016-10-27 DIAGNOSIS — R05 Cough: Secondary | ICD-10-CM | POA: Diagnosis not present

## 2016-10-27 DIAGNOSIS — Z7982 Long term (current) use of aspirin: Secondary | ICD-10-CM | POA: Insufficient documentation

## 2016-10-27 DIAGNOSIS — Z87891 Personal history of nicotine dependence: Secondary | ICD-10-CM | POA: Diagnosis not present

## 2016-10-27 DIAGNOSIS — I2510 Atherosclerotic heart disease of native coronary artery without angina pectoris: Secondary | ICD-10-CM | POA: Diagnosis not present

## 2016-10-27 DIAGNOSIS — B9789 Other viral agents as the cause of diseases classified elsewhere: Secondary | ICD-10-CM

## 2016-10-27 DIAGNOSIS — I10 Essential (primary) hypertension: Secondary | ICD-10-CM | POA: Diagnosis not present

## 2016-10-27 DIAGNOSIS — I252 Old myocardial infarction: Secondary | ICD-10-CM | POA: Diagnosis not present

## 2016-10-27 DIAGNOSIS — J069 Acute upper respiratory infection, unspecified: Secondary | ICD-10-CM | POA: Insufficient documentation

## 2016-10-27 DIAGNOSIS — J309 Allergic rhinitis, unspecified: Secondary | ICD-10-CM

## 2016-10-27 DIAGNOSIS — R0789 Other chest pain: Secondary | ICD-10-CM | POA: Diagnosis not present

## 2016-10-27 DIAGNOSIS — Z79899 Other long term (current) drug therapy: Secondary | ICD-10-CM | POA: Insufficient documentation

## 2016-10-27 LAB — CBC WITH DIFFERENTIAL/PLATELET
BASOS ABS: 0.1 10*3/uL (ref 0.0–0.1)
Basophils Relative: 1 %
EOS PCT: 7 %
Eosinophils Absolute: 0.4 10*3/uL (ref 0.0–0.7)
HCT: 39.7 % (ref 36.0–46.0)
Hemoglobin: 13.4 g/dL (ref 12.0–15.0)
LYMPHS PCT: 22 %
Lymphs Abs: 1.4 10*3/uL (ref 0.7–4.0)
MCH: 31.2 pg (ref 26.0–34.0)
MCHC: 33.8 g/dL (ref 30.0–36.0)
MCV: 92.5 fL (ref 78.0–100.0)
MONO ABS: 1 10*3/uL (ref 0.1–1.0)
Monocytes Relative: 15 %
Neutro Abs: 3.5 10*3/uL (ref 1.7–7.7)
Neutrophils Relative %: 55 %
PLATELETS: 296 10*3/uL (ref 150–400)
RBC: 4.29 MIL/uL (ref 3.87–5.11)
RDW: 13.9 % (ref 11.5–15.5)
WBC: 6.4 10*3/uL (ref 4.0–10.5)

## 2016-10-27 LAB — BASIC METABOLIC PANEL
Anion gap: 9 (ref 5–15)
BUN: 9 mg/dL (ref 6–20)
CALCIUM: 9.3 mg/dL (ref 8.9–10.3)
CO2: 26 mmol/L (ref 22–32)
CREATININE: 0.74 mg/dL (ref 0.44–1.00)
Chloride: 102 mmol/L (ref 101–111)
GFR calc non Af Amer: >60 mL/min (ref >60)
GLUCOSE: 100 mg/dL — AB (ref 65–99)
Potassium: 4.1 mmol/L (ref 3.5–5.1)
Sodium: 137 mmol/L (ref 135–145)

## 2016-10-27 LAB — TROPONIN I: Troponin I: <0.03 ng/mL (ref <0.03)

## 2016-10-27 MED ORDER — CETIRIZINE HCL 10 MG PO TABS: 10.0000 mg | ORAL_TABLET | Freq: Every day | ORAL | 1 refills | Status: DC

## 2016-10-27 MED ORDER — BENZONATATE 100 MG PO CAPS: 100.0000 mg | ORAL_CAPSULE | Freq: Three times a day (TID) | ORAL | 0 refills | Status: DC | PRN

## 2016-10-27 MED ORDER — FLUTICASONE PROPIONATE 50 MCG/ACT NA SUSP: 2.0000 | Freq: Every day | NASAL | 0 refills | Status: DC

## 2016-10-27 MED ORDER — ACETAMINOPHEN 325 MG PO TABS: 650.0000 mg | ORAL_TABLET | Freq: Four times a day (QID) | ORAL | 0 refills | Status: DC | PRN

## 2016-10-27 NOTE — ED Triage Notes (Signed)
Pt comes in with cough starting around thanksgiving that has progressively gotten worse over the last 3 days. Pt states she is coughing up brown sputum. Daughter is a respiratory therapist and states she has heard crackles in her lungs. Pt states she is having chest pain as well. This started before the cough.

## 2016-10-27 NOTE — ED Provider Notes (Signed)
[facility] DEPT Provider Note   CSN: [id number] Arrival date & time: 10/27/16  0850     History   Chief Complaint Chief Complaint  Patient presents with  . Cough    HPI [name] is a 60 y.o. female.  [name] is a 60 y.o. Female who presents to the ED with her daughter complaining of a cough, sneezing, nasal congestion, postnasal drip and subjective fever for the past week that has worsened over the past 3 days. Patient also reports some pain in her chest only with coughing. She also complains of some epigastric pain that has been constant for several months and she is due to have an upper endoscopy for acid reflux next week. She reports this is chronic and unchanged. No treatments attempted prior to arrival. She reports subjective fevers. Patient denies abdominal pain, vomiting, diarrhea, shortness of breath, wheezing, rashes, sore throat, trouble swallowing, neck pain, neck stiffness or urinary symptoms.   The history is provided by the patient. No language interpreter was used.  Cough  Associated symptoms include rhinorrhea. Pertinent negatives include no chest pain, no chills, no headaches, no sore throat, no shortness of breath and no wheezing.    Past Medical History:  Diagnosis Date  . Anxiety   . CAD (coronary artery disease)    PCI Stent RCA 03/12/11.  Cath 03/22/11 LVEF 55-60%.  RCA patent  proximal/distal stents with 30% proximal disease and 20% mid stenosis.  LCX with 30%  mid stenosis.  Questionable spasm.  . Carotid stenosis    Less than 50% 8/11  . GERD (gastroesophageal reflux disease) 03/02/2014  . H/O hiatal hernia   . HTN (hypertension)   . Hyperlipidemia   . Myocardial infarction   . Renal disorder    kidney stone    Patient Active Problem List   Diagnosis Date Noted  . Abdominal pain, epigastric 10/10/2016  . Gastroesophageal reflux disease without esophagitis 10/10/2016  . GERD (gastroesophageal reflux disease) 03/02/2014  . Family  hx of colon cancer 03/02/2014  . 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 Arthritis of knee 10/31/2011  . Tobacco abuse 03/28/2011  . Obesity 03/28/2011  . CAD (coronary artery disease)   . HTN (hypertension)   . Hyperlipidemia   . Anxiety     Past Surgical History:  Procedure Laterality Date  . CARDIAC SURGERY     stent placement  . CHOLECYSTECTOMY    . COLONOSCOPY N/A 04/01/2014   Procedure: COLONOSCOPY;  Surgeon: Rogene Houston, MD;  Location: [facility];  Service: Endoscopy;  Laterality: N/A;  100  . Cyst removed from spine    . ECTOPIC PREGNANCY SURGERY    . ESOPHAGOGASTRODUODENOSCOPY N/A 04/01/2014   Procedure: ESOPHAGOGASTRODUODENOSCOPY (EGD);  Surgeon: Rogene Houston, MD;  Location: [facility];  Service: Endoscopy;  Laterality: N/A;  100  . Heart stent     2012  x 2 stents  . Left knee     Arthroscopic  . Left wrist    . TONSILLECTOMY AND ADENOIDECTOMY    . TOTAL KNEE ARTHROPLASTY Left 09/26/2013   Procedure: TOTAL KNEE ARTHROPLASTY- LEFT;  Surgeon: [name]., MD;  Location: [facility];  Service: Orthopedics;  Laterality: Left;  . TUBAL LIGATION      OB History    No data available       Home Medications    Prior to Admission medications   Medication Sig Start Date End Date Taking? Authorizing Provider  acetaminophen (TYLENOL) 325  MG tablet Take 2 tablets (650 mg total) by mouth every 6 (six) hours as needed. 10/27/16   Waynetta Pean, PA-C  Ascorbic Acid (VITAMIN C) 1000 MG tablet Take 1,000 mg by mouth daily.    Historical Provider, MD  aspirin 81 MG tablet Take 81 mg by mouth daily.    Historical Provider, MD  benzonatate (TESSALON) 100 MG capsule Take 1 capsule (100 mg total) by mouth 3 (three) times daily as needed for cough. 10/27/16   Waynetta Pean, PA-C  cetirizine (ZYRTEC ALLERGY) 10 MG tablet Take 1 tablet (10 mg total) by mouth daily. 10/27/16   Waynetta Pean, PA-C  cyclobenzaprine (FLEXERIL) 10 MG tablet Take 10 mg by mouth 3 (three) times daily as needed  for muscle spasms.    Historical Provider, MD  diltiazem (CARDIZEM CD) 120 MG 24 hr capsule Take 1 capsule (120 mg total) by mouth daily. 05/15/16   Herminio Commons, MD  DULoxetine (CYMBALTA) 60 MG capsule Take 60 mg by mouth every morning. 04/06/15   Historical Provider, MD  EPINEPHrine 0.3 mg/0.3 mL IJ SOAJ injection Self inject per package instructions as needed for severe allergic reaction and seek medical attention. 03/13/15   John Molpus, MD  Evolocumab (REPATHA SURECLICK) [id number] MG/ML SOAJ Inject 140 mg into the skin every 14 (fourteen) days. 08/21/16   Herminio Commons, MD  fexofenadine (ALLEGRA) 180 MG tablet Take 180 mg by mouth daily.    Historical Provider, MD  fluticasone (FLONASE) 50 MCG/ACT nasal spray Place 2 sprays into both nostrils daily. 10/27/16   Waynetta Pean, PA-C  furosemide (LASIX) 40 MG tablet Take 1 tablet by mouth daily. 04/06/15   Historical Provider, MD  HYDROcodone-acetaminophen (NORCO/VICODIN) 5-325 MG tablet Take 1 tablet by mouth every 4 (four) hours as needed. 07/19/16   Evalee Jefferson, PA-C  hydrocortisone (PROCTOSOL HC) 2.5 % rectal cream APPLY RECTALLY TWICE A DAY AS DIRECTED 04/18/16   Butch Penny, NP  hyoscyamine (LEVSIN/SL) 0.125 MG SL tablet Place 2 tablets (0.25 mg total) under the tongue every 6 (six) hours as needed (abdominal pain). 07/19/16   Evalee Jefferson, PA-C  isosorbide mononitrate (IMDUR) 30 MG 24 hr tablet Take 1 tablet (30 mg total) by mouth daily. 04/26/16   Herminio Commons, MD  lisinopril-hydrochlorothiazide (PRINZIDE,ZESTORETIC) 10-12.5 MG tablet Take 1 tablet by mouth daily. 06/21/16   Historical Provider, MD  Multiple Vitamin (MULTIVITAMIN) tablet Take 1 tablet by mouth daily.    Historical Provider, MD  Naphazoline-Pheniramine 0.027-0.315 % SOLN Apply 1 drop to eye 3 (three) times daily as needed (FOR ITCHY EYE/IRRITATION).     Historical Provider, MD  nitroGLYCERIN (NITROSTAT) 0.4 MG SL tablet Place 1 tablet (0.4 mg total) under the tongue every  5 (five) minutes as needed for chest pain. 11/30/15   Herminio Commons, MD  omeprazole (PRILOSEC) 40 MG capsule Take 1 capsule (40 mg total) by mouth 2 (two) times daily. 08/09/16   Butch Penny, NP  pantoprazole (PROTONIX) 40 MG tablet Take 1 tablet (40 mg total) by mouth 2 (two) times daily before a meal. 10/10/16   Butch Penny, NP  Probiotic Product (PROBIOTIC DAILY PO) Take 1 tablet by mouth daily.    Historical Provider, MD  sucralfate (CARAFATE) 1 g tablet Take 1 g by mouth 4 (four) times daily -  with meals and at bedtime.    Historical Provider, MD  traMADol (ULTRAM) 50 MG tablet Take 100 mg by mouth 3 (three) times daily.  Historical Provider, MD  zolpidem (AMBIEN) 10 MG tablet Take 10 mg by mouth at bedtime as needed. 03/23/16   Historical Provider, MD    Family History Family History  Problem Relation Age of Onset  . Colon cancer Father   . Heart disease    . Arthritis    . Cancer      Social History Social History  Substance Use Topics  . Smoking status: Former Smoker    Packs/day: 1.00    Years: 10.00    Types: Cigarettes    Start date: 06/27/1979    Quit date: 11/27/2010  . Smokeless tobacco: Never Used  . Alcohol use 0.0 oz/week     Comment: social     Allergies   Ancef [cefazolin]; Ciprofloxacin hcl; Metaxalone; and Sulfa antibiotics   Review of Systems Review of Systems  Constitutional: Positive for fever (subjective fever ). Negative for chills.  HENT: Positive for congestion, postnasal drip, rhinorrhea and sneezing. Negative for sore throat.   Eyes: Negative for visual disturbance.  Respiratory: Positive for cough. Negative for shortness of breath and wheezing.   Cardiovascular: Negative for chest pain and palpitations.  Gastrointestinal: Positive for abdominal pain (chronic epigastric pain ). Negative for diarrhea, nausea and vomiting.  Genitourinary: Negative for dysuria.  Musculoskeletal: Negative for back pain and neck pain.  Skin: Negative  for rash.  Neurological: Negative for syncope, weakness, light-headedness and headaches.     Physical Exam Updated Vital Signs BP 134/90   Pulse 85   Temp 98.4 F (36.9 C) (Oral)   Resp 14   Ht 5\' 2"  (1.575 m)   Wt 81.6 kg   SpO2 95%   BMI 32.92 kg/m   Physical Exam  Constitutional: She appears well-developed and well-nourished. No distress.  Nontoxic appearing.  HENT:  Head: Normocephalic and atraumatic.  Right Ear: External ear normal.  Left Ear: External ear normal.  Mouth/Throat: Oropharynx is clear and moist.  Boggy nasal turbinates and rhinorrhea present bilaterally. Evidence of postnasal drip. No tonsillar hypertrophy or exudates. Uvula is midline without edema. Mild middle ear effusion bilaterally without TM erythema or loss of landmarks.  Eyes: Conjunctivae are normal. Pupils are equal, round, and reactive to light. Right eye exhibits no discharge. Left eye exhibits no discharge.  Neck: Normal range of motion. Neck supple. No JVD present.  Cardiovascular: Normal rate, regular rhythm, normal heart sounds and intact distal pulses.   Pulmonary/Chest: Effort normal and breath sounds normal. No stridor. No respiratory distress. She has no wheezes. She has no rales. She exhibits tenderness.  Lungs are clear to auscultation bilaterally. No increased work of breathing. No rales or rhonchi. No wheezing. Substernal chest wall has some tenderness to palpation which reproduces her epigastric and chest pain. She reports this is chronic and ongoing.  Abdominal: Soft. There is no tenderness. There is no guarding.  Musculoskeletal: She exhibits no edema or tenderness.  No lower extremity edema or tenderness.  Lymphadenopathy:    She has no cervical adenopathy.  Neurological: She is alert. Coordination normal.  Skin: Skin is warm and dry. Capillary refill takes less than 2 seconds. No rash noted. She is not diaphoretic. No erythema. No pallor.  Psychiatric: She has a normal mood and  affect. Her behavior is normal.  Nursing note and vitals reviewed.    ED Treatments / Results  Labs (all labs ordered are listed, but only abnormal results are displayed) Labs Reviewed  BASIC METABOLIC PANEL - Abnormal; Notable for the following:  Result Value   Glucose, Bld 100 (*)    All other components within normal limits  CBC WITH DIFFERENTIAL/PLATELET  TROPONIN I    EKG  EKG Interpretation  Date/Time:  Friday October 27 2016 09:02:10 EST Ventricular Rate:  90 PR Interval:    QRS Duration: 100 QT Interval:  371 QTC Calculation: [id number] Axis:   77 Text Interpretation:  Sinus rhythm Confirmed by Rogene Houston  MD, SCOTT ([id number]) on 10/27/2016 9:06:27 AM       Radiology Dg Chest 2 View  Result Date: 10/27/2016 CLINICAL DATA:  Cough and congestion for 1 week. Shortness of breath. EXAM: CHEST  2 VIEW COMPARISON:  Chest radiograph Apr 10, 2016 and chest CT Apr 10, 2016 FINDINGS: There is no edema or consolidation. The heart size and pulmonary vascularity are normal. No adenopathy. There is degenerative change in the thoracic spine. There is a stent in the right coronary artery. IMPRESSION: No edema or consolidation. Electronically Signed   By: Lowella Grip III M.D.   On: 10/27/2016 09:33    Procedures Procedures (including critical care time)  Medications Ordered in ED Medications - No data to display   Initial Impression / Assessment and Plan / ED Course  I have reviewed the triage vital signs and the nursing notes.  Pertinent labs & imaging results that were available during my care of the patient were reviewed by me and considered in my medical decision making (see chart for details).  Clinical Course    This is a 60 y.o. Female who presents to the ED with her daughter complaining of a cough, sneezing, nasal congestion, postnasal drip and subjective fever for the past week that has worsened over the past 3 days. Patient also reports some pain in her chest  only with coughing. She also complains of some epigastric pain that has been constant for several months and she is due to have an upper endoscopy for acid reflux next week. She reports this is chronic and unchanged. No treatments attempted prior to arrival. She reports subjective fevers. On exam the patient is afebrile nontoxic appearing. Her lungs are clear to auscultation bilaterally. Evidence of upper respiratory infection. Patient with boggy nasal turbinates and rhinorrhea. Evidence of postnasal drip. Mild middle ear effusion bilaterally. EKG with normal sinus rhythm. Troponin obtained in triage and is not elevated. I have a low suspicion for ACS. BMP and CBC are unremarkable. No leukocytosis. Chest x-ray is unremarkable. No pneumonia. Patient with upper respiratory infection. Will discharge with prescriptions for Zyrtec, Flonase, Tylenol and Tessalon Perles. I discussed strict and specific return precautions. I encouraged push oral fluids. I advised the patient to follow-up with their primary care provider this week. I advised the patient to return to the emergency department with new or worsening symptoms or new concerns. The patient verbalized understanding and agreement with plan.      Final Clinical Impressions(s) / ED Diagnoses   Final diagnoses:  Viral URI with cough  Acute allergic rhinitis, unspecified seasonality, unspecified trigger    New Prescriptions New Prescriptions   ACETAMINOPHEN (TYLENOL) 325 MG TABLET    Take 2 tablets (650 mg total) by mouth every 6 (six) hours as needed.   BENZONATATE (TESSALON) 100 MG CAPSULE    Take 1 capsule (100 mg total) by mouth 3 (three) times daily as needed for cough.   CETIRIZINE (ZYRTEC ALLERGY) 10 MG TABLET    Take 1 tablet (10 mg total) by mouth daily.   FLUTICASONE (FLONASE) 50 MCG/ACT  NASAL SPRAY    Place 2 sprays into both nostrils daily.     Waynetta Pean, PA-C 10/27/16 1009    Fredia Sorrow, MD 10/27/16 [phone]

## 2016-11-03 ENCOUNTER — Encounter (HOSPITAL_COMMUNITY)
Admission: RE | Disposition: A | Discharge status: Home / Self Care | Payer: Self-pay | Source: Ambulatory Visit | Attending: Internal Medicine

## 2016-11-03 ENCOUNTER — Encounter (HOSPITAL_COMMUNITY): Payer: Self-pay | Admitting: *Deleted

## 2016-11-03 ENCOUNTER — Ambulatory Visit (HOSPITAL_COMMUNITY)
Admission: RE | Admit: 2016-11-03 | Discharge: 2016-11-03 | Disposition: A | Discharge status: Home / Self Care | Payer: BLUE CROSS/BLUE SHIELD | Source: Ambulatory Visit | Attending: Internal Medicine | Admitting: Internal Medicine

## 2016-11-03 DIAGNOSIS — Z955 Presence of coronary angioplasty implant and graft: Secondary | ICD-10-CM | POA: Insufficient documentation

## 2016-11-03 DIAGNOSIS — I252 Old myocardial infarction: Secondary | ICD-10-CM | POA: Diagnosis not present

## 2016-11-03 DIAGNOSIS — F419 Anxiety disorder, unspecified: Secondary | ICD-10-CM | POA: Diagnosis not present

## 2016-11-03 DIAGNOSIS — Z7982 Long term (current) use of aspirin: Secondary | ICD-10-CM | POA: Insufficient documentation

## 2016-11-03 DIAGNOSIS — K449 Diaphragmatic hernia without obstruction or gangrene: Secondary | ICD-10-CM | POA: Diagnosis not present

## 2016-11-03 DIAGNOSIS — R1013 Epigastric pain: Secondary | ICD-10-CM | POA: Insufficient documentation

## 2016-11-03 DIAGNOSIS — K319 Disease of stomach and duodenum, unspecified: Secondary | ICD-10-CM | POA: Diagnosis not present

## 2016-11-03 DIAGNOSIS — I10 Essential (primary) hypertension: Secondary | ICD-10-CM | POA: Insufficient documentation

## 2016-11-03 DIAGNOSIS — Z96652 Presence of left artificial knee joint: Secondary | ICD-10-CM | POA: Diagnosis not present

## 2016-11-03 DIAGNOSIS — K219 Gastro-esophageal reflux disease without esophagitis: Secondary | ICD-10-CM | POA: Diagnosis not present

## 2016-11-03 DIAGNOSIS — Z87891 Personal history of nicotine dependence: Secondary | ICD-10-CM | POA: Diagnosis not present

## 2016-11-03 DIAGNOSIS — Z79899 Other long term (current) drug therapy: Secondary | ICD-10-CM | POA: Diagnosis not present

## 2016-11-03 DIAGNOSIS — K2970 Gastritis, unspecified, without bleeding: Secondary | ICD-10-CM | POA: Diagnosis not present

## 2016-11-03 DIAGNOSIS — I6529 Occlusion and stenosis of unspecified carotid artery: Secondary | ICD-10-CM | POA: Insufficient documentation

## 2016-11-03 DIAGNOSIS — I2510 Atherosclerotic heart disease of native coronary artery without angina pectoris: Secondary | ICD-10-CM | POA: Insufficient documentation

## 2016-11-03 DIAGNOSIS — K228 Other specified diseases of esophagus: Secondary | ICD-10-CM | POA: Diagnosis not present

## 2016-11-03 HISTORY — PX: ESOPHAGOGASTRODUODENOSCOPY: SHX5428

## 2016-11-03 SURGERY — EGD (ESOPHAGOGASTRODUODENOSCOPY)
Anesthesia: Moderate Sedation

## 2016-11-03 MED ORDER — MIDAZOLAM HCL 5 MG/5ML IJ SOLN
INTRAMUSCULAR | Status: AC
  Filled 2016-11-03: qty 10

## 2016-11-03 MED ORDER — SODIUM CHLORIDE 0.9 % IV SOLN
INTRAVENOUS | Status: DC
Start: 2016-11-03 — End: 2016-11-03
  Administered 2016-11-03: 1000 mL via INTRAVENOUS

## 2016-11-03 MED ORDER — MEPERIDINE HCL 50 MG/ML IJ SOLN
INTRAMUSCULAR | Status: DC
Start: 2016-11-03 — End: 2016-11-03
  Filled 2016-11-03: qty 1

## 2016-11-03 MED ORDER — DICYCLOMINE HCL 10 MG PO CAPS: 10.0000 mg | ORAL_CAPSULE | Freq: Three times a day (TID) | ORAL | 0 refills | Status: DC

## 2016-11-03 MED ORDER — MEPERIDINE HCL 100 MG/ML IJ SOLN
INTRAMUSCULAR | Status: AC
  Filled 2016-11-03: qty 1

## 2016-11-03 MED ORDER — MIDAZOLAM HCL 5 MG/5ML IJ SOLN
INTRAMUSCULAR | Status: DC | PRN
  Administered 2016-11-03 (×2): 2 mg via INTRAVENOUS
  Administered 2016-11-03 (×2): 3 mg via INTRAVENOUS

## 2016-11-03 MED ORDER — SIMETHICONE 40 MG/0.6ML PO SUSP
ORAL | Status: DC | PRN
  Administered 2016-11-03: 2.5 mL

## 2016-11-03 MED ORDER — MEPERIDINE HCL 50 MG/ML IJ SOLN
INTRAMUSCULAR | Status: DC | PRN
  Administered 2016-11-03 (×4): 25 mg via INTRAVENOUS

## 2016-11-03 MED ORDER — BUTAMBEN-TETRACAINE-BENZOCAINE 2-2-14 % EX AERO
INHALATION_SPRAY | CUTANEOUS | Status: DC | PRN
  Administered 2016-11-03: 2 via TOPICAL

## 2016-11-03 NOTE — Discharge Instructions (Signed)
Decrease omeprazole to 40 mg by mouth every morning. Discontinue hyoscyamine sublingual. Resume other medications as before. Dicyclomine 10 mg by mouth 30 minutes before each meal. No driving for 24 hours. Physician will call with biopsy results.    Esophagogastroduodenoscopy, Care After Introduction Refer to this sheet in the next few weeks. These instructions provide you with information about caring for yourself after your procedure. Your health care provider may also give you more specific instructions. Your treatment has been planned according to current medical practices, but problems sometimes occur. Call your health care provider if you have any problems or questions after your procedure. What can I expect after the procedure? After the procedure, it is common to have:  A sore throat.  Nausea.  Bloating.  Dizziness.  Fatigue. Follow these instructions at home:  Do not eat or drink anything until the numbing medicine (local anesthetic) has worn off and your gag reflex has returned. You will know that the local anesthetic has worn off when you can swallow comfortably.  Do not drive for 24 hours if you received a medicine to help you relax (sedative).  If your health care provider took a tissue sample for testing during the procedure, make sure to get your test results. This is your responsibility. Ask your health care provider or the department performing the test when your results will be ready.  Keep all follow-up visits as told by your health care provider. This is important. Contact a health care provider if:  You cannot stop coughing.  You are not urinating.  You are urinating less than usual. Get help right away if:  You have trouble swallowing.  You cannot eat or drink.  You have throat or chest pain that gets worse.  You are dizzy or light-headed.  You faint.  You have nausea or vomiting.  You have chills.  You have a fever.  You have severe  abdominal pain.  You have black, tarry, or bloody stools. This information is not intended to replace advice given to you by your health care provider. Make sure you discuss any questions you have with your health care provider. Document Released: 10/30/2012 Document Revised: 04/20/2016 Document Reviewed: 10/07/2015  2017 Elsevier

## 2016-11-03 NOTE — Op Note (Signed)
[facility] Patient Name: [name] Procedure Date: 11/03/2016 2:53 PM MRN: [id number] Date of Birth: [birth year] Attending MD: Hildred Laser , MD CSN: [id number] Age: 60 Admit Type: Outpatient Procedure:                Upper GI endoscopy Indications:              Epigastric abdominal pain unresponsive to therapy                            and workup is negative. Providers:                Hildred Laser, MD, Lurline Del, RN, Purcell N[place]. [place],                            [employer] Referring MD:             Rory Percy, MD Medicines:                Cetacaine spray, Meperidine 100 mg IV, Midazolam 10                            mg IV Complications:            No immediate complications. Estimated Blood Loss:     Estimated blood loss was minimal. Procedure:                Pre-Anesthesia Assessment:                           - Prior to the procedure, a History and Physical                            was performed, and patient medications and                            allergies were reviewed. The patient's tolerance of                            previous anesthesia was also reviewed. The risks                            and benefits of the procedure and the sedation                            options and risks were discussed with the patient.                            All questions were answered, and informed consent                            was obtained. Prior Anticoagulants: The patient                            last took aspirin 1 day prior to the procedure. ASA  Grade Assessment: III - A patient with severe                            systemic disease. After reviewing the risks and                            benefits, the patient was deemed in satisfactory                            condition to undergo the procedure.                           After obtaining informed consent, the endoscope was                            passed under direct vision.  Throughout the                            procedure, the patient's blood pressure, pulse, and                            oxygen saturations were monitored continuously. The                            [id number]) scope was introduced through the                            mouth, and advanced to the second part of duodenum.                            The upper GI endoscopy was accomplished without                            difficulty. The patient tolerated the procedure                            well. Scope In: 3:14:25 PM Scope Out: 3:25:28 PM Total Procedure Duration: 0 hours 11 minutes 3 seconds  Findings:      The examined esophagus was normal.      The Z-line was irregular and was found 31 cm from the incisors.      A 4 cm hiatal hernia was present.      Patchy mild inflammation characterized by congestion (edema), erythema       and friability was found in the gastric antrum. Biopsies were taken with       a cold forceps for histology.      The exam of the stomach was otherwise normal.      The second portion of the duodenum was normal. Biopsies were taken with       a cold forceps for histology. Impression:               - Normal esophagus.                           - Z-line irregular, 31 cm from the incisors.                           -  4 cm hiatal hernia.                           - Gastritis. Biopsied.                           - Normal second portion of the duodenum. Biopsied. Moderate Sedation:      Moderate (conscious) sedation was administered by the endoscopy nurse       and supervised by the endoscopist. The following parameters were       monitored: oxygen saturation, heart rate, blood pressure, CO2       capnography and response to care. Total physician intraservice time was       18 minutes. Recommendation:           - Patient has a contact number available for                            emergencies. The signs and symptoms of potential                             delayed complications were discussed with the                            patient. Return to normal activities tomorrow.                            Written discharge instructions were provided to the                            patient.                           - Resume previous diet today.                           - Continue present medications.                           - Decrease omeprazole touth every morning.                           - Dicyclomine 10 mg by mouth 30 minutes before each                            meal.                           - Await pathology results. Procedure Code(s):        --- Professional ---                           [phone], Esophagogastroduodenoscopy, flexible,                            transoral; with biopsy, single or multiple  [id number], Moderate sedation services provided by the                            same physician or other qualified health care                            professional performing the diagnostic or                            therapeutic service that the sedation supports,                            requiring the presence of an independent trained                            observer to assist in the monitoring of the                            patient's level of consciousness and physiological                            status; initial 15 minutes of intraservice time,                            patient age 54 years or older Diagnosis Code(s):        --- Professional ---                           K22.8, Other specified diseases of esophagus                           K44.9, Diaphragmatic hernia without obstruction or                            gangrene                           K29.70, Gastritis, unspecified, without bleeding                           R10.13, Epigastric pain CPT copyright 2016 [employer]. All rights reserved. The codes documented in this report are preliminary and upon coder  review may  be revised to meet current compliance requirements. Hildred Laser, MD Hildred Laser, MD 11/03/2016 3:37:12 PM This report has been signed electronically. Number of Addenda: 0

## 2016-11-03 NOTE — H&P (Signed)
[name] is an 60 y.o. female.   Chief Complaint: Patient is here for EGD. HPI: Issues 60 year old Caucasian female with multiple medical problems presents with chronic epigastric pain which started back in May when she had gastroenteritis or food poisoning. She has not experienced nausea or vomiting. At times pain is worse with meals and other times it makes no difference. She denies melena or rectal bleeding. Her appetite is fair. She states she lost 30 pounds voluntarily but it has all come back. He is on low-dose aspirin but does not take OTC NSAIDs. She does not smoke cigarettes or drink alcohol. Patient is on omeprazole for chronic GERD. Prior workup includes normal CBC with differential comprehensive chemistry panel as well as abdominopelvic CT as and MR abdomen.  Past Medical History:  Diagnosis Date  . Anxiety   . CAD (coronary artery disease)    PCI Stent RCA 03/12/11.  Cath 03/22/11 LVEF 55-60%.  RCA patent  proximal/distal stents with 30% proximal disease and 20% mid stenosis.  LCX with 30%  mid stenosis.  Questionable spasm.  . Carotid stenosis    Less than 50% 8/11  . GERD (gastroesophageal reflux disease) 03/02/2014  . H/O hiatal hernia   . HTN (hypertension)   . Hyperlipidemia   . Myocardial infarction   . Renal disorder    kidney stone    Past Surgical History:  Procedure Laterality Date  . CARDIAC SURGERY     stent placement  . CHOLECYSTECTOMY    . COLONOSCOPY N/A 04/01/2014   Procedure: COLONOSCOPY;  Surgeon: Rogene Houston, MD;  [facility];  Service: Endoscopy;  Laterality: N/A;  100  . Cyst removed from spine    . ECTOPIC PREGNANCY SURGERY    . ESOPHAGOGASTRODUODENOSCOPY N/A 04/01/2014   Procedure: ESOPHAGOGASTRODUODENOSCOPY (EGD);  Surgeon: Rogene Houston, MD;  [facility];  Service: Endoscopy;  Laterality: N/A;  100  . Heart stent     2012  x 2 stents  . Left knee     Arthroscopic  . Left wrist    . TONSILLECTOMY AND ADENOIDECTOMY     . TOTAL KNEE ARTHROPLASTY Left 09/26/2013   Procedure: TOTAL KNEE ARTHROPLASTY- LEFT;  Surgeon: [name]., MD;  Location: [place];  Service: Orthopedics;  Laterality: Left;  . TUBAL LIGATION      Family History  Problem Relation Age of Onset  . Colon cancer Father   . Heart disease    . Arthritis    . Cancer     Social History:  reports that she quit smoking about 5 years ago. Her smoking use included Cigarettes. She started smoking about 37 years ago. She has a 10.00 pack-year smoking history. She has never used smokeless tobacco. She reports that she drinks alcohol. She reports that she does not use drugs.  Allergies:  Allergies  Allergen Reactions  . Ancef [Cefazolin] Itching    Itching around mouth - sx resolved quickly after administration of benadryl; with no further sx  . Ciprofloxacin Hcl     unknown  . Metaxalone     unknown  . Sulfa Antibiotics Itching, Rash and Other (See Comments)    "severe muscle cramps, tongue cracked, dry mouth "    Medications Prior to Admission  Medication Sig Dispense Refill  . acetaminophen (TYLENOL) 325 MG tablet Take 2 tablets (650 mg total) by mouth every 6 (six) hours as needed. 60 tablet 0  . Ascorbic Acid (VITAMIN C) 1000 MG  tablet Take 1,000 mg by mouth at bedtime.     . benzonatate (TESSALON) 100 MG capsule Take 1 capsule (100 mg total) by mouth 3 (three) times daily as needed for cough. 21 capsule 0  . cetirizine (ZYRTEC ALLERGY) 10 MG tablet Take 1 tablet (10 mg total) by mouth daily. 30 tablet 1  . diltiazem (CARDIZEM CD) 120 MG 24 hr capsule Take 1 capsule (120 mg total) by mouth daily. 90 capsule 3  . DULoxetine (CYMBALTA) 60 MG capsule Take 60 mg by mouth every morning.  3  . Evolocumab (REPATHA SURECLICK) [id number] MG/ML SOAJ Inject 140 mg into the skin every 14 (fourteen) days. 28 pen 11  . fluticasone (FLONASE) 50 MCG/ACT nasal spray Place 2 sprays into both nostrils daily. 16 g 0  . furosemide (LASIX) 40 MG tablet Take 1  tablet by mouth daily.  12  . hydrocortisone (PROCTOSOL HC) 2.5 % rectal cream APPLY RECTALLY TWICE A DAY AS DIRECTED (Patient taking differently: APPLY RECTALLY TWICE A DAY AS DIRECTED AS NEEDED FOR HEMORRHOIDS) 28.35 g 2  . isosorbide mononitrate (IMDUR) 30 MG 24 hr tablet Take 1 tablet (30 mg total) by mouth daily. 90 tablet 3  . lisinopril-hydrochlorothiazide (PRINZIDE,ZESTORETIC) 10-12.5 MG tablet Take 1 tablet by mouth daily.  3  . magnesium oxide (MAG-OX) 400 MG tablet Take 400 mg by mouth daily.    . Melatonin 5 MG TABS Take 5 mg by mouth at bedtime.    . Multiple Vitamin (MULTIVITAMIN) tablet Take 1 tablet by mouth daily.    Marland Kitchen omeprazole (PRILOSEC) 40 MG capsule Take 1 capsule (40 mg total) by mouth 2 (two) times daily. 60 capsule 3  . Probiotic Product (PROBIOTIC DAILY PO) Take 1 tablet by mouth daily.    . traMADol (ULTRAM) 50 MG tablet Take 100 mg by mouth 3 (three) times daily.     Marland Kitchen zolpidem (AMBIEN) 10 MG tablet Take 10 mg by mouth at bedtime.   3  . aspirin 81 MG tablet Take 81 mg by mouth daily.    Marland Kitchen EPINEPHrine 0.3 mg/0.3 mL IJ SOAJ injection Self inject per package instructions as needed for severe allergic reaction and seek medical attention. 2 Device 0  . HYDROcodone-acetaminophen (NORCO/VICODIN) 5-325 MG tablet Take 1 tablet by mouth every 4 (four) hours as needed. (Patient not taking: Reported on 11/02/2016) 20 tablet 0  . hyoscyamine (LEVSIN/SL) 0.125 MG SL tablet Place 2 tablets (0.25 mg total) under the tongue every 6 (six) hours as needed (abdominal pain). (Patient not taking: Reported on 11/02/2016) 30 tablet 0  . Naphazoline-Pheniramine 0.027-0.315 % SOLN Apply 1 drop to eye 3 (three) times daily as needed (FOR ITCHY EYE/IRRITATION).     Marland Kitchen nitroGLYCERIN (NITROSTAT) 0.4 MG SL tablet Place 1 tablet (0.4 mg total) under the tongue every 5 (five) minutes as needed for chest pain. 25 tablet 3  . pantoprazole (PROTONIX) 40 MG tablet Take 1 tablet (40 mg total) by mouth 2 (two)  times daily before a meal. 60 tablet 2    No results found for this or any previous visit (from the past 48 hour(s)). No results found.  ROS  Blood pressure 137/85, pulse 75, temperature 98.5 F (36.9 C), temperature source Oral, resp. rate 17, height 5\' 2"  (1.575 m), weight 179 lb (81.2 kg), SpO2 97 %. Physical Exam  Constitutional: She appears well-developed and well-nourished.  HENT:  Mouth/Throat: Oropharynx is clear and moist.  Eyes: Conjunctivae are normal. No scleral icterus.  Neck: No thyromegaly  present.  Cardiovascular: Normal rate, regular rhythm and normal heart sounds.   No murmur heard. Respiratory: Effort normal and breath sounds normal.  GI:  Abdomen is full. It is soft with mild to moderate midepigastric tenderness on deep palpation. No organomegaly or masses. Mild tenderness also noted below the right costal margin.  Musculoskeletal: She exhibits no edema.  Lymphadenopathy:    She has no cervical adenopathy.  Neurological: She is alert.  Skin: Skin is warm and dry.     Assessment/Plan Chronic epigastric pain unresponsive to therapy. Diagnostic EGD.  Hildred Laser, MD 11/03/2016, 2:56 PM

## 2016-11-08 ENCOUNTER — Encounter (HOSPITAL_COMMUNITY): Payer: Self-pay | Admitting: Internal Medicine

## 2016-11-15 ENCOUNTER — Other Ambulatory Visit (INDEPENDENT_AMBULATORY_CARE_PROVIDER_SITE_OTHER): Payer: Self-pay | Admitting: Internal Medicine

## 2016-11-15 DIAGNOSIS — K219 Gastro-esophageal reflux disease without esophagitis: Secondary | ICD-10-CM

## 2016-11-29 ENCOUNTER — Other Ambulatory Visit: Payer: Self-pay | Admitting: *Deleted

## 2016-11-29 ENCOUNTER — Encounter: Payer: Self-pay | Admitting: *Deleted

## 2016-11-29 DIAGNOSIS — E7849 Other hyperlipidemia: Secondary | ICD-10-CM

## 2016-11-29 DIAGNOSIS — Z83438 Family history of other disorder of lipoprotein metabolism and other lipidemia: Secondary | ICD-10-CM

## 2016-12-05 DIAGNOSIS — M5441 Lumbago with sciatica, right side: Secondary | ICD-10-CM | POA: Diagnosis not present

## 2016-12-05 DIAGNOSIS — M5432 Sciatica, left side: Secondary | ICD-10-CM | POA: Diagnosis not present

## 2016-12-05 DIAGNOSIS — Z79891 Long term (current) use of opiate analgesic: Secondary | ICD-10-CM | POA: Diagnosis not present

## 2016-12-05 DIAGNOSIS — G894 Chronic pain syndrome: Secondary | ICD-10-CM | POA: Diagnosis not present

## 2016-12-05 DIAGNOSIS — M5442 Lumbago with sciatica, left side: Secondary | ICD-10-CM | POA: Diagnosis not present

## 2016-12-07 ENCOUNTER — Telehealth (INDEPENDENT_AMBULATORY_CARE_PROVIDER_SITE_OTHER): Payer: Self-pay | Admitting: Internal Medicine

## 2016-12-07 NOTE — Telephone Encounter (Signed)
Patient called and stated that she was supposed to get back with Dr. Laural Golden about the medication he prescribed, she cant tell its doing a whole lot of good, but shes taking it.  All maintenance medication has to now to go to [facility] in [place] along with the 2% cream that Terri prescribed.  Shed like to know what she needs to do to get this changed [facility] [place], her insurance no longer works with [facility]  [phone]

## 2016-12-08 DIAGNOSIS — E7800 Pure hypercholesterolemia, unspecified: Secondary | ICD-10-CM | POA: Diagnosis not present

## 2016-12-08 NOTE — Telephone Encounter (Signed)
Patient's pharmacy was changed in the computer and Dr.Rehman will be made aware of her progress report.

## 2016-12-19 DIAGNOSIS — M5432 Sciatica, left side: Secondary | ICD-10-CM | POA: Diagnosis not present

## 2016-12-19 DIAGNOSIS — M5441 Lumbago with sciatica, right side: Secondary | ICD-10-CM | POA: Diagnosis not present

## 2016-12-19 DIAGNOSIS — G894 Chronic pain syndrome: Secondary | ICD-10-CM | POA: Diagnosis not present

## 2016-12-19 DIAGNOSIS — Z79891 Long term (current) use of opiate analgesic: Secondary | ICD-10-CM | POA: Diagnosis not present

## 2016-12-19 DIAGNOSIS — M5442 Lumbago with sciatica, left side: Secondary | ICD-10-CM | POA: Diagnosis not present

## 2016-12-20 ENCOUNTER — Telehealth: Payer: Self-pay | Admitting: Cardiovascular Disease

## 2016-12-20 DIAGNOSIS — F330 Major depressive disorder, recurrent, mild: Secondary | ICD-10-CM | POA: Diagnosis not present

## 2016-12-20 DIAGNOSIS — I10 Essential (primary) hypertension: Secondary | ICD-10-CM | POA: Diagnosis not present

## 2016-12-20 DIAGNOSIS — E7800 Pure hypercholesterolemia, unspecified: Secondary | ICD-10-CM | POA: Diagnosis not present

## 2016-12-20 DIAGNOSIS — F419 Anxiety disorder, unspecified: Secondary | ICD-10-CM | POA: Diagnosis not present

## 2016-12-20 MED ORDER — DILTIAZEM HCL ER COATED BEADS 120 MG PO CP24: 120.0000 mg | ORAL_CAPSULE | Freq: Every day | ORAL | 3 refills | Status: DC

## 2016-12-20 MED ORDER — FUROSEMIDE 40 MG PO TABS: 40.0000 mg | ORAL_TABLET | Freq: Every day | ORAL | 3 refills | Status: DC

## 2016-12-20 MED ORDER — ISOSORBIDE MONONITRATE ER 30 MG PO TB24: 30.0000 mg | ORAL_TABLET | Freq: Every day | ORAL | 3 refills | Status: DC

## 2016-12-20 MED ORDER — LISINOPRIL-HYDROCHLOROTHIAZIDE 10-12.5 MG PO TABS: 1.0000 | ORAL_TABLET | Freq: Every day | ORAL | 3 refills | Status: DC

## 2016-12-20 NOTE — Telephone Encounter (Signed)
Medication sent to pharmacy  

## 2016-12-20 NOTE — Telephone Encounter (Signed)
Cardiac medication please send to [facility] in [place]  Asking for 90 day prescriptions

## 2016-12-21 DIAGNOSIS — M546 Pain in thoracic spine: Secondary | ICD-10-CM | POA: Diagnosis not present

## 2016-12-21 DIAGNOSIS — M5489 Other dorsalgia: Secondary | ICD-10-CM | POA: Diagnosis not present

## 2016-12-26 ENCOUNTER — Telehealth: Payer: Self-pay | Admitting: *Deleted

## 2016-12-26 NOTE — Telephone Encounter (Signed)
Notes Recorded by [name], LPN on [id number] at [id number] PM EST Patient notified. Stated pmd had already went over results with her. ------  Notes Recorded by Herminio Commons, MD on 12/21/2016 at 8:36 AM EST [name].

## 2017-01-02 DIAGNOSIS — F99 Mental disorder, not otherwise specified: Secondary | ICD-10-CM | POA: Diagnosis not present

## 2017-01-16 DIAGNOSIS — M25561 Pain in right knee: Secondary | ICD-10-CM | POA: Diagnosis not present

## 2017-01-16 DIAGNOSIS — M545 Low back pain: Secondary | ICD-10-CM | POA: Diagnosis not present

## 2017-01-16 DIAGNOSIS — Z79891 Long term (current) use of opiate analgesic: Secondary | ICD-10-CM | POA: Diagnosis not present

## 2017-01-16 DIAGNOSIS — G894 Chronic pain syndrome: Secondary | ICD-10-CM | POA: Diagnosis not present

## 2017-01-16 DIAGNOSIS — M25562 Pain in left knee: Secondary | ICD-10-CM | POA: Diagnosis not present

## 2017-02-06 ENCOUNTER — Telehealth: Payer: Self-pay | Admitting: *Deleted

## 2017-02-06 NOTE — Telephone Encounter (Signed)
Received fax from [facility] [place] 140mg /ml [employer] approved 04/01/37 to 01/30/2018.

## 2017-02-13 DIAGNOSIS — Z79891 Long term (current) use of opiate analgesic: Secondary | ICD-10-CM | POA: Diagnosis not present

## 2017-02-13 DIAGNOSIS — G894 Chronic pain syndrome: Secondary | ICD-10-CM | POA: Diagnosis not present

## 2017-02-13 DIAGNOSIS — M25562 Pain in left knee: Secondary | ICD-10-CM | POA: Diagnosis not present

## 2017-02-13 DIAGNOSIS — M545 Low back pain: Secondary | ICD-10-CM | POA: Diagnosis not present

## 2017-02-13 DIAGNOSIS — M25561 Pain in right knee: Secondary | ICD-10-CM | POA: Diagnosis not present

## 2017-03-13 DIAGNOSIS — M545 Low back pain: Secondary | ICD-10-CM | POA: Diagnosis not present

## 2017-03-13 DIAGNOSIS — M25562 Pain in left knee: Secondary | ICD-10-CM | POA: Diagnosis not present

## 2017-03-13 DIAGNOSIS — M25561 Pain in right knee: Secondary | ICD-10-CM | POA: Diagnosis not present

## 2017-03-13 DIAGNOSIS — G894 Chronic pain syndrome: Secondary | ICD-10-CM | POA: Diagnosis not present

## 2017-03-15 ENCOUNTER — Telehealth (INDEPENDENT_AMBULATORY_CARE_PROVIDER_SITE_OTHER): Payer: Self-pay | Admitting: Internal Medicine

## 2017-03-15 NOTE — Telephone Encounter (Signed)
Patient called, would like for Terri to call her, she has some questions about medication  [phone]

## 2017-03-15 NOTE — Telephone Encounter (Signed)
Needs to try Gas X

## 2017-03-29 ENCOUNTER — Telehealth (INDEPENDENT_AMBULATORY_CARE_PROVIDER_SITE_OTHER): Payer: Self-pay | Admitting: Internal Medicine

## 2017-03-29 NOTE — Telephone Encounter (Signed)
[name], this patient is on the recall for this month, but looking at her notes, I'm not sure if she needs it or not.  Will you please review and advise?

## 2017-03-29 NOTE — Telephone Encounter (Signed)
If she is not having any problems, recall 6 months.

## 2017-03-29 NOTE — Telephone Encounter (Signed)
If she is not having any problems, recall 6 months

## 2017-04-02 NOTE — Telephone Encounter (Signed)
I called the patient, she said six months is fine, scheduled her for 10/03/17 at 11:30am.

## 2017-04-07 ENCOUNTER — Other Ambulatory Visit (INDEPENDENT_AMBULATORY_CARE_PROVIDER_SITE_OTHER): Payer: Self-pay | Admitting: Internal Medicine

## 2017-04-09 DIAGNOSIS — H40053 Ocular hypertension, bilateral: Secondary | ICD-10-CM | POA: Diagnosis not present

## 2017-04-10 DIAGNOSIS — M25562 Pain in left knee: Secondary | ICD-10-CM | POA: Diagnosis not present

## 2017-04-10 DIAGNOSIS — M545 Low back pain: Secondary | ICD-10-CM | POA: Diagnosis not present

## 2017-04-10 DIAGNOSIS — M25561 Pain in right knee: Secondary | ICD-10-CM | POA: Diagnosis not present

## 2017-04-10 DIAGNOSIS — G894 Chronic pain syndrome: Secondary | ICD-10-CM | POA: Diagnosis not present

## 2017-04-18 DIAGNOSIS — F419 Anxiety disorder, unspecified: Secondary | ICD-10-CM | POA: Diagnosis not present

## 2017-04-18 DIAGNOSIS — K219 Gastro-esophageal reflux disease without esophagitis: Secondary | ICD-10-CM | POA: Diagnosis not present

## 2017-04-18 DIAGNOSIS — I10 Essential (primary) hypertension: Secondary | ICD-10-CM | POA: Diagnosis not present

## 2017-04-18 DIAGNOSIS — F330 Major depressive disorder, recurrent, mild: Secondary | ICD-10-CM | POA: Diagnosis not present

## 2017-05-04 DIAGNOSIS — M25561 Pain in right knee: Secondary | ICD-10-CM | POA: Diagnosis not present

## 2017-05-04 DIAGNOSIS — M545 Low back pain: Secondary | ICD-10-CM | POA: Diagnosis not present

## 2017-05-04 DIAGNOSIS — M25562 Pain in left knee: Secondary | ICD-10-CM | POA: Diagnosis not present

## 2017-05-04 DIAGNOSIS — G894 Chronic pain syndrome: Secondary | ICD-10-CM | POA: Diagnosis not present

## 2017-05-24 ENCOUNTER — Other Ambulatory Visit: Payer: Self-pay | Admitting: *Deleted

## 2017-05-24 MED ORDER — EVOLOCUMAB 140 MG/ML ~~LOC~~ SOAJ: 140.0000 mg | SUBCUTANEOUS | 3 refills | Status: DC

## 2017-06-06 DIAGNOSIS — M25561 Pain in right knee: Secondary | ICD-10-CM | POA: Diagnosis not present

## 2017-06-06 DIAGNOSIS — M25562 Pain in left knee: Secondary | ICD-10-CM | POA: Diagnosis not present

## 2017-06-06 DIAGNOSIS — M545 Low back pain: Secondary | ICD-10-CM | POA: Diagnosis not present

## 2017-06-06 DIAGNOSIS — G894 Chronic pain syndrome: Secondary | ICD-10-CM | POA: Diagnosis not present

## 2017-06-12 DIAGNOSIS — B351 Tinea unguium: Secondary | ICD-10-CM | POA: Diagnosis not present

## 2017-06-12 DIAGNOSIS — Z85828 Personal history of other malignant neoplasm of skin: Secondary | ICD-10-CM | POA: Diagnosis not present

## 2017-06-12 DIAGNOSIS — L570 Actinic keratosis: Secondary | ICD-10-CM | POA: Diagnosis not present

## 2017-06-12 DIAGNOSIS — D485 Neoplasm of uncertain behavior of skin: Secondary | ICD-10-CM | POA: Diagnosis not present

## 2017-07-01 ENCOUNTER — Encounter (HOSPITAL_COMMUNITY): Payer: Self-pay | Admitting: Emergency Medicine

## 2017-07-01 ENCOUNTER — Emergency Department (HOSPITAL_COMMUNITY): Payer: BLUE CROSS/BLUE SHIELD

## 2017-07-01 ENCOUNTER — Observation Stay (HOSPITAL_COMMUNITY)
Admission: EM | Admit: 2017-07-01 | Discharge: 2017-07-03 | Disposition: A | Discharge status: Home / Self Care | Payer: BLUE CROSS/BLUE SHIELD | Attending: Cardiovascular Disease | Admitting: Cardiovascular Disease

## 2017-07-01 DIAGNOSIS — Z882 Allergy status to sulfonamides status: Secondary | ICD-10-CM | POA: Insufficient documentation

## 2017-07-01 DIAGNOSIS — I10 Essential (primary) hypertension: Secondary | ICD-10-CM | POA: Diagnosis not present

## 2017-07-01 DIAGNOSIS — I252 Old myocardial infarction: Secondary | ICD-10-CM | POA: Diagnosis not present

## 2017-07-01 DIAGNOSIS — Z79899 Other long term (current) drug therapy: Secondary | ICD-10-CM | POA: Diagnosis not present

## 2017-07-01 DIAGNOSIS — Z880 Allergy status to penicillin: Secondary | ICD-10-CM | POA: Diagnosis not present

## 2017-07-01 DIAGNOSIS — E669 Obesity, unspecified: Secondary | ICD-10-CM | POA: Diagnosis not present

## 2017-07-01 DIAGNOSIS — Z6832 Body mass index (BMI) 32.0-32.9, adult: Secondary | ICD-10-CM | POA: Insufficient documentation

## 2017-07-01 DIAGNOSIS — G894 Chronic pain syndrome: Secondary | ICD-10-CM | POA: Diagnosis not present

## 2017-07-01 DIAGNOSIS — Z7982 Long term (current) use of aspirin: Secondary | ICD-10-CM | POA: Insufficient documentation

## 2017-07-01 DIAGNOSIS — Z87891 Personal history of nicotine dependence: Secondary | ICD-10-CM | POA: Insufficient documentation

## 2017-07-01 DIAGNOSIS — R0789 Other chest pain: Secondary | ICD-10-CM | POA: Diagnosis not present

## 2017-07-01 DIAGNOSIS — Z96652 Presence of left artificial knee joint: Secondary | ICD-10-CM | POA: Diagnosis not present

## 2017-07-01 DIAGNOSIS — K219 Gastro-esophageal reflux disease without esophagitis: Secondary | ICD-10-CM | POA: Diagnosis present

## 2017-07-01 DIAGNOSIS — I25110 Atherosclerotic heart disease of native coronary artery with unstable angina pectoris: Principal | ICD-10-CM | POA: Insufficient documentation

## 2017-07-01 DIAGNOSIS — R079 Chest pain, unspecified: Secondary | ICD-10-CM | POA: Diagnosis not present

## 2017-07-01 DIAGNOSIS — I2510 Atherosclerotic heart disease of native coronary artery without angina pectoris: Secondary | ICD-10-CM | POA: Diagnosis present

## 2017-07-01 DIAGNOSIS — F329 Major depressive disorder, single episode, unspecified: Secondary | ICD-10-CM | POA: Insufficient documentation

## 2017-07-01 DIAGNOSIS — E785 Hyperlipidemia, unspecified: Secondary | ICD-10-CM | POA: Diagnosis present

## 2017-07-01 DIAGNOSIS — I2582 Chronic total occlusion of coronary artery: Secondary | ICD-10-CM | POA: Diagnosis not present

## 2017-07-01 DIAGNOSIS — Z955 Presence of coronary angioplasty implant and graft: Secondary | ICD-10-CM | POA: Insufficient documentation

## 2017-07-01 HISTORY — DX: Chronic total occlusion of coronary artery: I25.82

## 2017-07-01 LAB — BASIC METABOLIC PANEL
ANION GAP: 10 (ref 5–15)
BUN: 15 mg/dL (ref 6–20)
CHLORIDE: 99 mmol/L — AB (ref 101–111)
CO2: 28 mmol/L (ref 22–32)
Calcium: 9.5 mg/dL (ref 8.9–10.3)
Creatinine, Ser: 0.83 mg/dL (ref 0.44–1.00)
GFR calc non Af Amer: >60 mL/min (ref >60)
Glucose, Bld: 107 mg/dL — ABNORMAL HIGH (ref 65–99)
POTASSIUM: 3.2 mmol/L — AB (ref 3.5–5.1)
Sodium: 137 mmol/L (ref 135–145)

## 2017-07-01 LAB — CBC
HEMATOCRIT: 43 % (ref 36.0–46.0)
HEMOGLOBIN: 14.6 g/dL (ref 12.0–15.0)
MCH: 30 pg (ref 26.0–34.0)
MCHC: 34 g/dL (ref 30.0–36.0)
MCV: 88.3 fL (ref 78.0–100.0)
Platelets: 346 10*3/uL (ref 150–400)
RBC: 4.87 MIL/uL (ref 3.87–5.11)
RDW: 13.2 % (ref 11.5–15.5)
WBC: 6.5 10*3/uL (ref 4.0–10.5)

## 2017-07-01 LAB — TROPONIN I: Troponin I: <0.03 ng/mL (ref <0.03)

## 2017-07-01 LAB — D-DIMER, QUANTITATIVE: D-Dimer, Quant: 0.99 ug/mL-FEU — ABNORMAL HIGH (ref 0.00–0.50)

## 2017-07-01 MED ORDER — EVOLOCUMAB 140 MG/ML ~~LOC~~ SOAJ: 140.0000 mg | SUBCUTANEOUS | Status: DC

## 2017-07-01 MED ORDER — DOCUSATE SODIUM 100 MG PO CAPS
100.0000 mg | ORAL_CAPSULE | Freq: Two times a day (BID) | ORAL | Status: DC
  Administered 2017-07-01 – 2017-07-03 (×4): 100 mg via ORAL
  Filled 2017-07-01 (×4): qty 1

## 2017-07-01 MED ORDER — HEPARIN BOLUS VIA INFUSION
3000.0000 [IU] | Freq: Once | INTRAVENOUS | Status: AC
  Administered 2017-07-01: 3000 [IU] via INTRAVENOUS

## 2017-07-01 MED ORDER — ISOSORBIDE MONONITRATE ER 60 MG PO TB24
60.0000 mg | ORAL_TABLET | Freq: Every day | ORAL | Status: DC
  Administered 2017-07-01 – 2017-07-03 (×3): 60 mg via ORAL
  Filled 2017-07-01 (×3): qty 1

## 2017-07-01 MED ORDER — ASPIRIN 81 MG PO CHEW
81.0000 mg | CHEWABLE_TABLET | Freq: Every day | ORAL | Status: DC
  Administered 2017-07-02: 81 mg via ORAL
  Filled 2017-07-01: qty 1

## 2017-07-01 MED ORDER — DILTIAZEM HCL ER COATED BEADS 120 MG PO CP24
120.0000 mg | ORAL_CAPSULE | Freq: Every day | ORAL | Status: DC
  Administered 2017-07-02: 120 mg via ORAL
  Filled 2017-07-01 (×2): qty 1

## 2017-07-01 MED ORDER — MORPHINE SULFATE (PF) 2 MG/ML IV SOLN: 2.0000 mg | INTRAVENOUS | Status: DC | PRN

## 2017-07-01 MED ORDER — MELATONIN 5 MG PO TABS: 5.0000 mg | ORAL_TABLET | Freq: Every day | ORAL | Status: DC

## 2017-07-01 MED ORDER — ZOLPIDEM TARTRATE 5 MG PO TABS
5.0000 mg | ORAL_TABLET | Freq: Every day | ORAL | Status: DC
  Administered 2017-07-01 – 2017-07-02 (×2): 5 mg via ORAL
  Filled 2017-07-01 (×2): qty 1

## 2017-07-01 MED ORDER — ONDANSETRON HCL 4 MG PO TABS: 4.0000 mg | ORAL_TABLET | Freq: Four times a day (QID) | ORAL | Status: DC | PRN

## 2017-07-01 MED ORDER — FLORA-Q PO CAPS
ORAL_CAPSULE | Freq: Every day | ORAL | Status: DC
  Filled 2017-07-01 (×2): qty 1

## 2017-07-01 MED ORDER — ONDANSETRON HCL 4 MG/2ML IJ SOLN: 4.0000 mg | Freq: Four times a day (QID) | INTRAMUSCULAR | Status: DC | PRN

## 2017-07-01 MED ORDER — HEPARIN (PORCINE) IN NACL 100-0.45 UNIT/ML-% IJ SOLN
1000.0000 [IU]/h | INTRAMUSCULAR | Status: DC
  Administered 2017-07-01: 1100 [IU]/h via INTRAVENOUS
  Administered 2017-07-02: 1000 [IU]/h via INTRAVENOUS
  Filled 2017-07-01 (×2): qty 250

## 2017-07-01 MED ORDER — PANTOPRAZOLE SODIUM 40 MG PO TBEC
40.0000 mg | DELAYED_RELEASE_TABLET | Freq: Every day | ORAL | Status: DC
  Administered 2017-07-02 – 2017-07-03 (×2): 40 mg via ORAL
  Filled 2017-07-01 (×2): qty 1

## 2017-07-01 MED ORDER — HYDROCODONE-ACETAMINOPHEN 10-325 MG PO TABS
1.0000 | ORAL_TABLET | Freq: Three times a day (TID) | ORAL | Status: DC | PRN
  Administered 2017-07-01 – 2017-07-03 (×3): 1 via ORAL
  Filled 2017-07-01 (×3): qty 1

## 2017-07-01 MED ORDER — IOPAMIDOL (ISOVUE-370) INJECTION 76%
100.0000 mL | Freq: Once | INTRAVENOUS | Status: AC | PRN
  Administered 2017-07-01: 100 mL via INTRAVENOUS

## 2017-07-01 MED ORDER — DULOXETINE HCL 30 MG PO CPEP
30.0000 mg | ORAL_CAPSULE | Freq: Every day | ORAL | Status: DC
  Administered 2017-07-02 – 2017-07-03 (×2): 30 mg via ORAL
  Filled 2017-07-01 (×3): qty 1

## 2017-07-01 NOTE — ED Notes (Addendum)
EKG given to Dr. Mesner. 

## 2017-07-01 NOTE — H&P (Signed)
History and Physical    [name] VZD:[id number] DOB: July 04, 1956 DOA: 07/01/2017  PCP: Rory Percy, MD  Patient coming from: Home.    Chief Complaint:  Chest pain today.   HPI: [name] is an 61 y.o. female with hx of known CAD, s/p 2 DES stent in 2012 after an inferior MI, hx of HLD, intolerant of statin, currently on PSK9 injection Q2 weeks, hx of GERD on PPI, HTN, obesity, presented to the ER with rather severe substernal chest pain, toward the right, under the right breast, with right jaw and right shoulder pain, some diaphoresis, but no nausea or vomiting.  Her pain lasted about 6 minutes, and relieved promptly with SL NTG.  She said after the cath in 2012, she hadn't required the use of NTG but the last 6 months, she had to use it once.  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  Work up in the ER showed normal renal Fx tests, negative initial troponin, and EKG showed subtle changes in ST T waves in the inferiro leads, along with new Q wave in III.  D dimer was positive at 0.99 culminated in a pulmonary CTA which was unremarkable.  She had EGD done by Dr Dereck Leep at the end of last year (Dec 2017) which only showed a HH, and changes of GERD.  Hospitalist was asked to admit her for chest pain.   ED Course:  See above.  Rewiew of Systems:  Constitutional: Negative for malaise, fever and chills. No significant weight loss or weight gain Eyes: Negative for eye pain, redness and discharge, diplopia, visual changes, or flashes of light. ENMT: Negative for ear pain, hoarseness, nasal congestion, sinus pressure and sore throat. No headaches; tinnitus, drooling, or problem swallowing. Cardiovascular: Negative for chest pain, palpitations, diaphoresis, dyspnea and peripheral edema. ; No orthopnea, PND Respiratory: Negative for cough, hemoptysis, wheezing and stridor. No pleuritic chestpain. Gastrointestinal: Negative for  diarrhea, constipation,  melena, blood in stool, hematemesis, jaundice and rectal bleeding.    Genitourinary: Negative for frequency, dysuria, incontinence,flank pain and hematuria; Musculoskeletal: Negative for back pain and neck pain. Negative for swelling and trauma.;  Skin: . Negative for pruritus, rash, abrasions, bruising and skin lesion.; ulcerations Neuro: Negative for headache, lightheadedness and neck stiffness. Negative for weakness, altered level of consciousness , altered mental status, extremity weakness, burning feet, involuntary movement, seizure and syncope.  Psych: negative for anxiety, depression, insomnia, tearfulness, panic attacks, hallucinations, paranoia, suicidal or homicidal ideation    Past Medical History:  Diagnosis Date  . Anxiety   . CAD (coronary artery disease)    PCI Stent RCA 03/12/11.  Cath 03/22/11 LVEF 55-60%.  RCA patent  proximal/distal stents with 30% proximal disease and 20% mid stenosis.  LCX with 30%  mid stenosis.  Questionable spasm.  . Carotid stenosis    Less than 50% 8/11  . GERD (gastroesophageal reflux disease) 03/02/2014  . H/O hiatal hernia   . HTN (hypertension)   . Hyperlipidemia   . Myocardial infarction ([place])   . Renal disorder    kidney stone   Past Surgical History:  Procedure Laterality Date  . CARDIAC SURGERY     stent placement  . CHOLECYSTECTOMY    . COLONOSCOPY N/A 04/01/2014   Procedure: COLONOSCOPY;  Surgeon: Rogene Houston, MD;  [facility];  Service: Endoscopy;  Laterality: N/A;  100  . Cyst removed from spine    .  ECTOPIC PREGNANCY SURGERY    . ESOPHAGOGASTRODUODENOSCOPY N/A 04/01/2014   Procedure: ESOPHAGOGASTRODUODENOSCOPY (EGD);  Surgeon: Rogene Houston, MD;  [facility];  Service: Endoscopy;  Laterality: N/A;  100  . ESOPHAGOGASTRODUODENOSCOPY N/A 11/03/2016   Procedure: ESOPHAGOGASTRODUODENOSCOPY (EGD);  Surgeon: Rogene Houston, MD;  [facility];  Service: Endoscopy;  Laterality:  N/A;  2:45  . Heart stent     2012  x 2 stents  . Left knee     Arthroscopic  . Left wrist    . TONSILLECTOMY AND ADENOIDECTOMY    . TOTAL KNEE ARTHROPLASTY Left 09/26/2013   Procedure: TOTAL KNEE ARTHROPLASTY- LEFT;  Surgeon: [name]., MD;  Location: [facility];  Service: Orthopedics;  Laterality: Left;  . TUBAL LIGATION       reports that she quit smoking about 6 years ago. Her smoking use included Cigarettes. She started smoking about 38 years ago. She has a 10.00 pack-year smoking history. She has never used smokeless tobacco. She reports that she drinks alcohol. She reports that she does not use drugs.  Allergies  Allergen Reactions  . Ancef [Cefazolin] Itching    Itching around mouth - sx resolved quickly after administration of benadryl; with no further sx  . Ciprofloxacin Hcl     unknown  . Metaxalone     unknown  . Sulfa Antibiotics Itching, Rash and Other (See Comments)    "severe muscle cramps, tongue cracked, dry mouth "    Family History  Problem Relation Age of Onset  . Colon cancer Father   . Heart disease Unknown   . Arthritis Unknown   . Cancer Unknown      Prior to Admission medications   Medication Sig Start Date End Date Taking? Authorizing Provider  aspirin 81 MG tablet Take 81 mg by mouth daily.   Yes [provider]  diltiazem (CARDIZEM CD) 120 MG 24 hr capsule Take 1 capsule (120 mg total) by mouth daily. 12/20/16  Yes Herminio Commons, MD  DULoxetine (CYMBALTA) 60 MG capsule Take 60 mg by mouth every morning. 04/06/15  Yes [provider]  Evolocumab (REPATHA SURECLICK) 166 MG/ML SOAJ Inject 140 mg into the skin every 14 (fourteen) days. 05/24/17  Yes Herminio Commons, MD  fexofenadine (ALLEGRA) 180 MG tablet Take 180 mg by mouth daily.   Yes [provider]  furosemide (LASIX) 40 MG tablet Take 1 tablet (40 mg total) by mouth daily. 12/20/16  Yes Herminio Commons, MD  HYDROcodone-acetaminophen (NORCO) 10-325 MG  tablet Take 1 tablet by mouth 3 (three) times daily as needed for severe pain (chronic back and knee pain).   Yes [provider]  hydrocortisone (PROCTOSOL HC) 2.5 % rectal cream APPLY RECTALLY TWICE A DAY AS DIRECTED Patient taking differently: APPLY RECTALLY TWICE A DAY AS DIRECTED AS NEEDED FOR HEMORRHOIDS 04/18/16  Yes Setzer, Terri L, NP  isosorbide mononitrate (IMDUR) 30 MG 24 hr tablet Take 1 tablet (30 mg total) by mouth daily. 12/20/16  Yes Herminio Commons, MD  lisinopril-hydrochlorothiazide (PRINZIDE,ZESTORETIC) 10-12.5 MG tablet Take 1 tablet by mouth daily. 12/20/16  Yes Herminio Commons, MD  magnesium oxide (MAG-OX) 400 MG tablet Take 400 mg by mouth daily.   Yes [provider]  Melatonin 5 MG TABS Take 5 mg by mouth at bedtime.   Yes [provider]  nitroGLYCERIN (NITROSTAT) 0.4 MG SL tablet Place 1 tablet (0.4 mg total) under the tongue every 5 (five) minutes  as needed for chest pain. 11/30/15  Yes Herminio Commons, MD  omeprazole (PRILOSEC) 40 MG capsule Take 1 capsule (40 mg total) by mouth daily before breakfast. 11/03/16  Yes Rehman, Mechele Dawley, MD  Probiotic Product (PROBIOTIC DAILY PO) Take 1 tablet by mouth daily.   Yes [provider]  zolpidem (AMBIEN) 10 MG tablet Take 10 mg by mouth at bedtime.  03/23/16  Yes [provider]    Physical Exam: Vitals:   07/01/17 1615 07/01/17 1630 07/01/17 1700 07/01/17 1730  BP:  96/65 116/81 108/71  Pulse: 82     Resp: 19 (!) 9 (!) 22 18  Temp:      TempSrc:      SpO2: 96%     Weight:      Height:          Constitutional: NAD, calm, comfortable Vitals:   07/01/17 1615 07/01/17 1630 07/01/17 1700 07/01/17 1730  BP:  96/65 116/81 108/71  Pulse: 82     Resp: 19 (!) 9 (!) 22 18  Temp:      TempSrc:      SpO2: 96%     Weight:      Height:       Eyes: PERRL, lids and conjunctivae normal ENMT: Mucous membranes are moist. Posterior pharynx clear of any exudate or  lesions.Normal dentition.  Neck: normal, supple, no masses, no thyromegaly Respiratory: clear to auscultation bilaterally, no wheezing, no crackles. Normal respiratory effort. No accessory muscle use.  Cardiovascular: Regular rate and rhythm, no murmurs / rubs / gallops. No extremity edema. 2+ pedal pulses. No carotid bruits.  Abdomen: no tenderness, no masses palpated. No hepatosplenomegaly. Bowel sounds positive.  Musculoskeletal: no clubbing / cyanosis. No joint deformity upper and lower extremities. Good ROM, no contractures. Normal muscle tone.  Skin: no rashes, lesions, ulcers. No induration Neurologic: CN 2-12 grossly intact. Sensation intact, DTR normal. Strength 5/5 in all 4.  Psychiatric: Normal judgment and insight. Alert and oriented x 3. Normal mood.   Labs on Admission: I have personally reviewed following labs and imaging studies  CBC:  Recent Labs Lab 07/01/17 1345  WBC 6.5  HGB 14.6  HCT 43.0  MCV 88.3  PLT 546   Basic Metabolic Panel:  Recent Labs Lab 07/01/17 1345  NA 137  K 3.2*  CL 99*  CO2 28  GLUCOSE 107*  BUN 15  CREATININE 0.83  CALCIUM 9.5   Cardiac Enzymes:  Recent Labs Lab 07/01/17 1345  TROPONINI <0.03   Urine analysis:    Component Value Date/Time   COLORURINE YELLOW 07/19/2016 [place] 07/19/2016 1730   LABSPEC 1.020 07/19/2016 1730   PHURINE 6.0 07/19/2016 1730   GLUCOSEU NEGATIVE 07/19/2016 1730   HGBUR TRACE (A) 07/19/2016 1730   BILIRUBINUR NEGATIVE 07/19/2016 1730   KETONESUR NEGATIVE 07/19/2016 1730   PROTEINUR NEGATIVE 07/19/2016 1730   UROBILINOGEN 0.2 09/18/2013 1302   NITRITE NEGATIVE 07/19/2016 1730   LEUKOCYTESUR NEGATIVE 07/19/2016 1730   Radiological Exams on Admission: Dg Chest 2 View  Result Date: 07/01/2017 CLINICAL DATA:  CHEST PAIN, Chest pain that started today while walking up hill. Took 1 nitro and 2 baby asa with relief. HISTORY OF CAD, HTN, MI, PATIENT STATES " MI IN 2012, CARDIAC  CATH EXAM: CHEST  2 VIEW COMPARISON:  10/27/2016 FINDINGS: The heart size and mediastinal contours are within normal limits. Both lungs are clear. Degenerative changes are seen in thoracic spine. Remote right rib fractures. IMPRESSION: No  evidence for acute cardiopulmonary abnormality. Electronically Signed   By: Nolon Nations M.D.   On: 07/01/2017 14:20   Ct Angio Chest Pe W And/or Wo Contrast  Result Date: 07/01/2017 CLINICAL DATA:  Chest pain that began while walking up a hill today. EXAM: CT ANGIOGRAPHY CHEST WITH CONTRAST TECHNIQUE: Multidetector CT imaging of the chest was performed using the standard protocol during bolus administration of intravenous contrast. Multiplanar CT image reconstructions and MIPs were obtained to evaluate the vascular anatomy. CONTRAST:  100 cc Isovue 370. COMPARISON:  CT chest 04/10/2016. PA and lateral chest earlier today. FINDINGS: Cardiovascular: No pulmonary embolus is identified. Heart size is upper normal. No pericardial effusion. Tiny amount of aortic atherosclerosis noted. Mediastinum/Nodes: No lymphadenopathy. Small hiatal hernia noted. Thyroid gland appears normal. Lungs/Pleura: No pleural effusion.  Lungs are clear. Upper Abdomen: Cysts in the upper pole of the right kidney are noted. Musculoskeletal: No acute or focal bony abnormality. Review of the MIP images confirms the above findings. IMPRESSION: Negative for pulmonary embolus. No acute disease or finding to explain the patient's symptoms. Mild Aortic Atherosclerosis (ICD10-I70.0). Electronically Signed   By: Inge Rise M.D.   On: 07/01/2017 17:01    EKG: Independently reviewed.   Assessment/Plan Principal Problem:   Chest pain Active Problems:   CAD (coronary artery disease)   HTN (hypertension)   Hyperlipidemia   Obesity   GERD (gastroesophageal reflux disease)   PLAN:   Chest pain:  I am concerned that it may be unstable angina.  She was doing quite well after the cath and MI in 2012,  and said this pain was severe, though short lived.  It is similar to her prior IMI, except for the nausea.  EKG showed minimal changes in the inferior leads.  I will admit her and start IV Heparin, continue with ASA, and increase her Imdur to 60mg  per day.  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  I have not ordered an ECHO, but will consult cardiology in the morning, and make her NPO.  Her last negative nuclear stress test was at least 6 years ago.   HLD:  She is currently on PSK9 injection.  Will continue with it.   She has been intoleranced to crestor, Geoffery Spruce previously.  GERD:  Will continue with her PPI.  Esosphageal spasm can be relieved with NTG, but I don't think this is the case.  Depression:  She asked about the cymbalta, and would like to go off.  I will reduce dose to 30mg  per day, and will have her follow up with her PCP.  Chronic pain syndrome:  Consider lyrica on discharge.  Continue narcotic, home dose, at this time.    DVT prophylaxis: IV Heparin.  Code Status: FULL CODE.  Family Communication: husband at bedside.  Disposition Plan: home.  Consults called: None. Admission status: inpatient.    Deaundre Allston MD FACP. [facility]  If 7PM-7AM, please contact night-coverage [email or web address] Password [facility]  07/01/2017, 6:29 PM

## 2017-07-01 NOTE — Plan of Care (Signed)
Problem: Pain Managment: Goal: General experience of comfort will improve Outcome: Progressing Pt has no complaints of pain at moment. Patient knows to request pain medication when needed.  Problem: Tissue Perfusion: Goal: Risk factors for ineffective tissue perfusion will decrease Outcome: Progressing Patient is currently on a heparin drip.

## 2017-07-01 NOTE — Progress Notes (Signed)
ANTICOAGULATION CONSULT NOTE - Initial Consult  Pharmacy Consult for heparin Indication: chest pain/ACS  Allergies  Allergen Reactions  . Ancef [Cefazolin] Itching    Itching around mouth - sx resolved quickly after administration of benadryl; with no further sx  . Ciprofloxacin Hcl     unknown  . Metaxalone     unknown  . Sulfa Antibiotics Itching, Rash and Other (See Comments)    "severe muscle cramps, tongue cracked, dry mouth "    Patient Measurements: Height: 5\' 2"  (157.5 cm) Weight: 180 lb (81.6 kg) IBW/kg (Calculated) : 50.1 Heparin Dosing Weight: 68 kg  Vital Signs: Temp: 98.5 F (36.9 C) (08/05 1343) Temp Source: Oral (08/05 1343) BP: 108/71 (08/05 1730) Pulse Rate: 82 (08/05 1615)  Labs:  Recent Labs  07/01/17 1345  HGB 14.6  HCT 43.0  PLT 346  CREATININE 0.83  TROPONINI <0.03    Estimated Creatinine Clearance: 71.3 mL/min (by C-G formula based on SCr of 0.83 mg/dL).   Medical History: Past Medical History:  Diagnosis Date  . Anxiety   . CAD (coronary artery disease)    PCI Stent RCA 03/12/11.  Cath 03/22/11 LVEF 55-60%.  RCA patent  proximal/distal stents with 30% proximal disease and 20% mid stenosis.  LCX with 30%  mid stenosis.  Questionable spasm.  . Carotid stenosis    Less than 50% 8/11  . GERD (gastroesophageal reflux disease) 03/02/2014  . H/O hiatal hernia   . HTN (hypertension)   . Hyperlipidemia   . Myocardial infarction ([place])   . Renal disorder    kidney stone    Medications:  See medication history  Assessment: 61 yo lady to start heparin for CP.  She was not on anticoagulation PTA.Baseline CBC within normal limits   Goal of Therapy:  Heparin level 0.3-0.7 units/ml Monitor platelets by anticoagulation protocol: Yes   Plan:  Heparin 3000 unit bolus and drip at 1100 units/hr Check heparin level and CBC daily while on heparin  Konstantine Gervasi Poteet 07/01/2017,6:44 PM

## 2017-07-01 NOTE — ED Notes (Signed)
From Xray 

## 2017-07-01 NOTE — ED Triage Notes (Signed)
Chest pain that started today while walking up hill. Took 1 nitro and 2 baby asa with relief. NAD .

## 2017-07-01 NOTE — ED Provider Notes (Signed)
[facility] DEPT Provider Note   CSN: [id number] Arrival date & time: 07/01/17  1334     History   Chief Complaint Chief Complaint  Patient presents with  . Chest Pain    HPI [name] is a 61 y.o. female.   Chest Pain   This is a new problem. The current episode started less than 1 hour ago. The problem occurs constantly. The problem has been resolved. The pain is associated with exertion. The pain is present in the substernal region. The pain is moderate. The quality of the pain is described as brief and exertional. The pain does not radiate. The symptoms are aggravated by certain positions. Associated symptoms include exertional chest pressure. Pertinent negatives include no abdominal pain. She has tried nothing for the symptoms. The treatment provided no relief.    Past Medical History:  Diagnosis Date  . Anxiety   . CAD (coronary artery disease)    PCI Stent RCA 03/12/11.  Cath 03/22/11 LVEF 55-60%.  RCA patent  proximal/distal stents with 30% proximal disease and 20% mid stenosis.  LCX with 30%  mid stenosis.  Questionable spasm.  . Carotid stenosis    Less than 50% 8/11  . GERD (gastroesophageal reflux disease) 03/02/2014  . H/O hiatal hernia   . HTN (hypertension)   . Hyperlipidemia   . Myocardial infarction ([place])   . Renal disorder    kidney stone    Patient Active Problem List   Diagnosis Date Noted  . Chest pain 07/01/2017  . Abdominal pain, epigastric 10/10/2016  . Gastroesophageal reflux disease without esophagitis 10/10/2016  . GERD (gastroesophageal reflux disease) 03/02/2014  . Family hx of colon cancer 03/02/2014  . 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 Arthritis of knee 10/31/2011  . Tobacco abuse 03/28/2011  . Obesity 03/28/2011  . CAD (coronary artery disease)   . HTN (hypertension)   . Hyperlipidemia   . Anxiety     Past Surgical History:  Procedure Laterality Date  . CARDIAC SURGERY     stent placement  . CHOLECYSTECTOMY    . COLONOSCOPY  N/A 04/01/2014   Procedure: COLONOSCOPY;  Surgeon: Rogene Houston, MD;  Location: [facility];  Service: Endoscopy;  Laterality: N/A;  100  . Cyst removed from spine    . ECTOPIC PREGNANCY SURGERY    . ESOPHAGOGASTRODUODENOSCOPY N/A 04/01/2014   Procedure: ESOPHAGOGASTRODUODENOSCOPY (EGD);  Surgeon: Rogene Houston, MD;  Location: [facility];  Service: Endoscopy;  Laterality: N/A;  100  . ESOPHAGOGASTRODUODENOSCOPY N/A 11/03/2016   Procedure: ESOPHAGOGASTRODUODENOSCOPY (EGD);  Surgeon: Rogene Houston, MD;  Location: [facility];  Service: Endoscopy;  Laterality: N/A;  2:45  . Heart stent     2012  x 2 stents  . Left knee     Arthroscopic  . Left wrist    . TONSILLECTOMY AND ADENOIDECTOMY    . TOTAL KNEE ARTHROPLASTY Left 09/26/2013   Procedure: TOTAL KNEE ARTHROPLASTY- LEFT;  Surgeon: [name]., MD;  Location: [facility];  Service: Orthopedics;  Laterality: Left;  . TUBAL LIGATION      OB History    No data available       Home Medications    Prior to Admission medications   Medication Sig Start Date End Date Taking? Authorizing Provider  aspirin 81 MG tablet Take 81 mg by mouth daily.   Yes [provider]  diltiazem (CARDIZEM CD) 120 MG 24 hr capsule Take 1 capsule (120 mg total) by mouth  daily. 12/20/16  Yes Herminio Commons, MD  DULoxetine (CYMBALTA) 60 MG capsule Take 60 mg by mouth every morning. 04/06/15  Yes [provider]  Evolocumab (REPATHA SURECLICK) 416 MG/ML SOAJ Inject 140 mg into the skin every 14 (fourteen) days. 05/24/17  Yes Herminio Commons, MD  fexofenadine (ALLEGRA) 180 MG tablet Take 180 mg by mouth daily.   Yes [provider]  furosemide (LASIX) 40 MG tablet Take 1 tablet (40 mg total) by mouth daily. 12/20/16  Yes Herminio Commons, MD  HYDROcodone-acetaminophen (NORCO) 10-325 MG tablet Take 1 tablet by mouth 3 (three) times daily as needed for severe pain (chronic back and knee pain).   Yes [provider]    hydrocortisone (PROCTOSOL HC) 2.5 % rectal cream APPLY RECTALLY TWICE A DAY AS DIRECTED Patient taking differently: APPLY RECTALLY TWICE A DAY AS DIRECTED AS NEEDED FOR HEMORRHOIDS 04/18/16  Yes Setzer, Terri L, NP  isosorbide mononitrate (IMDUR) 30 MG 24 hr tablet Take 1 tablet (30 mg total) by mouth daily. 12/20/16  Yes Herminio Commons, MD  lisinopril-hydrochlorothiazide (PRINZIDE,ZESTORETIC) 10-12.5 MG tablet Take 1 tablet by mouth daily. 12/20/16  Yes Herminio Commons, MD  magnesium oxide (MAG-OX) 400 MG tablet Take 400 mg by mouth daily.   Yes [provider]  Melatonin 5 MG TABS Take 5 mg by mouth at bedtime.   Yes [provider]  nitroGLYCERIN (NITROSTAT) 0.4 MG SL tablet Place 1 tablet (0.4 mg total) under the tongue every 5 (five) minutes as needed for chest pain. 11/30/15  Yes Herminio Commons, MD  omeprazole (PRILOSEC) 40 MG capsule Take 1 capsule (40 mg total) by mouth daily before breakfast. 11/03/16  Yes Rehman, Mechele Dawley, MD  Probiotic Product (PROBIOTIC DAILY PO) Take 1 tablet by mouth daily.   Yes [provider]  zolpidem (AMBIEN) 10 MG tablet Take 10 mg by mouth at bedtime.  03/23/16  Yes [provider]    Family History Family History  Problem Relation Age of Onset  . Colon cancer Father   . Heart disease Unknown   . Arthritis Unknown   . Cancer Unknown     Social History Social History  Substance Use Topics  . Smoking status: Former Smoker    Packs/day: 1.00    Years: 10.00    Types: Cigarettes    Start date: 06/27/1979    Quit date: 11/27/2010  . Smokeless tobacco: Never Used  . Alcohol use 0.0 oz/week     Comment: social     Allergies   Ancef [cefazolin]; Ciprofloxacin hcl; Metaxalone; and Sulfa antibiotics   Review of Systems Review of Systems  Cardiovascular: Positive for chest pain.  Gastrointestinal: Negative for abdominal pain.  All other systems reviewed and are negative.    Physical Exam Updated  Vital Signs BP 111/79 (BP Location: Left Arm)   Pulse 82   Temp 98.5 F (36.9 C) (Oral)   Resp 18   Ht 5\' 2"  (1.575 m)   Wt 81.6 kg (180 lb)   SpO2 99%   BMI 32.92 kg/m   Physical Exam  Constitutional: She is oriented to person, place, and time. She appears well-developed and well-nourished.  HENT:  Head: Normocephalic and atraumatic.  Eyes: Conjunctivae and EOM are normal.  Neck: Normal range of motion.  Cardiovascular: Normal rate and regular rhythm.   Pulmonary/Chest: No stridor. No respiratory distress.  Abdominal: Soft. She exhibits no distension.  Neurological: She is alert and oriented to person, place,  and time. No cranial nerve deficit.  Skin: Skin is warm and dry.  Nursing note and vitals reviewed.    ED Treatments / Results  Labs (all labs ordered are listed, but only abnormal results are displayed) Labs Reviewed  BASIC METABOLIC PANEL - Abnormal; Notable for the following:       Result Value   Potassium 3.2 (*)    Chloride 99 (*)    Glucose, Bld 107 (*)    All other components within normal limits  D-DIMER, QUANTITATIVE (NOT AT [facility]) - Abnormal; Notable for the following:    D-Dimer, Quant 0.99 (*)    All other components within normal limits  CBC  TROPONIN I  HEPARIN LEVEL (UNFRACTIONATED)  CBC  HIV ANTIBODY (ROUTINE TESTING)  TSH  TROPONIN I  TROPONIN I  TROPONIN I    EKG  EKG Interpretation  Date/Time:  Sunday July 01 2017 13:40:59 EDT Ventricular Rate:  94 PR Interval:    QRS Duration: 100 QT Interval:  371 QTC Calculation: 464 R Axis:   56 Text Interpretation:  Sinus rhythm Borderline T wave abnormalities seemed to be flattened in inferior leads compared to december Confirmed by Merrily Pew [phone]) on 07/01/2017 2:35:28 PM       Radiology Dg Chest 2 View  Result Date: 07/01/2017 CLINICAL DATA:  CHEST PAIN, Chest pain that started today while walking up hill. Took 1 nitro and 2 baby asa with relief. HISTORY OF CAD, HTN, MI, PATIENT  STATES " MI IN 2012, CARDIAC CATH EXAM: CHEST  2 VIEW COMPARISON:  10/27/2016 FINDINGS: The heart size and mediastinal contours are within normal limits. Both lungs are clear. Degenerative changes are seen in thoracic spine. Remote right rib fractures. IMPRESSION: No evidence for acute cardiopulmonary abnormality. Electronically Signed   By: Nolon Nations M.D.   On: 07/01/2017 14:20   Ct Angio Chest Pe W And/or Wo Contrast  Result Date: 07/01/2017 CLINICAL DATA:  Chest pain that began while walking up a hill today. EXAM: CT ANGIOGRAPHY CHEST WITH CONTRAST TECHNIQUE: Multidetector CT imaging of the chest was performed using the standard protocol during bolus administration of intravenous contrast. Multiplanar CT image reconstructions and MIPs were obtained to evaluate the vascular anatomy. CONTRAST:  100 cc Isovue 370. COMPARISON:  CT chest 04/10/2016. PA and lateral chest earlier today. FINDINGS: Cardiovascular: No pulmonary embolus is identified. Heart size is upper normal. No pericardial effusion. Tiny amount of aortic atherosclerosis noted. Mediastinum/Nodes: No lymphadenopathy. Small hiatal hernia noted. Thyroid gland appears normal. Lungs/Pleura: No pleural effusion.  Lungs are clear. Upper Abdomen: Cysts in the upper pole of the right kidney are noted. Musculoskeletal: No acute or focal bony abnormality. Review of the MIP images confirms the above findings. IMPRESSION: Negative for pulmonary embolus. No acute disease or finding to explain the patient's symptoms. Mild Aortic Atherosclerosis (ICD10-I70.0). Electronically Signed   By: Inge Rise M.D.   On: 07/01/2017 17:01    Procedures Procedures (including critical care time)  Medications Ordered in ED Medications  HYDROcodone-acetaminophen (NORCO) 10-325 MG per tablet 1 tablet (1 tablet Oral Given 07/01/17 2033)  Evolocumab SOAJ 140 mg (140 mg Subcutaneous Not Given 07/01/17 2304)  diltiazem (CARDIZEM CD) 24 hr capsule 120 mg (not  administered)  pantoprazole (PROTONIX) EC tablet 40 mg (not administered)  zolpidem (AMBIEN) tablet 5 mg (5 mg Oral Given 07/01/17 2248)  FLORA-Q Capsule ( Oral Not Given 07/01/17 2305)  aspirin chewable tablet 81 mg (not administered)  ondansetron (ZOFRAN) tablet 4 mg (not administered)  Or  ondansetron [facility]) injection 4 mg (not administered)  docusate sodium (COLACE) capsule 100 mg (100 mg Oral Given 07/01/17 2248)  DULoxetine (CYMBALTA) DR capsule 30 mg (30 mg Oral Not Given 07/01/17 2303)  isosorbide mononitrate (IMDUR) 24 hr tablet 60 mg (60 mg Oral Given 07/01/17 2248)  morphine 2 MG/ML injection 2 mg (not administered)  heparin ADULT infusion 100 units/mL (25000 units/256mL sodium chloride 0.45%) (1,100 Units/hr Intravenous Transfusing/Transfer 07/01/17 2035)  iopamidol (ISOVUE-370) 76 % injection 100 mL (100 mLs Intravenous Contrast Given 07/01/17 1627)  heparin bolus via infusion 3,000 Units (3,000 Units Intravenous Bolus from Bag 07/01/17 1924)     Initial Impression / Assessment and Plan / ED Course  I have reviewed the triage vital signs and the nursing notes.  Pertinent labs & imaging results that were available during my care of the patient were reviewed by me and considered in my medical decision making (see chart for details).  High risk 2/2 history of multiple stents. Exertional chest pain relieved by NTG. Ct negative. Will admit for observation to medicine.   Final Clinical Impressions(s) / ED Diagnoses   Final diagnoses:  Nonspecific chest pain     Laveyah Oriol, Corene Cornea, MD 07/01/17 2349

## 2017-07-02 DIAGNOSIS — R079 Chest pain, unspecified: Secondary | ICD-10-CM | POA: Diagnosis not present

## 2017-07-02 LAB — HEPARIN LEVEL (UNFRACTIONATED)
HEPARIN UNFRACTIONATED: 0.66 [IU]/mL (ref 0.30–0.70)
Heparin Unfractionated: 0.75 IU/mL — ABNORMAL HIGH (ref 0.30–0.70)

## 2017-07-02 LAB — TROPONIN I: Troponin I: <0.03 ng/mL (ref <0.03)

## 2017-07-02 LAB — CBC
HEMATOCRIT: 39.2 % (ref 36.0–46.0)
Hemoglobin: 13.3 g/dL (ref 12.0–15.0)
MCH: 30.2 pg (ref 26.0–34.0)
MCHC: 33.9 g/dL (ref 30.0–36.0)
MCV: 88.9 fL (ref 78.0–100.0)
Platelets: 318 10*3/uL (ref 150–400)
RBC: 4.41 MIL/uL (ref 3.87–5.11)
RDW: 13.5 % (ref 11.5–15.5)
WBC: 7.5 10*3/uL (ref 4.0–10.5)

## 2017-07-02 LAB — TSH: TSH: 1.482 u[IU]/mL (ref 0.350–4.500)

## 2017-07-02 MED ORDER — ASPIRIN 81 MG PO CHEW: 81.0000 mg | CHEWABLE_TABLET | Freq: Every day | ORAL | Status: DC

## 2017-07-02 MED ORDER — MORPHINE SULFATE (PF) 4 MG/ML IV SOLN: 2.0000 mg | INTRAVENOUS | Status: DC | PRN

## 2017-07-02 MED ORDER — SODIUM CHLORIDE 0.9% FLUSH
3.0000 mL | Freq: Two times a day (BID) | INTRAVENOUS | Status: DC
  Administered 2017-07-02: 3 mL via INTRAVENOUS

## 2017-07-02 MED ORDER — SODIUM CHLORIDE 0.9 % WEIGHT BASED INFUSION
3.0000 mL/kg/h | INTRAVENOUS | Status: DC
  Administered 2017-07-03: 06:00:00 3 mL/kg/h via INTRAVENOUS

## 2017-07-02 MED ORDER — ASPIRIN 81 MG PO CHEW
81.0000 mg | CHEWABLE_TABLET | ORAL | Status: AC
  Administered 2017-07-03: 81 mg via ORAL
  Filled 2017-07-02: qty 1

## 2017-07-02 MED ORDER — SODIUM CHLORIDE 0.9 % WEIGHT BASED INFUSION: 1.0000 mL/kg/h | INTRAVENOUS | Status: DC

## 2017-07-02 MED ORDER — SODIUM CHLORIDE 0.9 % IV SOLN: 250.0000 mL | INTRAVENOUS | Status: DC | PRN

## 2017-07-02 MED ORDER — SODIUM CHLORIDE 0.9% FLUSH: 3.0000 mL | INTRAVENOUS | Status: DC | PRN

## 2017-07-02 MED ORDER — SACCHAROMYCES BOULARDII 250 MG PO CAPS
250.0000 mg | ORAL_CAPSULE | Freq: Two times a day (BID) | ORAL | Status: DC
  Administered 2017-07-02 – 2017-07-03 (×3): 250 mg via ORAL
  Filled 2017-07-02 (×4): qty 1

## 2017-07-02 NOTE — Progress Notes (Addendum)
[name] for Heparin Indication: chest pain/ACS  Allergies  Allergen Reactions  . Ancef [Cefazolin] Itching    Itching around mouth - sx resolved quickly after administration of benadryl; with no further sx  . Ciprofloxacin Hcl     unknown  . Metaxalone     unknown  . Sulfa Antibiotics Itching, Rash and Other (See Comments)    "severe muscle cramps, tongue cracked, dry mouth "   Patient Measurements: Height: 5\' 2"  (157.5 cm) Weight: 180 lb (81.6 kg) IBW/kg (Calculated) : 50.1 HEPARIN DW (KG): 68.3  Vital Signs: Temp: 98.1 F (36.7 C) (08/06 0537) Temp Source: Oral (08/06 0537) BP: 104/66 (08/06 0537) Pulse Rate: 75 (08/06 0537)  Labs:  Recent Labs  07/01/17 1345 07/02/17 0016 07/02/17 0630  HGB 14.6  --  13.3  HCT 43.0  --  39.2  PLT 346  --  318  HEPARINUNFRC  --   --  0.75*  CREATININE 0.83  --   --   TROPONINI <0.03 <0.03 <0.03    Estimated Creatinine Clearance: 71.3 mL/min (by C-G formula based on SCr of 0.83 mg/dL).   Medical History: Past Medical History:  Diagnosis Date  . Anxiety   . CAD (coronary artery disease)    PCI Stent RCA 03/12/11.  Cath 03/22/11 LVEF 55-60%.  RCA patent  proximal/distal stents with 30% proximal disease and 20% mid stenosis.  LCX with 30%  mid stenosis.  Questionable spasm.  . Carotid stenosis    Less than 50% 8/11  . GERD (gastroesophageal reflux disease) 03/02/2014  . H/O hiatal hernia   . HTN (hypertension)   . Hyperlipidemia   . Myocardial infarction ([place])   . Renal disorder    kidney stone    Medications Prior to Admission  Medication Sig Dispense Refill  . aspirin 81 MG tablet Take 81 mg by mouth daily.    Marland Kitchen diltiazem (CARDIZEM CD) 120 MG 24 hr capsule Take 1 capsule (120 mg total) by mouth daily. 90 capsule 3  . DULoxetine (CYMBALTA) 60 MG capsule Take 60 mg by mouth every morning.  3  . Evolocumab (REPATHA SURECLICK) 546 MG/ML SOAJ Inject 140 mg into the skin every 14  (fourteen) days. 6 pen 3  . fexofenadine (ALLEGRA) 180 MG tablet Take 180 mg by mouth daily.    . furosemide (LASIX) 40 MG tablet Take 1 tablet (40 mg total) by mouth daily. 90 tablet 3  . HYDROcodone-acetaminophen (NORCO) 10-325 MG tablet Take 1 tablet by mouth 3 (three) times daily as needed for severe pain (chronic back and knee pain).    . hydrocortisone (PROCTOSOL HC) 2.5 % rectal cream APPLY RECTALLY TWICE A DAY AS DIRECTED (Patient taking differently: APPLY RECTALLY TWICE A DAY AS DIRECTED AS NEEDED FOR HEMORRHOIDS) 28.35 g 2  . isosorbide mononitrate (IMDUR) 30 MG 24 hr tablet Take 1 tablet (30 mg total) by mouth daily. 90 tablet 3  . lisinopril-hydrochlorothiazide (PRINZIDE,ZESTORETIC) 10-12.5 MG tablet Take 1 tablet by mouth daily. 90 tablet 3  . magnesium oxide (MAG-OX) 400 MG tablet Take 400 mg by mouth daily.    . Melatonin 5 MG TABS Take 5 mg by mouth at bedtime.    . nitroGLYCERIN (NITROSTAT) 0.4 MG SL tablet Place 1 tablet (0.4 mg total) under the tongue every 5 (five) minutes as needed for chest pain. 25 tablet 3  . omeprazole (PRILOSEC) 40 MG capsule Take 1 capsule (40 mg total) by mouth daily before breakfast. 60 capsule 3  .  Probiotic Product (PROBIOTIC DAILY PO) Take 1 tablet by mouth daily.    Marland Kitchen zolpidem (AMBIEN) 10 MG tablet Take 10 mg by mouth at bedtime.   3   Assessment: 61 yo lady to start heparin for CP.  Heparin level slightly above goal.  No bleeding noted.  CBC stable.  Goal of Therapy:  Heparin level 0.3-0.7 units/ml Monitor platelets by anticoagulation protocol: Yes   Plan:  Decrease heparin drip to 1000 units/hr. Repeat heparin level in 6-8 hours. Check heparin level and CBC daily while on heparin.  [name] 07/02/2017,8:08 AM  Addum:  Heparin level this afternoon therapeutic.  Cont drip at 1000 units/hr.  F/u am labs Excell Seltzer, PharmD

## 2017-07-02 NOTE — Consult Note (Signed)
Cardiology Consultation:   Patient ID: [name]; [id number]; [birth year]   Admit date: 07/01/2017 Date of Consult: 07/02/2017  Primary Care Provider: Rory Percy, MD Primary Cardiologist: Bronson Ing Primary Electrophysiologist:  None    Patient Profile:   [name] is a 61 y.o. female with a hx of CAD who is being seen today for the evaluation of chest pain at the request of Dr Marin Comment.  History of Present Illness:   [name] 61 y.o. with history of IMI and stents done in 2012. Had early repeat cath 2 weeks latter and stents patent with no significant left sided disease. ? Spasm and on calcium blockers EF normal Last myovue December 2012 normal 2 months ago had SSCP relieved with nitro . Has severe SSCP associated with diaphoresis and dyspnea Relieved with nitro. Pain radiated to right jaw and shoulder Not recurrent since admission No acute ECG changes just low voltage CT no PE done for positive d dimer   Past Medical History:  Diagnosis Date  . Anxiety   . CAD (coronary artery disease)    PCI Stent RCA 03/12/11.  Cath 03/22/11 LVEF 55-60%.  RCA patent  proximal/distal stents with 30% proximal disease and 20% mid stenosis.  LCX with 30%  mid stenosis.  Questionable spasm.  . Carotid stenosis    Less than 50% 8/11  . GERD (gastroesophageal reflux disease) 03/02/2014  . H/O hiatal hernia   . HTN (hypertension)   . Hyperlipidemia   . Myocardial infarction ([place])   . Renal disorder    kidney stone    Past Surgical History:  Procedure Laterality Date  . CARDIAC SURGERY     stent placement  . CHOLECYSTECTOMY    . COLONOSCOPY N/A 04/01/2014   Procedure: COLONOSCOPY;  Surgeon: Rogene Houston, MD;  [facility]: [facility];  Service: Endoscopy;  Laterality: N/A;  100  . Cyst removed from spine    . ECTOPIC PREGNANCY SURGERY    . ESOPHAGOGASTRODUODENOSCOPY N/A 04/01/2014   Procedure: ESOPHAGOGASTRODUODENOSCOPY (EGD);  Surgeon: Rogene Houston, MD;  [facility];   Service: Endoscopy;  Laterality: N/A;  100  . ESOPHAGOGASTRODUODENOSCOPY N/A 11/03/2016   Procedure: ESOPHAGOGASTRODUODENOSCOPY (EGD);  Surgeon: Rogene Houston, MD;  Location: [facility];  Service: Endoscopy;  Laterality: N/A;  2:45  . Heart stent     2012  x 2 stents  . Left knee     Arthroscopic  . Left wrist    . TONSILLECTOMY AND ADENOIDECTOMY    . TOTAL KNEE ARTHROPLASTY Left 09/26/2013   Procedure: TOTAL KNEE ARTHROPLASTY- LEFT;  Surgeon: [name]., MD;  Location: [place];  Service: Orthopedics;  Laterality: Left;  . TUBAL LIGATION       Inpatient Medications: Scheduled Meds: . aspirin  81 mg Oral Daily  . diltiazem  120 mg Oral Daily  . docusate sodium  100 mg Oral BID  . DULoxetine  30 mg Oral Daily  . isosorbide mononitrate  60 mg Oral Daily  . pantoprazole  40 mg Oral Daily  . saccharomyces boulardii  250 mg Oral BID  . zolpidem  5 mg Oral QHS   Continuous Infusions: . heparin 1,000 Units/hr (07/02/17 0815)   PRN Meds: HYDROcodone-acetaminophen, morphine injection, ondansetron **OR** ondansetron (ZOFRAN) IV  Allergies:    Allergies  Allergen Reactions  . Ancef [Cefazolin] Itching    Itching around mouth - sx resolved quickly after administration of benadryl; with no further sx  . Ciprofloxacin Hcl  unknown  . Metaxalone     unknown  . Sulfa Antibiotics Itching, Rash and Other (See Comments)    "severe muscle cramps, tongue cracked, dry mouth "    Social History:   Social History   Social History  . Marital status: Married    Spouse name: N/A  . Number of children: N/A  . Years of education: N/A   Occupational History  . Not on file.   Social History Main Topics  . Smoking status: Former Smoker    Packs/day: 1.00    Years: 10.00    Types: Cigarettes    Start date: 06/27/1979    Quit date: 11/27/2010  . Smokeless tobacco: Never Used  . Alcohol use 0.0 oz/week     Comment: social  . Drug use: No  . Sexual activity: Not on file   Other  Topics Concern  . Not on file   Social History Narrative  . No narrative on file    Family History:   Family History  Problem Relation Age of Onset  . Colon cancer Father   . Heart disease Unknown   . Arthritis Unknown   . Cancer Unknown      ROS:  Please see the history of present illness.  ROS  All other ROS reviewed and negative.     Physical Exam/Data:   Vitals:   07/01/17 1900 07/01/17 2000 07/01/17 2053 07/02/17 0537  BP: 116/61 116/80 111/79 104/66  Pulse: 85 82 82 75  Resp: 13 18 18 18   Temp:   98.5 F (36.9 C) 98.1 F (36.7 C)  TempSrc:   Oral Oral  SpO2: 96% 96% 99% 95%  Weight:      Height:        Intake/Output Summary (Last 24 hours) at 07/02/17 0845 Last data filed at 07/02/17 0300  Gross per 24 hour  Intake             83.6 ml  Output                0 ml  Net             83.6 ml   Filed Weights   07/01/17 1344  Weight: 180 lb (81.6 kg)   Body mass index is 32.92 kg/m.  General:  Well nourished, well developed, in no acute distress  HEENT: normal Lymph: no adenopathy Neck: no JVD Endocrine:  No thryomegaly Vascular: No carotid bruits; FA pulses 2+ bilaterally without bruits  Cardiac:  normal S1, S2; RRR; no murmur   Lungs:  clear to auscultation bilaterally, no wheezing, rhonchi or rales  Abd: soft, nontender, no hepatomegaly  Ext: no edema Musculoskeletal:  No deformities, BUE and BLE strength normal and equal Skin: warm and dry  Neuro:  CNs 2-12 intact, no focal abnormalities noted Psych:  Normal affect   EKG:  The EKG was personally reviewed and demonstrates:  NSR low voltage  Telemetry:  Telemetry was personally reviewed and demonstrates:   NSR no arrhythmia  Relevant CV Studies: None  Laboratory Data:  Chemistry Recent Labs Lab 07/01/17 1345  NA 137  K 3.2*  CL 99*  CO2 28  GLUCOSE 107*  BUN 15  CREATININE 0.83  CALCIUM 9.5  GFRNONAA >60  GFRAA >60  ANIONGAP 10    No results for input(s): PROT, ALBUMIN, AST,  ALT, ALKPHOS, BILITOT in the last 168 hours. Hematology Recent Labs Lab 07/01/17 1345 07/02/17 0630  WBC 6.5 7.5  RBC 4.87 4.41  HGB 14.6 13.3  HCT 43.0 39.2  MCV 88.3 88.9  MCH 30.0 30.2  MCHC 34.0 33.9  RDW 13.2 13.5  PLT 346 318   Cardiac Enzymes Recent Labs Lab 07/01/17 1345 07/02/17 0016 07/02/17 0630  TROPONINI <0.03 <0.03 <0.03   No results for input(s): TROPIPOC in the last 168 hours.  BNPNo results for input(s): BNP, PROBNP in the last 168 hours.  DDimer  Recent Labs Lab 07/01/17 1317  DDIMER 0.99*    Radiology/Studies:  Dg Chest 2 View  Result Date: 07/01/2017 CLINICAL DATA:  CHEST PAIN, Chest pain that started today while walking up hill. Took 1 nitro and 2 baby asa with relief. HISTORY OF CAD, HTN, MI, PATIENT STATES " MI IN 2012, CARDIAC CATH EXAM: CHEST  2 VIEW COMPARISON:  10/27/2016 FINDINGS: The heart size and mediastinal contours are within normal limits. Both lungs are clear. Degenerative changes are seen in thoracic spine. Remote right rib fractures. IMPRESSION: No evidence for acute cardiopulmonary abnormality. Electronically Signed   By: Nolon Nations M.D.   On: 07/01/2017 14:20   Ct Angio Chest Pe W And/or Wo Contrast  Result Date: 07/01/2017 CLINICAL DATA:  Chest pain that began while walking up a hill today. EXAM: CT ANGIOGRAPHY CHEST WITH CONTRAST TECHNIQUE: Multidetector CT imaging of the chest was performed using the standard protocol during bolus administration of intravenous contrast. Multiplanar CT image reconstructions and MIPs were obtained to evaluate the vascular anatomy. CONTRAST:  100 cc Isovue 370. COMPARISON:  CT chest 04/10/2016. PA and lateral chest earlier today. FINDINGS: Cardiovascular: No pulmonary embolus is identified. Heart size is upper normal. No pericardial effusion. Tiny amount of aortic atherosclerosis noted. Mediastinum/Nodes: No lymphadenopathy. Small hiatal hernia noted. Thyroid gland appears normal. Lungs/Pleura: No  pleural effusion.  Lungs are clear. Upper Abdomen: Cysts in the upper pole of the right kidney are noted. Musculoskeletal: No acute or focal bony abnormality. Review of the MIP images confirms the above findings. IMPRESSION: Negative for pulmonary embolus. No acute disease or finding to explain the patient's symptoms. Mild Aortic Atherosclerosis (ICD10-I70.0). Electronically Signed   By: Inge Rise M.D.   On: 07/01/2017 17:01    Assessment and Plan:   1. Chest Pain:  Relieved with nitro history of RCA stents that are over 66 years old Discussed options with patient favor transfer to [facility] for cath today /tomorrow depending on schedule. Continue heparin Risks including stroke MI, bleeding and contrast reaction discussed willing to proceed. [name] link called orders written Called [facility] at [facility] to inform of transfer      Signed, Jenkins Rouge, MD  07/02/2017 8:45 AM

## 2017-07-03 ENCOUNTER — Telehealth: Payer: Self-pay | Admitting: Cardiology

## 2017-07-03 ENCOUNTER — Encounter (HOSPITAL_COMMUNITY)
Admission: EM | Disposition: A | Discharge status: Home / Self Care | Payer: Self-pay | Source: Home / Self Care | Attending: Emergency Medicine

## 2017-07-03 ENCOUNTER — Encounter (HOSPITAL_COMMUNITY): Payer: Self-pay | Admitting: Physician Assistant

## 2017-07-03 DIAGNOSIS — I25110 Atherosclerotic heart disease of native coronary artery with unstable angina pectoris: Secondary | ICD-10-CM | POA: Diagnosis not present

## 2017-07-03 DIAGNOSIS — I2582 Chronic total occlusion of coronary artery: Secondary | ICD-10-CM | POA: Diagnosis present

## 2017-07-03 HISTORY — PX: LEFT HEART CATH AND CORONARY ANGIOGRAPHY: CATH118249

## 2017-07-03 LAB — CBC
HCT: 38.6 % (ref 36.0–46.0)
Hemoglobin: 12.9 g/dL (ref 12.0–15.0)
MCH: 28.9 pg (ref 26.0–34.0)
MCHC: 33.4 g/dL (ref 30.0–36.0)
MCV: 86.5 fL (ref 78.0–100.0)
PLATELETS: 316 10*3/uL (ref 150–400)
RBC: 4.46 MIL/uL (ref 3.87–5.11)
RDW: 13.2 % (ref 11.5–15.5)
WBC: 9.4 10*3/uL (ref 4.0–10.5)

## 2017-07-03 LAB — HIV ANTIBODY (ROUTINE TESTING W REFLEX): HIV SCREEN 4TH GENERATION: NONREACTIVE

## 2017-07-03 LAB — POCT ACTIVATED CLOTTING TIME: Activated Clotting Time: 180 seconds

## 2017-07-03 LAB — BASIC METABOLIC PANEL
ANION GAP: 7 (ref 5–15)
BUN: 11 mg/dL (ref 6–20)
CALCIUM: 9.1 mg/dL (ref 8.9–10.3)
CO2: 27 mmol/L (ref 22–32)
CREATININE: 0.96 mg/dL (ref 0.44–1.00)
Chloride: 104 mmol/L (ref 101–111)
GFR calc Af Amer: >60 mL/min (ref >60)
GFR calc non Af Amer: >60 mL/min (ref >60)
GLUCOSE: 105 mg/dL — AB (ref 65–99)
Potassium: 3.7 mmol/L (ref 3.5–5.1)
Sodium: 138 mmol/L (ref 135–145)

## 2017-07-03 LAB — PROTIME-INR
INR: 0.98
PROTHROMBIN TIME: 12.9 s (ref 11.4–15.2)

## 2017-07-03 LAB — HEPARIN LEVEL (UNFRACTIONATED): Heparin Unfractionated: 0.51 IU/mL (ref 0.30–0.70)

## 2017-07-03 SURGERY — LEFT HEART CATH AND CORONARY ANGIOGRAPHY
Anesthesia: LOCAL

## 2017-07-03 MED ORDER — POTASSIUM CHLORIDE CRYS ER 20 MEQ PO TBCR
40.0000 meq | EXTENDED_RELEASE_TABLET | Freq: Once | ORAL | Status: AC
  Administered 2017-07-03: 40 meq via ORAL
  Filled 2017-07-03: qty 2

## 2017-07-03 MED ORDER — AMLODIPINE BESYLATE 2.5 MG PO TABS: 2.5000 mg | ORAL_TABLET | Freq: Every day | ORAL | Status: DC

## 2017-07-03 MED ORDER — HEPARIN (PORCINE) IN NACL 2-0.9 UNIT/ML-% IJ SOLN
INTRAMUSCULAR | Status: AC
  Filled 2017-07-03: qty 1500

## 2017-07-03 MED ORDER — LIDOCAINE HCL (PF) 1 % IJ SOLN
INTRAMUSCULAR | Status: DC | PRN
  Administered 2017-07-03: 2 mL
  Administered 2017-07-03: 15 mL

## 2017-07-03 MED ORDER — MIDAZOLAM HCL 2 MG/2ML IJ SOLN
INTRAMUSCULAR | Status: AC
  Filled 2017-07-03: qty 2

## 2017-07-03 MED ORDER — SODIUM CHLORIDE 0.9 % IV SOLN: INTRAVENOUS | Status: AC

## 2017-07-03 MED ORDER — SODIUM CHLORIDE 0.9% FLUSH: 3.0000 mL | Freq: Two times a day (BID) | INTRAVENOUS | Status: DC

## 2017-07-03 MED ORDER — VERAPAMIL HCL 2.5 MG/ML IV SOLN
INTRAVENOUS | Status: DC | PRN
  Administered 2017-07-03: 09:00:00 via INTRA_ARTERIAL

## 2017-07-03 MED ORDER — VERAPAMIL HCL 2.5 MG/ML IV SOLN
INTRAVENOUS | Status: AC
  Filled 2017-07-03: qty 2

## 2017-07-03 MED ORDER — FENTANYL CITRATE (PF) 100 MCG/2ML IJ SOLN
INTRAMUSCULAR | Status: DC | PRN
  Administered 2017-07-03: 25 ug via INTRAVENOUS

## 2017-07-03 MED ORDER — HEPARIN (PORCINE) IN NACL 2-0.9 UNIT/ML-% IJ SOLN
INTRAMUSCULAR | Status: AC | PRN
  Administered 2017-07-03: 1000 mL

## 2017-07-03 MED ORDER — IOPAMIDOL (ISOVUE-370) INJECTION 76%
INTRAVENOUS | Status: AC
  Filled 2017-07-03: qty 100

## 2017-07-03 MED ORDER — CARVEDILOL 6.25 MG PO TABS: 6.2500 mg | ORAL_TABLET | Freq: Two times a day (BID) | ORAL | 3 refills | Status: DC

## 2017-07-03 MED ORDER — SODIUM CHLORIDE 0.9 % IV SOLN: 250.0000 mL | INTRAVENOUS | Status: DC | PRN

## 2017-07-03 MED ORDER — AMLODIPINE BESYLATE 2.5 MG PO TABS: 2.5000 mg | ORAL_TABLET | Freq: Every day | ORAL | 3 refills | Status: DC

## 2017-07-03 MED ORDER — HEPARIN SODIUM (PORCINE) 1000 UNIT/ML IJ SOLN
INTRAMUSCULAR | Status: AC
  Filled 2017-07-03: qty 1

## 2017-07-03 MED ORDER — HEPARIN SODIUM (PORCINE) 1000 UNIT/ML IJ SOLN
INTRAMUSCULAR | Status: DC | PRN
  Administered 2017-07-03: 4000 [IU] via INTRAVENOUS

## 2017-07-03 MED ORDER — MIDAZOLAM HCL 2 MG/2ML IJ SOLN
INTRAMUSCULAR | Status: DC | PRN
  Administered 2017-07-03: 1 mg via INTRAVENOUS

## 2017-07-03 MED ORDER — SODIUM CHLORIDE 0.9% FLUSH: 3.0000 mL | INTRAVENOUS | Status: DC | PRN

## 2017-07-03 MED ORDER — FENTANYL CITRATE (PF) 100 MCG/2ML IJ SOLN
INTRAMUSCULAR | Status: AC
  Filled 2017-07-03: qty 2

## 2017-07-03 MED ORDER — IOPAMIDOL (ISOVUE-370) INJECTION 76%
INTRAVENOUS | Status: DC | PRN
  Administered 2017-07-03: 110 mL via INTRA_ARTERIAL

## 2017-07-03 MED ORDER — IOPAMIDOL (ISOVUE-370) INJECTION 76%
INTRAVENOUS | Status: AC
  Filled 2017-07-03: qty 50

## 2017-07-03 MED ORDER — TRAZODONE HCL 50 MG PO TABS
50.0000 mg | ORAL_TABLET | Freq: Once | ORAL | Status: AC
  Administered 2017-07-03: 50 mg via ORAL
  Filled 2017-07-03: qty 1

## 2017-07-03 MED ORDER — CARVEDILOL 6.25 MG PO TABS
6.2500 mg | ORAL_TABLET | Freq: Two times a day (BID) | ORAL | Status: DC
  Filled 2017-07-03: qty 1

## 2017-07-03 MED ORDER — LIDOCAINE HCL (PF) 1 % IJ SOLN
INTRAMUSCULAR | Status: AC
  Filled 2017-07-03: qty 30

## 2017-07-03 SURGICAL SUPPLY — 14 items
CATH 5FR JL3.5 JR4 ANG PIG MP (CATHETERS) ×1 IMPLANT
CATH INFINITI 5 FR 3DRC (CATHETERS) ×1 IMPLANT
CATH INFINITI 5FR JL4 (CATHETERS) ×1 IMPLANT
DEVICE RAD COMP TR BAND LRG (VASCULAR PRODUCTS) ×1 IMPLANT
GLIDESHEATH SLEND SS 6F .021 (SHEATH) ×1 IMPLANT
GUIDEWIRE INQWIRE 1.5J.035X260 (WIRE) IMPLANT
INQWIRE 1.5J .035X260CM (WIRE) ×2
KIT HEART LEFT (KITS) ×2 IMPLANT
PACK CARDIAC CATHETERIZATION (CUSTOM PROCEDURE TRAY) ×2 IMPLANT
SHEATH PINNACLE 5F 10CM (SHEATH) ×1 IMPLANT
SYR MEDRAD MARK V 150ML (SYRINGE) ×2 IMPLANT
TRANSDUCER W/STOPCOCK (MISCELLANEOUS) ×2 IMPLANT
TUBING CIL FLEX 10 FLL-RA (TUBING) ×2 IMPLANT
WIRE EMERALD 3MM-J .035X150CM (WIRE) ×1 IMPLANT

## 2017-07-03 NOTE — Progress Notes (Signed)
Pt reports she is unable to sleep after 5mg  Ambien PO. Dr. Kenton Kingfisher called.

## 2017-07-03 NOTE — Progress Notes (Signed)
Progress Note  Patient Name: [name] Date of Encounter: 07/03/2017  Primary Cardiologist: Bronson Ing  Subjective   Seen post cath, just finished with hemostasis from R groin access. No hematoma. Unable to cath via radial approach. Found to have CTO of RCA with L-R collaterals. Normal LV function. No angina.  Inpatient Medications    Scheduled Meds: . [MAR Hold] aspirin  81 mg Oral Daily  . [MAR Hold] diltiazem  120 mg Oral Daily  . [MAR Hold] docusate sodium  100 mg Oral BID  . [MAR Hold] DULoxetine  30 mg Oral Daily  . [MAR Hold] isosorbide mononitrate  60 mg Oral Daily  . [MAR Hold] pantoprazole  40 mg Oral Daily  . [MAR Hold] saccharomyces boulardii  250 mg Oral BID  . sodium chloride flush  3 mL Intravenous Q12H  . [MAR Hold] zolpidem  5 mg Oral QHS   Continuous Infusions: . sodium chloride    . sodium chloride 75 mL/hr at 07/03/17 1022  . sodium chloride 1 mL/kg/hr (07/03/17 0654)  . heparin Stopped (07/03/17 0850)   PRN Meds: sodium chloride, [MAR Hold] HYDROcodone-acetaminophen, [MAR Hold]  morphine injection, [MAR Hold] ondansetron **OR** [MAR Hold] ondansetron (ZOFRAN) IV, sodium chloride flush   Vital Signs    Vitals:   07/03/17 1040 07/03/17 1045 07/03/17 1050 07/03/17 1055  BP: 102/66 109/63 112/62 (!) 102/50  Pulse: 74 72 74 76  Resp: 14 13 15 14   Temp:      TempSrc:      SpO2: 96% 98% 96% 96%  Weight:      Height:        Intake/Output Summary (Last 24 hours) at 07/03/17 1117 Last data filed at 07/03/17 0625  Gross per 24 hour  Intake              240 ml  Output              700 ml  Net             -460 ml   Filed Weights   07/01/17 1344 07/02/17 2100  Weight: 180 lb (81.6 kg) 179 lb 3.7 oz (81.3 kg)    Telemetry    NSR - Personally Reviewed  ECG    NSR, small inferior Q waves, minimal T wave changes - Personally Reviewed  Physical Exam  Relaxed, lying fully supine GEN: No acute distress.   Neck: No JVD Cardiac: RRR, no  murmurs, rubs, or gallops.  Respiratory: Clear to auscultation bilaterally. GI: Soft, nontender, non-distended  MS: No edema; No deformity. Neuro:  Nonfocal  Psych: Normal affect   Labs    Chemistry Recent Labs Lab 07/01/17 1345  NA 137  K 3.2*  CL 99*  CO2 28  GLUCOSE 107*  BUN 15  CREATININE 0.83  CALCIUM 9.5  GFRNONAA >60  GFRAA >60  ANIONGAP 10     Hematology Recent Labs Lab 07/01/17 1345 07/02/17 0630 07/03/17 0146  WBC 6.5 7.5 9.4  RBC 4.87 4.41 4.46  HGB 14.6 13.3 12.9  HCT 43.0 39.2 38.6  MCV 88.3 88.9 86.5  MCH 30.0 30.2 28.9  MCHC 34.0 33.9 33.4  RDW 13.2 13.5 13.2  PLT 346 318 316    Cardiac Enzymes Recent Labs Lab 07/01/17 1345 07/02/17 0016 07/02/17 0630 07/02/17 1345  TROPONINI <0.03 <0.03 <0.03 <0.03   No results for input(s): TROPIPOC in the last 168 hours.   BNPNo results for input(s): BNP, PROBNP in the last 168 hours.  DDimer  Recent Labs Lab 07/01/17 1317  DDIMER 0.99*     Radiology    Dg Chest 2 View  Result Date: 07/01/2017 CLINICAL DATA:  CHEST PAIN, Chest pain that started today while walking up hill. Took 1 nitro and 2 baby asa with relief. HISTORY OF CAD, HTN, MI, PATIENT STATES " MI IN 2012, CARDIAC CATH EXAM: CHEST  2 VIEW COMPARISON:  10/27/2016 FINDINGS: The heart size and mediastinal contours are within normal limits. Both lungs are clear. Degenerative changes are seen in thoracic spine. Remote right rib fractures. IMPRESSION: No evidence for acute cardiopulmonary abnormality. Electronically Signed   By: Nolon Nations M.D.   On: 07/01/2017 14:20   Ct Angio Chest Pe W And/or Wo Contrast  Result Date: 07/01/2017 CLINICAL DATA:  Chest pain that began while walking up a hill today. EXAM: CT ANGIOGRAPHY CHEST WITH CONTRAST TECHNIQUE: Multidetector CT imaging of the chest was performed using the standard protocol during bolus administration of intravenous contrast. Multiplanar CT image reconstructions and MIPs were  obtained to evaluate the vascular anatomy. CONTRAST:  100 cc Isovue 370. COMPARISON:  CT chest 04/10/2016. PA and lateral chest earlier today. FINDINGS: Cardiovascular: No pulmonary embolus is identified. Heart size is upper normal. No pericardial effusion. Tiny amount of aortic atherosclerosis noted. Mediastinum/Nodes: No lymphadenopathy. Small hiatal hernia noted. Thyroid gland appears normal. Lungs/Pleura: No pleural effusion.  Lungs are clear. Upper Abdomen: Cysts in the upper pole of the right kidney are noted. Musculoskeletal: No acute or focal bony abnormality. Review of the MIP images confirms the above findings. IMPRESSION: Negative for pulmonary embolus. No acute disease or finding to explain the patient's symptoms. Mild Aortic Atherosclerosis (ICD10-I70.0). Electronically Signed   By: Inge Rise M.D.   On: 07/01/2017 17:01    Cardiac Studies   CATH 07/03/2017  Ost RCA to Prox RCA lesion, 40 %stenosed.  Mid RCA to Dist RCA lesion, 100 %stenosed.  Prox RCA to Mid RCA lesion, 80 %stenosed.  Ost LAD to Prox LAD lesion, 30 %stenosed.  The left ventricular systolic function is normal.  LV end diastolic pressure is normal.  The left ventricular ejection fraction is 55-65% by visual estimate.  There is no mitral valve regurgitation.   1. Severe single vessel CAD  2. The RCA has diffuse mild disease prior to the proximal stent. The proximal to mid stented segment is patent with severe stenosis in the distal segment of the stent. The mid to distal vessel is occluded prior to the distal stent. The distal branches fill from left to right collaterals.  3. Mild non-obstructive disease in the proximal to mid LAD 4. Patent Circumflex with no obstructive disease.  5. Normal LV systolic function  Recommendations: Medical management of CAD for now. I have reviewed her case with Dr. Martinique and discussed options for potential CTO PCI. He feels that she would be a candidate for PCI of the CTO  if she has angina on optimal medical therapy.    Patient Profile     61 y.o. female with CTO-RCA due to in stent restenosis, preserved LV function.  Assessment & Plan    If no postprocedural complications, DC today and f/u with Dr. Martinique to discuss PCI for CTO-RCA. There was concern for coronary vasospasm, but she may have had restenosis all along. Meanwhile, will try to get on maximal medical antianginal therapy: start a beta blocker (since there is concern for vasospasm use carvedilol 6.25 mg BID), continue long acting nitrates, replace diltiazem with amlodipine (  to avoid excessive bradycardia with beta blocker). Since lisinopril and HCTZ do not offer antianginal protection will stop them , which will allow titration of other meds  Signed, Sanda Klein, MD  07/03/2017, 11:17 AM

## 2017-07-03 NOTE — Progress Notes (Signed)
Unable to reach PA Fabian Sharp earlier as she had called unit and wanted return call. Noted progress notes about going home today. Pt cath sites both at level 0, has walked in hall this evening tolerating well. Progress notes indicate pt having med changes for home, treating occlusion medically. Pt has not been informed of the changes in her meds by MD or PA post cath.  Not seen by MD or PA since cath. Call placed to Cecilie Kicks, NP on call for cardiology, she will come up to see pt and discuss changes in meds and write discharge orders to go home as soon as she can.  Pt informed of NP coming as soon as she can to go over meds and write discharge.

## 2017-07-03 NOTE — Care Management Note (Signed)
Case Management Note  Patient Details  Name: [name] MRN: [id number] Date of Birth: [birth year]  Subjective/Objective:    From home with spouse, s/p heart cath , with severe single vessel disease, recs medical management.                 Action/Plan: NCM will follow for dc needs.   Expected Discharge Date:                  Expected Discharge Plan:  Home/Self Care  In-House Referral:     Discharge planning Services  CM Consult  Post Acute Care Choice:    Choice offered to:     DME Arranged:    DME Agency:     HH Arranged:    [place] Agency:     Status of Service:  Completed, signed off  If discussed at [employer] of Stay Meetings, dates discussed:    Additional Comments:  Zenon Mayo, RN 07/03/2017, 3:38 PM

## 2017-07-03 NOTE — Telephone Encounter (Signed)
TCM Phone Call .Marland Kitchen Appt is on 07/16/17 at 3:20w/ Dr. Martinique . Please call .Marland Kitchen Thanks

## 2017-07-03 NOTE — Interval H&P Note (Signed)
History and Physical Interval Note:  07/03/2017 9:05 AM  [name]  has presented today for cardiac cath with the diagnosis of CAD/unstable angina. The various methods of treatment have been discussed with the patient and family. After consideration of risks, benefits and other options for treatment, the patient has consented to  Procedure(s): LEFT HEART CATH AND CORONARY ANGIOGRAPHY (N/A) as a surgical intervention .  The patient's history has been reviewed, patient examined, no change in status, stable for surgery.  I have reviewed the patient's chart and labs.  Questions were answered to the patient's satisfaction.    Cath Lab Visit (complete for each Cath Lab visit)  Clinical Evaluation Leading to the Procedure:   ACS: No.  Non-ACS:    Anginal Classification: CCS III  Anti-ischemic medical therapy: Maximal Therapy (2 or more classes of medications)  Non-Invasive Test Results: No non-invasive testing performed  Prior CABG: No previous CABG         Lauree Chandler

## 2017-07-03 NOTE — Discharge Summary (Signed)
Discharge Summary    Patient ID: [name],  MRN: [id number], DOB/AGE: 04-Mar-1956 61 y.o.  Admit date: 07/01/2017 Discharge date: 07/03/2017   Primary Care Provider: Rory [name] Primary Cardiologist: Dr. Bronson [name]  Discharge Diagnoses    Principal Problem:   Chest pain Active Problems:   CAD (coronary artery disease)   HTN (hypertension)   Hyperlipidemia   Obesity   GERD (gastroesophageal reflux disease)   Chronic total occlusion of coronary artery   Allergies Allergies  Allergen Reactions  . Ancef [Cefazolin] Itching    Itching around mouth - sx resolved quickly after administration of benadryl; with no further sx  . Ciprofloxacin Hcl     unknown  . Metaxalone     unknown  . Sulfa Antibiotics Itching, Rash and Other (See Comments)    "severe muscle cramps, tongue cracked, dry mouth "     History of Present Illness     [name] is a 61 y.o. female with a hx of CAD who is being seen 07/02/17 for the evaluation of chest pain. [name] is a 61 y.o. female with a history of IMI and stents done in 2012. Had early repeat cath 2 weeks latter and stents patent with no significant left sided disease. ? Spasm and on calcium blockers EF normal Last myovue December 2012 normal 2 months ago had SSCP relieved with nitro . Has severe SSCP associated with diaphoresis and dyspnea Relieved with nitro. Pain radiated to right jaw and shoulder Not recurrent since admission No acute ECG changes just low voltage CTA negative for PE done for positive d dimer.   Hospital Course     Consultants: None  [name] was transferred from [facility] to [facility] for heart catheterization. Left heart cath 07/03/17 revealed chronic total occlusion of RCA. Films will be reviewed and she will follow up with Dr. Martinique outpatient for consideration of CTO procedure. Access was difficult via right radial, she was cathed via right groin.  There was initial concern for vasospasm; however, symptoms  are likely due to CTO. Diltiazem was D/C'ed in favor of norvasc as a second anti-anginal. Imdur was continued. Coreg was added as she was not on a beta blocker and initial concern for vasospasm. ASA and repatha continued. It is unclear why she is on lasix at home, this was continued. Evaluate at outpatient visit. Lisinopril-HCTZ were held for marginal BP and titration of other meds.. May assess the need for these as outpatient.   Patient seen and examined by Dr. Sallyanne [name] today and was stable for discharge. All follow up has been arranged.  _____________  Discharge Vitals Blood pressure 110/60, pulse 81, temperature 98.2 F (36.8 C), temperature source Oral, resp. rate 19, height 5\' 2"  (1.575 m), weight 179 lb 3.7 oz (81.3 kg), SpO2 97 %.  Filed Weights   07/01/17 1344 07/02/17 2100  Weight: 180 lb (81.6 kg) 179 lb 3.7 oz (81.3 kg)    Labs & Radiologic Studies    CBC  Recent Labs  07/02/17 0630 07/03/17 0146  WBC 7.5 9.4  HGB 13.3 12.9  HCT 39.2 38.6  MCV 88.9 86.5  PLT 318 294   Basic Metabolic Panel  Recent Labs  07/01/17 1345  NA 137  K 3.2*  CL 99*  CO2 28  GLUCOSE 107*  BUN 15  CREATININE 0.83  CALCIUM 9.5   Liver Function Tests No results for input(s): AST, ALT, ALKPHOS, BILITOT, PROT, ALBUMIN in the last 72 hours. No  results for input(s): LIPASE, AMYLASE in the last 72 hours. Cardiac Enzymes  Recent Labs  07/02/17 0016 07/02/17 0630 07/02/17 1345  TROPONINI <0.03 <0.03 <0.03   BNP Invalid input(s): POCBNP D-Dimer  Recent Labs  07/01/17 1317  DDIMER 0.99*   Hemoglobin A1C No results for input(s): HGBA1C in the last 72 hours. Fasting Lipid Panel No results for input(s): CHOL, HDL, LDLCALC, TRIG, CHOLHDL, LDLDIRECT in the last 72 hours. Thyroid Function Tests  Recent Labs  07/02/17 0017  TSH 1.482   _____________  Dg Chest 2 View  Result Date: 07/01/2017 CLINICAL DATA:  CHEST PAIN, Chest pain that started today while walking up hill. Took  1 nitro and 2 baby asa with relief. HISTORY OF CAD, HTN, MI, PATIENT STATES " MI IN 2012, CARDIAC CATH EXAM: CHEST  2 VIEW COMPARISON:  10/27/2016 FINDINGS: The heart size and mediastinal contours are within normal limits. Both lungs are clear. Degenerative changes are seen in thoracic spine. Remote right rib fractures. IMPRESSION: No evidence for acute cardiopulmonary abnormality. Electronically Signed   By: Nolon Nations M.D.   On: 07/01/2017 14:20   Ct Angio Chest Pe W And/or Wo Contrast  Result Date: 07/01/2017 CLINICAL DATA:  Chest pain that began while walking up a hill today. EXAM: CT ANGIOGRAPHY CHEST WITH CONTRAST TECHNIQUE: Multidetector CT imaging of the chest was performed using the standard protocol during bolus administration of intravenous contrast. Multiplanar CT image reconstructions and MIPs were obtained to evaluate the vascular anatomy. CONTRAST:  100 cc Isovue 370. COMPARISON:  CT chest 04/10/2016. PA and lateral chest earlier today. FINDINGS: Cardiovascular: No pulmonary embolus is identified. Heart size is upper normal. No pericardial effusion. Tiny amount of aortic atherosclerosis noted. Mediastinum/Nodes: No lymphadenopathy. Small hiatal hernia noted. Thyroid gland appears normal. Lungs/Pleura: No pleural effusion.  Lungs are clear. Upper Abdomen: Cysts in the upper pole of the right kidney are noted. Musculoskeletal: No acute or focal bony abnormality. Review of the MIP images confirms the above findings. IMPRESSION: Negative for pulmonary embolus. No acute disease or finding to explain the patient's symptoms. Mild Aortic Atherosclerosis (ICD10-I70.0). Electronically Signed   By: Inge Rise M.D.   On: 07/01/2017 17:01     Diagnostic Studies/Procedures    Left heart cath  07/03/17:  Ost RCA to Prox RCA lesion, 40 %stenosed.  Mid RCA to Dist RCA lesion, 100 %stenosed.  Prox RCA to Mid RCA lesion, 80 %stenosed.  Ost LAD to Prox LAD lesion, 30 %stenosed.  The left  ventricular systolic function is normal.  LV end diastolic pressure is normal.  The left ventricular ejection fraction is 55-65% by visual estimate.  There is no mitral valve regurgitation.   1. Severe single vessel CAD  2. The RCA has diffuse mild disease prior to the proximal stent. The proximal to mid stented segment is patent with severe stenosis in the distal segment of the stent. The mid to distal vessel is occluded prior to the distal stent. The distal branches fill from left to right collaterals.  3. Mild non-obstructive disease in the proximal to mid LAD 4. Patent Circumflex with no obstructive disease.  5. Normal LV systolic function  Recommendations: Medical management of CAD for now. I have reviewed her case with Dr. Martinique and discussed options for potential CTO PCI. He feels that she would be a candidate for PCI of the CTO if she has angina on optimal medical therapy.    Disposition   Pt is being discharged home today in good  condition.  Follow-up Plans & Appointments    Follow-up Information    [name], Peter M, MD Follow up on 07/16/2017.   Specialty:  Cardiology Why:  3:20 for CTO evaluation, TCM Contact information: [address] [place] [address] [phone]          Discharge Instructions    Diet - low sodium heart healthy    Complete by:  As directed    Increase activity slowly    Complete by:  As directed       Discharge Medications   Current Discharge Medication List    START taking these medications   Details  amLODipine (NORVASC) 2.5 MG tablet Take 1 tablet (2.5 mg total) by mouth daily. Qty: 30 tablet, Refills: 3    carvedilol (COREG) 6.25 MG tablet Take 1 tablet (6.25 mg total) by mouth 2 (two) times daily with a meal. Qty: 60 tablet, Refills: 3      CONTINUE these medications which have NOT CHANGED   Details  aspirin 81 MG tablet Take 81 mg by mouth daily.    DULoxetine (CYMBALTA) 60 MG capsule Take 60 mg by  mouth every morning. Refills: 3    Evolocumab (REPATHA SURECLICK) 517 MG/ML SOAJ Inject 140 mg into the skin every 14 (fourteen) days. Qty: 6 pen, Refills: 3    fexofenadine (ALLEGRA) 180 MG tablet Take 180 mg by mouth daily.    furosemide (LASIX) 40 MG tablet Take 1 tablet (40 mg total) by mouth daily. Qty: 90 tablet, Refills: 3    HYDROcodone-acetaminophen (NORCO) 10-325 MG tablet Take 1 tablet by mouth 3 (three) times daily as needed for severe pain (chronic back and knee pain).    hydrocortisone (PROCTOSOL HC) 2.5 % rectal cream APPLY RECTALLY TWICE A DAY AS DIRECTED Qty: 28.35 g, Refills: 2   Associated Diagnoses: Musculoskeletal strain; Rectal pain    isosorbide mononitrate (IMDUR) 30 MG 24 hr tablet Take 1 tablet (30 mg total) by mouth daily. Qty: 90 tablet, Refills: 3    magnesium oxide (MAG-OX) 400 MG tablet Take 400 mg by mouth daily.    Melatonin 5 MG TABS Take 5 mg by mouth at bedtime.    nitroGLYCERIN (NITROSTAT) 0.4 MG SL tablet Place 1 tablet (0.4 mg total) under the tongue every 5 (five) minutes as needed for chest pain. Qty: 25 tablet, Refills: 3    omeprazole (PRILOSEC) 40 MG capsule Take 1 capsule (40 mg total) by mouth daily before breakfast. Qty: 60 capsule, Refills: 3   Associated Diagnoses: Gastroesophageal reflux disease without esophagitis    Probiotic Product (PROBIOTIC DAILY PO) Take 1 tablet by mouth daily.    zolpidem (AMBIEN) 10 MG tablet Take 10 mg by mouth at bedtime.  Refills: 3      STOP taking these medications     diltiazem (CARDIZEM CD) 120 MG 24 hr capsule      lisinopril-hydrochlorothiazide (PRINZIDE,ZESTORETIC) 10-12.5 MG tablet           Outstanding Labs/Studies   Follow up with Dr. Martinique to evaluate for CTO procedure.  Duration of Discharge Encounter   Greater than 30 minutes including physician time.  Signed, Tami Lin Castle Lamons PA-C 07/03/2017, 5:10 PM

## 2017-07-03 NOTE — H&P (View-Only) (Signed)
Cardiology Consultation:   Patient ID: [name]; [id number]; Mar 05, 1956   Admit date: 07/01/2017 Date of Consult: 07/02/2017  Primary Care Provider: Rory Percy, MD Primary Cardiologist: Bronson Ing Primary Electrophysiologist:  None    Patient Profile:   [name] is a 61 y.o. female with a hx of CAD who is being seen today for the evaluation of chest pain at the request of Dr Marin Comment.  History of Present Illness:   [name] 61 y.o. with history of IMI and stents done in 2012. Had early repeat cath 2 weeks latter and stents patent with no significant left sided disease. ? Spasm and on calcium blockers EF normal Last myovue December 2012 normal 2 months ago had SSCP relieved with nitro . Has severe SSCP associated with diaphoresis and dyspnea Relieved with nitro. Pain radiated to right jaw and shoulder Not recurrent since admission No acute ECG changes just low voltage CT no PE done for positive d dimer   Past Medical History:  Diagnosis Date  . Anxiety   . CAD (coronary artery disease)    PCI Stent RCA 03/12/11.  Cath 03/22/11 LVEF 55-60%.  RCA patent  proximal/distal stents with 30% proximal disease and 20% mid stenosis.  LCX with 30%  mid stenosis.  Questionable spasm.  . Carotid stenosis    Less than 50% 8/11  . GERD (gastroesophageal reflux disease) 03/02/2014  . H/O hiatal hernia   . HTN (hypertension)   . Hyperlipidemia   . Myocardial infarction ([place])   . Renal disorder    kidney stone    Past Surgical History:  Procedure Laterality Date  . CARDIAC SURGERY     stent placement  . CHOLECYSTECTOMY    . COLONOSCOPY N/A 04/01/2014   Procedure: COLONOSCOPY;  Surgeon: Rogene Houston, MD;  [facility];  Service: Endoscopy;  Laterality: N/A;  100  . Cyst removed from spine    . ECTOPIC PREGNANCY SURGERY    . ESOPHAGOGASTRODUODENOSCOPY N/A 04/01/2014   Procedure: ESOPHAGOGASTRODUODENOSCOPY (EGD);  Surgeon: Rogene Houston, MD;  [facility];   Service: Endoscopy;  Laterality: N/A;  100  . ESOPHAGOGASTRODUODENOSCOPY N/A 11/03/2016   Procedure: ESOPHAGOGASTRODUODENOSCOPY (EGD);  Surgeon: Rogene Houston, MD;  [facility]: [facility];  Service: Endoscopy;  Laterality: N/A;  2:45  . Heart stent     2012  x 2 stents  . Left knee     Arthroscopic  . Left wrist    . TONSILLECTOMY AND ADENOIDECTOMY    . TOTAL KNEE ARTHROPLASTY Left 09/26/2013   Procedure: TOTAL KNEE ARTHROPLASTY- LEFT;  Surgeon: [name]., MD;  Location: [facility];  Service: Orthopedics;  Laterality: Left;  . TUBAL LIGATION       Inpatient Medications: Scheduled Meds: . aspirin  81 mg Oral Daily  . diltiazem  120 mg Oral Daily  . docusate sodium  100 mg Oral BID  . DULoxetine  30 mg Oral Daily  . isosorbide mononitrate  60 mg Oral Daily  . pantoprazole  40 mg Oral Daily  . saccharomyces boulardii  250 mg Oral BID  . zolpidem  5 mg Oral QHS   Continuous Infusions: . heparin 1,000 Units/hr (07/02/17 0815)   PRN Meds: HYDROcodone-acetaminophen, morphine injection, ondansetron **OR** ondansetron (ZOFRAN) IV  Allergies:    Allergies  Allergen Reactions  . Ancef [Cefazolin] Itching    Itching around mouth - sx resolved quickly after administration of benadryl; with no further sx  . Ciprofloxacin Hcl  unknown  . Metaxalone     unknown  . Sulfa Antibiotics Itching, Rash and Other (See Comments)    "severe muscle cramps, tongue cracked, dry mouth "    Social History:   Social History   Social History  . Marital status: Married    Spouse name: N/A  . Number of children: N/A  . Years of education: N/A   Occupational History  . Not on file.   Social History Main Topics  . Smoking status: Former Smoker    Packs/day: 1.00    Years: 10.00    Types: Cigarettes    Start date: 06/27/1979    Quit date: 11/27/2010  . Smokeless tobacco: Never Used  . Alcohol use 0.0 oz/week     Comment: social  . Drug use: No  . Sexual activity: Not on file   Other  Topics Concern  . Not on file   Social History Narrative  . No narrative on file    Family History:   Family History  Problem Relation Age of Onset  . Colon cancer Father   . Heart disease Unknown   . Arthritis Unknown   . Cancer Unknown      ROS:  Please see the history of present illness.  ROS  All other ROS reviewed and negative.     Physical Exam/Data:   Vitals:   07/01/17 1900 07/01/17 2000 07/01/17 2053 07/02/17 0537  BP: 116/61 116/80 111/79 104/66  Pulse: 85 82 82 75  Resp: 13 18 18 18   Temp:   98.5 F (36.9 C) 98.1 F (36.7 C)  TempSrc:   Oral Oral  SpO2: 96% 96% 99% 95%  Weight:      Height:        Intake/Output Summary (Last 24 hours) at 07/02/17 0845 Last data filed at 07/02/17 0300  Gross per 24 hour  Intake             83.6 ml  Output                0 ml  Net             83.6 ml   Filed Weights   07/01/17 1344  Weight: 180 lb (81.6 kg)   Body mass index is 32.92 kg/m.  General:  Well nourished, well developed, in no acute distress  HEENT: normal Lymph: no adenopathy Neck: no JVD Endocrine:  No thryomegaly Vascular: No carotid bruits; FA pulses 2+ bilaterally without bruits  Cardiac:  normal S1, S2; RRR; no murmur   Lungs:  clear to auscultation bilaterally, no wheezing, rhonchi or rales  Abd: soft, nontender, no hepatomegaly  Ext: no edema Musculoskeletal:  No deformities, BUE and BLE strength normal and equal Skin: warm and dry  Neuro:  CNs 2-12 intact, no focal abnormalities noted Psych:  Normal affect   EKG:  The EKG was personally reviewed and demonstrates:  NSR low voltage  Telemetry:  Telemetry was personally reviewed and demonstrates:   NSR no arrhythmia  Relevant CV Studies: None  Laboratory Data:  Chemistry Recent Labs Lab 07/01/17 1345  NA 137  K 3.2*  CL 99*  CO2 28  GLUCOSE 107*  BUN 15  CREATININE 0.83  CALCIUM 9.5  GFRNONAA >60  GFRAA >60  ANIONGAP 10    No results for input(s): PROT, ALBUMIN, AST,  ALT, ALKPHOS, BILITOT in the last 168 hours. Hematology Recent Labs Lab 07/01/17 1345 07/02/17 0630  WBC 6.5 7.5  RBC 4.87 4.41  HGB 14.6 13.3  HCT 43.0 39.2  MCV 88.3 88.9  MCH 30.0 30.2  MCHC 34.0 33.9  RDW 13.2 13.5  PLT 346 318   Cardiac Enzymes Recent Labs Lab 07/01/17 1345 07/02/17 0016 07/02/17 0630  TROPONINI <0.03 <0.03 <0.03   No results for input(s): TROPIPOC in the last 168 hours.  BNPNo results for input(s): BNP, PROBNP in the last 168 hours.  DDimer  Recent Labs Lab 07/01/17 1317  DDIMER 0.99*    Radiology/Studies:  Dg Chest 2 View  Result Date: 07/01/2017 CLINICAL DATA:  CHEST PAIN, Chest pain that started today while walking up hill. Took 1 nitro and 2 baby asa with relief. HISTORY OF CAD, HTN, MI, PATIENT STATES " MI IN 2012, CARDIAC CATH EXAM: CHEST  2 VIEW COMPARISON:  10/27/2016 FINDINGS: The heart size and mediastinal contours are within normal limits. Both lungs are clear. Degenerative changes are seen in thoracic spine. Remote right rib fractures. IMPRESSION: No evidence for acute cardiopulmonary abnormality. Electronically Signed   By: Nolon Nations M.D.   On: 07/01/2017 14:20   Ct Angio Chest Pe W And/or Wo Contrast  Result Date: 07/01/2017 CLINICAL DATA:  Chest pain that began while walking up a hill today. EXAM: CT ANGIOGRAPHY CHEST WITH CONTRAST TECHNIQUE: Multidetector CT imaging of the chest was performed using the standard protocol during bolus administration of intravenous contrast. Multiplanar CT image reconstructions and MIPs were obtained to evaluate the vascular anatomy. CONTRAST:  100 cc Isovue 370. COMPARISON:  CT chest 04/10/2016. PA and lateral chest earlier today. FINDINGS: Cardiovascular: No pulmonary embolus is identified. Heart size is upper normal. No pericardial effusion. Tiny amount of aortic atherosclerosis noted. Mediastinum/Nodes: No lymphadenopathy. Small hiatal hernia noted. Thyroid gland appears normal. Lungs/Pleura: No  pleural effusion.  Lungs are clear. Upper Abdomen: Cysts in the upper pole of the right kidney are noted. Musculoskeletal: No acute or focal bony abnormality. Review of the MIP images confirms the above findings. IMPRESSION: Negative for pulmonary embolus. No acute disease or finding to explain the patient's symptoms. Mild Aortic Atherosclerosis (ICD10-I70.0). Electronically Signed   By: Inge Rise M.D.   On: 07/01/2017 17:01    Assessment and Plan:   1. Chest Pain:  Relieved with nitro history of RCA stents that are over 34 years old Discussed options with patient favor transfer to [facility] for cath today /tomorrow depending on schedule. Continue heparin Risks including stroke MI, bleeding and contrast reaction discussed willing to proceed. [facility] link called orders written Called [facility] at [facility] to inform of transfer      Signed, Jenkins Rouge, MD  07/02/2017 8:45 AM

## 2017-07-03 NOTE — Progress Notes (Addendum)
Site area: RFA Site Prior to Removal:  Level 0 Pressure Applied For: 25 min Manual: yes   Patient Status During Pull:  stable Post Pull Site:  Level 0 Post Pull Instructions Given:  yes Post Pull Pulses Present: palpable Dressing Applied:  tegaderm Bedrest begins @ 1100 till 1500 Comments:   

## 2017-07-04 NOTE — Telephone Encounter (Signed)
Patient contacted regarding discharge from [facility] on 07/01/2017.  Patient understands to follow up with provider Dr Martinique on 07/16/2017 at 3:20 at [facility]. Patient understands discharge instructions? Yes Patient understands medications and regiment? Yes Patient understands to bring all medications to this visit? Yes

## 2017-07-11 DIAGNOSIS — M545 Low back pain: Secondary | ICD-10-CM | POA: Diagnosis not present

## 2017-07-11 DIAGNOSIS — I208 Other forms of angina pectoris: Secondary | ICD-10-CM | POA: Diagnosis not present

## 2017-07-11 DIAGNOSIS — F330 Major depressive disorder, recurrent, mild: Secondary | ICD-10-CM | POA: Diagnosis not present

## 2017-07-11 DIAGNOSIS — M797 Fibromyalgia: Secondary | ICD-10-CM | POA: Diagnosis not present

## 2017-07-14 NOTE — Progress Notes (Signed)
Cardiology Office Note   Date:  07/16/2017   ID:  Shan, Padgett [birth year], MRN [id number]  PCP:  Rory Percy, MD  Cardiologist:  Kate Sable MD  Chief Complaint  Patient presents with  . Coronary Artery Disease      History of Present Illness: [name] is a 61 y.o. female who presents for [facility] after recent hospitalization. 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 She had 2 drug-eluting stent placements at that time in the mid and distal RCA. She had recurrent chest pain with cardiac catheterization not long after this. She had patent stents. She was suspected of having coronary spasm and has been treated with calcium channel blockers and long-acting nitroglycerin. Her ejection fraction was normal. In December of 2012 she presented to [facility] with atypical chest pain. She was admitted for observation. She ruled out for a myocardial infarction. She underwent a nuclear stress test which showed no evidence of ischemia.  More recently she was admitted on 07/01/17 with acute chest pain. She states she was walking up an incline in her yard and developed acute pain in right chest, jaw, and arm. She had associated diaphoresis and dyspnea. She ruled out for MI and chest pain resolved. D dimer was elevated but CT was negative for PE. She underwent cardiac cath with findings of CTO of the RCA. Medications were adjusted. She was continued on Imdur. Cardizem was switched to amlodipine, Coreg was added. She was felt to be a candidate for CTO PCI if symptoms persisted.   On follow up today she states she is doing well. She has resumed normal activities including water aerobics. She has walked fair amount shopping. She denies any recurrent chest pain, dyspnea, arm or jaw pain. She is tolerating her medication well. She does note higher dose of Cymbalta makes her jittery. She is statin intolerant and now on  Repatha.   Past Medical History:  Diagnosis Date  . Anxiety   . CAD (coronary artery disease)    PCI Stent RCA 03/12/11. Cath 03/22/11 LVEF 55-60%. RCA patent  proximal/distal stents with 30% proximal disease and 20% mid stenosis. LCX with 30% mid stenosis. Questionable spasm. Cath 07/03/17 CTO of RCA  . Carotid stenosis    Less than 50% 8/11  . Chronic total occlusion of coronary artery 07/03/2017   CTO of RCA  . GERD (gastroesophageal reflux disease) 03/02/2014  . H/O hiatal hernia   . HTN (hypertension)   . Hyperlipidemia   . Myocardial infarction ([place])   . Renal disorder    kidney stone    Past Surgical History:  Procedure Laterality Date  . CARDIAC SURGERY     stent placement  . CHOLECYSTECTOMY    . COLONOSCOPY N/A 04/01/2014   Procedure: COLONOSCOPY;  Surgeon: Rogene [place], MD;  [facility];  Service: Endoscopy;  Laterality: N/A;  100  . Cyst removed from spine    . ECTOPIC PREGNANCY SURGERY    . ESOPHAGOGASTRODUODENOSCOPY N/A 04/01/2014   Procedure: ESOPHAGOGASTRODUODENOSCOPY (EGD);  Surgeon: Rogene [place], MD;  [facility];  Service: Endoscopy;  Laterality: N/A;  100  . ESOPHAGOGASTRODUODENOSCOPY N/A 11/03/2016   Procedure: ESOPHAGOGASTRODUODENOSCOPY (EGD);  Surgeon: Rogene [place], MD;  [facility];  Service: Endoscopy;  Laterality: N/A;  2:45  . Heart stent     2012  x 2 stents  . LEFT HEART CATH AND CORONARY ANGIOGRAPHY  N/A 07/03/2017   Procedure: LEFT HEART CATH AND CORONARY ANGIOGRAPHY;  Surgeon: Burnell Blanks, MD;  Location: [place] [facility];  Service: Cardiovascular;  Laterality: N/A;  . Left knee     Arthroscopic  . Left wrist    . TONSILLECTOMY AND ADENOIDECTOMY    . TOTAL KNEE ARTHROPLASTY Left 09/26/2013   Procedure: TOTAL KNEE ARTHROPLASTY- LEFT;  Surgeon: [name]., MD;  Location: [place];  Service: Orthopedics;  Laterality: Left;  . TUBAL LIGATION       Current Outpatient Prescriptions  Medication Sig  Dispense Refill  . amLODipine (NORVASC) 2.5 MG tablet Take 1 tablet (2.5 mg total) by mouth daily. 30 tablet 3  . aspirin 81 MG tablet Take 81 mg by mouth daily.    . carvedilol (COREG) 6.25 MG tablet Take 1 tablet (6.25 mg total) by mouth 2 (two) times daily with a meal. 60 tablet 3  . DULoxetine (CYMBALTA) 60 MG capsule Take by mouth. Takes 90 mg daily  3  . Evolocumab (REPATHA SURECLICK) 454 MG/ML SOAJ Inject 140 mg into the skin every 14 (fourteen) days. 6 pen 3  . fexofenadine (ALLEGRA) 180 MG tablet Take 180 mg by mouth daily.    . furosemide (LASIX) 40 MG tablet Take 1 tablet (40 mg total) by mouth daily. 90 tablet 3  . hydrocortisone (PROCTOSOL HC) 2.5 % rectal cream APPLY RECTALLY TWICE A DAY AS DIRECTED (Patient taking differently: APPLY RECTALLY TWICE A DAY AS DIRECTED AS NEEDED FOR HEMORRHOIDS) 28.35 g 2  . isosorbide mononitrate (IMDUR) 30 MG 24 hr tablet Take 1 tablet (30 mg total) by mouth daily. 90 tablet 3  . magnesium oxide (MAG-OX) 400 MG tablet Take 400 mg by mouth daily.    . Magnesium-Potassium (KREBS MAGNESIUM-POTASSIUM) 250-100 MG TABS Take 1 tablet by mouth daily.    . Melatonin 10 MG TABS Take 1 tablet by mouth at bedtime.     . nitroGLYCERIN (NITROSTAT) 0.4 MG SL tablet Place 1 tablet (0.4 mg total) under the tongue every 5 (five) minutes as needed for chest pain. 25 tablet 3  . omeprazole (PRILOSEC) 40 MG capsule Take 1 capsule (40 mg total) by mouth daily before breakfast. 60 capsule 3  . Probiotic Product (PROBIOTIC DAILY PO) Take 1 tablet by mouth daily.    Marland Kitchen zolpidem (AMBIEN) 10 MG tablet Take 10 mg by mouth at bedtime.   3   No current facility-administered medications for this visit.     Allergies:   Ancef [cefazolin]; Ciprofloxacin hcl; Metaxalone; and Sulfa antibiotics    Social History:  The patient  reports that she quit smoking about 6 years ago. Her smoking use included Cigarettes. She started smoking about 38 years ago. She has a 10.00 pack-year  smoking history. She has never used smokeless tobacco. She reports that she drinks alcohol. She reports that she does not use drugs.   Family History:  The patient's family history includes Arthritis in her unknown relative; Cancer in her unknown relative; Colon cancer in her father; Heart disease in her unknown relative.    ROS:  Please see the history of present illness.   Otherwise, review of systems are positive for none.   All other systems are reviewed and negative.    PHYSICAL EXAM: VS:  BP (!) 141/91   Pulse 77   Ht 5' 1.5" (1.562 m)   Wt 182 lb 3.2 oz (82.6 kg)   SpO2 96%   BMI 33.87 kg/m  ,  BMI Body mass index is 33.87 kg/m. GEN: Well nourished, overweight, in no acute distress  HEENT: normal  Neck: no JVD, carotid bruits, or masses Cardiac: RRR; no murmurs, rubs, or gallops,no edema  Respiratory:  clear to auscultation bilaterally, normal work of breathing GI: soft, nontender, nondistended, + BS MS: no deformity or atrophy. No groin hematoma. Skin: warm and dry, no rash Neuro:  Strength and sensation are intact Psych: euthymic mood, full affect   EKG:  EKG is not ordered today. The ekg ordered today demonstrates N/A   Recent Labs: 07/19/2016: ALT 25 07/02/2017: TSH 1.482 07/03/2017: BUN 11; Creatinine, Ser 0.96; Hemoglobin 12.9; Platelets 316; Potassium 3.7; Sodium 138    Lipid Panel    Component Value Date/Time   CHOL (H) 03/13/2011 0530    231        ATP III CLASSIFICATION:  <200     mg/dL   Desirable  200-239  mg/dL   Borderline High  >=240    mg/dL   High          TRIG 212 (H) 03/13/2011 0530   HDL 48 03/13/2011 0530   CHOLHDL 4.8 03/13/2011 0530   VLDL 42 (H) 03/13/2011 0530   LDLCALC (H) 03/13/2011 0530    141        Total Cholesterol/HDL:CHD Risk Coronary Heart Disease Risk Table                     Men   Women  1/2 Average Risk   3.4   3.3  Average Risk       5.0   4.4  2 X Average Risk   9.6   7.1  3 X Average Risk  23.4   11.0        Use  the calculated Patient Ratio above and the CHD Risk Table to determine the patient's CHD Risk.        ATP III CLASSIFICATION (LDL):  <100     mg/dL   Optimal  100-129  mg/dL   Near or Above                    Optimal  130-159  mg/dL   Borderline  160-189  mg/dL   High  >190     mg/dL   Very High      Wt Readings from Last 3 Encounters:  07/16/17 182 lb 3.2 oz (82.6 kg)  07/02/17 179 lb 3.7 oz (81.3 kg)  11/03/16 179 lb (81.2 kg)      Other studies Reviewed: Additional studies/ records that were reviewed today include: labs dated 12/08/16 showed cholesterol 173, triglycerides 170, HDL 55, LDL 84.   Procedures   LEFT HEART CATH AND CORONARY ANGIOGRAPHY  Conclusion     Ost RCA to Prox RCA lesion, 40 %stenosed.  Mid RCA to Dist RCA lesion, 100 %stenosed.  Prox RCA to Mid RCA lesion, 80 %stenosed.  Ost LAD to Prox LAD lesion, 30 %stenosed.  The left ventricular systolic function is normal.  LV end diastolic pressure is normal.  The left ventricular ejection fraction is 55-65% by visual estimate.  There is no mitral valve regurgitation.   1. Severe single vessel CAD  2. The RCA has diffuse mild disease prior to the proximal stent. The proximal to mid stented segment is patent with severe stenosis in the distal segment of the stent. The mid to distal vessel is occluded prior to the distal stent. The distal branches fill  from left to right collaterals.  3. Mild non-obstructive disease in the proximal to mid LAD 4. Patent Circumflex with no obstructive disease.  5. Normal LV systolic function  Recommendations: Medical management of CAD for now. I have reviewed her case with Dr. Martinique and discussed options for potential CTO PCI. He feels that she would be a candidate for PCI of the CTO if she has angina on optimal medical therapy.     Indications   Coronary artery disease involving native coronary artery of native heart with unstable angina pectoris ([place]) [I25.110  (ICD-10-CM)]  Procedural Details/Technique   Technical Details Indication: 61 yo with CAD with DES prox and distal RCA in 2012 with recent chest pain with exertion. Negative troponin.   Procedure: The risks, benefits, complications, treatment options, and expected outcomes were discussed with the patient. The patient and/or family concurred with the proposed plan, giving informed consent. The patient was brought to the cath lab after IV hydration was begun. The patient was further sedated with Versed and Fentanyl. The right wrist was prepped and draped in a sterile fashion. 1% lidocaine was used for local anesthesia. Using the modified Seldinger access technique, a 5 French sheath was placed in the right radial artery. 3 mg Verapamil was given through the sheath. 4000 units IV heparin was given. Due to tortuosity in her subclavian artery, I could not torque the catheter and could not engage the RCA. The right groin was prepped and draped. 5 French sheath placed in the right femoral artery. Standard diagnostic catheters were used to perform selective coronary angiography. I engaged the RCA with a [place] catheter. A pigtail catheter was used to perform a left ventricular angiogram. The sheath was removed from the right radial artery and a Terumo hemostasis band was applied at the arteriotomy site on the right wrist.     Estimated blood loss <50 mL.  During this procedure the patient was administered the following to achieve and maintain moderate conscious sedation: Versed 1 mg, Fentanyl 25 mcg, while the patient's heart rate, blood pressure, and oxygen saturation were continuously monitored. The period of conscious sedation was 36 minutes, of which I was present face-to-face 100% of this time.    Complications   Complications documented before study signed (07/03/2017 10:28 AM EDT)    LEFT HEART CATH AND CORONARY ANGIOGRAPHY   None Documented by Burnell Blanks, MD 07/03/2017 10:17 AM EDT  Time  Range: Intra-procedure      Coronary Findings   Dominance: Right  Left Anterior Descending  Vessel is large.  Ost LAD to Prox LAD lesion, 30% stenosed.  First Diagonal Branch  Vessel is moderate in size.  First Septal Branch  Vessel is small in size.  Second Diagonal Branch  Vessel is small in size.  Second Septal Branch  Vessel is small in size.  Third Diagonal Branch  Vessel is small in size.  Third Septal Branch  Vessel is small in size.  Ramus Intermedius  Vessel is small.  Left Circumflex  Vessel is large.  First Obtuse Marginal Branch  Vessel is small in size.  Second Obtuse Marginal Branch  Vessel is small in size.  Third Obtuse Marginal Branch  Vessel is moderate in size.  Right Coronary Artery  Ost RCA to Prox RCA lesion, 40% stenosed.  Prox RCA to Mid RCA lesion, 80% stenosed. The lesion was previously treated using a drug eluting stent over 2 years ago.  Mid RCA to Dist RCA lesion, 100% stenosed. The  lesion is chronically occluded. The lesion was previously treated.  Right Posterior Descending Artery  RPDA filled by collaterals from 3rd Mrg.  Wall Motion              Left Heart   Left Ventricle The left ventricular size is normal. The left ventricular systolic function is normal. LV end diastolic pressure is normal. The left ventricular ejection fraction is 55-65% by visual estimate. No regional wall motion abnormalities. There is no evidence of mitral regurgitation.    Coronary Diagrams   Diagnostic Diagram          ASSESSMENT AND PLAN:  1.  CAD with remote stenting of the mid and distal RCA in 2012. More recent onset of angina. Cardiac cath reveals a CTO of the distal RCA stent prior to the bifurcation. The mid RCA stent is patent. There is severe diffuse disease between the stents. There are left to right collaterals. Currently she is asymptomatic on medical therapy. LV function is good. While from a technical standpoint I feel she would be a  good candidate for CTO PCI I cannot justify it at this time since her angina is well controlled on medical therapy. PCI would require stenting of the entire mid to distal RCA with some areas having 2 stent layers. At this point I have recommended she continue medical therapy and follow up with Dr. Bronson Ing. If she should develop significant angina in the future we could reconsider.  2. Hypercholesterolemia with good response to Repatha. Intolerant of statins. 3. HTN   Current medicines are reviewed at length with the patient today.  The patient does not have concerns regarding medicines.  The following changes have been made:  no change  Labs/ tests ordered today include: none No orders of the defined types were placed in this encounter.    Disposition:   FU with Dr Bronson Ing in 2-3 months  Signed, Shelia Magallon Martinique, MD  07/16/2017 3:57 PM    [facility] [address], [place], [place], [address] Phone [phone], Fax [phone]

## 2017-07-16 ENCOUNTER — Encounter: Payer: Self-pay | Admitting: Cardiology

## 2017-07-16 ENCOUNTER — Ambulatory Visit (INDEPENDENT_AMBULATORY_CARE_PROVIDER_SITE_OTHER): Payer: BLUE CROSS/BLUE SHIELD | Admitting: Cardiology

## 2017-07-16 VITALS — BP 141/91 | HR 77 | Ht 61.5 in | Wt 182.2 lb

## 2017-07-16 DIAGNOSIS — I25118 Atherosclerotic heart disease of native coronary artery with other forms of angina pectoris: Secondary | ICD-10-CM

## 2017-07-16 DIAGNOSIS — I2582 Chronic total occlusion of coronary artery: Secondary | ICD-10-CM | POA: Diagnosis not present

## 2017-07-16 DIAGNOSIS — I10 Essential (primary) hypertension: Secondary | ICD-10-CM | POA: Diagnosis not present

## 2017-07-16 DIAGNOSIS — E782 Mixed hyperlipidemia: Secondary | ICD-10-CM | POA: Diagnosis not present

## 2017-07-16 NOTE — Patient Instructions (Signed)
Continue your current therapy  We will arrange follow up with Dr. Bronson Ing in 2-3 months.

## 2017-08-21 DIAGNOSIS — Z1389 Encounter for screening for other disorder: Secondary | ICD-10-CM | POA: Diagnosis not present

## 2017-08-21 DIAGNOSIS — I10 Essential (primary) hypertension: Secondary | ICD-10-CM | POA: Diagnosis not present

## 2017-08-21 DIAGNOSIS — M545 Low back pain: Secondary | ICD-10-CM | POA: Diagnosis not present

## 2017-08-21 DIAGNOSIS — Z23 Encounter for immunization: Secondary | ICD-10-CM | POA: Diagnosis not present

## 2017-08-21 DIAGNOSIS — K219 Gastro-esophageal reflux disease without esophagitis: Secondary | ICD-10-CM | POA: Diagnosis not present

## 2017-08-21 DIAGNOSIS — E7800 Pure hypercholesterolemia, unspecified: Secondary | ICD-10-CM | POA: Diagnosis not present

## 2017-08-27 DIAGNOSIS — M9905 Segmental and somatic dysfunction of pelvic region: Secondary | ICD-10-CM | POA: Diagnosis not present

## 2017-08-27 DIAGNOSIS — M9902 Segmental and somatic dysfunction of thoracic region: Secondary | ICD-10-CM | POA: Diagnosis not present

## 2017-08-27 DIAGNOSIS — M545 Low back pain: Secondary | ICD-10-CM | POA: Diagnosis not present

## 2017-08-27 DIAGNOSIS — M9903 Segmental and somatic dysfunction of lumbar region: Secondary | ICD-10-CM | POA: Diagnosis not present

## 2017-08-29 DIAGNOSIS — M9905 Segmental and somatic dysfunction of pelvic region: Secondary | ICD-10-CM | POA: Diagnosis not present

## 2017-08-29 DIAGNOSIS — M545 Low back pain: Secondary | ICD-10-CM | POA: Diagnosis not present

## 2017-08-29 DIAGNOSIS — M9903 Segmental and somatic dysfunction of lumbar region: Secondary | ICD-10-CM | POA: Diagnosis not present

## 2017-08-29 DIAGNOSIS — M9902 Segmental and somatic dysfunction of thoracic region: Secondary | ICD-10-CM | POA: Diagnosis not present

## 2017-08-31 DIAGNOSIS — M9905 Segmental and somatic dysfunction of pelvic region: Secondary | ICD-10-CM | POA: Diagnosis not present

## 2017-08-31 DIAGNOSIS — M545 Low back pain: Secondary | ICD-10-CM | POA: Diagnosis not present

## 2017-08-31 DIAGNOSIS — M9902 Segmental and somatic dysfunction of thoracic region: Secondary | ICD-10-CM | POA: Diagnosis not present

## 2017-08-31 DIAGNOSIS — M9903 Segmental and somatic dysfunction of lumbar region: Secondary | ICD-10-CM | POA: Diagnosis not present

## 2017-09-11 ENCOUNTER — Encounter: Payer: Self-pay | Admitting: Cardiovascular Disease

## 2017-09-11 ENCOUNTER — Ambulatory Visit (INDEPENDENT_AMBULATORY_CARE_PROVIDER_SITE_OTHER): Payer: BLUE CROSS/BLUE SHIELD | Admitting: Cardiovascular Disease

## 2017-09-11 VITALS — BP 140/104 | HR 76 | Ht 62.0 in | Wt 188.0 lb

## 2017-09-11 DIAGNOSIS — I779 Disorder of arteries and arterioles, unspecified: Secondary | ICD-10-CM

## 2017-09-11 DIAGNOSIS — E782 Mixed hyperlipidemia: Secondary | ICD-10-CM | POA: Diagnosis not present

## 2017-09-11 DIAGNOSIS — I10 Essential (primary) hypertension: Secondary | ICD-10-CM | POA: Diagnosis not present

## 2017-09-11 DIAGNOSIS — I739 Peripheral vascular disease, unspecified: Secondary | ICD-10-CM

## 2017-09-11 DIAGNOSIS — R635 Abnormal weight gain: Secondary | ICD-10-CM | POA: Diagnosis not present

## 2017-09-11 DIAGNOSIS — I2582 Chronic total occlusion of coronary artery: Secondary | ICD-10-CM

## 2017-09-11 DIAGNOSIS — T50905A Adverse effect of unspecified drugs, medicaments and biological substances, initial encounter: Secondary | ICD-10-CM

## 2017-09-11 DIAGNOSIS — I25118 Atherosclerotic heart disease of native coronary artery with other forms of angina pectoris: Secondary | ICD-10-CM | POA: Diagnosis not present

## 2017-09-11 MED ORDER — CARVEDILOL 6.25 MG PO TABS: 6.2500 mg | ORAL_TABLET | Freq: Two times a day (BID) | ORAL | 3 refills | Status: DC

## 2017-09-11 MED ORDER — NITROGLYCERIN 0.4 MG SL SUBL: 0.4000 mg | SUBLINGUAL_TABLET | SUBLINGUAL | 3 refills | Status: DC | PRN

## 2017-09-11 MED ORDER — AMLODIPINE BESYLATE 5 MG PO TABS: 5.0000 mg | ORAL_TABLET | Freq: Every day | ORAL | 3 refills | Status: DC

## 2017-09-11 MED ORDER — FUROSEMIDE 40 MG PO TABS: 40.0000 mg | ORAL_TABLET | ORAL | Status: DC | PRN

## 2017-09-11 NOTE — Patient Instructions (Signed)
Medication Instructions:   Change your Lasix to as needed.  Stop Coreg (Carvedilol).  Increase Amlodipine to 5mg  daily.  Continue all other medications.    Labwork: none  Testing/Procedures: none  Follow-Up: 2 months   Any Other Special Instructions Will Be Listed Below (If Applicable).  If you need a refill on your cardiac medications before your next appointment, please call your pharmacy.

## 2017-09-11 NOTE — Progress Notes (Signed)
SUBJECTIVE: The patient presents for follow-up of coronary artery disease. She underwent coronary angiography earlier this year. She saw Dr. Martinique on 07/16/17. He mentioned that while from a technical standpoint he felt she would be a good candidate for CTO PCI of the RCA, he couldn't justify it at that time as her angina was well controlled on medical therapy. He also mentioned PCI would require stenting of the entire mid to distal RCA with some areas having two stent layers.  She has not been doing well lately she says. Since starting carvedilol she has put on 12 pounds. She says prior to this she had not had any weight fluctuations in 2 years. She has chronic back pain and can no longer sleep on her right side. She has lots of difficulty sleeping and takes Ambien and melatonin. She is scheduled to see a sleep specialist next week.  She has tried eating a healthier diet. She hasn't been doing water aerobics lately because her chiropractor told her to stop temporarily.  Lipids in October 2018: HDL 64, LDL 66, trig glycerides 179, total cluster 156.   Review of Systems: As per "subjective", otherwise negative.  Allergies  Allergen Reactions  . Ancef [Cefazolin] Itching    Itching around mouth - sx resolved quickly after administration of benadryl; with no further sx  . Ciprofloxacin Hcl     unknown  . Metaxalone     unknown  . Sulfa Antibiotics Itching, Rash and Other (See Comments)    "severe muscle cramps, tongue cracked, dry mouth "    Current Outpatient Prescriptions  Medication Sig Dispense Refill  . amLODipine (NORVASC) 2.5 MG tablet Take 1 tablet (2.5 mg total) by mouth daily. 30 tablet 3  . aspirin 81 MG tablet Take 81 mg by mouth daily.    . carvedilol (COREG) 6.25 MG tablet Take 1 tablet (6.25 mg total) by mouth 2 (two) times daily with a meal. 60 tablet 3  . DULoxetine (CYMBALTA) 60 MG capsule Take by mouth. Takes 90 mg daily  3  . Evolocumab (REPATHA SURECLICK) 846  MG/ML SOAJ Inject 140 mg into the skin every 14 (fourteen) days. 6 pen 3  . fexofenadine (ALLEGRA) 180 MG tablet Take 180 mg by mouth daily.    . furosemide (LASIX) 40 MG tablet Take 1 tablet (40 mg total) by mouth daily. 90 tablet 3  . isosorbide mononitrate (IMDUR) 30 MG 24 hr tablet Take 1 tablet (30 mg total) by mouth daily. 90 tablet 3  . magnesium oxide (MAG-OX) 400 MG tablet Take 400 mg by mouth daily.    . Magnesium-Potassium (KREBS MAGNESIUM-POTASSIUM) 250-100 MG TABS Take 1 tablet by mouth daily.    . Melatonin 10 MG TABS Take 1 tablet by mouth at bedtime.     . nitroGLYCERIN (NITROSTAT) 0.4 MG SL tablet Place 1 tablet (0.4 mg total) under the tongue every 5 (five) minutes as needed for chest pain. 25 tablet 3  . pantoprazole (PROTONIX) 40 MG tablet Take 40 mg by mouth 2 (two) times daily.    . Probiotic Product (PROBIOTIC DAILY PO) Take 1 tablet by mouth daily.    . RABEprazole (ACIPHEX) 20 MG tablet Take 20 mg by mouth daily.    Marland Kitchen zolpidem (AMBIEN) 10 MG tablet Take 10 mg by mouth at bedtime.   3   No current facility-administered medications for this visit.     Past Medical History:  Diagnosis Date  . Anxiety   . CAD (  coronary artery disease)    PCI Stent RCA 03/12/11. Cath 03/22/11 LVEF 55-60%. RCA patent  proximal/distal stents with 30% proximal disease and 20% mid stenosis. LCX with 30% mid stenosis. Questionable spasm. Cath 07/03/17 CTO of RCA  . Carotid stenosis    Less than 50% 8/11  . Chronic total occlusion of coronary artery 07/03/2017   CTO of RCA  . GERD (gastroesophageal reflux disease) 03/02/2014  . H/O hiatal hernia   . HTN (hypertension)   . Hyperlipidemia   . Myocardial infarction ([place])   . Renal disorder    kidney stone    Past Surgical History:  Procedure Laterality Date  . CARDIAC SURGERY     stent placement  . CHOLECYSTECTOMY    . COLONOSCOPY N/A 04/01/2014   Procedure: COLONOSCOPY;  Surgeon: Rogene [place], MD;  [facility];  Service:  Endoscopy;  Laterality: N/A;  100  . Cyst removed from spine    . ECTOPIC PREGNANCY SURGERY    . ESOPHAGOGASTRODUODENOSCOPY N/A 04/01/2014   Procedure: ESOPHAGOGASTRODUODENOSCOPY (EGD);  Surgeon: Rogene [place], MD;  [facility];  Service: Endoscopy;  Laterality: N/A;  100  . ESOPHAGOGASTRODUODENOSCOPY N/A 11/03/2016   Procedure: ESOPHAGOGASTRODUODENOSCOPY (EGD);  Surgeon: Rogene [place], MD;  [facility];  Service: Endoscopy;  Laterality: N/A;  2:45  . Heart stent     2012  x 2 stents  . LEFT HEART CATH AND CORONARY ANGIOGRAPHY N/A 07/03/2017   Procedure: LEFT HEART CATH AND CORONARY ANGIOGRAPHY;  Surgeon: Burnell Blanks, MD;  Location: [place] [facility];  Service: Cardiovascular;  Laterality: N/A;  . Left knee     Arthroscopic  . Left wrist    . TONSILLECTOMY AND ADENOIDECTOMY    . TOTAL KNEE ARTHROPLASTY Left 09/26/2013   Procedure: TOTAL KNEE ARTHROPLASTY- LEFT;  Surgeon: [name]., MD;  Location: [place];  Service: Orthopedics;  Laterality: Left;  . TUBAL LIGATION      Social History   Social History  . Marital status: Married    Spouse name: N/A  . Number of children: N/A  . Years of education: N/A   Occupational History  . Not on file.   Social History Main Topics  . Smoking status: Former Smoker    Packs/day: 1.00    Years: 10.00    Types: Cigarettes    Start date: 06/27/1979    Quit date: 11/27/2010  . Smokeless tobacco: Never Used  . Alcohol use 0.0 oz/week     Comment: social  . Drug use: No  . Sexual activity: Not on file   Other Topics Concern  . Not on file   Social History Narrative  . No narrative on file     Vitals:   09/11/17 1400  BP: (!) 140/104  Pulse: 76  SpO2: 98%  Weight: 188 lb (85.3 kg)  Height: 5\' 2"  (1.575 m)    Wt Readings from Last 3 Encounters:  09/11/17 188 lb (85.3 kg)  07/16/17 182 lb 3.2 oz (82.6 kg)  07/02/17 179 lb 3.7 oz (81.3 kg)     PHYSICAL EXAM General: NAD HEENT:  Normal. Neck: No JVD, no thyromegaly. Lungs: Clear to auscultation bilaterally with normal respiratory effort. CV: Nondisplaced PMI.  Regular rate and rhythm, normal S1/S2, no S3/S4, no murmur. No pretibial or periankle edema.  No carotid bruit.   Abdomen: Soft, nontender, no distention.  Neurologic: Alert and oriented.  Psych: Normal affect. Skin: Normal. Musculoskeletal: No gross deformities.  ECG: Most recent ECG reviewed.   Labs: Lab Results  Component Value Date/Time   K 3.7 07/03/2017 04:25 PM   BUN 11 07/03/2017 04:25 PM   CREATININE 0.96 07/03/2017 04:25 PM   ALT 25 07/19/2016 05:38 PM   TSH 1.482 07/02/2017 12:17 AM   TSH 1.221 03/13/2011 05:30 AM   HGB 12.9 07/03/2017 01:46 AM     Lipids: Lab Results  Component Value Date/Time   LDLCALC (H) 03/13/2011 05:30 AM    141        Total Cholesterol/HDL:CHD Risk Coronary Heart Disease Risk Table                     Men   Women  1/2 Average Risk   3.4   3.3  Average Risk       5.0   4.4  2 X Average Risk   9.6   7.1  3 X Average Risk  23.4   11.0        Use the calculated Patient Ratio above and the CHD Risk Table to determine the patient's CHD Risk.        ATP III CLASSIFICATION (LDL):  <100     mg/dL   Optimal  100-129  mg/dL   Near or Above                    Optimal  130-159  mg/dL   Borderline  160-189  mg/dL   High  >190     mg/dL   Very High   CHOL (H) 03/13/2011 05:30 AM    231        ATP III CLASSIFICATION:  <200     mg/dL   Desirable  200-239  mg/dL   Borderline High  >=240    mg/dL   High          TRIG 212 (H) 03/13/2011 05:30 AM   HDL 48 03/13/2011 05:30 AM       ASSESSMENT AND PLAN:  1. CAD with h/o MI and stents: Symptomatically stable. Continue with aggressive risk reduction and current medical therapy with ASA and Imdur.  Due to weight gain, I will stop carvedilol as this appears to be the culprit. If symptoms become poorly controlled with medical therapy, CTO PCI of the RCA could  be considered. Will continue Repatha.  2. Essential hypertension: Elevated. As I am discontinuing carvedilol due to 12 pound weight gain, I will increase amlodipine to 5 mg.  3. Mixed dyslipidemia: Currently on Repatha. LDL 84 on 12/08/16.  4. Carotid stenosis: Mild by Dopplers in 04/2015 as noted above. Repeat in 2 years.  5. Bilateral leg edema: Euvolemic. I will switch Lasix to be used as needed.    Disposition: Follow up 2 months.   Kate Sable, M.D., F.A.C.C.

## 2017-09-25 DIAGNOSIS — G4700 Insomnia, unspecified: Secondary | ICD-10-CM | POA: Diagnosis not present

## 2017-09-25 DIAGNOSIS — G4739 Other sleep apnea: Secondary | ICD-10-CM | POA: Diagnosis not present

## 2017-09-25 DIAGNOSIS — G4721 Circadian rhythm sleep disorder, delayed sleep phase type: Secondary | ICD-10-CM | POA: Diagnosis not present

## 2017-10-01 DIAGNOSIS — Z23 Encounter for immunization: Secondary | ICD-10-CM | POA: Diagnosis not present

## 2017-10-01 DIAGNOSIS — G4700 Insomnia, unspecified: Secondary | ICD-10-CM | POA: Diagnosis not present

## 2017-10-01 DIAGNOSIS — R5383 Other fatigue: Secondary | ICD-10-CM | POA: Diagnosis not present

## 2017-10-03 ENCOUNTER — Ambulatory Visit (INDEPENDENT_AMBULATORY_CARE_PROVIDER_SITE_OTHER): Payer: BLUE CROSS/BLUE SHIELD | Admitting: Internal Medicine

## 2017-10-03 ENCOUNTER — Encounter (INDEPENDENT_AMBULATORY_CARE_PROVIDER_SITE_OTHER): Payer: Self-pay | Admitting: Internal Medicine

## 2017-10-03 VITALS — BP 158/80 | HR 84 | Temp 98.7°F | Ht 62.0 in | Wt 190.6 lb

## 2017-10-03 DIAGNOSIS — K219 Gastro-esophageal reflux disease without esophagitis: Secondary | ICD-10-CM

## 2017-10-03 NOTE — Progress Notes (Signed)
Subjective:    Patient ID: [name], female    DOB: February 19, 1956, 61 y.o.   MRN: [id number]  HPI Here today for f/u. Last seen ion November of 2017. EGD in December of 2017 revealed gastritis. Normal second portion of the duodenum.  She tells me she is doing good.  She says her appetite is good. She has had weight gain. Has a BM x 3 a day on average. Stools are formed. No melena or BRRB. Family hx of colon cancer in a father .   Hx of cardiac stent and maintained on ASA.    04/01/2014 EGD/Colonoscopy:Indications:Patient is 61 year old Caucasian female who has chronic GERD and intermittent double dose PPI who presents with daily epigastric pain described as burning. She has history of CAD and recent cardiac evaluation has been negative. She is undergoing diagnostic EGD followed by high-dose colonoscopy. Father had surgery for colon carcinoma at age 19. Patient was hospitalized 2 months ago at [facility] for left-sided colitis for which she is fully recovered.  Impression:  EGD findings;  Mild changes of reflux esophagitis limited to GE junction.  Small sliding hiatal hernia.  No evidence of peptic ulcer disease or gastritis.  Colonoscopy findings; Small cecal polyp ablated via cold biopsy.  No evidence of residual colitis(left-sided colitis 8 weeks).  Sigmoid colon diverticulosis with single diverticulum at hepatic flexure.  Small external hemorrhoids.  Comment; No abnormality noted to account for patient's persistent epigastric pain.  Biopsy;  Patient had small cecal polyp removed and it is sessile serrated polyp She is still having chest pain. Patient advised to take Rebaprazole 20 mg by mouth twice a day; new prescription      Review of Systems Past Medical History:  Diagnosis Date  . Anxiety   . CAD (coronary artery disease)    PCI Stent RCA 03/12/11. Cath 03/22/11 LVEF 55-60%. RCA patent  proximal/distal stents with 30% proximal disease and 20% mid stenosis.  LCX with 30% mid stenosis. Questionable spasm. Cath 07/03/17 CTO of RCA  . Carotid stenosis    Less than 50% 8/11  . Chronic total occlusion of coronary artery 07/03/2017   CTO of RCA  . GERD (gastroesophageal reflux disease) 03/02/2014  . H/O hiatal hernia   . HTN (hypertension)   . Hyperlipidemia   . Myocardial infarction ([place])   . Renal disorder    kidney stone    Past Surgical History:  Procedure Laterality Date  . CARDIAC SURGERY     stent placement  . CHOLECYSTECTOMY    . Cyst removed from spine    . ECTOPIC PREGNANCY SURGERY    . Heart stent     2012  x 2 stents  . Left knee     Arthroscopic  . Left wrist    . TONSILLECTOMY AND ADENOIDECTOMY    . TUBAL LIGATION      Allergies  Allergen Reactions  . Ancef [Cefazolin] Itching    Itching around mouth - sx resolved quickly after administration of benadryl; with no further sx  . Ciprofloxacin Hcl     unknown  . Metaxalone     unknown  . Sulfa Antibiotics Itching, Rash and Other (See Comments)    "severe muscle cramps, tongue cracked, dry mouth "    Current Outpatient Medications on File Prior to Visit  Medication Sig Dispense Refill  . amLODipine (NORVASC) 5 MG tablet Take 1 tablet (5 mg total) by mouth daily. 90 tablet 3  . aspirin 81 MG tablet Take 81  mg by mouth daily.    . DULoxetine (CYMBALTA) 60 MG capsule Take by mouth. Takes 90 mg daily  3  . Evolocumab (REPATHA SURECLICK) 672 MG/ML SOAJ Inject 140 mg into the skin every 14 (fourteen) days. 6 pen 3  . fexofenadine (ALLEGRA) 180 MG tablet Take 180 mg by mouth daily.    . furosemide (LASIX) 40 MG tablet Take 1 tablet (40 mg total) by mouth as needed for edema (leg swelling).    . isosorbide mononitrate (IMDUR) 30 MG 24 hr tablet Take 1 tablet (30 mg total) by mouth daily. 90 tablet 3  . magnesium oxide (MAG-OX) 400 MG tablet Take 400 mg by mouth daily.    . Magnesium-Potassium (KREBS MAGNESIUM-POTASSIUM) 250-100 MG TABS Take 1 tablet by mouth daily.    .  Melatonin 10 MG TABS Take 1 tablet by mouth at bedtime.     . nitroGLYCERIN (NITROSTAT) 0.4 MG SL tablet Place 1 tablet (0.4 mg total) under the tongue every 5 (five) minutes as needed for chest pain. 25 tablet 3  . pantoprazole (PROTONIX) 40 MG tablet Take 40 mg daily by mouth.     . zolpidem (AMBIEN) 10 MG tablet Take 10 mg by mouth at bedtime.   3   No current facility-administered medications on file prior to visit.         Objective:   Physical Exam Blood pressure (!) 158/80, pulse 84, temperature 98.7 F (37.1 C), height 5\' 2"  (1.575 m), weight 190 lb 9.6 oz (86.5 kg). Alert and oriented. Skin warm and dry. Oral mucosa is moist.   . Sclera anicteric, conjunctivae is pink. Thyroid not enlarged. No cervical lymphadenopathy. Lungs clear. Heart regular rate and rhythm.  Abdomen is soft. Bowel sounds are positive. No hepatomegaly. No abdominal masses felt. No tenderness.  No edema to lower extremities.           Assessment & Plan:  GERD. Continue the Protonix one a day . OV in 1 year.

## 2017-10-03 NOTE — Patient Instructions (Signed)
Continue the Protonix. OV in 1 year.  

## 2017-11-13 ENCOUNTER — Encounter: Payer: Self-pay | Admitting: *Deleted

## 2017-11-14 ENCOUNTER — Ambulatory Visit: Payer: BLUE CROSS/BLUE SHIELD | Admitting: Cardiovascular Disease

## 2017-11-14 ENCOUNTER — Encounter: Payer: Self-pay | Admitting: Cardiovascular Disease

## 2017-11-14 ENCOUNTER — Other Ambulatory Visit: Payer: Self-pay

## 2017-11-14 VITALS — BP 134/82 | HR 100 | Ht 62.0 in | Wt 187.0 lb

## 2017-11-14 DIAGNOSIS — I779 Disorder of arteries and arterioles, unspecified: Secondary | ICD-10-CM

## 2017-11-14 DIAGNOSIS — I25118 Atherosclerotic heart disease of native coronary artery with other forms of angina pectoris: Secondary | ICD-10-CM | POA: Diagnosis not present

## 2017-11-14 DIAGNOSIS — I2582 Chronic total occlusion of coronary artery: Secondary | ICD-10-CM | POA: Diagnosis not present

## 2017-11-14 DIAGNOSIS — I739 Peripheral vascular disease, unspecified: Secondary | ICD-10-CM

## 2017-11-14 DIAGNOSIS — I10 Essential (primary) hypertension: Secondary | ICD-10-CM

## 2017-11-14 DIAGNOSIS — E782 Mixed hyperlipidemia: Secondary | ICD-10-CM | POA: Diagnosis not present

## 2017-11-14 NOTE — Patient Instructions (Signed)

## 2017-11-14 NOTE — Progress Notes (Signed)
SUBJECTIVE: The patient presents for follow-up of coronary artery disease. She underwent coronary angiography earlier this year. She saw Dr. Martinique on 07/16/17. He mentioned that while from a technical standpoint he felt she would be a good candidate for CTO PCI of the RCA, he couldn't justify it at that time as her angina was well controlled on medical therapy. He also mentioned PCI would require stenting of the entire mid to distal RCA with some areas having two stent layers.  Lipids in October 2018: HDL 64, LDL 66, triglycerides 179, total cholesterol 156.  She denies chest pain, palpitations, leg swelling, and shortness of breath.  She has had some weight fluctuations recently but also tells me that her sleep schedule has been entirely off as her daughter recently gave birth and she has been staying up with the baby.  She did see a sleep specialist and was instructed in proper sleep hygiene.  She has had some afternoon headaches since recent sleep deprivation.    Review of Systems: As per "subjective", otherwise negative.  Allergies  Allergen Reactions  . Ancef [Cefazolin] Itching    Itching around mouth - sx resolved quickly after administration of benadryl; with no further sx  . Ciprofloxacin Hcl     unknown  . Metaxalone     unknown  . Sulfa Antibiotics Itching, Rash and Other (See Comments)    "severe muscle cramps, tongue cracked, dry mouth "    Current Outpatient Medications  Medication Sig Dispense Refill  . amLODipine (NORVASC) 5 MG tablet Take 1 tablet (5 mg total) by mouth daily. 90 tablet 3  . aspirin 81 MG tablet Take 81 mg by mouth daily.    . DULoxetine (CYMBALTA) 60 MG capsule Take by mouth. Takes 90 mg daily  3  . Evolocumab (REPATHA SURECLICK) 539 MG/ML SOAJ Inject 140 mg into the skin every 14 (fourteen) days. 6 pen 3  . fexofenadine (ALLEGRA) 180 MG tablet Take 180 mg by mouth daily.    . furosemide (LASIX) 40 MG tablet Take 1 tablet (40 mg total) by mouth  as needed for edema (leg swelling).    . isosorbide mononitrate (IMDUR) 30 MG 24 hr tablet Take 1 tablet (30 mg total) by mouth daily. 90 tablet 3  . magnesium oxide (MAG-OX) 400 MG tablet Take 400 mg by mouth daily.    . Melatonin 10 MG TABS Take 1 tablet by mouth at bedtime.     . nitroGLYCERIN (NITROSTAT) 0.4 MG SL tablet Place 1 tablet (0.4 mg total) under the tongue every 5 (five) minutes as needed for chest pain. 25 tablet 3  . pantoprazole (PROTONIX) 40 MG tablet Take 40 mg daily by mouth.     . zolpidem (AMBIEN) 10 MG tablet Take 10 mg by mouth at bedtime.   3   No current facility-administered medications for this visit.     Past Medical History:  Diagnosis Date  . Anxiety   . CAD (coronary artery disease)    PCI Stent RCA 03/12/11. Cath 03/22/11 LVEF 55-60%. RCA patent  proximal/distal stents with 30% proximal disease and 20% mid stenosis. LCX with 30% mid stenosis. Questionable spasm. Cath 07/03/17 CTO of RCA  . Carotid stenosis    Less than 50% 8/11  . Chronic total occlusion of coronary artery 07/03/2017   CTO of RCA  . GERD (gastroesophageal reflux disease) 03/02/2014  . H/O hiatal hernia   . HTN (hypertension)   . Hyperlipidemia   . Myocardial  infarction ([place])   . Renal disorder    kidney stone    Past Surgical History:  Procedure Laterality Date  . CARDIAC SURGERY     stent placement  . CHOLECYSTECTOMY    . COLONOSCOPY N/A 04/01/2014   Procedure: COLONOSCOPY;  Surgeon: Rogene Houston, MD;  [facility];  Service: Endoscopy;  Laterality: N/A;  100  . Cyst removed from spine    . ECTOPIC PREGNANCY SURGERY    . ESOPHAGOGASTRODUODENOSCOPY N/A 04/01/2014   Procedure: ESOPHAGOGASTRODUODENOSCOPY (EGD);  Surgeon: Rogene Houston, MD;  [facility];  Service: Endoscopy;  Laterality: N/A;  100  . ESOPHAGOGASTRODUODENOSCOPY N/A 11/03/2016   Procedure: ESOPHAGOGASTRODUODENOSCOPY (EGD);  Surgeon: Rogene Houston, MD;  [facility];  Service:  Endoscopy;  Laterality: N/A;  2:45  . Heart stent     2012  x 2 stents  . LEFT HEART CATH AND CORONARY ANGIOGRAPHY N/A 07/03/2017   Procedure: LEFT HEART CATH AND CORONARY ANGIOGRAPHY;  Surgeon: Burnell Blanks, MD;  Location: [place] [facility];  Service: Cardiovascular;  Laterality: N/A;  . Left knee     Arthroscopic  . Left wrist    . TONSILLECTOMY AND ADENOIDECTOMY    . TOTAL KNEE ARTHROPLASTY Left 09/26/2013   Procedure: TOTAL KNEE ARTHROPLASTY- LEFT;  Surgeon: [name]., MD;  Location: [place];  Service: Orthopedics;  Laterality: Left;  . TUBAL LIGATION      Social History   Socioeconomic History  . Marital status: Married    Spouse name: Not on file  . Number of children: Not on file  . Years of education: Not on file  . Highest education level: Not on file  Social Needs  . Financial resource strain: Not on file  . Food insecurity - worry: Not on file  . Food insecurity - inability: Not on file  . Transportation needs - medical: Not on file  . Transportation needs - non-medical: Not on file  Occupational History  . Not on file  Tobacco Use  . Smoking status: Former Smoker    Packs/day: 1.00    Years: 10.00    Pack years: 10.00    Types: Cigarettes    Start date: 06/27/1979    Last attempt to quit: 11/27/2010    Years since quitting: 6.9  . Smokeless tobacco: Never Used  Substance and Sexual Activity  . Alcohol use: Yes    Alcohol/week: 0.0 oz    Comment: social  . Drug use: No  . Sexual activity: Not on file  Other Topics Concern  . Not on file  Social History Narrative  . Not on file     Vitals:   11/14/17 1353  BP: 134/82  Pulse: 100  SpO2: 97%  Weight: 187 lb (84.8 kg)  Height: 5\' 2"  (1.575 m)    Wt Readings from Last 3 Encounters:  11/14/17 187 lb (84.8 kg)  10/03/17 190 lb 9.6 oz (86.5 kg)  09/11/17 188 lb (85.3 kg)     PHYSICAL EXAM General: NAD HEENT: Normal. Neck: No JVD, no thyromegaly. Lungs: Clear to auscultation  bilaterally with normal respiratory effort. CV: Regular rate and rhythm, normal S1/S2, no S3/S4, no murmur. No pretibial or periankle edema.  No carotid bruit.   Abdomen: Soft, nontender, no distention.  Neurologic: Alert and oriented.  Psych: Normal affect. Skin: Normal. Musculoskeletal: No gross deformities.    ECG: Most recent ECG reviewed.   Labs: Lab Results  Component Value Date/Time  K 3.7 07/03/2017 04:25 PM   BUN 11 07/03/2017 04:25 PM   CREATININE 0.96 07/03/2017 04:25 PM   ALT 25 07/19/2016 05:38 PM   TSH 1.482 07/02/2017 12:17 AM   TSH 1.221 03/13/2011 05:30 AM   HGB 12.9 07/03/2017 01:46 AM     Lipids: Lab Results  Component Value Date/Time   LDLCALC (H) 03/13/2011 05:30 AM    141        Total Cholesterol/HDL:CHD Risk Coronary Heart Disease Risk Table                     Men   Women  1/2 Average Risk   3.4   3.3  Average Risk       5.0   4.4  2 X Average Risk   9.6   7.1  3 X Average Risk  23.4   11.0        Use the calculated Patient Ratio above and the CHD Risk Table to determine the patient's CHD Risk.        ATP III CLASSIFICATION (LDL):  <100     mg/dL   Optimal  100-129  mg/dL   Near or Above                    Optimal  130-159  mg/dL   Borderline  160-189  mg/dL   High  >190     mg/dL   Very High   CHOL (H) 03/13/2011 05:30 AM    231        ATP III CLASSIFICATION:  <200     mg/dL   Desirable  200-239  mg/dL   Borderline High  >=240    mg/dL   High          TRIG 212 (H) 03/13/2011 05:30 AM   HDL 48 03/13/2011 05:30 AM       ASSESSMENT AND PLAN: 1. CAD with h/o MI and stents: Symptomatically stable. Continue with aggressive risk reduction and current medical therapy with ASA and Imdur.  Due to weight gain, I previously had to stop carvedilol as this appeared to be the culprit. If symptoms become poorly controlled with medical therapy, CTO PCI of the RCA could be considered. Will continue Repatha.  2. Essential hypertension:   Controlled.  No changes.  3. Mixed dyslipidemia: Currently on Repatha. LDL 84 on 12/08/16.  4. Carotid stenosis: Mild by Dopplers in 04/2015. Repeat in 2 years.  5. Bilateral leg edema: Euvolemic.  Continue Lasix to be used as needed.      Disposition: Follow up 6 months   Kate Sable, M.D., F.A.C.C.

## 2017-12-13 ENCOUNTER — Encounter: Payer: Self-pay | Admitting: *Deleted

## 2017-12-24 ENCOUNTER — Encounter: Payer: Self-pay | Admitting: Cardiovascular Disease

## 2017-12-24 ENCOUNTER — Ambulatory Visit: Payer: BLUE CROSS/BLUE SHIELD | Admitting: Cardiovascular Disease

## 2017-12-24 ENCOUNTER — Other Ambulatory Visit: Payer: Self-pay

## 2017-12-24 VITALS — BP 164/90 | HR 92 | Ht 62.0 in | Wt 187.0 lb

## 2017-12-24 DIAGNOSIS — I25118 Atherosclerotic heart disease of native coronary artery with other forms of angina pectoris: Secondary | ICD-10-CM | POA: Diagnosis not present

## 2017-12-24 DIAGNOSIS — I739 Peripheral vascular disease, unspecified: Secondary | ICD-10-CM

## 2017-12-24 DIAGNOSIS — I10 Essential (primary) hypertension: Secondary | ICD-10-CM

## 2017-12-24 DIAGNOSIS — I779 Disorder of arteries and arterioles, unspecified: Secondary | ICD-10-CM | POA: Diagnosis not present

## 2017-12-24 DIAGNOSIS — E782 Mixed hyperlipidemia: Secondary | ICD-10-CM | POA: Diagnosis not present

## 2017-12-24 DIAGNOSIS — I2582 Chronic total occlusion of coronary artery: Secondary | ICD-10-CM | POA: Diagnosis not present

## 2017-12-24 MED ORDER — AMLODIPINE BESYLATE 10 MG PO TABS: 10.0000 mg | ORAL_TABLET | Freq: Every day | ORAL | 3 refills | Status: DC

## 2017-12-24 MED ORDER — EVOLOCUMAB 140 MG/ML ~~LOC~~ SOAJ: 140.0000 mg | SUBCUTANEOUS | 3 refills | Status: DC

## 2017-12-24 NOTE — Addendum Note (Signed)
Addended by: [name] on: 12/24/2017 02:12 PM   Modules accepted: Orders

## 2017-12-24 NOTE — Patient Instructions (Signed)
Medication Instructions:   Increase Amlodipine to 10mg  daily.  Continue all other medications.    Labwork: none  Testing/Procedures: none  Follow-Up: 3 months   Any Other Special Instructions Will Be Listed Below (If Applicable).  If you need a refill on your cardiac medications before your next appointment, please call your pharmacy.

## 2017-12-24 NOTE — Progress Notes (Signed)
SUBJECTIVE: The patient presents for early follow-up.  I saw her in December 2018 and she was doing well at that time.  She underwent coronary angiography in 2018. She saw Dr. Martinique on 07/16/17. He mentioned that while from a technical standpoint he felt she would be a good candidate for CTO PCI of the RCA, he couldn't justify it at that time as her angina was well controlled on medical therapy. He also mentioned PCI would require stenting of the entire mid to distal RCA with some areas having two stent layers.  Lipids in October 2018: HDL 64,LDL 66, triglycerides 179, total cholesterol 156.  She has been having problems with high blood pressure and associated dizziness.  She denies chest pain.  She is seldom short of breath.  She has been under a lot of stress lately due to her father's illness.  Blood pressure yesterday was 157/101.  It is 164/90 today.      Review of Systems: As per "subjective", otherwise negative.  Allergies  Allergen Reactions  . Ancef [Cefazolin] Itching    Itching around mouth - sx resolved quickly after administration of benadryl; with no further sx  . Ciprofloxacin Hcl     unknown  . Metaxalone     unknown  . Sulfa Antibiotics Itching, Rash and Other (See Comments)    "severe muscle cramps, tongue cracked, dry mouth "    Current Outpatient Medications  Medication Sig Dispense Refill  . amLODipine (NORVASC) 5 MG tablet Take 1 tablet (5 mg total) by mouth daily. 90 tablet 3  . aspirin 81 MG tablet Take 81 mg by mouth daily.    . DULoxetine (CYMBALTA) 60 MG capsule Take by mouth. Takes 90 mg daily  3  . Evolocumab (REPATHA SURECLICK) 626 MG/ML SOAJ Inject 140 mg into the skin every 14 (fourteen) days. 6 pen 3  . fexofenadine (ALLEGRA) 180 MG tablet Take 180 mg by mouth daily.    . furosemide (LASIX) 40 MG tablet Take 1 tablet (40 mg total) by mouth as needed for edema (leg swelling).    . isosorbide mononitrate (IMDUR) 30 MG 24 hr tablet Take 1  tablet (30 mg total) by mouth daily. 90 tablet 3  . magnesium oxide (MAG-OX) 400 MG tablet Take 400 mg by mouth daily.    . Melatonin 10 MG TABS Take 1 tablet by mouth at bedtime.     . nitroGLYCERIN (NITROSTAT) 0.4 MG SL tablet Place 1 tablet (0.4 mg total) under the tongue every 5 (five) minutes as needed for chest pain. 25 tablet 3  . pantoprazole (PROTONIX) 40 MG tablet Take 40 mg daily by mouth.     . zolpidem (AMBIEN) 10 MG tablet Take 10 mg by mouth at bedtime.   3   No current facility-administered medications for this visit.     Past Medical History:  Diagnosis Date  . Anxiety   . CAD (coronary artery disease)    PCI Stent RCA 03/12/11. Cath 03/22/11 LVEF 55-60%. RCA patent  proximal/distal stents with 30% proximal disease and 20% mid stenosis. LCX with 30% mid stenosis. Questionable spasm. Cath 07/03/17 CTO of RCA  . Carotid stenosis    Less than 50% 8/11  . Chronic total occlusion of coronary artery 07/03/2017   CTO of RCA  . GERD (gastroesophageal reflux disease) 03/02/2014  . H/O hiatal hernia   . HTN (hypertension)   . Hyperlipidemia   . Myocardial infarction ([place])   . Renal disorder  kidney stone    Past Surgical History:  Procedure Laterality Date  . CARDIAC SURGERY     stent placement  . CHOLECYSTECTOMY    . COLONOSCOPY N/A 04/01/2014   Procedure: COLONOSCOPY;  Surgeon: Rogene Houston, MD;  [facility];  Service: Endoscopy;  Laterality: N/A;  100  . Cyst removed from spine    . ECTOPIC PREGNANCY SURGERY    . ESOPHAGOGASTRODUODENOSCOPY N/A 04/01/2014   Procedure: ESOPHAGOGASTRODUODENOSCOPY (EGD);  Surgeon: Rogene Houston, MD;  [facility];  Service: Endoscopy;  Laterality: N/A;  100  . ESOPHAGOGASTRODUODENOSCOPY N/A 11/03/2016   Procedure: ESOPHAGOGASTRODUODENOSCOPY (EGD);  Surgeon: Rogene Houston, MD;  [facility];  Service: Endoscopy;  Laterality: N/A;  2:45  . Heart stent     2012  x 2 stents  . LEFT HEART CATH AND CORONARY  ANGIOGRAPHY N/A 07/03/2017   Procedure: LEFT HEART CATH AND CORONARY ANGIOGRAPHY;  Surgeon: Burnell Blanks, MD;  Location: [place] [facility];  Service: Cardiovascular;  Laterality: N/A;  . Left knee     Arthroscopic  . Left wrist    . TONSILLECTOMY AND ADENOIDECTOMY    . TOTAL KNEE ARTHROPLASTY Left 09/26/2013   Procedure: TOTAL KNEE ARTHROPLASTY- LEFT;  Surgeon: [name]., MD;  Location: [place];  Service: Orthopedics;  Laterality: Left;  . TUBAL LIGATION      Social History   Socioeconomic History  . Marital status: Married    Spouse name: Not on file  . Number of children: Not on file  . Years of education: Not on file  . Highest education level: Not on file  Social Needs  . Financial resource strain: Not on file  . Food insecurity - worry: Not on file  . Food insecurity - inability: Not on file  . Transportation needs - medical: Not on file  . Transportation needs - non-medical: Not on file  Occupational History  . Not on file  Tobacco Use  . Smoking status: Former Smoker    Packs/day: 1.00    Years: 10.00    Pack years: 10.00    Types: Cigarettes    Start date: 06/27/1979    Last attempt to quit: 11/27/2010    Years since quitting: 7.0  . Smokeless tobacco: Never Used  Substance and Sexual Activity  . Alcohol use: Yes    Alcohol/week: 0.0 oz    Comment: social  . Drug use: No  . Sexual activity: Not on file  Other Topics Concern  . Not on file  Social History Narrative  . Not on file     Vitals:   12/24/17 1338  BP: (!) 164/90  Pulse: 92  SpO2: 98%  Weight: 187 lb (84.8 kg)  Height: 5\' 2"  (1.575 m)    Wt Readings from Last 3 Encounters:  12/24/17 187 lb (84.8 kg)  11/14/17 187 lb (84.8 kg)  10/03/17 190 lb 9.6 oz (86.5 kg)     PHYSICAL EXAM General: NAD HEENT: Normal. Neck: No JVD, no thyromegaly. Lungs: Clear to auscultation bilaterally with normal respiratory effort. CV: Regular rate and rhythm, normal S1/S2, no S3/S4, no murmur.  No pretibial or periankle edema.  No carotid bruit.   Abdomen: Soft, nontender, no distention.  Neurologic: Alert and oriented.  Psych: Normal affect. Skin: Normal. Musculoskeletal: No gross deformities.    ECG: Most recent ECG reviewed.   Labs: Lab Results  Component Value Date/Time   K 3.7 07/03/2017 04:25 PM  BUN 11 07/03/2017 04:25 PM   CREATININE 0.96 07/03/2017 04:25 PM   ALT 25 07/19/2016 05:38 PM   TSH 1.482 07/02/2017 12:17 AM   TSH 1.221 03/13/2011 05:30 AM   HGB 12.9 07/03/2017 01:46 AM     Lipids: Lab Results  Component Value Date/Time   LDLCALC (H) 03/13/2011 05:30 AM    141        Total Cholesterol/HDL:CHD Risk Coronary Heart Disease Risk Table                     Men   Women  1/2 Average Risk   3.4   3.3  Average Risk       5.0   4.4  2 X Average Risk   9.6   7.1  3 X Average Risk  23.4   11.0        Use the calculated Patient Ratio above and the CHD Risk Table to determine the patient's CHD Risk.        ATP III CLASSIFICATION (LDL):  <100     mg/dL   Optimal  100-129  mg/dL   Near or Above                    Optimal  130-159  mg/dL   Borderline  160-189  mg/dL   High  >190     mg/dL   Very High   CHOL (H) 03/13/2011 05:30 AM    231        ATP III CLASSIFICATION:  <200     mg/dL   Desirable  200-239  mg/dL   Borderline High  >=240    mg/dL   High          TRIG 212 (H) 03/13/2011 05:30 AM   HDL 48 03/13/2011 05:30 AM       ASSESSMENT AND PLAN: 1. CAD with h/o MI and stents: Symptomatically stable. Continue with aggressive risk reduction and current medical therapy with ASA and Imdur. Due to weight gain, I previously had to stop carvedilol as this appeared to be the culprit. If symptoms become poorly controlled with medical therapy, CTO PCI of the RCA could be considered.Will continue Repatha.  2. Essential hypertension:  Elevated.  I will increase amlodipine to 10 mg.  3. Mixed dyslipidemia:Currently on Repatha.LDL 84 on  12/08/16.  4. Carotid stenosis: Mild by Dopplers in 04/2015. Repeat in 2 years.  5. Bilateral leg edema: Euvolemic.  Continue Lasix to be used as needed.      Disposition: Follow up 3 months.   Kate Sable, M.D., F.A.C.C.

## 2017-12-31 ENCOUNTER — Telehealth (INDEPENDENT_AMBULATORY_CARE_PROVIDER_SITE_OTHER): Payer: Self-pay | Admitting: *Deleted

## 2017-12-31 ENCOUNTER — Telehealth (INDEPENDENT_AMBULATORY_CARE_PROVIDER_SITE_OTHER): Payer: Self-pay | Admitting: Internal Medicine

## 2017-12-31 DIAGNOSIS — K640 First degree hemorrhoids: Secondary | ICD-10-CM

## 2017-12-31 MED ORDER — HYDROCORTISONE 2.5 % RE CREA: 1.0000 "application " | TOPICAL_CREAM | Freq: Two times a day (BID) | RECTAL | 1 refills | Status: DC

## 2017-12-31 NOTE — Telephone Encounter (Signed)
Patient called left message stating she needs a refill on "proctosol-hc 2.5 mg" patient states this is a hemorrhoid cream, I did not see this in the med list. [facility] [phone]

## 2017-12-31 NOTE — Telephone Encounter (Signed)
Let her know I have sent

## 2017-12-31 NOTE — Telephone Encounter (Signed)
Rx for Proctozone sent to her pharmacy

## 2017-12-31 NOTE — Telephone Encounter (Signed)
I called patient to make her aware, no answer left a message.

## 2018-01-17 ENCOUNTER — Telehealth: Payer: Self-pay | Admitting: Cardiovascular Disease

## 2018-01-17 NOTE — Telephone Encounter (Signed)
Likely due to increase in amlodipine dose. Reduce back to 5 mg and start losartan 25 mg daily. Can take Lasix 40 mg bid x 2 days for symptom relief.

## 2018-01-17 NOTE — Telephone Encounter (Signed)
Patient called stating that she is having issue with her ankles being swollen.   She is not sure if from recent medication changes or if something else is causing the swelling

## 2018-01-17 NOTE — Telephone Encounter (Signed)
Patient has had an increase in swelling in ankles in the past week. Patient states she is taking the lasix 40 mg daily. Patient states she does elevate her feet when able. Patient has noticed about a 5 pound weight gain. Patient has had some shortness of breath associated with swelling as well. Will forward to provider for recommendation

## 2018-01-18 NOTE — Telephone Encounter (Signed)
LMTCB

## 2018-01-21 NOTE — Telephone Encounter (Signed)
Number disconnected have tried several times. LM on home phone listed in chart

## 2018-01-21 NOTE — Telephone Encounter (Signed)
Patient returned call please call [phone].

## 2018-01-22 MED ORDER — LOSARTAN POTASSIUM 25 MG PO TABS: 25.0000 mg | ORAL_TABLET | Freq: Every day | ORAL | 3 refills | Status: DC

## 2018-01-22 MED ORDER — AMLODIPINE BESYLATE 5 MG PO TABS: 5.0000 mg | ORAL_TABLET | Freq: Every day | ORAL | 3 refills | Status: DC

## 2018-01-22 NOTE — Telephone Encounter (Signed)
Patient notified- Rx sent to pharmacy. 

## 2018-02-13 DIAGNOSIS — Z6835 Body mass index (BMI) 35.0-35.9, adult: Secondary | ICD-10-CM | POA: Diagnosis not present

## 2018-02-13 DIAGNOSIS — I10 Essential (primary) hypertension: Secondary | ICD-10-CM | POA: Diagnosis not present

## 2018-02-13 DIAGNOSIS — M797 Fibromyalgia: Secondary | ICD-10-CM | POA: Diagnosis not present

## 2018-02-13 DIAGNOSIS — M545 Low back pain: Secondary | ICD-10-CM | POA: Diagnosis not present

## 2018-02-18 ENCOUNTER — Other Ambulatory Visit: Payer: Self-pay | Admitting: Cardiovascular Disease

## 2018-02-20 ENCOUNTER — Other Ambulatory Visit: Payer: Self-pay | Admitting: Cardiovascular Disease

## 2018-02-20 NOTE — Telephone Encounter (Signed)
°*  STAT* If patient is at the pharmacy, call can be transferred to refill team.   1. Which medications need to be refilled?Evolocumab (REPATHA SURECLICK) 620 MG/ML SOAJ    2. Which pharmacy/location (including street and city if local pharmacy) is medication to be sent to? [facility] in [address]  3. Do they need a 30 day or 90 day supply? [place]

## 2018-02-20 NOTE — Telephone Encounter (Signed)
Patient notified refill already sent to pharmacy in January.  She will call pharm.

## 2018-02-23 DIAGNOSIS — M79604 Pain in right leg: Secondary | ICD-10-CM | POA: Diagnosis not present

## 2018-02-23 DIAGNOSIS — Z6835 Body mass index (BMI) 35.0-35.9, adult: Secondary | ICD-10-CM | POA: Diagnosis not present

## 2018-02-25 DIAGNOSIS — M79604 Pain in right leg: Secondary | ICD-10-CM | POA: Diagnosis not present

## 2018-02-25 DIAGNOSIS — M79661 Pain in right lower leg: Secondary | ICD-10-CM | POA: Diagnosis not present

## 2018-02-25 DIAGNOSIS — R9389 Abnormal findings on diagnostic imaging of other specified body structures: Secondary | ICD-10-CM | POA: Diagnosis not present

## 2018-02-26 DIAGNOSIS — M1711 Unilateral primary osteoarthritis, right knee: Secondary | ICD-10-CM | POA: Diagnosis not present

## 2018-02-28 ENCOUNTER — Telehealth: Payer: Self-pay | Admitting: Cardiovascular Disease

## 2018-02-28 NOTE — Telephone Encounter (Signed)
CoverMyMeds called in reference to prior authorization for medication Repatha. Please call [phone]. Reference Key  [id number]

## 2018-03-04 NOTE — Telephone Encounter (Signed)
[employer] PA faxed back for authorization.

## 2018-03-06 ENCOUNTER — Telehealth: Payer: Self-pay | Admitting: *Deleted

## 2018-03-06 NOTE — Telephone Encounter (Signed)
Received fax from [facility] that Jerauld approved 03/04/2018 thru 03/04/2019.  Patient aware & faxed to [facility] on [address].

## 2018-03-08 ENCOUNTER — Other Ambulatory Visit (HOSPITAL_COMMUNITY): Payer: Self-pay | Admitting: Orthopedic Surgery

## 2018-03-08 DIAGNOSIS — M170 Bilateral primary osteoarthritis of knee: Secondary | ICD-10-CM | POA: Diagnosis not present

## 2018-03-08 DIAGNOSIS — M25561 Pain in right knee: Secondary | ICD-10-CM

## 2018-03-12 DIAGNOSIS — M797 Fibromyalgia: Secondary | ICD-10-CM | POA: Diagnosis not present

## 2018-03-12 DIAGNOSIS — M25561 Pain in right knee: Secondary | ICD-10-CM | POA: Diagnosis not present

## 2018-03-14 ENCOUNTER — Ambulatory Visit (HOSPITAL_COMMUNITY)
Admission: RE | Admit: 2018-03-14 | Discharge: 2018-03-14 | Disposition: A | Discharge status: Home / Self Care | Payer: BLUE CROSS/BLUE SHIELD | Source: Ambulatory Visit | Attending: Orthopedic Surgery | Admitting: Orthopedic Surgery

## 2018-03-14 DIAGNOSIS — M1711 Unilateral primary osteoarthritis, right knee: Secondary | ICD-10-CM | POA: Diagnosis not present

## 2018-03-14 DIAGNOSIS — M7121 Synovial cyst of popliteal space [Baker], right knee: Secondary | ICD-10-CM | POA: Diagnosis not present

## 2018-03-14 DIAGNOSIS — X58XXXA Exposure to other specified factors, initial encounter: Secondary | ICD-10-CM | POA: Diagnosis not present

## 2018-03-14 DIAGNOSIS — M25561 Pain in right knee: Secondary | ICD-10-CM

## 2018-03-14 DIAGNOSIS — S83231A Complex tear of medial meniscus, current injury, right knee, initial encounter: Secondary | ICD-10-CM | POA: Insufficient documentation

## 2018-03-18 DIAGNOSIS — M1711 Unilateral primary osteoarthritis, right knee: Secondary | ICD-10-CM | POA: Diagnosis not present

## 2018-03-19 ENCOUNTER — Other Ambulatory Visit: Payer: Self-pay | Admitting: Orthopedic Surgery

## 2018-03-19 DIAGNOSIS — M7121 Synovial cyst of popliteal space [Baker], right knee: Secondary | ICD-10-CM

## 2018-03-22 ENCOUNTER — Ambulatory Visit: Payer: BLUE CROSS/BLUE SHIELD | Admitting: Cardiovascular Disease

## 2018-03-25 ENCOUNTER — Ambulatory Visit: Payer: BLUE CROSS/BLUE SHIELD | Admitting: Cardiovascular Disease

## 2018-03-25 ENCOUNTER — Encounter: Payer: Self-pay | Admitting: *Deleted

## 2018-03-25 VITALS — BP 130/82 | HR 89 | Ht 64.0 in | Wt 189.0 lb

## 2018-03-25 DIAGNOSIS — I2582 Chronic total occlusion of coronary artery: Secondary | ICD-10-CM | POA: Diagnosis not present

## 2018-03-25 DIAGNOSIS — I25118 Atherosclerotic heart disease of native coronary artery with other forms of angina pectoris: Secondary | ICD-10-CM | POA: Diagnosis not present

## 2018-03-25 DIAGNOSIS — I10 Essential (primary) hypertension: Secondary | ICD-10-CM

## 2018-03-25 DIAGNOSIS — E782 Mixed hyperlipidemia: Secondary | ICD-10-CM

## 2018-03-25 DIAGNOSIS — I779 Disorder of arteries and arterioles, unspecified: Secondary | ICD-10-CM | POA: Diagnosis not present

## 2018-03-25 DIAGNOSIS — I739 Peripheral vascular disease, unspecified: Secondary | ICD-10-CM

## 2018-03-25 NOTE — Progress Notes (Signed)
SUBJECTIVE: The patient presents for routine follow-up.  I reduced amlodipine to 5 mg due to bilateral ankle swelling and started losartan 25 mg.  I also gave her Lasix for a couple of days.  She underwent coronary angiography in 2018. She saw Dr. Martinique on 07/16/17. He mentioned that while from a technical standpoint he felt she would be a good candidate for CTO PCI of the RCA, he couldn't justify it at that time as her angina was well controlled on medical therapy. He also mentioned PCI would require stenting of the entire mid to distal RCA with some areas having two stent layers.  The patient denies any symptoms of chest pain, palpitations, shortness of breath, lightheadedness, dizziness, leg swelling, orthopnea, PND, and syncope.  Her biggest issue has been right knee pain.  She is seeing orthopedic surgery later this week.  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    Review of Systems: As per "subjective", otherwise negative.  Allergies  Allergen Reactions  . Ancef [Cefazolin] Itching    Itching around mouth - sx resolved quickly after administration of benadryl; with no further sx  . Ciprofloxacin Hcl     unknown  . Metaxalone     unknown  . Sulfa Antibiotics Itching, Rash and Other (See Comments)    "severe muscle cramps, tongue cracked, dry mouth "    Current Outpatient Medications  Medication Sig Dispense Refill  . amLODipine (NORVASC) 5 MG tablet Take 1 tablet (5 mg total) by mouth daily. 90 tablet 3  . aspirin 81 MG tablet Take 81 mg by mouth daily.    . DULoxetine (CYMBALTA) 60 MG capsule Take by mouth. Takes 90 mg daily  3  . Evolocumab (REPATHA SURECLICK) 485 MG/ML SOAJ Inject 140 mg into the skin every 14 (fourteen) days. 6 pen 3  . fexofenadine (ALLEGRA) 180 MG tablet Take 180 mg by mouth daily.    . furosemide (LASIX) 40 MG tablet Take 1 tablet (40 mg total) by mouth as needed for edema (leg swelling).    . hydrocortisone (ANUSOL-HC)  2.5 % rectal cream Place 1 application rectally 2 (two) times daily. 30 g 1  . isosorbide mononitrate (IMDUR) 30 MG 24 hr tablet TAKE 1 TABLET(30 MG) BY MOUTH DAILY 90 tablet 3  . losartan (COZAAR) 25 MG tablet Take 1 tablet (25 mg total) by mouth daily. 90 tablet 3  . magnesium oxide (MAG-OX) 400 MG tablet Take 400 mg by mouth daily.    . Melatonin 10 MG TABS Take 1 tablet by mouth at bedtime.     . nitroGLYCERIN (NITROSTAT) 0.4 MG SL tablet Place 1 tablet (0.4 mg total) under the tongue every 5 (five) minutes as needed for chest pain. 25 tablet 3  . pantoprazole (PROTONIX) 40 MG tablet Take 40 mg daily by mouth.     . zolpidem (AMBIEN) 10 MG tablet Take 10 mg by mouth at bedtime.   3   No current facility-administered medications for this visit.     Past Medical History:  Diagnosis Date  . Anxiety   . CAD (coronary artery disease)    PCI Stent RCA 03/12/11. Cath 03/22/11 LVEF 55-60%. RCA patent  proximal/distal stents with 30% proximal disease and 20% mid stenosis. LCX with 30% mid stenosis. Questionable spasm. Cath 07/03/17 CTO of RCA  . Carotid stenosis    Less than 50% 8/11  . Chronic total occlusion of coronary artery 07/03/2017  CTO of RCA  . GERD (gastroesophageal reflux disease) 03/02/2014  . H/O hiatal hernia   . HTN (hypertension)   . Hyperlipidemia   . Myocardial infarction ([place])   . Renal disorder    kidney stone    Past Surgical History:  Procedure Laterality Date  . CARDIAC SURGERY     stent placement  . CHOLECYSTECTOMY    . COLONOSCOPY N/A 04/01/2014   Procedure: COLONOSCOPY;  Surgeon: Rogene Houston, MD;  [facility];  Service: Endoscopy;  Laterality: N/A;  100  . Cyst removed from spine    . ECTOPIC PREGNANCY SURGERY    . ESOPHAGOGASTRODUODENOSCOPY N/A 04/01/2014   Procedure: ESOPHAGOGASTRODUODENOSCOPY (EGD);  Surgeon: Rogene Houston, MD;  [facility];  Service: Endoscopy;  Laterality: N/A;  100  . ESOPHAGOGASTRODUODENOSCOPY N/A 11/03/2016    Procedure: ESOPHAGOGASTRODUODENOSCOPY (EGD);  Surgeon: Rogene Houston, MD;  [facility];  Service: Endoscopy;  Laterality: N/A;  2:45  . Heart stent     2012  x 2 stents  . LEFT HEART CATH AND CORONARY ANGIOGRAPHY N/A 07/03/2017   Procedure: LEFT HEART CATH AND CORONARY ANGIOGRAPHY;  Surgeon: Burnell Blanks, MD;  Location: [facility];  Service: Cardiovascular;  Laterality: N/A;  . Left knee     Arthroscopic  . Left wrist    . TONSILLECTOMY AND ADENOIDECTOMY    . TOTAL KNEE ARTHROPLASTY Left 09/26/2013   Procedure: TOTAL KNEE ARTHROPLASTY- LEFT;  Surgeon: [name]., MD;  Location: [facility];  Service: Orthopedics;  Laterality: Left;  . TUBAL LIGATION      Social History   Socioeconomic History  . Marital status: Married    Spouse name: Not on file  . Number of children: Not on file  . Years of education: Not on file  . Highest education level: Not on file  Occupational History  . Not on file  Social Needs  . Financial resource strain: Not on file  . Food insecurity:    Worry: Not on file    Inability: Not on file  . Transportation needs:    Medical: Not on file    Non-medical: Not on file  Tobacco Use  . Smoking status: Former Smoker    Packs/day: 1.00    Years: 10.00    Pack years: 10.00    Types: Cigarettes    Start date: 06/27/1979    Last attempt to quit: 11/27/2010    Years since quitting: 7.3  . Smokeless tobacco: Never Used  Substance and Sexual Activity  . Alcohol use: Yes    Alcohol/week: 0.0 oz    Comment: social  . Drug use: No  . Sexual activity: Not on file  Lifestyle  . Physical activity:    Days per week: Not on file    Minutes per session: Not on file  . Stress: Not on file  Relationships  . Social connections:    Talks on phone: Not on file    Gets together: Not on file    Attends religious service: Not on file    Active member of club or organization: Not on file    Attends meetings of clubs or organizations: Not  on file    Relationship status: Not on file  . Intimate partner violence:    Fear of current or ex partner: Not on file    Emotionally abused: Not on file    Physically abused: Not on file    Forced sexual  activity: Not on file  Other Topics Concern  . Not on file  Social History Narrative  . Not on file     Vitals:   03/25/18 1528  BP: 130/82  Pulse: 89  SpO2: 98%  Weight: 189 lb (85.7 kg)  Height: 5\' 4"  (1.626 m)    Wt Readings from Last 3 Encounters:  03/25/18 189 lb (85.7 kg)  12/24/17 187 lb (84.8 kg)  11/14/17 187 lb (84.8 kg)     PHYSICAL EXAM General: NAD HEENT: Normal. Neck: No JVD, no thyromegaly. Lungs: Clear to auscultation bilaterally with normal respiratory effort. CV: Regular rate and rhythm, normal S1/S2, no S3/S4, no murmur. No pretibial or periankle edema.   Abdomen: Soft, nontender, no distention.  Neurologic: Alert and oriented.  Psych: Normal affect. Skin: Normal. Musculoskeletal: No gross deformities.    ECG: Most recent ECG reviewed.   Labs: Lab Results  Component Value Date/Time   K 3.7 07/03/2017 04:25 PM   BUN 11 07/03/2017 04:25 PM   CREATININE 0.96 07/03/2017 04:25 PM   ALT 25 07/19/2016 05:38 PM   TSH 1.482 07/02/2017 12:17 AM   TSH 1.221 03/13/2011 05:30 AM   HGB 12.9 07/03/2017 01:46 AM     Lipids: Lab Results  Component Value Date/Time   LDLCALC (H) 03/13/2011 05:30 AM    141        Total Cholesterol/HDL:CHD Risk Coronary Heart Disease Risk Table                     Men   Women  1/2 Average Risk   3.4   3.3  Average Risk       5.0   4.4  2 X Average Risk   9.6   7.1  3 X Average Risk  23.4   11.0        Use the calculated Patient Ratio above and the CHD Risk Table to determine the patient's CHD Risk.        ATP III CLASSIFICATION (LDL):  <100     mg/dL   Optimal  100-129  mg/dL   Near or Above                    Optimal  130-159  mg/dL   Borderline  160-189  mg/dL   High  >190     mg/dL   Very High    CHOL (H) 03/13/2011 05:30 AM    231        ATP III CLASSIFICATION:  <200     mg/dL   Desirable  200-239  mg/dL   Borderline High  >=240    mg/dL   High          TRIG 212 (H) 03/13/2011 05:30 AM   HDL 48 03/13/2011 05:30 AM       ASSESSMENT AND PLAN: 1. CAD with h/o MI and stents: Symptomatically stable. Continue with aggressive risk reduction and current medical therapy with ASA and Imdur. Due to weight gain, Ipreviously had tostop carvedilol as this appearedto be the culprit. If symptoms become poorly controlled with medical therapy, CTO PCI of the RCA could be considered.Will continue Repatha.  2. Essential hypertension:  Controlled on amlodipine 5 mg and losartan 25 mg.  No changes.  3. Mixed dyslipidemia:Currently on Repatha.LDL 84 on 12/08/16.  4. Carotid stenosis: Mild by Dopplers in 04/2015. Repeat in 2 years.  5. Bilateral leg edema: Euvolemic.Continue Lasix to be used as needed.  Disposition: Follow up 4 months   Kate Sable, M.D., F.A.C.C.

## 2018-03-25 NOTE — Patient Instructions (Addendum)
Your physician wants you to follow-up in:  4 MONTHS with Dr.Koneswaran You will receive a reminder letter in the mail two months in advance. If you don't receive a letter, please call our office to schedule the follow-up appointment.  Your physician recommends that you continue on your current medications as directed. Please refer to the Current Medication list given to you today.   Thanks for choosing [facility]!!!

## 2018-03-26 ENCOUNTER — Telehealth: Payer: Self-pay | Admitting: Family Medicine

## 2018-03-26 NOTE — Telephone Encounter (Signed)
Central may schedule. Gwen Her. Mannie Stabile, MD

## 2018-03-26 NOTE — Telephone Encounter (Signed)
[name] stopped by as Dr Bronson Ing referred her to you as a New Patient.  Her Dr is retiring, and she wants someone in the [facility] family. Please advise

## 2018-03-27 NOTE — Telephone Encounter (Signed)
LVM for PT and Sp to call the office to sch New Pat Appt.

## 2018-03-28 ENCOUNTER — Ambulatory Visit
Admission: RE | Admit: 2018-03-28 | Discharge: 2018-03-28 | Disposition: A | Discharge status: Home / Self Care | Payer: BLUE CROSS/BLUE SHIELD | Source: Ambulatory Visit | Attending: Orthopedic Surgery | Admitting: Orthopedic Surgery

## 2018-03-28 DIAGNOSIS — M7121 Synovial cyst of popliteal space [Baker], right knee: Secondary | ICD-10-CM | POA: Diagnosis not present

## 2018-04-17 ENCOUNTER — Other Ambulatory Visit (INDEPENDENT_AMBULATORY_CARE_PROVIDER_SITE_OTHER): Payer: Self-pay | Admitting: Internal Medicine

## 2018-05-03 ENCOUNTER — Ambulatory Visit: Payer: BLUE CROSS/BLUE SHIELD | Admitting: Family Medicine

## 2018-05-10 ENCOUNTER — Ambulatory Visit: Payer: BLUE CROSS/BLUE SHIELD | Admitting: Family Medicine

## 2018-06-12 ENCOUNTER — Encounter (INDEPENDENT_AMBULATORY_CARE_PROVIDER_SITE_OTHER): Payer: Self-pay | Admitting: Internal Medicine

## 2018-06-12 ENCOUNTER — Ambulatory Visit (INDEPENDENT_AMBULATORY_CARE_PROVIDER_SITE_OTHER): Payer: BLUE CROSS/BLUE SHIELD | Admitting: Internal Medicine

## 2018-06-12 VITALS — BP 140/100 | HR 70 | Resp 18 | Ht 62.0 in | Wt 190.1 lb

## 2018-06-12 DIAGNOSIS — K580 Irritable bowel syndrome with diarrhea: Secondary | ICD-10-CM

## 2018-06-12 DIAGNOSIS — R1031 Right lower quadrant pain: Secondary | ICD-10-CM | POA: Diagnosis not present

## 2018-06-12 LAB — COMPREHENSIVE METABOLIC PANEL
AG RATIO: 1.6 (calc) (ref 1.0–2.5)
ALT: 25 U/L (ref 6–29)
AST: 23 U/L (ref 10–35)
Albumin: 4.4 g/dL (ref 3.6–5.1)
Alkaline phosphatase (APISO): 90 U/L (ref 33–130)
BUN: 10 mg/dL (ref 7–25)
CO2: 28 mmol/L (ref 20–32)
CREATININE: 0.79 mg/dL (ref 0.50–0.99)
Calcium: 9.4 mg/dL (ref 8.6–10.4)
Chloride: 103 mmol/L (ref 98–110)
GLUCOSE: 105 mg/dL (ref 65–139)
Globulin: 2.8 g/dL (calc) (ref 1.9–3.7)
Potassium: 4.2 mmol/L (ref 3.5–5.3)
SODIUM: 138 mmol/L (ref 135–146)
TOTAL PROTEIN: 7.2 g/dL (ref 6.1–8.1)
Total Bilirubin: 0.4 mg/dL (ref 0.2–1.2)

## 2018-06-12 LAB — CBC WITH DIFFERENTIAL/PLATELET
BASOS ABS: 81 {cells}/uL (ref 0–200)
Basophils Relative: 1.3 %
EOS ABS: 273 {cells}/uL (ref 15–500)
Eosinophils Relative: 4.4 %
HEMATOCRIT: 39.7 % (ref 35.0–45.0)
HEMOGLOBIN: 13.6 g/dL (ref 11.7–15.5)
LYMPHS ABS: 1804 {cells}/uL (ref 850–3900)
MCH: 30 pg (ref 27.0–33.0)
MCHC: 34.3 g/dL (ref 32.0–36.0)
MCV: 87.4 fL (ref 80.0–100.0)
MONOS PCT: 11.9 %
MPV: 10.1 fL (ref 7.5–12.5)
NEUTROS ABS: 3305 {cells}/uL (ref 1500–7800)
Neutrophils Relative %: 53.3 %
Platelets: 326 10*3/uL (ref 140–400)
RBC: 4.54 10*6/uL (ref 3.80–5.10)
RDW: 13 % (ref 11.0–15.0)
Total Lymphocyte: 29.1 %
WBC: 6.2 10*3/uL (ref 3.8–10.8)
WBCMIX: 738 {cells}/uL (ref 200–950)

## 2018-06-12 MED ORDER — DICYCLOMINE HCL 10 MG PO CAPS: 10.0000 mg | ORAL_CAPSULE | Freq: Three times a day (TID) | ORAL | 3 refills | Status: DC

## 2018-06-12 NOTE — Progress Notes (Signed)
Subjective:    Patient ID: [name], female    DOB: 1956-05-31, 62 y.o.   MRN: [id number]  HPI Here today with c/o. Flares with her BMs. She says she is having up to 15 BMs a day. Symptoms started last week. Last Thursday and Friday and Saturday, and this Monday and Tuesday. She says the stools have been loose. She has some mucous in her stools. No BM yesterday. Had small BM this am. Symptoms for years, but has worsened.  She c/o rt upper quadrant pain and rt back pain.  She also says she has rt lower abdominal pain  No fever.  Last seen in November of 2018 GERD controlled with Protonix.  EGD in December of 2017 revealed gastritis. Normal second portion of the duodenum.     04/01/2014 EGD/Colonoscopy:Indications:Patient is 62 year old Caucasian female who has chronic GERD and intermittent double dose PPI who presents with daily epigastric pain described as burning. She has history of CAD and recent cardiac evaluation has been negative. She is undergoing diagnostic EGD followed by high-risk colonoscopy. Father had surgery for colon carcinoma at age 16. Patient was hospitalized 2 months ago at [facility] for left-sided colitis for which she is fully recovered.  Impression:  EGD findings; Mild changes of reflux esophagitis limited to GE junction. Small sliding hiatal hernia. No evidence of peptic ulcer disease or gastritis.  Colonoscopy findings; Small cecal polyp ablated via cold biopsy. No evidence of residual colitis(left-sided colitis 8 weeks). Sigmoid colon diverticulosis with single diverticulum at hepatic flexure. Small external hemorrhoids.    Review of Systems Past Medical History:  Diagnosis Date  . Anxiety   . CAD (coronary artery disease)    PCI Stent RCA 03/12/11. Cath 03/22/11 LVEF 55-60%. RCA patent  proximal/distal stents with 30% proximal disease and 20% mid stenosis. LCX with 30% mid stenosis. Questionable spasm. Cath 07/03/17 CTO of RCA  . Carotid stenosis    Less than 50% 8/11  . Chronic total occlusion of coronary artery 07/03/2017   CTO of RCA  . GERD (gastroesophageal reflux disease) 03/02/2014  . H/O hiatal hernia   . HTN (hypertension)   . Hyperlipidemia   . Myocardial infarction ([place])   . Renal disorder    kidney stone    Past Surgical History:  Procedure Laterality Date  . CARDIAC SURGERY     stent placement  . CHOLECYSTECTOMY    . COLONOSCOPY N/A 04/01/2014   Procedure: COLONOSCOPY;  Surgeon: Rogene Houston, MD;  [facility];  Service: Endoscopy;  Laterality: N/A;  100  . Cyst removed from spine    . ECTOPIC PREGNANCY SURGERY    . ESOPHAGOGASTRODUODENOSCOPY N/A 04/01/2014   Procedure: ESOPHAGOGASTRODUODENOSCOPY (EGD);  Surgeon: Rogene Houston, MD;  [facility];  Service: Endoscopy;  Laterality: N/A;  100  . ESOPHAGOGASTRODUODENOSCOPY N/A 11/03/2016   Procedure: ESOPHAGOGASTRODUODENOSCOPY (EGD);  Surgeon: Rogene Houston, MD;  [facility];  Service: Endoscopy;  Laterality: N/A;  2:45  . Heart stent     2012  x 2 stents  . LEFT HEART CATH AND CORONARY ANGIOGRAPHY N/A 07/03/2017   Procedure: LEFT HEART CATH AND CORONARY ANGIOGRAPHY;  Surgeon: Burnell Blanks, MD;  Location: [facility];  Service: Cardiovascular;  Laterality: N/A;  . Left knee     Arthroscopic  . Left wrist    . TONSILLECTOMY AND ADENOIDECTOMY    . TOTAL KNEE ARTHROPLASTY Left 09/26/2013   Procedure: TOTAL KNEE ARTHROPLASTY- LEFT;  Surgeon:  [name]., MD;  Location: [facility];  Service: Orthopedics;  Laterality: Left;  . TUBAL LIGATION      Allergies  Allergen Reactions  . Ancef [Cefazolin] Itching    Itching around mouth - sx resolved quickly after administration of benadryl; with no further sx  . Ciprofloxacin Hcl     unknown  . Metaxalone     unknown  . Sulfa Antibiotics Itching, Rash and Other (See Comments)    "severe muscle cramps, tongue cracked, dry mouth "    Current Outpatient Medications on  File Prior to Visit  Medication Sig Dispense Refill  . acetaminophen (TYLENOL) 650 MG CR tablet Take 650 mg by mouth every 8 (eight) hours as needed for pain. Takes 2 tabs twice a day.    Marland Kitchen amLODipine (NORVASC) 5 MG tablet Take 1 tablet (5 mg total) by mouth daily. 90 tablet 3  . aspirin 81 MG tablet Take 81 mg by mouth daily.    . DULoxetine (CYMBALTA) 60 MG capsule Take by mouth. Takes 90 mg daily  3  . Evolocumab (REPATHA SURECLICK) 270 MG/ML SOAJ Inject 140 mg into the skin every 14 (fourteen) days. 6 pen 3  . fexofenadine (ALLEGRA) 180 MG tablet Take 180 mg by mouth daily.    . furosemide (LASIX) 40 MG tablet Take 1 tablet (40 mg total) by mouth as needed for edema (leg swelling).    . hydrocortisone (ANUSOL-HC) 2.5 % rectal cream Place 1 application rectally 2 (two) times daily. 30 g 1  . isosorbide mononitrate (IMDUR) 30 MG 24 hr tablet TAKE 1 TABLET(30 MG) BY MOUTH DAILY 90 tablet 3  . losartan (COZAAR) 25 MG tablet Take 1 tablet (25 mg total) by mouth daily. 90 tablet 3  . nitroGLYCERIN (NITROSTAT) 0.4 MG SL tablet Place 1 tablet (0.4 mg total) under the tongue every 5 (five) minutes as needed for chest pain. 25 tablet 3  . pantoprazole (PROTONIX) 40 MG tablet TAKE 1 TABLET BY MOUTH TWICE DAILY BEFORE MEALS 60 tablet 3  . zolpidem (AMBIEN) 10 MG tablet Take 10 mg by mouth at bedtime.   3   No current facility-administered medications on file prior to visit.         Objective:   Physical Exam Blood pressure (!) 140/100, pulse 70, resp. rate 18, height 5\' 2"  (1.575 m), weight 190 lb 1.6 oz (86.2 kg). Alert and oriented. Skin warm and dry. Oral mucosa is moist.   . Sclera anicteric, conjunctivae is pink. Thyroid not enlarged. No cervical lymphadenopathy. Lungs clear. Heart regular rate and rhythm.  Abdomen is soft. Bowel sounds are positive. No hepatomegaly. No abdominal masses felt. No tenderness.  No edema to lower extremities.          Assessment & Plan:  RLQ pain. Diarrhea.  Am going to get a CT abdomen/pelvis with CM. Diarrhea/ IBS: Am going start her on Dicyclomine 10mg  TID. CBC and CMET.

## 2018-06-12 NOTE — Addendum Note (Signed)
Addended by: Butch Penny on: 06/12/2018 02:09 PM   Modules accepted: Orders

## 2018-06-12 NOTE — Patient Instructions (Signed)
CBC and CT abdomen/pelvis with CM.

## 2018-06-14 ENCOUNTER — Ambulatory Visit (HOSPITAL_COMMUNITY)
Admission: RE | Admit: 2018-06-14 | Discharge: 2018-06-14 | Disposition: A | Discharge status: Home / Self Care | Payer: BLUE CROSS/BLUE SHIELD | Source: Ambulatory Visit | Attending: Internal Medicine | Admitting: Internal Medicine

## 2018-06-14 DIAGNOSIS — R1031 Right lower quadrant pain: Secondary | ICD-10-CM | POA: Diagnosis not present

## 2018-06-14 DIAGNOSIS — K760 Fatty (change of) liver, not elsewhere classified: Secondary | ICD-10-CM | POA: Diagnosis not present

## 2018-06-14 DIAGNOSIS — Z9049 Acquired absence of other specified parts of digestive tract: Secondary | ICD-10-CM | POA: Diagnosis not present

## 2018-06-14 DIAGNOSIS — N281 Cyst of kidney, acquired: Secondary | ICD-10-CM | POA: Diagnosis not present

## 2018-06-19 ENCOUNTER — Ambulatory Visit (INDEPENDENT_AMBULATORY_CARE_PROVIDER_SITE_OTHER): Payer: BLUE CROSS/BLUE SHIELD | Admitting: Internal Medicine

## 2018-06-21 ENCOUNTER — Other Ambulatory Visit (INDEPENDENT_AMBULATORY_CARE_PROVIDER_SITE_OTHER): Payer: Self-pay | Admitting: *Deleted

## 2018-06-21 DIAGNOSIS — R1031 Right lower quadrant pain: Principal | ICD-10-CM

## 2018-06-21 DIAGNOSIS — G8929 Other chronic pain: Secondary | ICD-10-CM

## 2018-06-24 ENCOUNTER — Other Ambulatory Visit (INDEPENDENT_AMBULATORY_CARE_PROVIDER_SITE_OTHER): Payer: Self-pay | Admitting: *Deleted

## 2018-06-24 ENCOUNTER — Encounter (INDEPENDENT_AMBULATORY_CARE_PROVIDER_SITE_OTHER): Payer: Self-pay | Admitting: *Deleted

## 2018-06-24 DIAGNOSIS — R1031 Right lower quadrant pain: Principal | ICD-10-CM

## 2018-06-24 DIAGNOSIS — G8929 Other chronic pain: Secondary | ICD-10-CM

## 2018-07-01 DIAGNOSIS — L570 Actinic keratosis: Secondary | ICD-10-CM | POA: Diagnosis not present

## 2018-07-01 DIAGNOSIS — Z85828 Personal history of other malignant neoplasm of skin: Secondary | ICD-10-CM | POA: Diagnosis not present

## 2018-07-24 ENCOUNTER — Ambulatory Visit: Payer: BLUE CROSS/BLUE SHIELD | Admitting: Cardiovascular Disease

## 2018-07-24 ENCOUNTER — Encounter: Payer: Self-pay | Admitting: Cardiovascular Disease

## 2018-07-24 VITALS — BP 140/90 | HR 88 | Ht 62.0 in | Wt 192.0 lb

## 2018-07-24 DIAGNOSIS — I779 Disorder of arteries and arterioles, unspecified: Secondary | ICD-10-CM

## 2018-07-24 DIAGNOSIS — I739 Peripheral vascular disease, unspecified: Secondary | ICD-10-CM

## 2018-07-24 DIAGNOSIS — I2582 Chronic total occlusion of coronary artery: Secondary | ICD-10-CM

## 2018-07-24 DIAGNOSIS — I10 Essential (primary) hypertension: Secondary | ICD-10-CM

## 2018-07-24 DIAGNOSIS — I25118 Atherosclerotic heart disease of native coronary artery with other forms of angina pectoris: Secondary | ICD-10-CM

## 2018-07-24 DIAGNOSIS — Z955 Presence of coronary angioplasty implant and graft: Secondary | ICD-10-CM

## 2018-07-24 DIAGNOSIS — E782 Mixed hyperlipidemia: Secondary | ICD-10-CM

## 2018-07-24 NOTE — Progress Notes (Signed)
SUBJECTIVE: The patient presents for routine follow-up.  She has a history of coronary artery disease and hypertension.  ECG performed in the office today which I ordered and personally interpreted demonstrates normal sinus rhythm with no ischemic ST segment or T-wave abnormalities, nor any arrhythmias.  The patient denies any symptoms of chest pain, palpitations, shortness of breath, lightheadedness, dizziness, leg swelling, orthopnea, PND, and syncope.  Her biggest issue continues to be right knee pain.  She is wearing a brace.     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 Her daughter, Nelia Shi, is also my patient. Both the patient and her daughter have struggled with similar marital issues.  Review of Systems: As per "subjective", otherwise negative.  Allergies  Allergen Reactions  . Ancef [Cefazolin] Itching    Itching around mouth - sx resolved quickly after administration of benadryl; with no further sx  . Ciprofloxacin Hcl     unknown  . Metaxalone     unknown  . Sulfa Antibiotics Itching, Rash and Other (See Comments)    "severe muscle cramps, tongue cracked, dry mouth "    Current Outpatient Medications  Medication Sig Dispense Refill  . acetaminophen (TYLENOL) 650 MG CR tablet Take 650 mg by mouth every 8 (eight) hours as needed for pain. Takes 2 tabs twice a day.    Marland Kitchen amLODipine (NORVASC) 5 MG tablet Take 1 tablet (5 mg total) by mouth daily. 90 tablet 3  . aspirin 81 MG tablet Take 81 mg by mouth daily.    . DULoxetine (CYMBALTA) 60 MG capsule Take by mouth. Takes 90 mg daily  3  . Evolocumab (REPATHA SURECLICK) 496 MG/ML SOAJ Inject 140 mg into the skin every 14 (fourteen) days. 6 pen 3  . fexofenadine (ALLEGRA) 180 MG tablet Take 180 mg by mouth daily.    . furosemide (LASIX) 40 MG tablet Take 1 tablet (40 mg total) by mouth as needed for edema (leg swelling).    . hydrocortisone (ANUSOL-HC) 2.5 % rectal cream Place 1  application rectally 2 (two) times daily. 30 g 1  . isosorbide mononitrate (IMDUR) 30 MG 24 hr tablet TAKE 1 TABLET(30 MG) BY MOUTH DAILY 90 tablet 3  . losartan (COZAAR) 25 MG tablet Take 1 tablet (25 mg total) by mouth daily. 90 tablet 3  . nitroGLYCERIN (NITROSTAT) 0.4 MG SL tablet Place 1 tablet (0.4 mg total) under the tongue every 5 (five) minutes as needed for chest pain. 25 tablet 3  . pantoprazole (PROTONIX) 40 MG tablet TAKE 1 TABLET BY MOUTH TWICE DAILY BEFORE MEALS 60 tablet 3  . zolpidem (AMBIEN) 10 MG tablet Take 10 mg by mouth at bedtime.   3   No current facility-administered medications for this visit.     Past Medical History:  Diagnosis Date  . Anxiety   . CAD (coronary artery disease)    PCI Stent RCA 03/12/11. Cath 03/22/11 LVEF 55-60%. RCA patent  proximal/distal stents with 30% proximal disease and 20% mid stenosis. LCX with 30% mid stenosis. Questionable spasm. Cath 07/03/17 CTO of RCA  . Carotid stenosis    Less than 50% 8/11  . Chronic total occlusion of coronary artery 07/03/2017   CTO of RCA  . GERD (gastroesophageal reflux disease) 03/02/2014  . H/O hiatal hernia   . HTN (hypertension)   . Hyperlipidemia   . Myocardial infarction ([place])   . Renal disorder  kidney stone    Past Surgical History:  Procedure Laterality Date  . CARDIAC SURGERY     stent placement  . CHOLECYSTECTOMY    . COLONOSCOPY N/A 04/01/2014   Procedure: COLONOSCOPY;  Surgeon: Rogene Houston, MD;  [facility];  Service: Endoscopy;  Laterality: N/A;  100  . Cyst removed from spine    . ECTOPIC PREGNANCY SURGERY    . ESOPHAGOGASTRODUODENOSCOPY N/A 04/01/2014   Procedure: ESOPHAGOGASTRODUODENOSCOPY (EGD);  Surgeon: Rogene Houston, MD;  [facility];  Service: Endoscopy;  Laterality: N/A;  100  . ESOPHAGOGASTRODUODENOSCOPY N/A 11/03/2016   Procedure: ESOPHAGOGASTRODUODENOSCOPY (EGD);  Surgeon: Rogene Houston, MD;  [facility];  Service: Endoscopy;   Laterality: N/A;  2:45  . Heart stent     2012  x 2 stents  . LEFT HEART CATH AND CORONARY ANGIOGRAPHY N/A 07/03/2017   Procedure: LEFT HEART CATH AND CORONARY ANGIOGRAPHY;  Surgeon: Burnell Blanks, MD;  Location: [facility];  Service: Cardiovascular;  Laterality: N/A;  . Left knee     Arthroscopic  . Left wrist    . TONSILLECTOMY AND ADENOIDECTOMY    . TOTAL KNEE ARTHROPLASTY Left 09/26/2013   Procedure: TOTAL KNEE ARTHROPLASTY- LEFT;  Surgeon: [name]., MD;  Location: [facility];  Service: Orthopedics;  Laterality: Left;  . TUBAL LIGATION      Social History   Socioeconomic History  . Marital status: Married    Spouse name: Not on file  . Number of children: Not on file  . Years of education: Not on file  . Highest education level: Not on file  Occupational History  . Not on file  Social Needs  . Financial resource strain: Not on file  . Food insecurity:    Worry: Not on file    Inability: Not on file  . Transportation needs:    Medical: Not on file    Non-medical: Not on file  Tobacco Use  . Smoking status: Former Smoker    Packs/day: 1.00    Years: 10.00    Pack years: 10.00    Types: Cigarettes    Start date: 06/27/1979    Last attempt to quit: 11/27/2010    Years since quitting: 7.6  . Smokeless tobacco: Never Used  Substance and Sexual Activity  . Alcohol use: Yes    Alcohol/week: 0.0 standard drinks    Comment: social  . Drug use: No  . Sexual activity: Not on file  Lifestyle  . Physical activity:    Days per week: Not on file    Minutes per session: Not on file  . Stress: Not on file  Relationships  . Social connections:    Talks on phone: Not on file    Gets together: Not on file    Attends religious service: Not on file    Active member of club or organization: Not on file    Attends meetings of clubs or organizations: Not on file    Relationship status: Not on file  . Intimate partner violence:    Fear of current or ex partner:  Not on file    Emotionally abused: Not on file    Physically abused: Not on file    Forced sexual activity: Not on file  Other Topics Concern  . Not on file  Social History Narrative  . Not on file     Vitals:   07/24/18 1046  BP: 140/90  Pulse: 88  SpO2: 95%  Weight: 192 lb (87.1 kg)  Height: 5\' 2"  (1.575 m)    Wt Readings from Last 3 Encounters:  07/24/18 192 lb (87.1 kg)  06/12/18 190 lb 1.6 oz (86.2 kg)  03/25/18 189 lb (85.7 kg)     PHYSICAL EXAM General: NAD HEENT: Normal. Neck: No JVD, no thyromegaly. Lungs: Clear to auscultation bilaterally with normal respiratory effort. CV: Regular rate and rhythm, normal S1/S2, no S3/S4, no murmur. No pretibial or periankle edema.  No carotid bruit.   Abdomen: Soft, nontender, no distention.  Neurologic: Alert and oriented.  Psych: Normal affect. Skin: Normal. Musculoskeletal: No gross deformities.    ECG: Reviewed above under Subjective   Labs: Lab Results  Component Value Date/Time   K 4.2 06/12/2018 10:34 AM   BUN 10 06/12/2018 10:34 AM   CREATININE 0.79 06/12/2018 10:34 AM   ALT 25 06/12/2018 10:34 AM   TSH 1.482 07/02/2017 12:17 AM   TSH 1.221 03/13/2011 05:30 AM   HGB 13.6 06/12/2018 10:34 AM     Lipids: Lab Results  Component Value Date/Time   LDLCALC (H) 03/13/2011 05:30 AM    141        Total Cholesterol/HDL:CHD Risk Coronary Heart Disease Risk Table                     Men   Women  1/2 Average Risk   3.4   3.3  Average Risk       5.0   4.4  2 X Average Risk   9.6   7.1  3 X Average Risk  23.4   11.0        Use the calculated Patient Ratio above and the CHD Risk Table to determine the patient's CHD Risk.        ATP III CLASSIFICATION (LDL):  <100     mg/dL   Optimal  100-129  mg/dL   Near or Above                    Optimal  130-159  mg/dL   Borderline  160-189  mg/dL   High  >190     mg/dL   Very High   CHOL (H) 03/13/2011 05:30 AM    231        ATP III CLASSIFICATION:  <200      mg/dL   Desirable  200-239  mg/dL   Borderline High  >=240    mg/dL   High          TRIG 212 (H) 03/13/2011 05:30 AM   HDL 48 03/13/2011 05:30 AM       ASSESSMENT AND PLAN:  1. CAD with h/o MI and stents: Symptomatically stable. Continue with aggressive risk reduction and current medical therapy with ASA and Imdur. Due to weight gain, Ipreviously had tostop carvedilol as this appearedto be the culprit. If symptoms become poorly controlled with medical therapy, CTO PCI of the RCA could be considered.Will continue Repatha.  2. Essential hypertension:  Mildly elevated today on amlodipine 5 mg and losartan 25 mg.  This will need continued monitoring.  This is likely being aggravated by right knee pain.  3. Mixed dyslipidemia:Currently on Repatha. I will obtain a copy of lipids from PCP.  4. Carotid stenosis: Mild by Dopplers in 04/2015. Repeat in 2 years.  5. Bilateral leg edema: Euvolemic.Continue Lasix to be used as needed.   Disposition: Follow up 6 months   Kate Sable, M.D., F.A.C.C.

## 2018-07-24 NOTE — Patient Instructions (Signed)

## 2018-08-12 DIAGNOSIS — E7801 Familial hypercholesterolemia: Secondary | ICD-10-CM | POA: Diagnosis not present

## 2018-08-12 DIAGNOSIS — M797 Fibromyalgia: Secondary | ICD-10-CM | POA: Diagnosis not present

## 2018-08-12 DIAGNOSIS — I10 Essential (primary) hypertension: Secondary | ICD-10-CM | POA: Diagnosis not present

## 2018-08-12 DIAGNOSIS — K219 Gastro-esophageal reflux disease without esophagitis: Secondary | ICD-10-CM | POA: Diagnosis not present

## 2018-08-20 ENCOUNTER — Telehealth: Payer: Self-pay | Admitting: Cardiovascular Disease

## 2018-08-20 NOTE — Telephone Encounter (Signed)
Pt needs to contact their pharmacy to see if their supply has been affected. Recent recalls were for losartan being manufactured by [employer] and [employer] will have record of whether or not they are using that manufacturer.

## 2018-08-20 NOTE — Telephone Encounter (Signed)
Patient informed and verbalized understanding. Patient said that she called [facility] already and was told that her losartan wasn't affected.

## 2018-08-20 NOTE — Telephone Encounter (Signed)
losartan (COZAAR) 25 MG tablet   Patient saw on the news that medication has been recalled and news said to call Doctor asap to get new medicaton

## 2018-08-21 DIAGNOSIS — M25561 Pain in right knee: Secondary | ICD-10-CM | POA: Diagnosis not present

## 2018-08-21 DIAGNOSIS — I10 Essential (primary) hypertension: Secondary | ICD-10-CM | POA: Diagnosis not present

## 2018-08-21 DIAGNOSIS — F411 Generalized anxiety disorder: Secondary | ICD-10-CM | POA: Diagnosis not present

## 2018-08-21 DIAGNOSIS — G252 Other specified forms of tremor: Secondary | ICD-10-CM | POA: Diagnosis not present

## 2018-08-21 DIAGNOSIS — Z1331 Encounter for screening for depression: Secondary | ICD-10-CM | POA: Diagnosis not present

## 2018-08-21 DIAGNOSIS — M797 Fibromyalgia: Secondary | ICD-10-CM | POA: Diagnosis not present

## 2018-08-30 ENCOUNTER — Encounter: Payer: Self-pay | Admitting: Physical Medicine & Rehabilitation

## 2018-09-10 ENCOUNTER — Encounter: Payer: BLUE CROSS/BLUE SHIELD | Admitting: Physical Medicine & Rehabilitation

## 2018-10-02 ENCOUNTER — Ambulatory Visit (INDEPENDENT_AMBULATORY_CARE_PROVIDER_SITE_OTHER): Payer: BLUE CROSS/BLUE SHIELD | Admitting: Internal Medicine

## 2018-10-02 ENCOUNTER — Encounter (INDEPENDENT_AMBULATORY_CARE_PROVIDER_SITE_OTHER): Payer: Self-pay | Admitting: Internal Medicine

## 2018-10-02 VITALS — BP 180/90 | HR 72 | Temp 98.1°F | Ht 62.0 in | Wt 195.2 lb

## 2018-10-02 DIAGNOSIS — K219 Gastro-esophageal reflux disease without esophagitis: Secondary | ICD-10-CM

## 2018-10-02 DIAGNOSIS — K588 Other irritable bowel syndrome: Secondary | ICD-10-CM | POA: Diagnosis not present

## 2018-10-02 NOTE — Progress Notes (Signed)
Subjective:    Patient ID: [name], female    DOB: 03-07-1956, 62 y.o.   MRN: [id number]  HPI  Here today for f/u. Last seen In July of this year. Wt in July 190.  Hx of IBS. Underwent an US Abdomen in July of this year for Rt upper quadrant pain and Rt lower quadrant pain which revealed no acute abnormality,.  She is doing good. She is concerned that she is gaining weight.  Someday's she still has trouble with her BMs. Not diarrhea, but she has frequent stools.  Sometimes she may have 10 formed stools a day.  No melena or BRRB. She does have hemorrhoids.   GERD controlled with Protonix.  EGD in December of 2017 revealed gastritis. Normal second portion of the duodenum.     04/01/2014 EGD/Colonoscopy:Indications:Patient is 62 year old Caucasian female who has chronic GERD and intermittent double dose PPI who presents with daily epigastric pain described as burning. She has history of CAD and recent cardiac evaluation has been negative. She is undergoing diagnostic EGD followed by high-risk colonoscopy. Father had surgery for colon carcinoma at age 81. Patient was hospitalized 2 months ago at [facility] for left-sided colitis for which she is fully recovered.  Impression:  EGD findings; Mild changes of reflux esophagitis limited to GE junction. Small sliding hiatal hernia. No evidence of peptic ulcer disease or gastritis.    Review of Systems Past Medical History:  Diagnosis Date  . Anxiety   . CAD (coronary artery disease)    PCI Stent RCA 03/12/11. Cath 03/22/11 LVEF 55-60%. RCA patent  proximal/distal stents with 30% proximal disease and 20% mid stenosis. LCX with 30% mid stenosis. Questionable spasm. Cath 07/03/17 CTO of RCA  . Carotid stenosis    Less than 50% 8/11  . Chronic total occlusion of coronary artery 07/03/2017   CTO of RCA  . GERD (gastroesophageal reflux disease) 03/02/2014  . H/O hiatal hernia   . HTN (hypertension)   . Hyperlipidemia   . Myocardial infarction  ([place])   . Renal disorder    kidney stone    Past Surgical History:  Procedure Laterality Date  . CARDIAC SURGERY     stent placement  . CHOLECYSTECTOMY    . COLONOSCOPY N/A 04/01/2014   Procedure: COLONOSCOPY;  Surgeon: Rogene Houston, MD;  [facility];  Service: Endoscopy;  Laterality: N/A;  100  . Cyst removed from spine    . ECTOPIC PREGNANCY SURGERY    . ESOPHAGOGASTRODUODENOSCOPY N/A 04/01/2014   Procedure: ESOPHAGOGASTRODUODENOSCOPY (EGD);  Surgeon: Rogene Houston, MD;  [facility];  Service: Endoscopy;  Laterality: N/A;  100  . ESOPHAGOGASTRODUODENOSCOPY N/A 11/03/2016   Procedure: ESOPHAGOGASTRODUODENOSCOPY (EGD);  Surgeon: Rogene Houston, MD;  [facility];  Service: Endoscopy;  Laterality: N/A;  2:45  . Heart stent     2012  x 2 stents  . LEFT HEART CATH AND CORONARY ANGIOGRAPHY N/A 07/03/2017   Procedure: LEFT HEART CATH AND CORONARY ANGIOGRAPHY;  Surgeon: Burnell Blanks, MD;  Location: [facility];  Service: Cardiovascular;  Laterality: N/A;  . Left knee     Arthroscopic  . Left wrist    . TONSILLECTOMY AND ADENOIDECTOMY    . TOTAL KNEE ARTHROPLASTY Left 09/26/2013   Procedure: TOTAL KNEE ARTHROPLASTY- LEFT;  Surgeon: [name]., MD;  Location: [facility];  Service: Orthopedics;  Laterality: Left;  . TUBAL LIGATION      Allergies  Allergen Reactions  .  Ancef [Cefazolin] Itching    Itching around mouth - sx resolved quickly after administration of benadryl; with no further sx  . Ciprofloxacin Hcl     unknown  . Metaxalone     unknown  . Sulfa Antibiotics Itching, Rash and Other (See Comments)    "severe muscle cramps, tongue cracked, dry mouth "    Current Outpatient Medications on File Prior to Visit  Medication Sig Dispense Refill  . acetaminophen (TYLENOL) 650 MG CR tablet Take 650 mg by mouth every 8 (eight) hours as needed for pain. Takes 2 tabs twice a day.    Marland Kitchen amLODipine (NORVASC) 5 MG tablet Take 5 mg by  mouth daily.    Marland Kitchen aspirin 81 MG tablet Take 81 mg by mouth daily.    . DULoxetine (CYMBALTA) 60 MG capsule Take by mouth. Takes 90 mg daily  3  . Evolocumab (REPATHA SURECLICK) 737 MG/ML SOAJ Inject 140 mg into the skin every 14 (fourteen) days. 6 pen 3  . fexofenadine (ALLEGRA) 180 MG tablet Take 180 mg by mouth daily.    . fluticasone (FLONASE) 50 MCG/ACT nasal spray Place into both nostrils daily.    . furosemide (LASIX) 40 MG tablet Take 1 tablet (40 mg total) by mouth as needed for edema (leg swelling).    . hydrocortisone (ANUSOL-HC) 2.5 % rectal cream Place 1 application rectally 2 (two) times daily. (Patient taking differently: Place 1 application rectally as needed. ) 30 g 1  . isosorbide mononitrate (IMDUR) 30 MG 24 hr tablet TAKE 1 TABLET(30 MG) BY MOUTH DAILY 90 tablet 3  . losartan (COZAAR) 25 MG tablet Take 25 mg by mouth daily.    . nitroGLYCERIN (NITROSTAT) 0.4 MG SL tablet Place 1 tablet (0.4 mg total) under the tongue every 5 (five) minutes as needed for chest pain. 25 tablet 3  . pantoprazole (PROTONIX) 40 MG tablet TAKE 1 TABLET BY MOUTH TWICE DAILY BEFORE MEALS (Patient taking differently: daily. ) 60 tablet 3  . zolpidem (AMBIEN) 10 MG tablet Take 10 mg by mouth at bedtime.   3  . amLODipine (NORVASC) 5 MG tablet Take 1 tablet (5 mg total) by mouth daily. 90 tablet 3  . losartan (COZAAR) 25 MG tablet Take 1 tablet (25 mg total) by mouth daily. 90 tablet 3   No current facility-administered medications on file prior to visit.         Objective:   Physical Exam Blood pressure (!) 180/90, pulse 72, temperature 98.1 F (36.7 C), height 5\' 2"  (1.575 m), weight 195 lb 3.2 oz (88.5 kg). Alert and oriented. Skin warm and dry. Oral mucosa is moist.   . Sclera anicteric, conjunctivae is pink. Thyroid not enlarged. No cervical lymphadenopathy. Lungs clear. Heart regular rate and rhythm.  Abdomen is soft. Bowel sounds are positive. No hepatomegaly. No abdominal masses felt. No  tenderness.  No edema to lower extremities          Assessment & Plan:  IBS.   GERD: Continue the Protonix.  OV in 1 year.

## 2018-10-02 NOTE — Patient Instructions (Signed)
OV in 1  Year. 

## 2018-10-03 ENCOUNTER — Ambulatory Visit (INDEPENDENT_AMBULATORY_CARE_PROVIDER_SITE_OTHER): Payer: BLUE CROSS/BLUE SHIELD | Admitting: Internal Medicine

## 2018-10-29 ENCOUNTER — Other Ambulatory Visit (HOSPITAL_COMMUNITY): Payer: Self-pay | Admitting: Family Medicine

## 2018-10-29 DIAGNOSIS — Z1231 Encounter for screening mammogram for malignant neoplasm of breast: Secondary | ICD-10-CM

## 2018-11-11 ENCOUNTER — Ambulatory Visit (HOSPITAL_COMMUNITY)
Admission: RE | Admit: 2018-11-11 | Discharge: 2018-11-11 | Disposition: A | Discharge status: Home / Self Care | Payer: BLUE CROSS/BLUE SHIELD | Source: Ambulatory Visit | Attending: Family Medicine | Admitting: Family Medicine

## 2018-11-11 ENCOUNTER — Encounter (HOSPITAL_COMMUNITY): Payer: Self-pay

## 2018-11-11 DIAGNOSIS — Z1231 Encounter for screening mammogram for malignant neoplasm of breast: Secondary | ICD-10-CM | POA: Diagnosis not present

## 2018-11-14 ENCOUNTER — Other Ambulatory Visit (INDEPENDENT_AMBULATORY_CARE_PROVIDER_SITE_OTHER): Payer: Self-pay | Admitting: Internal Medicine

## 2018-12-17 ENCOUNTER — Other Ambulatory Visit: Payer: Self-pay | Admitting: Cardiovascular Disease

## 2018-12-29 ENCOUNTER — Other Ambulatory Visit: Payer: Self-pay | Admitting: Cardiovascular Disease

## 2019-01-13 ENCOUNTER — Other Ambulatory Visit: Payer: Self-pay | Admitting: Cardiovascular Disease

## 2019-02-13 ENCOUNTER — Other Ambulatory Visit: Payer: Self-pay | Admitting: Cardiovascular Disease

## 2019-02-20 ENCOUNTER — Other Ambulatory Visit: Payer: Self-pay | Admitting: Cardiovascular Disease

## 2019-02-27 DIAGNOSIS — M797 Fibromyalgia: Secondary | ICD-10-CM | POA: Diagnosis not present

## 2019-02-27 DIAGNOSIS — F411 Generalized anxiety disorder: Secondary | ICD-10-CM | POA: Diagnosis not present

## 2019-02-27 DIAGNOSIS — I10 Essential (primary) hypertension: Secondary | ICD-10-CM | POA: Diagnosis not present

## 2019-02-27 DIAGNOSIS — M545 Low back pain: Secondary | ICD-10-CM | POA: Diagnosis not present

## 2019-02-28 ENCOUNTER — Encounter: Payer: Self-pay | Admitting: Family Medicine

## 2019-03-19 ENCOUNTER — Telehealth: Payer: Self-pay | Admitting: *Deleted

## 2019-03-19 NOTE — Telephone Encounter (Signed)
Per Cover My Meds - Repatha [employer] - [place] approved 5/39/1225 through 02/19/2020.

## 2019-03-24 ENCOUNTER — Encounter (INDEPENDENT_AMBULATORY_CARE_PROVIDER_SITE_OTHER): Payer: Self-pay | Admitting: *Deleted

## 2019-04-29 ENCOUNTER — Other Ambulatory Visit (INDEPENDENT_AMBULATORY_CARE_PROVIDER_SITE_OTHER): Payer: Self-pay | Admitting: Internal Medicine

## 2019-05-22 ENCOUNTER — Other Ambulatory Visit: Payer: Self-pay | Admitting: Cardiovascular Disease

## 2019-06-06 ENCOUNTER — Other Ambulatory Visit: Payer: Self-pay | Admitting: Cardiovascular Disease

## 2019-06-16 ENCOUNTER — Ambulatory Visit (INDEPENDENT_AMBULATORY_CARE_PROVIDER_SITE_OTHER): Payer: BC Managed Care – PPO | Admitting: Adult Health

## 2019-06-16 ENCOUNTER — Encounter: Payer: Self-pay | Admitting: Adult Health

## 2019-06-16 ENCOUNTER — Other Ambulatory Visit: Payer: Self-pay

## 2019-06-16 ENCOUNTER — Other Ambulatory Visit (HOSPITAL_COMMUNITY)
Admission: RE | Admit: 2019-06-16 | Discharge: 2019-06-16 | Disposition: A | Discharge status: Home / Self Care | Payer: BC Managed Care – PPO | Source: Ambulatory Visit | Attending: Adult Health | Admitting: Adult Health

## 2019-06-16 VITALS — BP 134/82 | HR 91 | Ht 62.0 in | Wt 198.4 lb

## 2019-06-16 DIAGNOSIS — Z1211 Encounter for screening for malignant neoplasm of colon: Secondary | ICD-10-CM | POA: Insufficient documentation

## 2019-06-16 DIAGNOSIS — Z01419 Encounter for gynecological examination (general) (routine) without abnormal findings: Secondary | ICD-10-CM | POA: Diagnosis not present

## 2019-06-16 DIAGNOSIS — Z1212 Encounter for screening for malignant neoplasm of rectum: Secondary | ICD-10-CM

## 2019-06-16 DIAGNOSIS — N950 Postmenopausal bleeding: Secondary | ICD-10-CM

## 2019-06-16 LAB — HEMOCCULT GUIAC POC 1CARD (OFFICE): Fecal Occult Blood, POC: NEGATIVE

## 2019-06-16 NOTE — Patient Instructions (Signed)
  Postmenopausal Bleeding  Postmenopausal bleeding is any bleeding that a woman has after she has entered into menopause. Menopause is the end of a woman's fertile years. After menopause, a woman no longer ovulates and does not have menstrual periods. Postmenopausal bleeding may have various causes, including:  Menopausal hormone therapy (MHT).  Endometrial atrophy. After menopause, low estrogen hormone levels cause the membrane that lines the uterus (endometrium) to become thinner. You may have bleeding as the endometrium thins.  Endometrial hyperplasia. This condition is caused by excess estrogen hormones and low levels of progesterone hormones. The excess estrogen causes the endometrium to thicken, which can lead to bleeding. In some cases, this can lead to cancer of the uterus.  Endometrial cancer.  Non-cancerous growths (polyps) on the endometrium, the lining of the uterus, or the cervix.  Uterine fibroids. These are non-cancerous growths in or around the uterus muscle tissue that can cause heavy bleeding. Any type of postmenopausal bleeding, even if it appears to be a typical menstrual period, should be evaluated by your health care provider. Treatment will depend on the cause of the bleeding. Follow these instructions at home:  Pay attention to any changes in your symptoms.  Avoid using tampons and douches as told by your health care provider.  Change your pads regularly.  Get regular pelvic exams and Pap tests.  Take iron supplements as told by your health care provider.  Take over-the-counter and prescription medicines only as told by your health care provider.  Keep all follow-up visits as told by your health care provider. This is important. Contact a health care provider if:  Your bleeding lasts more than 1 week.  You have abdominal pain.  You have bleeding with or after sexual intercourse.  You have bleeding that happens more often than every 3 weeks. Get help  right away if:  You have a fever, chills, headache, dizziness, muscle aches, and bleeding.  You have severe pain with bleeding.  You are passing blood clots.  You have heavy bleeding, need more than 1 pad an hour, and have never experienced this before.  You feel faint. Summary  Postmenopausal bleeding is any bleeding that a woman has after she has entered into menopause.  Postmenopausal bleeding may have various causes. Treatment will depend on the cause of the bleeding.  Any type of postmenopausal bleeding, even if it appears to be a typical menstrual period, should be evaluated by your health care provider.  Be sure to pay attention to any changes in your symptoms and keep all follow-up visits as told by your health care provider. This information is not intended to replace advice given to you by your health care provider. Make sure you discuss any questions you have with your health care provider. Document Released: 02/21/2006 Document Revised: 02/20/2018 Document Reviewed: 02/06/2017 Elsevier Patient Education  2020 Elsevier Inc.  

## 2019-06-16 NOTE — Progress Notes (Signed)
Patient ID: [name], female   DOB: 1956-10-18, 63 y.o.   MRN: [id number] History of Present Illness: [name] is a 63 year old white female, married, PM in for a well woman gyn exam and pap. She had vaginal spotting May 21 and 22, none since, had had in past and saw Dr Evie Lacks and had biopsy that was negative but that was several years. PCP is [facility].   Current Medications, Allergies, Past Medical History, Past Surgical History, Family History and Social History were reviewed in [employer] record.     Review of Systems: Patient denies any headaches, hearing loss, fatigue, blurred vision, shortness of breath, chest pain, abdominal pain, problems with bowel movements, urination, or intercourse.(not active) No joint pain or mood swings. Had vaginal bleeding in May.   Physical Exam:BP 134/82 (BP Location: Left Arm, Patient Position: Sitting, Cuff Size: Normal)   Pulse 91   Ht 5\' 2"  (1.575 m)   Wt 198 lb 6.4 oz (90 kg)   BMI 36.29 kg/m  General:  Well developed, well nourished, no acute distress Skin:  Warm and dry Neck:  Midline trachea, normal thyroid, good ROM, no lymphadenopathy,no carotid bruits heard Lungs; Clear to auscultation bilaterally Breast:  No dominant palpable mass, retraction, or nipple discharge Cardiovascular: Regular rate and rhythm Abdomen:  Soft, non tender, no hepatosplenomegaly Pelvic:  External genitalia is normal in appearance, no lesions.  The vagina is normal in appearance. Urethra has no lesions or masses. The cervix is smooth,pap with HPV and 16/18 genotyping performed.  Uterus is felt to be normal size, shape, and contour.  No adnexal masses or tenderness noted.Bladder is non tender, no masses felt. Rectal: Good sphincter tone, no polyps, or hemorrhoids felt.  Hemoccult negative. Extremities/musculoskeletal:  No swelling or varicosities noted, no clubbing or cyanosis Psych:  No mood changes, alert and cooperative,seems happy PHQ 2  score 0 Fall risk is low. Examination chaperoned by Estill Bamberg Rash LPN   Impression: 1. Encounter for gynecological examination with Papanicolaou smear of cervix   2. Screening for colorectal cancer   3. PMB (postmenopausal bleeding)       Plan: Pap with HPV 16/18 genotyping  Physical in 1 year Pap in 3 if normal Return in 2 weeks for GYN [place] to assess endometrium  Review handout on PMB Mammogram yearly Colonoscopy per Dr Trenton Gammon with cardiologist

## 2019-06-17 LAB — CYTOLOGY - PAP
Adequacy: ABSENT
Diagnosis: NEGATIVE
HPV: NOT DETECTED

## 2019-06-19 DIAGNOSIS — I10 Essential (primary) hypertension: Secondary | ICD-10-CM | POA: Diagnosis not present

## 2019-06-19 DIAGNOSIS — F411 Generalized anxiety disorder: Secondary | ICD-10-CM | POA: Diagnosis not present

## 2019-06-19 DIAGNOSIS — M545 Low back pain: Secondary | ICD-10-CM | POA: Diagnosis not present

## 2019-06-19 DIAGNOSIS — M797 Fibromyalgia: Secondary | ICD-10-CM | POA: Diagnosis not present

## 2019-06-29 ENCOUNTER — Other Ambulatory Visit: Payer: Self-pay | Admitting: Cardiovascular Disease

## 2019-07-02 ENCOUNTER — Other Ambulatory Visit: Payer: Self-pay | Admitting: Cardiovascular Disease

## 2019-07-03 ENCOUNTER — Other Ambulatory Visit: Payer: Self-pay

## 2019-07-03 ENCOUNTER — Ambulatory Visit (INDEPENDENT_AMBULATORY_CARE_PROVIDER_SITE_OTHER): Payer: BC Managed Care – PPO

## 2019-07-03 ENCOUNTER — Telehealth: Payer: Self-pay | Admitting: Adult Health

## 2019-07-03 DIAGNOSIS — N950 Postmenopausal bleeding: Secondary | ICD-10-CM

## 2019-07-03 NOTE — Progress Notes (Signed)
PELVIC US TA/TV:atrophic homogeneous anteverted uterus,wnl,EEC 2.7 mm,normal ovaries bilat,no free fluid,no pain during ultrasound,limited view of endometrium and ovaries,no obvious abnormalities

## 2019-07-03 NOTE — Telephone Encounter (Signed)
Pt aware that [place] looked good, uterus was normal EEC was 2.80mm and ovaries looked normal on limited view

## 2019-07-10 ENCOUNTER — Other Ambulatory Visit (INDEPENDENT_AMBULATORY_CARE_PROVIDER_SITE_OTHER): Payer: Self-pay | Admitting: *Deleted

## 2019-07-10 DIAGNOSIS — K580 Irritable bowel syndrome with diarrhea: Secondary | ICD-10-CM

## 2019-07-10 MED ORDER — DICYCLOMINE HCL 10 MG PO CAPS: 10.0000 mg | ORAL_CAPSULE | Freq: Three times a day (TID) | ORAL | 0 refills | Status: DC

## 2019-07-13 ENCOUNTER — Other Ambulatory Visit: Payer: Self-pay | Admitting: Cardiovascular Disease

## 2019-07-14 DIAGNOSIS — D1801 Hemangioma of skin and subcutaneous tissue: Secondary | ICD-10-CM | POA: Diagnosis not present

## 2019-07-14 DIAGNOSIS — Z85828 Personal history of other malignant neoplasm of skin: Secondary | ICD-10-CM | POA: Diagnosis not present

## 2019-07-14 DIAGNOSIS — L570 Actinic keratosis: Secondary | ICD-10-CM | POA: Diagnosis not present

## 2019-07-22 DIAGNOSIS — I10 Essential (primary) hypertension: Secondary | ICD-10-CM | POA: Diagnosis not present

## 2019-07-22 DIAGNOSIS — M545 Low back pain: Secondary | ICD-10-CM | POA: Diagnosis not present

## 2019-07-22 DIAGNOSIS — M797 Fibromyalgia: Secondary | ICD-10-CM | POA: Diagnosis not present

## 2019-07-22 DIAGNOSIS — F411 Generalized anxiety disorder: Secondary | ICD-10-CM | POA: Diagnosis not present

## 2019-07-31 IMAGING — MR MR KNEE*R* W/O CM
4 of 6 series · 15 of 40 positions shown · non-contrast
Comparison: Plain films right knee 11/17/2015.

CLINICAL DATA: Right knee pain for 1 month.  No known injury.

EXAM:
MRI OF THE RIGHT KNEE WITHOUT CONTRAST
TECHNIQUE: Multiplanar, multisequence MR imaging of the knee was performed. No
intravenous contrast was administered.

[Series 3: pdfs axial · axial · 3.0mm · 0.20mm/px · z∈[-63,+9]mm · 3 of 30 slices shown]
[im 5/30]
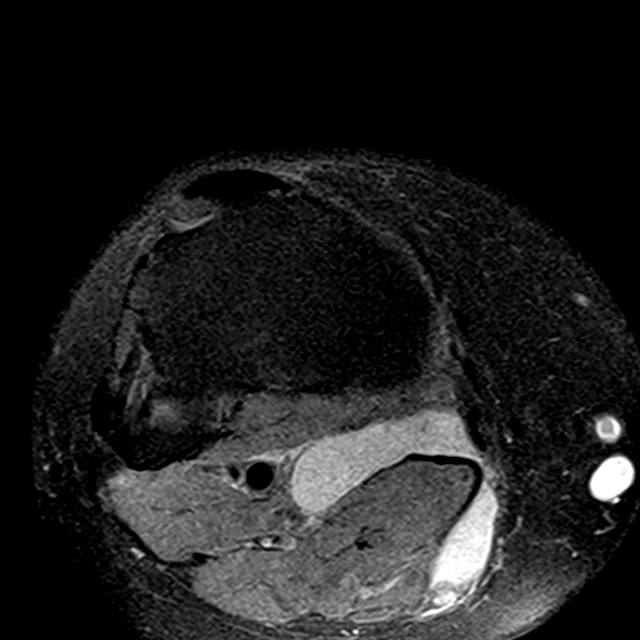
[im 15/30]
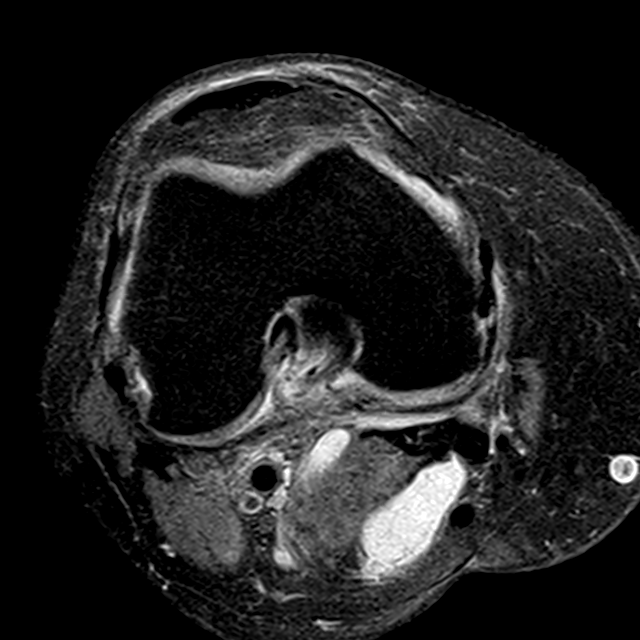
[im 25/30]
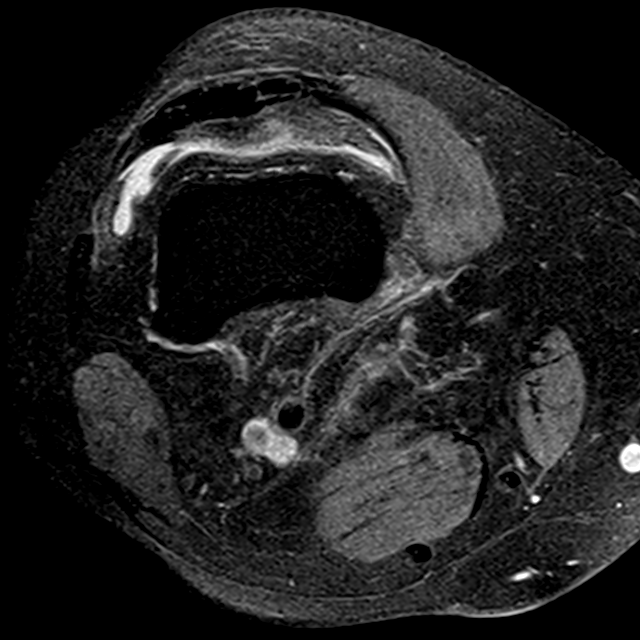

[Series 4: T1 · coronal · 3.0mm · 0.18mm/px · 6 of 24 slices shown]
[im 1/24]
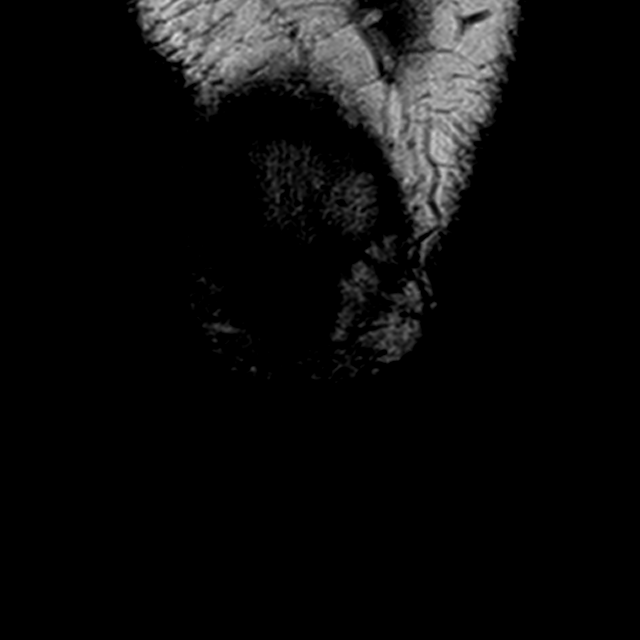
[im 4/24]
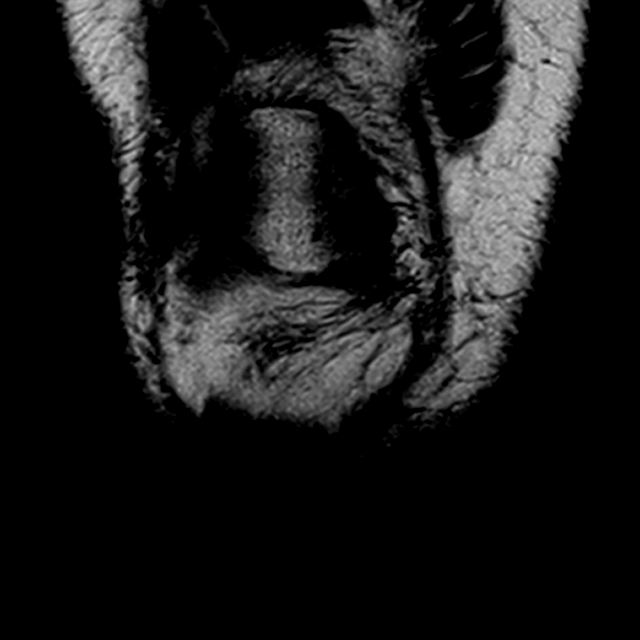
[im 8/24]
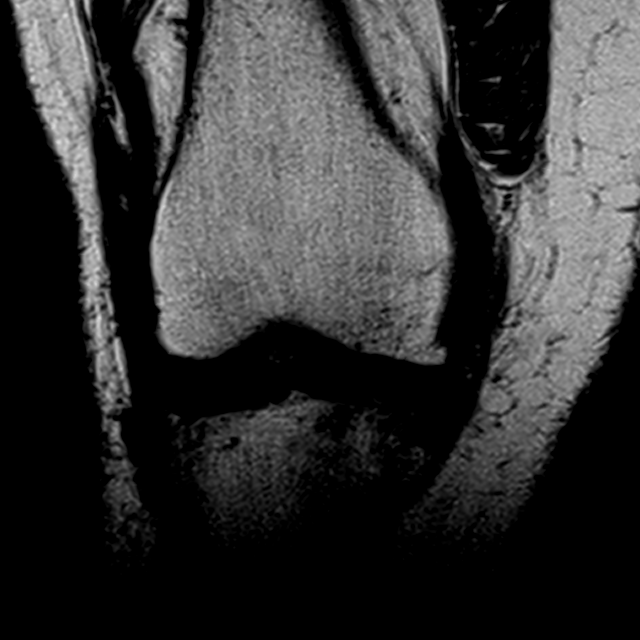
[im 12/24]
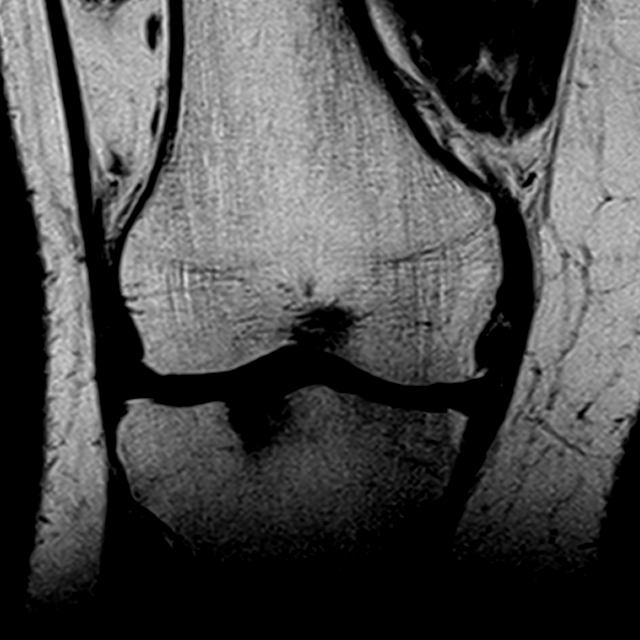
[im 16/24]
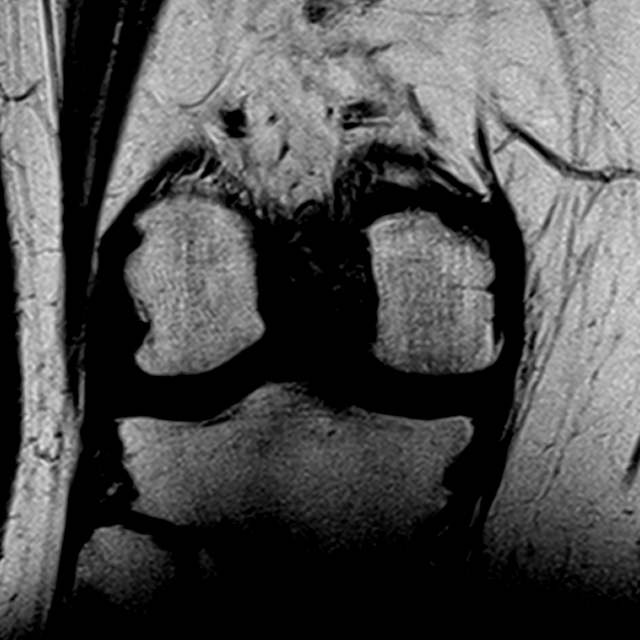
[im 20/24]
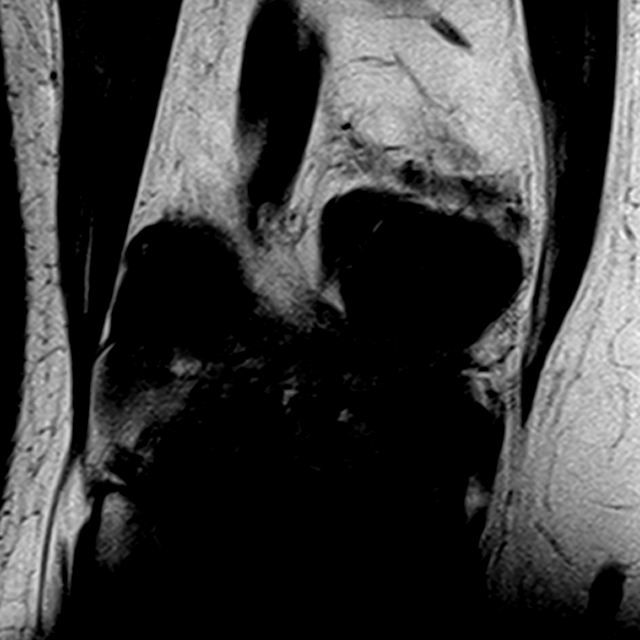

[Series 5: pdfs sag · sagittal · 3.0mm · 0.20mm/px · 3 of 29 slices shown]
[im 5/29]
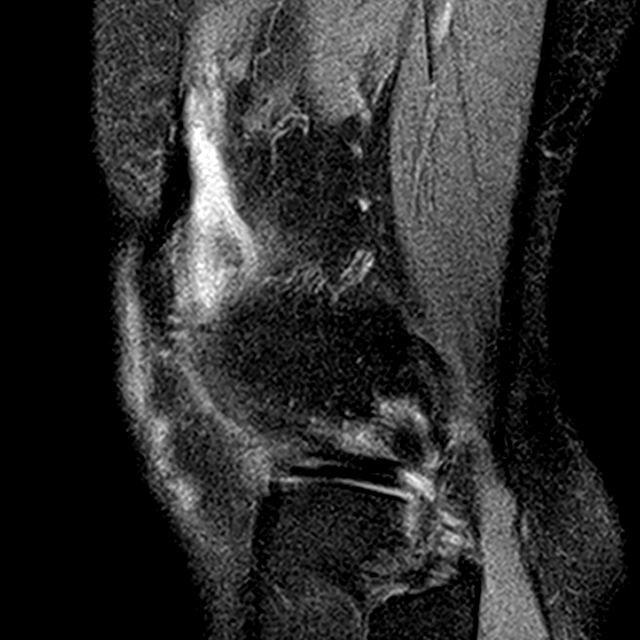
[im 17/29]
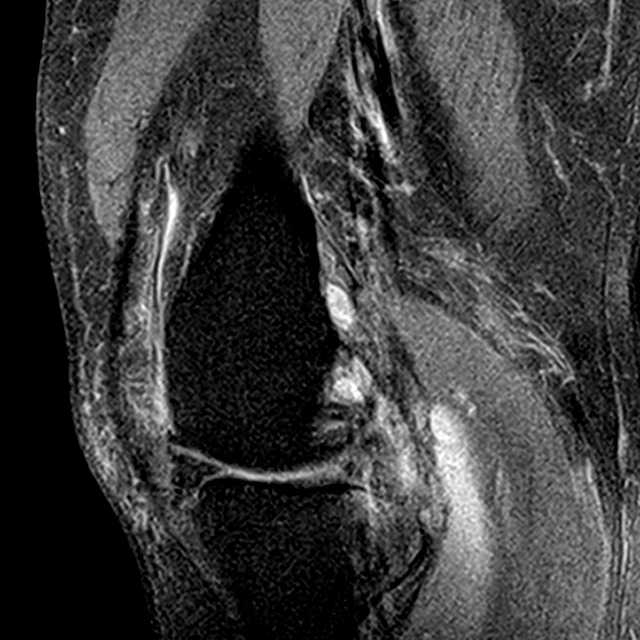
[im 25/29]
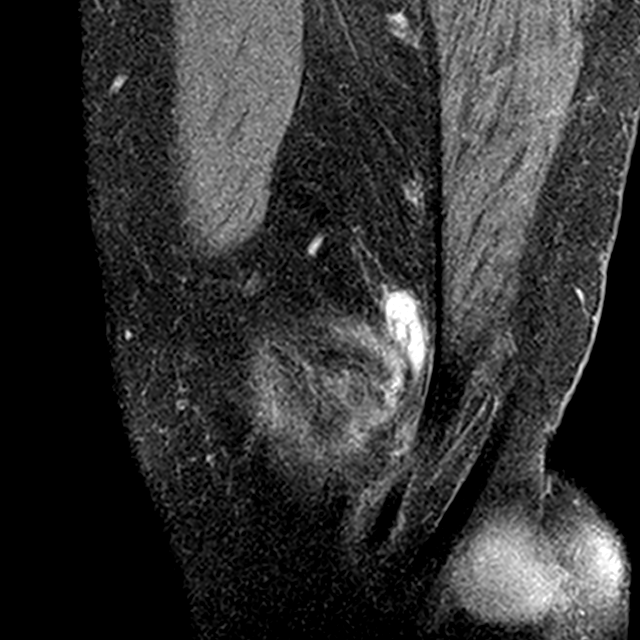

[Series 8: pdfs cor blade · coronal · 3.0mm · 0.50mm/px · 3 of 24 slices shown]
[im 4/24]
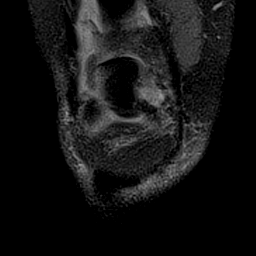
[im 12/24]
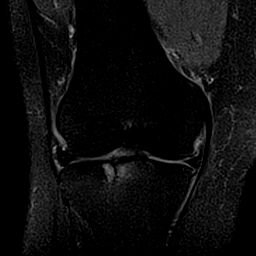
[im 20/24]
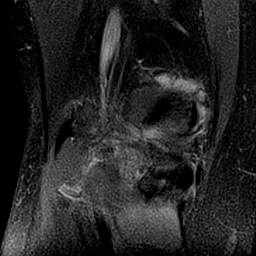

[15 of 40 positions shown; findings below may reference images not displayed]

FINDINGS: MENISCI

Medial meniscus: Complex tear in the posterior horn includes a
radial component through the root of the meniscus.

Lateral meniscus: The anterior horn is severely degenerated and
diminutive. Horizontal tear in the anterior body reaches the femoral
articular surface.

LIGAMENTS

Cruciates:  Intact.  There is mucoid degeneration of the ACL.

Collaterals:  Intact.

CARTILAGE

Patellofemoral: Marked thinning is seen along the medial patellar
facet

Medial:  Mildly degenerated.

Lateral:  Mildly degenerated.

Joint:  Small effusion.

Popliteal Fossa: The patient has a large Baker's cyst. The cyst
cannot be discretely measured as it extends along both the anterior
and posterior sides of the medial gastrocnemius. Component of the
cyst anterior to the medial gastrocnemius measures approximately
cm transverse x 1.2 cm AP in the component along the posterior
aspect of the gastrocnemius measures 1.3 cm x 2.7 cm AP. The cyst is
at least 8 cm craniocaudal.

Extensor Mechanism:  Intact.

Bones: No fracture or worrisome lesion. There is some marrow edema
in the tibial eminences and a subchondral cyst in the lateral
eminence likely related to mucoid degeneration of the ACL. There is
some osteophytosis about the knee which is most prominent medially.

Other: None.
IMPRESSION: Complex tear posterior horn medial meniscus includes a radial
component through the meniscal root.

Horizontal tear anterior body of the lateral meniscus reaches the
femoral articular surface. The anterior horn of the lateral meniscus
is markedly degenerated.

Very large Baker's cyst.

Mild to moderate osteoarthritis about the knee.

## 2019-08-10 ENCOUNTER — Other Ambulatory Visit: Payer: Self-pay | Admitting: Cardiovascular Disease

## 2019-08-18 ENCOUNTER — Other Ambulatory Visit: Payer: Self-pay | Admitting: Cardiovascular Disease

## 2019-09-03 DIAGNOSIS — M79641 Pain in right hand: Secondary | ICD-10-CM | POA: Diagnosis not present

## 2019-09-03 DIAGNOSIS — I10 Essential (primary) hypertension: Secondary | ICD-10-CM | POA: Diagnosis not present

## 2019-09-03 DIAGNOSIS — M797 Fibromyalgia: Secondary | ICD-10-CM | POA: Diagnosis not present

## 2019-09-03 DIAGNOSIS — Z23 Encounter for immunization: Secondary | ICD-10-CM | POA: Diagnosis not present

## 2019-09-03 DIAGNOSIS — M79642 Pain in left hand: Secondary | ICD-10-CM | POA: Diagnosis not present

## 2019-09-03 DIAGNOSIS — R197 Diarrhea, unspecified: Secondary | ICD-10-CM | POA: Diagnosis not present

## 2019-09-03 DIAGNOSIS — M545 Low back pain: Secondary | ICD-10-CM | POA: Diagnosis not present

## 2019-09-03 DIAGNOSIS — F411 Generalized anxiety disorder: Secondary | ICD-10-CM | POA: Diagnosis not present

## 2019-09-04 ENCOUNTER — Other Ambulatory Visit: Payer: Self-pay | Admitting: *Deleted

## 2019-09-04 MED ORDER — ISOSORBIDE MONONITRATE ER 30 MG PO TB24: 30.0000 mg | ORAL_TABLET | Freq: Every day | ORAL | 0 refills | Status: DC

## 2019-09-06 ENCOUNTER — Other Ambulatory Visit: Payer: Self-pay | Admitting: Cardiovascular Disease

## 2019-09-09 ENCOUNTER — Encounter (INDEPENDENT_AMBULATORY_CARE_PROVIDER_SITE_OTHER): Payer: Self-pay | Admitting: *Deleted

## 2019-09-12 ENCOUNTER — Other Ambulatory Visit: Payer: Self-pay | Admitting: Cardiovascular Disease

## 2019-09-15 ENCOUNTER — Ambulatory Visit (INDEPENDENT_AMBULATORY_CARE_PROVIDER_SITE_OTHER): Payer: BC Managed Care – PPO | Admitting: Nurse Practitioner

## 2019-09-15 ENCOUNTER — Encounter (INDEPENDENT_AMBULATORY_CARE_PROVIDER_SITE_OTHER): Payer: Self-pay | Admitting: *Deleted

## 2019-09-22 ENCOUNTER — Other Ambulatory Visit: Payer: Self-pay | Admitting: *Deleted

## 2019-09-22 MED ORDER — REPATHA SURECLICK 140 MG/ML ~~LOC~~ SOAJ: 140.0000 mg | SUBCUTANEOUS | 3 refills | Status: DC

## 2019-10-03 ENCOUNTER — Other Ambulatory Visit: Payer: Self-pay | Admitting: Cardiovascular Disease

## 2019-10-06 ENCOUNTER — Ambulatory Visit (INDEPENDENT_AMBULATORY_CARE_PROVIDER_SITE_OTHER): Payer: BC Managed Care – PPO | Admitting: Nurse Practitioner

## 2019-10-13 ENCOUNTER — Other Ambulatory Visit: Payer: Self-pay | Admitting: Cardiovascular Disease

## 2019-10-21 ENCOUNTER — Other Ambulatory Visit: Payer: Self-pay

## 2019-10-21 ENCOUNTER — Ambulatory Visit: Payer: BC Managed Care – PPO | Admitting: Cardiovascular Disease

## 2019-10-21 ENCOUNTER — Encounter: Payer: Self-pay | Admitting: Cardiovascular Disease

## 2019-10-21 VITALS — BP 143/88 | HR 84 | Ht 62.0 in | Wt 199.2 lb

## 2019-10-21 DIAGNOSIS — I10 Essential (primary) hypertension: Secondary | ICD-10-CM | POA: Diagnosis not present

## 2019-10-21 DIAGNOSIS — I2582 Chronic total occlusion of coronary artery: Secondary | ICD-10-CM

## 2019-10-21 DIAGNOSIS — I25118 Atherosclerotic heart disease of native coronary artery with other forms of angina pectoris: Secondary | ICD-10-CM | POA: Diagnosis not present

## 2019-10-21 DIAGNOSIS — I779 Disorder of arteries and arterioles, unspecified: Secondary | ICD-10-CM

## 2019-10-21 DIAGNOSIS — E782 Mixed hyperlipidemia: Secondary | ICD-10-CM

## 2019-10-21 DIAGNOSIS — Z955 Presence of coronary angioplasty implant and graft: Secondary | ICD-10-CM

## 2019-10-21 NOTE — Patient Instructions (Addendum)
Your physician recommends that you schedule a follow-up appointment in: [place] physician recommends that you continue on your current medications as directed. Please refer to the Current Medication list given to you today.  Your physician has requested that you have a carotid duplex. This test is an ultrasound of the carotid arteries in your neck. It looks at blood flow through these arteries that supply the brain with blood. Allow one hour for this exam. There are no restrictions or special instructions.  Thank you for choosing [facility]!!

## 2019-10-21 NOTE — Progress Notes (Signed)
SUBJECTIVE:  The patient presents for routine follow-up.  She has a history of coronary artery disease and hypertension.  The patient denies any symptoms of chest pain, palpitations, shortness of breath, lightheadedness, dizziness, leg swelling, orthopnea, PND, and syncope.  Primary complaints relate to bilateral knee pain and lower back pain.  She watches her 62-year-old grandson who weighs 32 pounds.  ECG performed in the office today which I ordered and personally interpreted demonstrates normal sinus rhythm with no ischemic ST segment or T-wave abnormalities, nor any arrhythmias.   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 Her daughter, Nelia Shi, is also my patient. Both the patient and her daughter have struggled with similar marital issues.  Review of Systems: As per "subjective", otherwise negative.  Allergies  Allergen Reactions  . Ancef [Cefazolin] Itching    Itching around mouth - sx resolved quickly after administration of benadryl; with no further sx  . Ciprofloxacin Hcl     unknown  . 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 Metaxalone     unknown  . Sulfa Antibiotics Itching, Rash and Other (See Comments)    "severe muscle cramps, tongue cracked, dry mouth "    Current Outpatient Medications  Medication Sig Dispense Refill  . acetaminophen (TYLENOL) 650 MG CR tablet Take 650 mg by mouth every 8 (eight) hours as needed for pain. Takes 2 tabs twice a day.    Marland Kitchen amLODipine (NORVASC) 5 MG tablet Take 5 mg by mouth daily.    Marland Kitchen aspirin 81 MG tablet Take 81 mg by mouth daily.    Marland Kitchen buPROPion (WELLBUTRIN XL) 150 MG 24 hr tablet Take 150 mg by mouth daily.    . busPIRone (BUSPAR) 5 MG tablet Take 5 mg by mouth 2 (two) times daily as needed.    . DULoxetine (CYMBALTA) 60 MG capsule Take by mouth. Takes 90 mg daily  3  . Evolocumab (REPATHA SURECLICK) [id number] MG/ML SOAJ Inject 140 mg as directed every 14 (fourteen) days. 6 mL 3  . fluticasone  (FLONASE) 50 MCG/ACT nasal spray Place into both nostrils daily.    . furosemide (LASIX) 40 MG tablet Take 1 tablet (40 mg total) by mouth as needed for edema (leg swelling).    . hydrocortisone (ANUSOL-HC) 2.5 % rectal cream Place 1 application rectally 2 (two) times daily. (Patient taking differently: Place 1 application rectally as needed. ) 30 g 1  . isosorbide mononitrate (IMDUR) 30 MG 24 hr tablet TAKE 1 TABLET(30 MG) BY MOUTH DAILY 90 tablet 0  . losartan (COZAAR) 25 MG tablet TAKE 1 TABLET BY MOUTH EVERY DAY 30 tablet 0  . nitroGLYCERIN (NITROSTAT) 0.4 MG SL tablet Place 1 tablet (0.4 mg total) under the tongue every 5 (five) minutes as needed for chest pain. 25 tablet 3  . pantoprazole (PROTONIX) 40 MG tablet TAKE 1 TABLET BY MOUTH TWICE DAILY BEFORE MEALS 60 tablet 3  . zolpidem (AMBIEN) 10 MG tablet Take 10 mg by mouth at bedtime.   3   No current facility-administered medications for this visit.     Past Medical History:  Diagnosis Date  . Anxiety   . CAD (coronary artery disease)    PCI Stent RCA 03/12/11. Cath 03/22/11 LVEF 55-60%. RCA patent  proximal/distal stents with 30% proximal disease and 20% mid stenosis. LCX with 30% mid stenosis. Questionable spasm. Cath 07/03/17 CTO of RCA  . Carotid stenosis    Less than  50% 8/11  . Chronic total occlusion of coronary artery 07/03/2017   CTO of RCA  . GERD (gastroesophageal reflux disease) 03/02/2014  . H/O hiatal hernia   . HTN (hypertension)   . Hyperlipidemia   . Myocardial infarction ([place])   . Renal disorder    kidney stone    Past Surgical History:  Procedure Laterality Date  . CARDIAC SURGERY     stent placement  . CHOLECYSTECTOMY    . COLONOSCOPY N/A 04/01/2014   Procedure: COLONOSCOPY;  Surgeon: Rogene Houston, MD;  [facility];  Service: Endoscopy;  Laterality: N/A;  100  . Cyst removed from spine    . ECTOPIC PREGNANCY SURGERY    . ESOPHAGOGASTRODUODENOSCOPY N/A 04/01/2014   Procedure:  ESOPHAGOGASTRODUODENOSCOPY (EGD);  Surgeon: Rogene Houston, MD;  [facility];  Service: Endoscopy;  Laterality: N/A;  100  . ESOPHAGOGASTRODUODENOSCOPY N/A 11/03/2016   Procedure: ESOPHAGOGASTRODUODENOSCOPY (EGD);  Surgeon: Rogene Houston, MD;  Location: [facility];  Service: Endoscopy;  Laterality: N/A;  2:45  . Heart stent     2012  x 2 stents  . LEFT HEART CATH AND CORONARY ANGIOGRAPHY N/A 07/03/2017   Procedure: LEFT HEART CATH AND CORONARY ANGIOGRAPHY;  Surgeon: Burnell Blanks, MD;  Location: [facility];  Service: Cardiovascular;  Laterality: N/A;  . Left knee     Arthroscopic  . Left wrist    . TONSILLECTOMY AND ADENOIDECTOMY    . TOTAL KNEE ARTHROPLASTY Left 09/26/2013   Procedure: TOTAL KNEE ARTHROPLASTY- LEFT;  Surgeon: [name]., MD;  Location: [facility];  Service: Orthopedics;  Laterality: Left;  . TUBAL LIGATION      Social History   Socioeconomic History  . Marital status: Married    Spouse name: Not on file  . Number of children: Not on file  . Years of education: Not on file  . Highest education level: Not on file  Occupational History  . Not on file  Social Needs  . Financial resource strain: Not on file  . Food insecurity    Worry: Not on file    Inability: Not on file  . Transportation needs    Medical: Not on file    Non-medical: Not on file  Tobacco Use  . Smoking status: Former Smoker    Packs/day: 1.00    Years: 10.00    Pack years: 10.00    Types: Cigarettes    Start date: 06/27/1979    Quit date: 11/27/2010    Years since quitting: 8.9  . Smokeless tobacco: Never Used  Substance and Sexual Activity  . Alcohol use: Yes    Alcohol/week: 0.0 standard drinks    Comment: social  . Drug use: No  . Sexual activity: Not Currently    Birth control/protection: Post-menopausal, Surgical    Comment: tubal  Lifestyle  . Physical activity    Days per week: Not on file    Minutes per session: Not on file  . Stress: Not on  file  Relationships  . Social Herbalist on phone: Not on file    Gets together: Not on file    Attends religious service: Not on file    Active member of club or organization: Not on file    Attends meetings of clubs or organizations: Not on file    Relationship status: Not on file  . Intimate partner violence    Fear of current or ex partner: Not  on file    Emotionally abused: Not on file    Physically abused: Not on file    Forced sexual activity: Not on file  Other Topics Concern  . Not on file  Social History Narrative  . Not on file     Vitals:   10/21/19 0845  BP: (!) 143/88  Pulse: 84  SpO2: 96%  Weight: 199 lb 3.2 oz (90.4 kg)  Height: 5\' 2"  (1.575 m)    Wt Readings from Last 3 Encounters:  10/21/19 199 lb 3.2 oz (90.4 kg)  06/16/19 198 lb 6.4 oz (90 kg)  10/02/18 195 lb 3.2 oz (88.5 kg)     PHYSICAL EXAM General: NAD HEENT: Normal. Neck: No JVD, no thyromegaly. Lungs: Clear to auscultation bilaterally with normal respiratory effort. CV: Regular rate and rhythm, normal S1/S2, no S3/S4, no murmur. No pretibial or periankle edema.  No carotid bruit.   Abdomen: Soft, nontender, no distention.  Neurologic: Alert and oriented.  Psych: Normal affect. Skin: Normal. Musculoskeletal: No gross deformities.      Labs: Lab Results  Component Value Date/Time   K 4.2 06/12/2018 10:34 AM   BUN 10 06/12/2018 10:34 AM   CREATININE 0.79 06/12/2018 10:34 AM   ALT 25 06/12/2018 10:34 AM   TSH 1.482 07/02/2017 12:17 AM   TSH 1.221 03/13/2011 05:30 AM   HGB 13.6 06/12/2018 10:34 AM     Lipids: Lab Results  Component Value Date/Time   LDLCALC (H) 03/13/2011 05:30 AM    141        Total Cholesterol/HDL:CHD Risk Coronary Heart Disease Risk Table                     Men   Women  1/2 Average Risk   3.4   3.3  Average Risk       5.0   4.4  2 X Average Risk   9.6   7.1  3 X Average Risk  23.4   11.0        Use the calculated Patient Ratio above and  the CHD Risk Table to determine the patient's CHD Risk.        ATP III CLASSIFICATION (LDL):  <100     mg/dL   Optimal  100-129  mg/dL   Near or Above                    Optimal  130-159  mg/dL   Borderline  160-189  mg/dL   High  >190     mg/dL   Very High   CHOL (H) 03/13/2011 05:30 AM    231        ATP III CLASSIFICATION:  <200     mg/dL   Desirable  200-239  mg/dL   Borderline High  >=240    mg/dL   High          TRIG 212 (H) 03/13/2011 05:30 AM   HDL 48 03/13/2011 05:30 AM       ASSESSMENT AND PLAN: 1. CAD with h/o MI and stents: Symptomatically stable. Continue with aggressive risk reduction and current medical therapy with ASA and Imdur. Due to weight gain, Ipreviously had tostop carvedilol as this appearedto be the culprit. If symptoms become poorly controlled with medical therapy, CTO PCI of the RCA could be considered.Will continue Repatha.  2. Essential hypertension: Mildly elevated today on amlodipine 5 mg and losartan 25 mg.  This will need continued monitoring.   3.  Mixed dyslipidemia:Currently on Repatha. LDL 62 on 02/28/2019.  4. Carotid stenosis: Mild by Dopplers in 04/2015.  I will repeat Dopplers.  5. Bilateral leg edema: Euvolemic.Continue Lasix to be used as needed.     Disposition: Follow up 1 yr   Kate Sable, M.D., F.A.C.C.

## 2019-10-22 ENCOUNTER — Other Ambulatory Visit: Payer: Self-pay

## 2019-10-22 ENCOUNTER — Ambulatory Visit: Payer: BC Managed Care – PPO | Admitting: Allergy & Immunology

## 2019-10-22 ENCOUNTER — Encounter: Payer: Self-pay | Admitting: Allergy & Immunology

## 2019-10-22 VITALS — BP 154/86 | HR 98 | Temp 98.9°F | Resp 18 | Ht 62.6 in | Wt 199.2 lb

## 2019-10-22 DIAGNOSIS — J302 Other seasonal allergic rhinitis: Secondary | ICD-10-CM

## 2019-10-22 DIAGNOSIS — J3089 Other allergic rhinitis: Secondary | ICD-10-CM | POA: Diagnosis not present

## 2019-10-22 DIAGNOSIS — L2089 Other atopic dermatitis: Secondary | ICD-10-CM

## 2019-10-22 DIAGNOSIS — T7800XD Anaphylactic reaction due to unspecified food, subsequent encounter: Secondary | ICD-10-CM

## 2019-10-22 MED ORDER — LEVOCETIRIZINE DIHYDROCHLORIDE 5 MG PO TABS: 5.0000 mg | ORAL_TABLET | Freq: Every evening | ORAL | 5 refills | Status: DC

## 2019-10-22 MED ORDER — MONTELUKAST SODIUM 10 MG PO TABS: 10.0000 mg | ORAL_TABLET | Freq: Every day | ORAL | 5 refills | Status: DC

## 2019-10-22 MED ORDER — AZELASTINE HCL 0.1 % NA SOLN: 2.0000 | Freq: Two times a day (BID) | NASAL | 5 refills | Status: DC | PRN

## 2019-10-22 NOTE — Progress Notes (Signed)
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 - We can get annual testing to monitor your IgE levels to see if you are losing the sensitivity. - EpiPen training reviewed. - Anaphylaxis management plan provided. - Typically I do not recommend avoiding dairy since cross reactivity is relatively rare, but it seems that you are one of the few that have reactions with cow's milk. - I would continue to avoid cow's milk since you seem to be doing so well with this.  - Consider emu if you are itching for some "red meat".   2. Seasonal and perennial allergic rhinitis - Testing today showed: grasses, weeds, trees, indoor molds, outdoor molds, dust mites, cat and dog - Copy of test results provided.  - Avoidance measures provided. - Continue with: Allegra (fexofenadine) 180mg  table once daily - Start taking: Singulair (montelukast) 10mg  daily (this can cause depression and mood changes in some people, so be aware of this) and Astelin (azelastine) two sprays per nostril up to twice daily as needed during the worst times of the year - You can consider changing her antihistamine every 2 to 3 months to see if this improves the efficacy of it. - Samples of Xyzal provided today.  - Consider allergy shots as a means of long-term control. - Allergy shots "re-train" and "reset" the immune system to ignore environmental allergens and decrease the resulting immune response to those allergens (sneezing, itchy watery eyes, runny nose, nasal congestion, etc).    - Allergy shots  improve symptoms in 75-85% of patients.  - We can discuss more at the next appointment if the medications are not working for you.  3. Return in about 3 months (around 01/22/2020). This can be an in-person, a virtual Webex or a telephone follow up visit.  Subjective:   [name] is a 63 y.o. female presenting today for evaluation of  Chief Complaint  Patient presents with  . Food Intolerance    [name] has a history of the following: Patient Active Problem List   Diagnosis Date Noted  . Encounter for gynecological examination with Papanicolaou smear of cervix 06/16/2019  . Screening for colorectal cancer 06/16/2019  . PMB (postmenopausal bleeding) 06/16/2019  . Chronic total occlusion of coronary artery 07/03/2017  . Chest pain 07/01/2017  . Abdominal pain, epigastric 10/10/2016  . Gastroesophageal reflux disease without esophagitis 10/10/2016  . GERD (gastroesophageal reflux disease) 03/02/2014  . Family hx of colon cancer 03/02/2014  . 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 Arthritis of knee 10/31/2011  . Tobacco abuse 03/28/2011  . Obesity 03/28/2011  . CAD (coronary artery disease)   . HTN (hypertension)   . Hyperlipidemia   . Anxiety     History obtained from: chart review and patient.  [name] was referred by Rory Percy, MD.     [name] is a 63 y.o. female presenting for an evaluation of food allergies and environmental allergies.  She has been having symptoms of alpha gal for years. She reports bad problems with her bowels after eating certain foods. She finally  put it all together and requested testing for alpha gal. She finally had the testing done and it came back positive. She has seen a GI doctor for years and they never even suggested the testing. She has a history of colon cancer in her father and she gets colonoscopies every ten years. She has brought this up to her GI doctor for many years and they never did the testing. She finally put it all  together a few months ago.   Since getting the labs back she has had good results. She even started avoiding dairy. But yesterday she used milk on her oatmeal and developed cramping and diarrhea. She has introduced a bit of cheese but she cannot do pizza any longer.  She is not a big yogurt eater.  She has not had a tick bite in ages. She had never had Lyme disease but she does have arthralgias and joint pains.     Allergic Rhinitis Symptom History: She does have allergic rhinitis symptoms. She has been on Allegra daily for years. She does not use a nose spray on a regular basis. She does have cats and thinks that she has an allergy to them. She has never been allergy tested in the past.  There is no time in the air that is better for her allergy.  In general, the Allegra daily seems to help.  Eczema Symptom History: She does have some eczema on her head in her hairline. She has tried changing her cosmetics to see if that helps. She does see a Paediatric nurse.   Otherwise, there is no history of other atopic diseases, including asthma, drug allergies, stinging insect allergies, urticaria or contact dermatitis. There is no significant infectious history. Vaccinations are up to date.    Past Medical History: Patient Active Problem List   Diagnosis Date Noted  . Encounter for gynecological examination with Papanicolaou smear of cervix 06/16/2019  . Screening for colorectal cancer 06/16/2019  . PMB (postmenopausal bleeding) 06/16/2019  . Chronic total occlusion of coronary artery 07/03/2017  . Chest pain 07/01/2017  . Abdominal pain, epigastric 10/10/2016  . Gastroesophageal reflux disease without esophagitis 10/10/2016  . GERD (gastroesophageal reflux disease) 03/02/2014  . Family hx of colon cancer 03/02/2014  . 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 Arthritis of knee 10/31/2011  . Tobacco abuse 03/28/2011  . Obesity 03/28/2011  . CAD (coronary artery disease)   . HTN (hypertension)   .  Hyperlipidemia   . Anxiety     Medication List:  Allergies as of 10/22/2019      Reactions   Ancef [cefazolin] Itching   Itching around mouth - sx resolved quickly after administration of benadryl; with no further sx   Ciprofloxacin Hcl    unknown   Meat [alpha-gal] Diarrhea   Metaxalone    unknown   Sulfa Antibiotics Itching, Rash, Other (See Comments)   "severe muscle cramps, tongue cracked, dry mouth "      Medication List       Accurate as of October 22, 2019 10:09 AM. If you have any questions, ask your nurse or doctor.        acetaminophen 650 MG CR tablet Commonly known as: TYLENOL Take 650 mg by mouth every 8 (eight) hours as needed for pain. Takes 2 tabs twice a day.   amLODipine 5 MG tablet Commonly known as: NORVASC Take 5 mg by mouth daily.   aspirin 81 MG tablet Take 81 mg by mouth daily.   buPROPion 150  MG 24 hr tablet Commonly known as: WELLBUTRIN XL Take 150 mg by mouth daily.   busPIRone 5 MG tablet Commonly known as: BUSPAR Take 5 mg by mouth 2 (two) times daily as needed.   DULoxetine 60 MG capsule Commonly known as: CYMBALTA Take by mouth. Takes 90 mg daily   fluticasone 50 MCG/ACT nasal spray Commonly known as: FLONASE Place into both nostrils daily.   furosemide 40 MG tablet Commonly known as: LASIX Take 1 tablet (40 mg total) by mouth as needed for edema (leg swelling).   hydrocortisone 2.5 % rectal cream Commonly known as: ANUSOL-HC Place 1 application rectally 2 (two) times daily. What changed:   when to take this  reasons to take this   isosorbide mononitrate 30 MG 24 hr tablet Commonly known as: IMDUR TAKE 1 TABLET(30 MG) BY MOUTH DAILY   losartan 25 MG tablet Commonly known as: COZAAR TAKE 1 TABLET BY MOUTH EVERY DAY   nitroGLYCERIN 0.4 MG SL tablet Commonly known as: NITROSTAT Place 1 tablet (0.4 mg total) under the tongue every 5 (five) minutes as needed for chest pain.   pantoprazole 40 MG tablet Commonly  known as: PROTONIX TAKE 1 TABLET BY MOUTH TWICE DAILY BEFORE MEALS   Repatha SureClick [id number] MG/ML Soaj Generic drug: Evolocumab Inject 140 mg as directed every 14 (fourteen) days.   zolpidem 10 MG tablet Commonly known as: AMBIEN Take 10 mg by mouth at bedtime.       Birth History: non-contributory  Developmental History: non-contributory  Past Surgical History: Past Surgical History:  Procedure Laterality Date  . CARDIAC SURGERY     stent placement  . CHOLECYSTECTOMY    . COLONOSCOPY N/A 04/01/2014   Procedure: COLONOSCOPY;  Surgeon: Rogene Houston, MD;  [facility]: [facility];  Service: Endoscopy;  Laterality: N/A;  100  . Cyst removed from spine    . ECTOPIC PREGNANCY SURGERY    . ESOPHAGOGASTRODUODENOSCOPY N/A 04/01/2014   Procedure: ESOPHAGOGASTRODUODENOSCOPY (EGD);  Surgeon: Rogene Houston, MD;  [facility]: [facility];  Service: Endoscopy;  Laterality: N/A;  100  . ESOPHAGOGASTRODUODENOSCOPY N/A 11/03/2016   Procedure: ESOPHAGOGASTRODUODENOSCOPY (EGD);  Surgeon: Rogene Houston, MD;  Location: [facility];  Service: Endoscopy;  Laterality: N/A;  2:45  . Heart stent     2012  x 2 stents  . LEFT HEART CATH AND CORONARY ANGIOGRAPHY N/A 07/03/2017   Procedure: LEFT HEART CATH AND CORONARY ANGIOGRAPHY;  Surgeon: Burnell Blanks, MD;  Location: [facility];  Service: Cardiovascular;  Laterality: N/A;  . Left knee     Arthroscopic  . Left wrist    . TONSILLECTOMY AND ADENOIDECTOMY    . TOTAL KNEE ARTHROPLASTY Left 09/26/2013   Procedure: TOTAL KNEE ARTHROPLASTY- LEFT;  Surgeon: [name]., MD;  Location: [facility];  Service: Orthopedics;  Laterality: Left;  . TUBAL LIGATION       Family History: Family History  Problem Relation Age of Onset  . Colon cancer Father   . Heart disease Mother   . Heart disease Other   . Arthritis Other   . Cancer Other   . Allergic rhinitis Neg Hx   . Angioedema Neg Hx   . Asthma Neg Hx   . Atopy Neg Hx   . Eczema Neg  Hx   . Immunodeficiency Neg Hx   . Urticaria Neg Hx      Social History: [name] lives at home with her husband.  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  She lives in a house that is 62 years old.  There is hardwood throughout the home.  She has wood heating and window units for cooling.  There are 2 cats and 2 dogs inside of the home.  She does not have dust mite covers.  She does not currently smoke tobacco.   Review of Systems  Constitutional: Negative.  Negative for chills, fever, malaise/fatigue and weight loss.  HENT: Positive for congestion and sore throat. Negative for ear discharge and ear pain.   Eyes: Negative for pain, discharge and redness.  Respiratory: Negative for cough, sputum production, shortness of breath and wheezing.   Cardiovascular: Negative.  Negative for chest pain and palpitations.  Gastrointestinal: Positive for abdominal pain, diarrhea and nausea. Negative for constipation, heartburn and vomiting.  Skin: Negative.  Negative for itching and rash.  Neurological: Negative for dizziness and headaches.  Endo/Heme/Allergies: Negative for environmental allergies. Does not bruise/bleed easily.       Objective:   Blood pressure (!) 154/86, pulse 98, temperature 98.9 F (37.2 C), temperature source Temporal, resp. rate 18, height 5' 2.6" (1.59 m), weight 199 lb 3.2 oz (90.4 kg), SpO2 96 %. Body mass index is 35.74 kg/m.   Physical Exam:   Physical Exam  Constitutional: She appears well-developed.  Very pleasant talkative female.  HENT:  Head: Normocephalic and atraumatic.  Right Ear: Tympanic membrane, external ear and ear canal normal. No drainage, swelling or tenderness. Tympanic membrane is not injected, not scarred, not erythematous, not retracted and not bulging.  Left Ear: Tympanic membrane, external ear and ear canal normal. No drainage, swelling or tenderness. Tympanic membrane is not injected, not scarred, not erythematous,  not retracted and not bulging.  Nose: Rhinorrhea present. No mucosal edema, nasal deformity or septal deviation. No epistaxis. Right sinus exhibits no maxillary sinus tenderness and no frontal sinus tenderness. Left sinus exhibits no maxillary sinus tenderness and no frontal sinus tenderness.  Mouth/Throat: Uvula is midline and oropharynx is clear and moist. Mucous membranes are not pale and not dry.  Turbinates are enlarged bilaterally.  They are erythematous with some clear discharge.  Mild cobblestoning in the posterior oropharynx.  Eyes: Pupils are equal, round, and reactive to light. Conjunctivae and EOM are normal. Right eye exhibits no chemosis and no discharge. Left eye exhibits no chemosis and no discharge. Right conjunctiva is not injected. Left conjunctiva is not injected.  Cardiovascular: Normal rate, regular rhythm and normal heart sounds.  Respiratory: Effort normal and breath sounds normal. No accessory muscle usage. No tachypnea. No respiratory distress. She has no wheezes. She has no rhonchi. She has no rales. She exhibits no tenderness.  Moving air well in all lung fields.  No increased work of breathing.  GI: There is no abdominal tenderness. There is no rebound and no guarding.  Lymphadenopathy:       Head (right side): No submandibular, no tonsillar and no occipital adenopathy present.       Head (left side): No submandibular, no tonsillar and no occipital adenopathy present.    She has no cervical adenopathy.  Neurological: She is alert.  Skin: No abrasion, no petechiae and no rash noted. Rash is not papular, not vesicular and not urticarial. No erythema. No pallor.  No eczematous or urticarial lesions noted.  There are some papules line, but they are not crusting or oozing.  Psychiatric: She has a normal mood and affect.     Diagnostic studies:  Allergy Studies:    Airborne Adult Perc - 10/22/19 0918    Time Antigen Placed  [id number]    Allergen Manufacturer  Lavella Hammock     Location  Back    Number of Test  59    Panel 1  Select    1. Control-Buffer 50% Glycerol  Negative    2. Control-Histamine 1 mg/ml  2+    3. Albumin saline  Negative    4. [place]  Negative    5. [place]  Negative    6. [place]  2+    7. [place] Blue  3+    8. Meadow Fescue  3+    9. Perennial Rye  3+    10. Sweet Vernal  Negative    11. Timothy  Negative    12. Cocklebur  Negative    13. Burweed Marshelder  Negative    14. Ragweed, short  Negative    15. Ragweed, Giant  Negative    16. Plantain,  English  Negative    17. Lamb's Quarters  Negative    18. Sheep Sorrell  Negative    19. Rough Pigweed  Negative    20. Marsh Elder, Rough  Negative    21. Mugwort, Common  Negative    22. Ash mix  Negative    23. Birch mix  Negative    24. Beech American  Negative    25. Box, Elder  Negative    26. Cedar, red  Negative    27. Cottonwood, [place]  Negative    28. Elm mix  Negative    29. Hickory mix  Negative    30. Maple mix  Negative    31. Oak, [place] mix  Negative    32. Pecan Pollen  Negative    33. Pine mix  Negative    34. Sycamore Eastern  Negative    35. [place], Black Pollen  Negative    36. Alternaria alternata  Negative    37. Cladosporium Herbarum  Negative    38. Aspergillus mix  Negative    39. Penicillium mix  Negative    40. Bipolaris sorokiniana (Helminthosporium)  Negative    41. Drechslera spicifera (Curvularia)  Negative    42. Mucor plumbeus  Negative    43. Fusarium moniliforme  Negative    44. Aureobasidium pullulans (pullulara)  Negative    45. Rhizopus oryzae  Negative    46. Botrytis cinera  Negative    47. Epicoccum nigrum  Negative    48. Phoma betae  Negative    49. Candida Albicans  Negative    50. Trichophyton mentagrophytes  Negative    51. Mite, D Farinae  5,000 AU/ml  4+    52. Mite, D Pteronyssinus  5,000 AU/ml  3+    53. Cat Hair 10,000 BAU/ml  2+    54.  Dog Epithelia  Negative    55. Mixed Feathers  Negative    56. Horse  Epithelia  Negative    57. Cockroach, German  Negative    58. Mouse  Negative    59. Tobacco Leaf  Negative     Intradermal - 10/22/19 1008    Time Antigen Placed  [id number]    Allergen Manufacturer  Lavella Hammock    Location  Arm    Number of Test  11    Intradermal  Select    Control  Negative    [place]  3+    Ragweed mix  Negative  Weed mix  1+    Tree mix  1+    Mold 1  3+    Mold 2  3+    Mold 3  3+    Mold 4  3+    Dog  3+    Cockroach  Negative       Allergy testing results were read and interpreted by myself, documented by clinical staff.         Salvatore Marvel, MD [facility]

## 2019-10-22 NOTE — Patient Instructions (Addendum)
1. 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 - We can get annual testing to monitor your IgE levels to see if you are losing the sensitivity. - EpiPen training reviewed. - Anaphylaxis management plan provided. - Typically I do not recommend avoiding dairy since cross reactivity is relatively rare, but it seems that you are one of the few that have reactions with cow's milk. - I would continue to avoid cow's milk since you seem to be doing so well with this.  - Consider emu if you are itching for some "red meat".   2. Seasonal and perennial allergic rhinitis - Testing today showed: grasses, weeds, trees, indoor molds, outdoor molds, dust mites, cat and dog - Copy of test results provided.  - Avoidance measures provided. - Continue with: Allegra (fexofenadine) 180mg  table once daily - Start taking: Singulair (montelukast) 10mg  daily (this can cause depression and mood changes in some people, so be aware of this) and Astelin (azelastine) two sprays per nostril up to twice daily as needed during the worst times of the year - You can consider changing her antihistamine every 2 to 3 months to see if this improves the efficacy of it. - Samples of Xyzal provided today.  - Consider allergy shots as a means of long-term control. - Allergy shots "re-train" and "reset" the immune system to ignore environmental allergens and decrease the resulting immune response to those allergens (sneezing, itchy watery eyes, runny nose, nasal congestion, etc).    - Allergy shots improve symptoms in 75-85% of patients.  - We can discuss more at the next appointment if the medications are not working for you.  3. Return in about 3 months (around 01/22/2020). This can be an in-person, a virtual Webex or a telephone follow up visit.   Please inform us of any Emergency Department visits, hospitalizations, or changes in symptoms. Call us before going to the  ED for breathing or allergy symptoms since we might be able to fit you in for a sick visit. Feel free to contact us anytime with any questions, problems, or concerns.  It was a pleasure to meet you today!  Websites that have reliable patient information: 1. American Academy of Asthma, Allergy, and Immunology: www.aaaai.org 2. Food Allergy Research and Education (FARE): foodallergy.org 3. Mothers of Asthmatics: http://www.asthmacommunitynetwork.org 4. American College of Allergy, Asthma, and Immunology: www.acaai.org  Like [place] on [place] and Instagram for our latest updates!      Make sure you are registered to vote! If you have moved or changed any of your contact information, you will need to get this updated before voting!  In some cases, you MAY be able to register to vote online: [email or web address]    Reducing Pollen Exposure  The American Academy of Allergy, Asthma and Immunology suggests the following steps to reduce your exposure to pollen during allergy seasons.    1. Do not hang sheets or clothing out to dry; pollen may collect on these items. 2. Do not mow lawns or spend time around freshly cut grass; mowing stirs up pollen. 3. Keep windows closed at night.  Keep car windows closed while driving. 4. Minimize morning activities outdoors, a time when pollen counts are usually at their highest. 5. Stay indoors as much as possible when pollen counts or humidity is high and on windy days when pollen tends to remain in the air longer. 6. Use air conditioning when possible.  Many air  conditioners have filters that trap the pollen spores. 7. Use a HEPA room air filter to remove pollen form the indoor air you breathe.  Control of Mold Allergen   Mold and fungi can grow on a variety of surfaces provided certain temperature and moisture conditions exist.  Outdoor molds grow on plants, decaying vegetation and soil.  The major outdoor mold, Alternaria  and Cladosporium, are found in very high numbers during hot and dry conditions.  Generally, a late Summer - Fall peak is seen for common outdoor fungal spores.  Rain will temporarily lower outdoor mold spore count, but counts rise rapidly when the rainy period ends.  The most important indoor molds are Aspergillus and Penicillium.  Dark, humid and poorly ventilated basements are ideal sites for mold growth.  The next most common sites of mold growth are the bathroom and the kitchen.  Outdoor (Seasonal) Mold Control  Positive outdoor molds via skin testing: Alternaria, Cladosporium, Bipolaris (Helminthsporium), Drechslera (Curvalaria) and Mucor  1. Use air conditioning and keep windows closed 2. Avoid exposure to decaying vegetation. 3. Avoid leaf raking. 4. Avoid grain handling. 5. Consider wearing a face mask if working in moldy areas.   Indoor (Perennial) Mold Control   Positive indoor molds via skin testing: Aspergillus, Penicillium, Fusarium, Aureobasidium (Pullulara) and Rhizopus  1. Maintain humidity below 50%. 2. Clean washable surfaces with 5% bleach solution. 3. Remove sources e.g. contaminated carpets.     Control of Dust Mite Allergen    Dust mites play a major role in allergic asthma and rhinitis.  They occur in environments with high humidity wherever human skin is found.  Dust mites absorb humidity from the atmosphere (ie, they do not drink) and feed on organic matter (including shed human and animal skin).  Dust mites are a microscopic type of insect that you cannot see with the naked eye.  High levels of dust mites have been detected from mattresses, pillows, carpets, upholstered furniture, bed covers, clothes, soft toys and any woven material.  The principal allergen of the dust mite is found in its feces.  A gram of dust may contain 1,000 mites and 250,000 fecal particles.  Mite antigen is easily measured in the air during house cleaning activities.  Dust mites do not  bite and do not cause harm to humans, other than by triggering allergies/asthma.    Ways to decrease your exposure to dust mites in your home:  1. Encase mattresses, box springs and pillows with a mite-impermeable barrier or cover   2. Wash sheets, blankets and drapes weekly in hot water (130 F) with detergent and dry them in a dryer on the hot setting.  3. Have the room cleaned frequently with a vacuum cleaner and a damp dust-mop.  For carpeting or rugs, vacuuming with a vacuum cleaner equipped with a high-efficiency particulate air (HEPA) filter.  The dust mite allergic individual should not be in a room which is being cleaned and should wait 1 hour after cleaning before going into the room. 4. Do not sleep on upholstered furniture (eg, couches).   5. If possible removing carpeting, upholstered furniture and drapery from the home is ideal.  Horizontal blinds should be eliminated in the rooms where the person spends the most time (bedroom, study, television room).  Washable vinyl, roller-type shades are optimal. 6. Remove all non-washable stuffed toys from the bedroom.  Wash stuffed toys weekly like sheets and blankets above.   7. Reduce indoor humidity to less than 50%.  Inexpensive humidity monitors can be purchased at most hardware stores.  Do not use a humidifier as can make the problem worse and are not recommended.  Control of Dog or Cat Allergen  Avoidance is the best way to manage a dog or cat allergy. If you have a dog or cat and are allergic to dog or cats, consider removing the dog or cat from the home. If you have a dog or cat but dont want to find it a new home, or if your family wants a pet even though someone in the household is allergic, here are some strategies that may help keep symptoms at bay:  1. Keep the pet out of your bedroom and restrict it to only a few rooms. Be advised that keeping the dog or cat in only one room will not limit the allergens to that room. 2. Dont pet,  hug or kiss the dog or cat; if you do, wash your hands with soap and water. 3. High-efficiency particulate air (HEPA) cleaners run continuously in a bedroom or living room can reduce allergen levels over time. 4. Regular use of a high-efficiency vacuum cleaner or a central vacuum can reduce allergen levels. 5. Giving your dog or cat a bath at least once a week can reduce airborne allergen.  Allergy Shots   Allergies are the result of a chain reaction that starts in the immune system. Your immune system controls how your body defends itself. For instance, if you have an allergy to pollen, your immune system identifies pollen as an invader or allergen. Your immune system overreacts by producing antibodies called Immunoglobulin E (IgE). These antibodies travel to cells that release chemicals, causing an allergic reaction.  The concept behind allergy immunotherapy, whether it is received in the form of shots or tablets, is that the immune system can be desensitized to specific allergens that trigger allergy symptoms. Although it requires time and patience, the payback can be long-term relief.  How Do Allergy Shots Work?  Allergy shots work much like a vaccine. Your body responds to injected amounts of a particular allergen given in increasing doses, eventually developing a resistance and tolerance to it. Allergy shots can lead to decreased, minimal or no allergy symptoms.  There generally are two phases: build-up and maintenance. Build-up often ranges from three to six months and involves receiving injections with increasing amounts of the allergens. The shots are typically given once or twice a week, though more rapid build-up schedules are sometimes used.  The maintenance phase begins when the most effective dose is reached. This dose is different for each person, depending on how allergic you are and your response to the build-up injections. Once the maintenance dose is reached, there are longer  periods between injections, typically two to four weeks.  Occasionally doctors give cortisone-type shots that can temporarily reduce allergy symptoms. These types of shots are different and should not be confused with allergy immunotherapy shots.  Who Can Be Treated with Allergy Shots?  Allergy shots may be a good treatment approach for people with allergic rhinitis (hay fever), allergic asthma, conjunctivitis (eye allergy) or stinging insect allergy.   Before deciding to begin allergy shots, you should consider:   The length of allergy season and the severity of your symptoms  Whether medications and/or changes to your environment can control your symptoms  Your desire to avoid long-term medication use  Time: allergy immunotherapy requires a major time commitment  Cost: may vary depending on your insurance coverage  Allergy shots for children age 55 and older are effective and often well tolerated. They might prevent the onset of new allergen sensitivities or the progression to asthma.  Allergy shots are not started on patients who are pregnant but can be continued on patients who become pregnant while receiving them. In some patients with other medical conditions or who take certain common medications, allergy shots may be of risk. It is important to mention other medications you talk to your allergist.   When Will I Feel Better?  Some may experience decreased allergy symptoms during the build-up phase. For others, it may take as long as 12 months on the maintenance dose. If there is no improvement after a year of maintenance, your allergist will discuss other treatment options with you.  If you arent responding to allergy shots, it may be because there is not enough dose of the allergen in your vaccine or there are missing allergens that were not identified during your allergy testing. Other reasons could be that there are high levels of the allergen in your environment or major  exposure to non-allergic triggers like tobacco smoke.  What Is the Length of Treatment?  Once the maintenance dose is reached, allergy shots are generally continued for three to five years. The decision to stop should be discussed with your allergist at that time. Some people may experience a permanent reduction of allergy symptoms. Others may relapse and a longer course of allergy shots can be considered.  What Are the Possible Reactions?  The two types of adverse reactions that can occur with allergy shots are local and systemic. Common local reactions include very mild redness and swelling at the injection site, which can happen immediately or several hours after. A systemic reaction, which is less common, affects the entire body or a particular body system. They are usually mild and typically respond quickly to medications. Signs include increased allergy symptoms such as sneezing, a stuffy nose or hives.  Rarely, a serious systemic reaction called anaphylaxis can develop. Symptoms include swelling in the throat, wheezing, a feeling of tightness in the chest, nausea or dizziness. Most serious systemic reactions develop within 30 minutes of allergy shots. This is why it is strongly recommended you wait in your doctors office for 30 minutes after your injections. Your allergist is trained to watch for reactions, and his or her staff is trained and equipped with the proper medications to identify and treat them.  Who Should Administer Allergy Shots?  The preferred location for receiving shots is your prescribing allergists office. Injections can sometimes be given at another facility where the physician and staff are trained to recognize and treat reactions, and have received instructions by your prescribing allergist.

## 2019-11-08 DIAGNOSIS — Z6837 Body mass index (BMI) 37.0-37.9, adult: Secondary | ICD-10-CM | POA: Diagnosis not present

## 2019-11-08 DIAGNOSIS — R300 Dysuria: Secondary | ICD-10-CM | POA: Diagnosis not present

## 2019-11-10 ENCOUNTER — Other Ambulatory Visit: Payer: Self-pay | Admitting: Family Medicine

## 2019-11-14 ENCOUNTER — Other Ambulatory Visit: Payer: Self-pay | Admitting: Cardiovascular Disease

## 2019-11-23 ENCOUNTER — Other Ambulatory Visit: Payer: Self-pay | Admitting: Allergy & Immunology

## 2019-12-01 DIAGNOSIS — Z20822 Contact with and (suspected) exposure to covid-19: Secondary | ICD-10-CM | POA: Diagnosis not present

## 2019-12-31 ENCOUNTER — Other Ambulatory Visit (INDEPENDENT_AMBULATORY_CARE_PROVIDER_SITE_OTHER): Payer: Self-pay | Admitting: Gastroenterology

## 2019-12-31 MED ORDER — PANTOPRAZOLE SODIUM 40 MG PO TBEC: 40.0000 mg | DELAYED_RELEASE_TABLET | Freq: Two times a day (BID) | ORAL | 1 refills | Status: DC

## 2019-12-31 NOTE — Progress Notes (Signed)
Patient last seen 2019.  Will refill her PPI but does need follow-up appointment especially given she is on 40 mg twice a day which is a fairly high dose.  Last seen over a year ago.

## 2020-01-02 ENCOUNTER — Other Ambulatory Visit: Payer: Self-pay | Admitting: *Deleted

## 2020-01-02 MED ORDER — ISOSORBIDE MONONITRATE ER 30 MG PO TB24: ORAL_TABLET | ORAL | 2 refills | Status: DC

## 2020-02-02 DIAGNOSIS — E7801 Familial hypercholesterolemia: Secondary | ICD-10-CM | POA: Diagnosis not present

## 2020-02-02 DIAGNOSIS — R5382 Chronic fatigue, unspecified: Secondary | ICD-10-CM | POA: Diagnosis not present

## 2020-02-02 DIAGNOSIS — I10 Essential (primary) hypertension: Secondary | ICD-10-CM | POA: Diagnosis not present

## 2020-02-02 DIAGNOSIS — R5383 Other fatigue: Secondary | ICD-10-CM | POA: Diagnosis not present

## 2020-02-02 DIAGNOSIS — E7800 Pure hypercholesterolemia, unspecified: Secondary | ICD-10-CM | POA: Diagnosis not present

## 2020-02-02 DIAGNOSIS — F419 Anxiety disorder, unspecified: Secondary | ICD-10-CM | POA: Diagnosis not present

## 2020-02-05 DIAGNOSIS — F411 Generalized anxiety disorder: Secondary | ICD-10-CM | POA: Diagnosis not present

## 2020-02-05 DIAGNOSIS — M79642 Pain in left hand: Secondary | ICD-10-CM | POA: Diagnosis not present

## 2020-02-05 DIAGNOSIS — I10 Essential (primary) hypertension: Secondary | ICD-10-CM | POA: Diagnosis not present

## 2020-02-05 DIAGNOSIS — M797 Fibromyalgia: Secondary | ICD-10-CM | POA: Diagnosis not present

## 2020-02-11 ENCOUNTER — Telehealth: Payer: Self-pay | Admitting: *Deleted

## 2020-02-11 NOTE — Telephone Encounter (Signed)
Fax received from [facility] [place] does not require authorization for this member.

## 2020-02-27 ENCOUNTER — Ambulatory Visit: Payer: Self-pay | Attending: Internal Medicine

## 2020-02-27 DIAGNOSIS — Z23 Encounter for immunization: Secondary | ICD-10-CM

## 2020-02-27 NOTE — Progress Notes (Signed)
   Covid-19 Vaccination Clinic  Name:  [name]    MRN: [id number] DOB: 04-Jul-1956  02/27/2020  Ms. Pharr was observed post Covid-19 immunization for 15 minutes without incident. She was provided with Vaccine Information Sheet and instruction to access the V-Safe system.   Ms. Heilmann was instructed to call 911 with any severe reactions post vaccine: Marland Kitchen Difficulty breathing  . Swelling of face and throat  . A fast heartbeat  . A bad rash all over body  . Dizziness and weakness   Immunizations Administered    Name Date Dose VIS Date Route   Moderna COVID-19 Vaccine 02/27/2020 11:52 AM 0.5 mL 10/28/2019 Intramuscular   Manufacturer: Moderna   Lot: [id number]   [id number]

## 2020-03-22 DIAGNOSIS — N390 Urinary tract infection, site not specified: Secondary | ICD-10-CM | POA: Diagnosis not present

## 2020-03-22 DIAGNOSIS — Z6838 Body mass index (BMI) 38.0-38.9, adult: Secondary | ICD-10-CM | POA: Diagnosis not present

## 2020-03-31 ENCOUNTER — Ambulatory Visit: Payer: Self-pay

## 2020-04-15 DIAGNOSIS — Z6838 Body mass index (BMI) 38.0-38.9, adult: Secondary | ICD-10-CM | POA: Diagnosis not present

## 2020-04-15 DIAGNOSIS — R1011 Right upper quadrant pain: Secondary | ICD-10-CM | POA: Diagnosis not present

## 2020-04-15 DIAGNOSIS — R109 Unspecified abdominal pain: Secondary | ICD-10-CM | POA: Diagnosis not present

## 2020-06-04 ENCOUNTER — Other Ambulatory Visit: Payer: Self-pay | Admitting: *Deleted

## 2020-06-04 DIAGNOSIS — I779 Disorder of arteries and arterioles, unspecified: Secondary | ICD-10-CM

## 2020-06-09 ENCOUNTER — Telehealth: Payer: Self-pay | Admitting: Family Medicine

## 2020-06-09 NOTE — Telephone Encounter (Signed)
Pre-cert Verification for the following procedure    Cartoid Dopplers  DATE:    06/15/2020  LOCATION: [facility]

## 2020-06-14 ENCOUNTER — Encounter (INDEPENDENT_AMBULATORY_CARE_PROVIDER_SITE_OTHER): Payer: Self-pay | Admitting: Gastroenterology

## 2020-06-14 ENCOUNTER — Ambulatory Visit (INDEPENDENT_AMBULATORY_CARE_PROVIDER_SITE_OTHER): Payer: BC Managed Care – PPO | Admitting: Gastroenterology

## 2020-06-14 ENCOUNTER — Other Ambulatory Visit: Payer: Self-pay

## 2020-06-14 VITALS — BP 130/81 | HR 98 | Temp 98.0°F | Ht 66.0 in | Wt 197.0 lb

## 2020-06-14 DIAGNOSIS — Z86010 Personal history of colonic polyps: Secondary | ICD-10-CM | POA: Diagnosis not present

## 2020-06-14 DIAGNOSIS — R1319 Other dysphagia: Secondary | ICD-10-CM

## 2020-06-14 DIAGNOSIS — K219 Gastro-esophageal reflux disease without esophagitis: Secondary | ICD-10-CM | POA: Diagnosis not present

## 2020-06-14 MED ORDER — PROCTOFOAM HC 1-1 % EX FOAM: 1.0000 | Freq: Two times a day (BID) | CUTANEOUS | 1 refills | Status: DC

## 2020-06-14 NOTE — Patient Instructions (Signed)
We are scheduling endoscopy/colonsocopy for evaluation

## 2020-06-14 NOTE — Progress Notes (Signed)
Patient profile: [name] is a 64 y.o. female seen for f/up. Last seen in 09/2018 for GERD.   History of Present Illness: [name] is seen today for evaluation   Since her last visit she reports self diagnosing herself w/ alpha gal - previously having 10-20 BM/day when eating red meat in past. Seems improved - went over 1 year w/o red meat and is slowly re-introducing without symptoms.   Typically having 2-3 BM/day. She is taking plexus (wt loss supplement with probiotic). She reports chronic rectal bleeding w/ wiping related to hemorrhoid, this seems better w/ protocofoam cream in the past but has been problematic since using Anusol.  In addition to small volume rectal bleeding having burning/itching daily. Hemorrhoids have been problematic since childbirth at age 35.  These seem flared after recent bout of diarrhea related to possible food poisoning.  Acute diarrhea is now resolved.  She is taking protonix 40mg  twice a day-reports when eating developing at times a squeezing sensation in esophagus like a spasm, occurs when eating about 1x/week, has to stop eating. Usually occurs w/ foods in upper esophageal area. No liquids dysphagia. No GERD/burning. No nausea/vomiting.   No nsaid use. Former smoker. Alcohol 2-4x/week   Wt Readings from Last 3 Encounters:  06/14/20 197 lb (89.4 kg)  10/22/19 199 lb 3.2 oz (90.4 kg)  10/21/19 199 lb 3.2 oz (90.4 kg)     Last Colonoscopy: 2015-father with history of colon cancer. Prep excellent. Small cecal polyp located across from ileocecal valve. It was ablated via cold biopsy. Single small diverticulum noted at hepatic flexure with few more at sigmoid colon. Normal rectal mucosa. Small hemorrhoids below the dentate line  Patient had small cecal polyp removed and it is sessile serrated polyp. 5 year repeat    Last Endoscopy:  2017-- Normal esophagus.                           - Z-line irregular, 31 cm from the incisors.                            - 4 cm hiatal hernia.                           - Gastritis. Biopsied.                           - Normal second portion of the duodenum. Biopsied.  Past Medical History:  Past Medical History:  Diagnosis Date  . Anxiety   . CAD (coronary artery disease)    PCI Stent RCA 03/12/11. Cath 03/22/11 LVEF 55-60%. RCA patent  proximal/distal stents with 30% proximal disease and 20% mid stenosis. LCX with 30% mid stenosis. Questionable spasm. Cath 07/03/17 CTO of RCA  . Carotid stenosis    Less than 50% 8/11  . Chronic total occlusion of coronary artery 07/03/2017   CTO of RCA  . GERD (gastroesophageal reflux disease) 03/02/2014  . H/O hiatal hernia   . HTN (hypertension)   . Hyperlipidemia   . Myocardial infarction ([place])   . Renal disorder    kidney stone  . Urticaria     Problem List: Patient Active Problem List   Diagnosis Date Noted  . Encounter for gynecological examination with Papanicolaou smear of cervix 06/16/2019  . Screening for colorectal cancer  06/16/2019  . PMB (postmenopausal bleeding) 06/16/2019  . Chronic total occlusion of coronary artery 07/03/2017  . Chest pain 07/01/2017  . Abdominal pain, epigastric 10/10/2016  . Gastroesophageal reflux disease without esophagitis 10/10/2016  . GERD (gastroesophageal reflux disease) 03/02/2014  . Family hx of colon cancer 03/02/2014  . 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 Arthritis of knee 10/31/2011  . Tobacco abuse 03/28/2011  . Obesity 03/28/2011  . CAD (coronary artery disease)   . HTN (hypertension)   . Hyperlipidemia   . Anxiety     Past Surgical History: Past Surgical History:  Procedure Laterality Date  . CARDIAC SURGERY     stent placement  . CHOLECYSTECTOMY    . COLONOSCOPY N/A 04/01/2014   Procedure: COLONOSCOPY;  Surgeon: Rogene Houston, MD;  [facility];  Service: Endoscopy;  Laterality: N/A;  100  . Cyst removed from spine    . ECTOPIC PREGNANCY SURGERY    . ESOPHAGOGASTRODUODENOSCOPY N/A  04/01/2014   Procedure: ESOPHAGOGASTRODUODENOSCOPY (EGD);  Surgeon: Rogene Houston, MD;  [facility];  Service: Endoscopy;  Laterality: N/A;  100  . ESOPHAGOGASTRODUODENOSCOPY N/A 11/03/2016   Procedure: ESOPHAGOGASTRODUODENOSCOPY (EGD);  Surgeon: Rogene Houston, MD;  [facility];  Service: Endoscopy;  Laterality: N/A;  2:45  . Heart stent     2012  x 2 stents  . LEFT HEART CATH AND CORONARY ANGIOGRAPHY N/A 07/03/2017   Procedure: LEFT HEART CATH AND CORONARY ANGIOGRAPHY;  Surgeon: Burnell Blanks, MD;  Location: [facility];  Service: Cardiovascular;  Laterality: N/A;  . Left knee     Arthroscopic  . Left wrist    . TONSILLECTOMY AND ADENOIDECTOMY    . TOTAL KNEE ARTHROPLASTY Left 09/26/2013   Procedure: TOTAL KNEE ARTHROPLASTY- LEFT;  Surgeon: [name]., MD;  Location: [facility];  Service: Orthopedics;  Laterality: Left;  . TUBAL LIGATION      Allergies: Allergies  Allergen Reactions  . Ancef [Cefazolin] Itching    Itching around mouth - sx resolved quickly after administration of benadryl; with no further sx  . Ciprofloxacin Hcl     unknown  . 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 Metaxalone     unknown  . Sulfa Antibiotics Itching, Rash and Other (See Comments)    "severe muscle cramps, tongue cracked, dry mouth "      Home Medications:  Current Outpatient Medications:  .  acetaminophen (TYLENOL) 650 MG CR tablet, Take 650 mg by mouth every 8 (eight) hours as needed for pain. Takes 2 tabs twice a day., Disp: , Rfl:  .  amLODipine (NORVASC) 5 MG tablet, Take 5 mg by mouth daily., Disp: , Rfl:  .  aspirin 81 MG tablet, Take 81 mg by mouth daily., Disp: , Rfl:  .  busPIRone (BUSPAR) 5 MG tablet, Take 5 mg by mouth 2 (two) times daily as needed., Disp: , Rfl:  .  DULoxetine (CYMBALTA) 60 MG capsule, Take by mouth. Takes 90 mg daily, Disp: , Rfl: 3 .  Evolocumab (REPATHA SURECLICK) 831 MG/ML SOAJ, Inject 140 mg as directed every 14 (fourteen) days.,  Disp: 6 mL, Rfl: 3 .  furosemide (LASIX) 40 MG tablet, Take 1 tablet (40 mg total) by mouth as needed for edema (leg swelling)., Disp: , Rfl:  .  hydrocortisone (ANUSOL-HC) 2.5 % rectal cream, Place 1 application rectally 2 (two) times daily. (Patient taking differently: Place 1 application rectally as needed. ), Disp: 30 g, Rfl: 1 .  losartan (COZAAR) 25 MG tablet, TAKE 1 TABLET BY MOUTH EVERY DAY, Disp: 90 tablet, Rfl: 2 .  nitroGLYCERIN (NITROSTAT) 0.4 MG SL tablet, Place 1 tablet (0.4 mg total) under the tongue every 5 (five) minutes as needed for chest pain., Disp: 25 tablet, Rfl: 3 .  pantoprazole (PROTONIX) 40 MG tablet, Take 1 tablet (40 mg total) by mouth 2 (two) times daily before a meal., Disp: 60 tablet, Rfl: 1 .  zolpidem (AMBIEN) 10 MG tablet, Take 10 mg by mouth at bedtime. , Disp: , Rfl: 3 .  azelastine (ASTELIN) 0.1 % nasal spray, Place 2 sprays into both nostrils 2 (two) times daily as needed for rhinitis. Use in each nostril as directed (Patient not taking: Reported on 06/14/2020), Disp: 30 mL, Rfl: 5 .  buPROPion (WELLBUTRIN XL) 150 MG 24 hr tablet, Take 150 mg by mouth daily. (Patient not taking: Reported on 06/14/2020), Disp: , Rfl:  .  fluticasone (FLONASE) 50 MCG/ACT nasal spray, Place into both nostrils daily. (Patient not taking: Reported on 06/14/2020), Disp: , Rfl:  .  hydrocortisone-pramoxine (PROCTOFOAM HC) rectal foam, Place 1 applicator rectally 2 (two) times daily., Disp: 20 g, Rfl: 1 .  isosorbide mononitrate (IMDUR) 30 MG 24 hr tablet, TAKE 1 TABLET(30 MG) BY MOUTH DAILY, Disp: 90 tablet, Rfl: 2 .  levocetirizine (XYZAL) 5 MG tablet, TAKE 1 TABLET(5 MG) BY MOUTH EVERY EVENING (Patient not taking: Reported on 06/14/2020), Disp: 30 tablet, Rfl: 5 .  montelukast (SINGULAIR) 10 MG tablet, TAKE 1 TABLET(10 MG) BY MOUTH AT BEDTIME (Patient not taking: Reported on 06/14/2020), Disp: 30 tablet, Rfl: 5   Family History: family history includes Arthritis in an other family member;  Cancer in an other family member; Colon cancer in her father; Heart disease in her mother and another family member.    Social History:   reports that she quit smoking about 9 years ago. Her smoking use included cigarettes. She started smoking about 40 years ago. She has a 10.00 pack-year smoking history. She has never used smokeless tobacco. She reports current alcohol use. She reports that she does not use drugs.   Review of Systems: Constitutional: Denies weight loss/weight gain  Eyes: No changes in vision. ENT: No oral lesions, sore throat.  GI: see HPI.  Heme/Lymph: No easy bruising.  CV: No chest pain.  GU: No hematuria.  Integumentary: No rashes.  Neuro: No headaches.  Psych: No depression/anxiety.  Endocrine: No heat/cold intolerance.  Allergic/Immunologic: No urticaria.  Resp: No cough, SOB.  Musculoskeletal: No joint swelling.    Physical Examination: BP 130/81 (BP Location: Left Arm, Patient Position: Sitting, Cuff Size: Normal)   Pulse 98   Temp 98 F (36.7 C) (Oral)   Ht 5\' 6"  (1.676 m)   Wt 197 lb (89.4 kg)   BMI 31.80 kg/m  Gen: NAD, alert and oriented x 4 HEENT: PEERLA, EOMI, Neck: supple, no JVD Chest: CTA bilaterally, no wheezes, crackles, or other adventitious sounds CV: RRR, no m/g/c/r Abd: soft, NT, ND, +BS in all four quadrants; no HSM, guarding, ridigity, or rebound tenderness Rectal-hemorrhoid skin tags without evidence of thrombosis.  Unremarkable DRE.  No evidence of fissure. Ext: no edema, well perfused with 2+ pulses, Skin: no rash or lesions noted on observed skin Lymph: no noted LAD  Data Reviewed:  08/2019--alpha gal lab positive   Assessment/Plan: Ms. Israelson is a 64 y.o. female seen for follow-up of GERD, dysphagia, history of colon  1.  GERD/dysphagia-she is on PPI twice a  day and would like to continue, if no evidence of esophagitis on EGD consider decreasing to once a day.  She is reporting some dysphagia/esophageal spasm - schedule  endoscopy to rule out stricture etc. If unremarkable would consider barium swallow for further evaluation.  2.  History of colon polyps-last colonoscopy 2015.  Due for colonoscopy.  She denies prior issues with sedation.   3.  Hemorrhoids-chronic, rectal exam without evidence of thrombosis.  She feels Proctofoam works better than Anusol and will send refill to pharmacy.   Patient denies CP, SOB, and use of blood thinners. I discussed the risks and benefits of procedure including bleeding, perforation, infection, missed lesions, medication reactions and possible hospitalization or surgery if complications. All questions answered.   Diagnoses and all orders for this visit:  Other dysphagia  History of colonic polyps  Gastroesophageal reflux disease without esophagitis  Other orders -     hydrocortisone-pramoxine (PROCTOFOAM HC) rectal foam; Place 1 applicator rectally 2 (two) times daily.        I personally performed the service, non-incident to. (WP)  [name], [facility]

## 2020-06-15 ENCOUNTER — Telehealth (INDEPENDENT_AMBULATORY_CARE_PROVIDER_SITE_OTHER): Payer: Self-pay | Admitting: *Deleted

## 2020-06-15 ENCOUNTER — Encounter (INDEPENDENT_AMBULATORY_CARE_PROVIDER_SITE_OTHER): Payer: Self-pay | Admitting: *Deleted

## 2020-06-15 ENCOUNTER — Other Ambulatory Visit (INDEPENDENT_AMBULATORY_CARE_PROVIDER_SITE_OTHER): Payer: Self-pay | Admitting: *Deleted

## 2020-06-15 ENCOUNTER — Ambulatory Visit (HOSPITAL_COMMUNITY)
Admission: RE | Admit: 2020-06-15 | Discharge: 2020-06-15 | Disposition: A | Discharge status: Home / Self Care | Payer: BC Managed Care – PPO | Source: Ambulatory Visit | Attending: Family Medicine | Admitting: Family Medicine

## 2020-06-15 DIAGNOSIS — I779 Disorder of arteries and arterioles, unspecified: Secondary | ICD-10-CM | POA: Diagnosis not present

## 2020-06-15 DIAGNOSIS — I771 Stricture of artery: Secondary | ICD-10-CM | POA: Diagnosis not present

## 2020-06-15 DIAGNOSIS — I6523 Occlusion and stenosis of bilateral carotid arteries: Secondary | ICD-10-CM | POA: Diagnosis not present

## 2020-06-15 MED ORDER — SUTAB 1479-225-188 MG PO TABS
1.0000 | ORAL_TABLET | Freq: Once | ORAL | 0 refills | Status: AC
Start: 2020-06-15 — End: 2020-06-15

## 2020-06-15 NOTE — Telephone Encounter (Signed)
Patient needs Sutab (copay card) ° °

## 2020-06-16 ENCOUNTER — Telehealth: Payer: Self-pay | Admitting: *Deleted

## 2020-06-16 NOTE — Telephone Encounter (Signed)
-----   Message from Verta Ellen., NP sent at 06/15/2020  4:11 PM EDT ----- Please call the patient and let her know that the carotid artery study essentially showed no significant disease of either the left or right internal carotid arteries.  Everything looks good.  Thanks

## 2020-06-16 NOTE — Telephone Encounter (Signed)
[name], LPN  0/81/4481 8:56 AM EDT Back to Top    Notified, copy to pcp.

## 2020-06-28 DIAGNOSIS — B974 Respiratory syncytial virus as the cause of diseases classified elsewhere: Secondary | ICD-10-CM | POA: Diagnosis not present

## 2020-06-28 DIAGNOSIS — Z20828 Contact with and (suspected) exposure to other viral communicable diseases: Secondary | ICD-10-CM | POA: Diagnosis not present

## 2020-06-28 DIAGNOSIS — R05 Cough: Secondary | ICD-10-CM | POA: Diagnosis not present

## 2020-06-28 DIAGNOSIS — J069 Acute upper respiratory infection, unspecified: Secondary | ICD-10-CM | POA: Diagnosis not present

## 2020-07-12 ENCOUNTER — Other Ambulatory Visit: Payer: Self-pay | Admitting: *Deleted

## 2020-07-12 MED ORDER — REPATHA SURECLICK 140 MG/ML ~~LOC~~ SOAJ: 140.0000 mg | SUBCUTANEOUS | 3 refills | Status: DC

## 2020-07-14 DIAGNOSIS — R0789 Other chest pain: Secondary | ICD-10-CM | POA: Diagnosis not present

## 2020-07-14 DIAGNOSIS — Z6837 Body mass index (BMI) 37.0-37.9, adult: Secondary | ICD-10-CM | POA: Diagnosis not present

## 2020-07-26 ENCOUNTER — Other Ambulatory Visit (HOSPITAL_COMMUNITY): Payer: BC Managed Care – PPO

## 2020-07-27 DIAGNOSIS — L570 Actinic keratosis: Secondary | ICD-10-CM | POA: Diagnosis not present

## 2020-07-27 DIAGNOSIS — Z85828 Personal history of other malignant neoplasm of skin: Secondary | ICD-10-CM | POA: Diagnosis not present

## 2020-07-28 ENCOUNTER — Other Ambulatory Visit: Payer: Self-pay | Admitting: *Deleted

## 2020-07-28 MED ORDER — LOSARTAN POTASSIUM 25 MG PO TABS: 25.0000 mg | ORAL_TABLET | Freq: Every day | ORAL | 0 refills | Status: DC

## 2020-08-03 ENCOUNTER — Other Ambulatory Visit (HOSPITAL_COMMUNITY): Payer: BC Managed Care – PPO

## 2020-08-03 ENCOUNTER — Encounter (HOSPITAL_COMMUNITY): Payer: BC Managed Care – PPO

## 2020-08-04 ENCOUNTER — Encounter (HOSPITAL_COMMUNITY): Admission: AC | Payer: Self-pay | Source: Home / Self Care

## 2020-08-04 ENCOUNTER — Ambulatory Visit (HOSPITAL_COMMUNITY)
Admission: AC | Admit: 2020-08-04 | Payer: BC Managed Care – PPO | Source: Home / Self Care | Admitting: Internal Medicine

## 2020-08-04 SURGERY — COLONOSCOPY WITH PROPOFOL
Anesthesia: Monitor Anesthesia Care

## 2020-08-30 DIAGNOSIS — M79642 Pain in left hand: Secondary | ICD-10-CM | POA: Diagnosis not present

## 2020-08-30 DIAGNOSIS — Z23 Encounter for immunization: Secondary | ICD-10-CM | POA: Diagnosis not present

## 2020-08-30 DIAGNOSIS — M797 Fibromyalgia: Secondary | ICD-10-CM | POA: Diagnosis not present

## 2020-08-30 DIAGNOSIS — F411 Generalized anxiety disorder: Secondary | ICD-10-CM | POA: Diagnosis not present

## 2020-08-30 DIAGNOSIS — I10 Essential (primary) hypertension: Secondary | ICD-10-CM | POA: Diagnosis not present

## 2020-08-30 DIAGNOSIS — M545 Low back pain, unspecified: Secondary | ICD-10-CM | POA: Diagnosis not present

## 2020-08-30 DIAGNOSIS — M79641 Pain in right hand: Secondary | ICD-10-CM | POA: Diagnosis not present

## 2020-09-27 ENCOUNTER — Other Ambulatory Visit: Payer: Self-pay | Admitting: *Deleted

## 2020-09-27 MED ORDER — ISOSORBIDE MONONITRATE ER 30 MG PO TB24: ORAL_TABLET | ORAL | 1 refills | Status: DC

## 2020-09-28 ENCOUNTER — Encounter: Payer: Self-pay | Admitting: Cardiology

## 2020-09-28 ENCOUNTER — Ambulatory Visit: Payer: BC Managed Care – PPO | Admitting: Cardiology

## 2020-09-28 VITALS — BP 168/98 | HR 100 | Ht 62.0 in | Wt 198.0 lb

## 2020-09-28 DIAGNOSIS — I10 Essential (primary) hypertension: Secondary | ICD-10-CM

## 2020-09-28 DIAGNOSIS — I25119 Atherosclerotic heart disease of native coronary artery with unspecified angina pectoris: Secondary | ICD-10-CM

## 2020-09-28 DIAGNOSIS — E782 Mixed hyperlipidemia: Secondary | ICD-10-CM | POA: Diagnosis not present

## 2020-09-28 NOTE — Patient Instructions (Addendum)
Medication Instructions:   Your physician recommends that you continue on your current medications as directed. Please refer to the Current Medication list given to you today.  Labwork:  None  Testing/Procedures:  None  Follow-Up:  Your physician recommends that you schedule a follow-up appointment in: 6 months.  Your physician recommends that you schedule a follow-up appointment in: 2 weeks for a nurse BP check.  Any Other Special Instructions Will Be Listed Below (If Applicable). Your physician has requested that you regularly monitor and record your blood pressure readings at home for the next 2 weeks. Please use the same machine at the same time of day to check your readings and record them to bring to your follow-up visit.  If you need a refill on your cardiac medications before your next appointment, please call your pharmacy.

## 2020-09-28 NOTE — Progress Notes (Signed)
Cardiology Office Note  Date: 09/28/2020   ID: Cherryl, Babin 09/12/1956, MRN [id number]  PCP:  Rory Percy, MD  Cardiologist:  Rozann Lesches, MD Electrophysiologist:  None   Chief Complaint  Patient presents with  . Cardiac follow-up    History of Present Illness: [name] is a 64 y.o. female former patient of Dr. Bronson Ing now presenting to establish follow-up with me.  I reviewed her records and updated the chart.  She was last seen in November 2020.  She presents today for a routine visit.  She does not report any recurrent chest pain or nitroglycerin use.  She does tell me that her blood pressure has been elevated, Cozaar was increased by her PCP.  She has also been under a lot of stress recently.  I reviewed her recent lab work as noted below.  LDL was 94.  She reports compliance with Repatha.  I personally reviewed her ECG today which shows sinus rhythm with old inferior infarct pattern.  I reviewed the remainder of her medications which are outlined below.   Past Medical History:  Diagnosis Date  . Acute ST elevation myocardial infarction (STEMI) of inferior wall ([place]) 2012  . Anxiety   . CAD (coronary artery disease)    DES to the proximal and distal RCA April 2012 in the setting of inferior STEMI; occluded mid to distal RCA with left-to-right collaterals 2018, managed medically  . Carotid stenosis    Less than 50% 8/11  . Essential hypertension   . GERD (gastroesophageal reflux disease) 03/02/2014  . H/O hiatal hernia   . Hyperlipidemia   . Nephrolithiasis   . Urticaria     Past Surgical History:  Procedure Laterality Date  . CARDIAC SURGERY     stent placement  . CHOLECYSTECTOMY    . COLONOSCOPY N/A 04/01/2014   Procedure: COLONOSCOPY;  Surgeon: Rogene Houston, MD;  [facility];  Service: Endoscopy;  Laterality: N/A;  100  . Cyst removed from spine    . ECTOPIC PREGNANCY SURGERY    . ESOPHAGOGASTRODUODENOSCOPY N/A 04/01/2014     Procedure: ESOPHAGOGASTRODUODENOSCOPY (EGD);  Surgeon: Rogene Houston, MD;  [facility];  Service: Endoscopy;  Laterality: N/A;  100  . ESOPHAGOGASTRODUODENOSCOPY N/A 11/03/2016   Procedure: ESOPHAGOGASTRODUODENOSCOPY (EGD);  Surgeon: Rogene Houston, MD;  [facility];  Service: Endoscopy;  Laterality: N/A;  2:45  . Heart stent     2012  x 2 stents  . LEFT HEART CATH AND CORONARY ANGIOGRAPHY N/A 07/03/2017   Procedure: LEFT HEART CATH AND CORONARY ANGIOGRAPHY;  Surgeon: Burnell Blanks, MD;  Location: [facility];  Service: Cardiovascular;  Laterality: N/A;  . Left knee     Arthroscopic  . Left wrist    . TONSILLECTOMY AND ADENOIDECTOMY    . TOTAL KNEE ARTHROPLASTY Left 09/26/2013   Procedure: TOTAL KNEE ARTHROPLASTY- LEFT;  Surgeon: [name]., MD;  Location: [facility];  Service: Orthopedics;  Laterality: Left;  . TUBAL LIGATION      Current Outpatient Medications  Medication Sig Dispense Refill  . acetaminophen (TYLENOL) 650 MG CR tablet Take 650 mg by mouth every 8 (eight) hours as needed for pain. Takes 2 tabs twice a day.    Marland Kitchen amLODipine (NORVASC) 5 MG tablet Take 5 mg by mouth daily.    Marland Kitchen aspirin 81 MG tablet Take 81 mg by mouth daily.    . busPIRone (BUSPAR) 5 MG tablet  Take 5 mg by mouth 2 (two) times daily as needed.    . DULoxetine (CYMBALTA) 60 MG capsule Take by mouth. Takes 90 mg daily  3  . Evolocumab (REPATHA SURECLICK) 875 MG/ML SOAJ Inject 140 mg as directed every 14 (fourteen) days. 6 mL 3  . furosemide (LASIX) 40 MG tablet Take 1 tablet (40 mg total) by mouth as needed for edema (leg swelling).    . hydrocortisone (ANUSOL-HC) 2.5 % rectal cream Place 1 application rectally 2 (two) times daily. (Patient taking differently: Place 1 application rectally as needed. ) 30 g 1  . isosorbide mononitrate (IMDUR) 30 MG 24 hr tablet TAKE 1 TABLET(30 MG) BY MOUTH DAILY 90 tablet 1  . losartan (COZAAR) 50 MG tablet Take 50 mg by mouth daily.     . nitroGLYCERIN (NITROSTAT) 0.4 MG SL tablet Place 1 tablet (0.4 mg total) under the tongue every 5 (five) minutes as needed for chest pain. 25 tablet 3  . pantoprazole (PROTONIX) 40 MG tablet Take 1 tablet (40 mg total) by mouth 2 (two) times daily before a meal. 60 tablet 1  . zolpidem (AMBIEN) 10 MG tablet Take 10 mg by mouth at bedtime.   3   No current facility-administered medications for this visit.   Allergies:  Ancef [cefazolin], Ciprofloxacin hcl, Meat [alpha-gal], Metaxalone, and Sulfa antibiotics   ROS: No palpitations or syncope.  Physical Exam: VS:  BP (!) 168/98   Pulse 100   Ht 5\' 2"  (1.575 m)   Wt 198 lb (89.8 kg)   SpO2 98%   BMI 36.21 kg/m , BMI Body mass index is 36.21 kg/m.  Wt Readings from Last 3 Encounters:  09/28/20 198 lb (89.8 kg)  06/14/20 197 lb (89.4 kg)  10/22/19 199 lb 3.2 oz (90.4 kg)    General: Patient appears comfortable at rest. HEENT: Conjunctiva and lids normal, wearing a mask. Neck: Supple, no elevated JVP or carotid bruits, no thyromegaly. Lungs: Clear to auscultation, nonlabored breathing at rest. Cardiac: Regular rate and rhythm, no S3 or significant systolic murmur, no pericardial rub. Extremities: No pitting edema, distal pulses 2+.  ECG:  An ECG dated 10/21/2019 was personally reviewed today and demonstrated:  Normal sinus rhythm.  Recent Labwork:  October 2021: BUN 8, creatinine 0.67, potassium 4.2, AST 24, ALT 21, hemoglobin 13.2, platelets 359, TSH 1.25, cholesterol 182, triglycerides 164, HDL 60, LDL 94  Other Studies Reviewed Today:  Cardiac catheterization 07/03/2017:  Ost RCA to Prox RCA lesion, 40 %stenosed.  Mid RCA to Dist RCA lesion, 100 %stenosed.  Prox RCA to Mid RCA lesion, 80 %stenosed.  Ost LAD to Prox LAD lesion, 30 %stenosed.  The left ventricular systolic function is normal.  LV end diastolic pressure is normal.  The left ventricular ejection fraction is 55-65% by visual estimate.  There is no  mitral valve regurgitation.   1. Severe single vessel CAD  2. The RCA has diffuse mild disease prior to the proximal stent. The proximal to mid stented segment is patent with severe stenosis in the distal segment of the stent. The mid to distal vessel is occluded prior to the distal stent. The distal branches fill from left to right collaterals.  3. Mild non-obstructive disease in the proximal to mid LAD 4. Patent Circumflex with no obstructive disease.  5. Normal LV systolic function  Recommendations: Medical management of CAD for now. I have reviewed her case with Dr. Martinique and discussed options for potential CTO PCI. He feels that she  would be a candidate for PCI of the CTO if she has angina on optimal medical therapy.  Carotid Doppler 06/15/2020: IMPRESSION: Color duplex indicates minimal heterogeneous plaque, with no hemodynamically significant stenosis by duplex criteria in the extracranial cerebrovascular circulation.  Assessment and Plan:  1.  CAD status post inferior STEMI in 2012 treated with DES to the proximal and distal RCA.  More recent cardiac catheterization in 2018 revealed occlusion of the mid to distal RCA with left-to-right collaterals and medical therapy has been pursued in the absence of progressive angina symptoms.  ECG reviewed and consistent with old inferior infarct.  Continue aspirin, Norvasc, Imdur, losartan, and Repatha.  2.  Essential hypertension, blood pressure has not been optimally controlled recently per discussion with the patient.  She tells me that losartan was increased to 50 mg daily by her PCP.  I asked her to check blood pressure with home automatic cuff over the next few weeks and present for a nurse blood pressure check.  She may need further up titration.  In the past she had worsening swelling on higher dose of Norvasc however.  3.  Mixed hyperlipidemia with statin intolerance.  Continue Repatha.  Also discussed diet and weight loss.  Medication  Adjustments/Labs and Tests Ordered: Current medicines are reviewed at length with the patient today.  Concerns regarding medicines are outlined above.   Tests Ordered: Orders Placed This Encounter  Procedures  . EKG 12-Lead    Medication Changes: No orders of the defined types were placed in this encounter.   Disposition:  Follow up 6 months.  Signed, Satira Sark, MD, [facility] 09/28/2020 7:34 PM    [facility] [place], [place], [place] [address] Phone: [phone]; Fax: [phone]

## 2020-10-12 ENCOUNTER — Ambulatory Visit (INDEPENDENT_AMBULATORY_CARE_PROVIDER_SITE_OTHER): Payer: BC Managed Care – PPO | Admitting: *Deleted

## 2020-10-12 VITALS — BP 136/84 | HR 89

## 2020-10-12 DIAGNOSIS — I10 Essential (primary) hypertension: Secondary | ICD-10-CM | POA: Diagnosis not present

## 2020-10-12 NOTE — Progress Notes (Signed)
Noted.  Let's plan to increase Cozaar to 50 mg in the morning and 25 mg in the evening.  Check BMET in 7 to 10 days.

## 2020-10-12 NOTE — Progress Notes (Signed)
Patient in office this evening for BP check -   136/84  89  94%  Patient brought in log of readings - see listed below:  11/3 - 11:17 am before meds - 148/98  91             1:57 pm after meds - 130/84  100  11/4 - 9:40 am before meds - just woke up - 147/93 86            After med & cup coffee - 161/92  83            1:25 pm - 134/93  87             7:50 pm - 130/81  100  11/5 - 9:57 am before meds - 138/102  92  11/6 - 10:50 am before meds - 144/84  93   11/7 - 8:07 am before meds - 147/97           8:40 am 30 min's after meal - 157/94  80           5:05 pm - 145/93  86  11/8 - 9:20 am - 143/98  93            10:25 am - 142/91  95             2:41 pm - 131/81  97  11/9 - 8:47 am - 148/95  89            2:25 pm - 120/85  99  11/10 - 10:27 am - 135/90  95  11/11 - 10:48 am - 151/85  87               2:35 pm - 145/85  96               4:35 pm - 139/78  90  11/12 - 10:42 am - 142/90  86  11/13 - before med - 148/93              10:32 am - 148/93  92  11/14 - 11:59 am before med - 156/87  87              12:14 pm - 152/104  81  11/15- 10:09 am - 140/89  85                1:58 pm - 136/81  97    11/16 - 3:09 pm - just prior to coming over for nurse              Visit - 128/72  102

## 2020-10-14 MED ORDER — LOSARTAN POTASSIUM 25 MG PO TABS: 25.0000 mg | ORAL_TABLET | Freq: Every evening | ORAL | 6 refills | Status: DC

## 2020-10-14 MED ORDER — LOSARTAN POTASSIUM 50 MG PO TABS
50.0000 mg | ORAL_TABLET | Freq: Every morning | ORAL | Status: DC
Start: 2020-10-14 — End: 2021-03-28

## 2020-10-14 NOTE — Progress Notes (Signed)
Patient notified and verbalized understanding.   Will send a new 25mg  tablet to [facility] at patient request.    Will mail lab order to home & she will do first week of December 2020.

## 2020-10-14 NOTE — Patient Instructions (Signed)
Medication Instructions:   Increase Losartan to 50mg  every morning & 25mg  every evening.  Continue all other medications.    Labwork:  BMET - order enclosed.   Please do around 10/28/2020 (2 weeks).  Office will contact with results via phone or letter.    Testing/Procedures: none  Follow-Up: Already scheduled with Dr. Domenic Polite for May.   Any Other Special Instructions Will Be Listed Below (If Applicable).  If you need a refill on your cardiac medications before your next appointment, please call your pharmacy.

## 2020-10-15 ENCOUNTER — Other Ambulatory Visit: Payer: Self-pay | Admitting: *Deleted

## 2020-10-15 MED ORDER — LOSARTAN POTASSIUM 25 MG PO TABS: 25.0000 mg | ORAL_TABLET | Freq: Every evening | ORAL | 6 refills | Status: DC

## 2020-11-11 DIAGNOSIS — I10 Essential (primary) hypertension: Secondary | ICD-10-CM | POA: Diagnosis not present

## 2020-11-12 LAB — BASIC METABOLIC PANEL
BUN: 9 mg/dL (ref 7–25)
CO2: 31 mmol/L (ref 20–32)
Calcium: 9.7 mg/dL (ref 8.6–10.4)
Chloride: 100 mmol/L (ref 98–110)
Creat: 0.73 mg/dL (ref 0.50–0.99)
Glucose, Bld: 91 mg/dL (ref 65–99)
Potassium: 4 mmol/L (ref 3.5–5.3)
Sodium: 138 mmol/L (ref 135–146)

## 2020-11-15 ENCOUNTER — Telehealth: Payer: Self-pay | Admitting: *Deleted

## 2020-11-15 NOTE — Telephone Encounter (Signed)
-----   Message from Satira Sark, MD sent at 11/12/2020  8:35 AM EST ----- Results reviewed.  Renal function and potassium remain normal after medication adjustments.

## 2020-11-15 NOTE — Telephone Encounter (Signed)
Patient informed. Copy sent to PCP °

## 2021-03-25 ENCOUNTER — Other Ambulatory Visit: Payer: Self-pay | Admitting: Cardiology

## 2021-03-28 ENCOUNTER — Ambulatory Visit: Payer: BC Managed Care – PPO | Admitting: Cardiology

## 2021-03-28 ENCOUNTER — Encounter: Payer: Self-pay | Admitting: Cardiology

## 2021-03-28 ENCOUNTER — Other Ambulatory Visit: Payer: Self-pay

## 2021-03-28 VITALS — BP 120/80 | HR 97 | Ht 62.0 in | Wt 201.0 lb

## 2021-03-28 DIAGNOSIS — E782 Mixed hyperlipidemia: Secondary | ICD-10-CM

## 2021-03-28 DIAGNOSIS — I25119 Atherosclerotic heart disease of native coronary artery with unspecified angina pectoris: Secondary | ICD-10-CM | POA: Diagnosis not present

## 2021-03-28 DIAGNOSIS — I10 Essential (primary) hypertension: Secondary | ICD-10-CM

## 2021-03-28 MED ORDER — LOSARTAN POTASSIUM 100 MG PO TABS: 100.0000 mg | ORAL_TABLET | Freq: Every day | ORAL | 3 refills | Status: DC

## 2021-03-28 MED ORDER — AMLODIPINE BESYLATE 2.5 MG PO TABS: 2.5000 mg | ORAL_TABLET | Freq: Every day | ORAL | 3 refills | Status: DC

## 2021-03-28 NOTE — Patient Instructions (Addendum)
Medication Instructions:  DECREASE Norvasc to 2.5 mg daily   TAKE Losartan 100 mg daily  *If you need a refill on your cardiac medications before your next appointment, please call your pharmacy*   Lab Work: None today If you have labs (blood work) drawn today and your tests are completely normal, you will receive your results only by: Marland Kitchen MyChart Message (if you have MyChart) OR . A paper copy in the mail If you have any lab test that is abnormal or we need to change your treatment, we will call you to review the results.   Testing/Procedures: None today   Follow-Up: At [facility], you and your health needs are our priority.  As part of our continuing mission to provide you with exceptional heart care, we have created designated Provider Care Teams.  These Care Teams include your primary Cardiologist (physician) and Advanced Practice Providers (APPs -  Physician Assistants and Nurse Practitioners) who all work together to provide you with the care you need, when you need it.  We recommend signing up for the patient portal called "MyChart".  Sign up information is provided on this After Visit Summary.  MyChart is used to connect with patients for Virtual Visits (Telemedicine).  Patients are able to view lab/test results, encounter notes, upcoming appointments, etc.  Non-urgent messages can be sent to your provider as well.   To learn more about what you can do with MyChart, go to [email or web address].    Your next appointment:   6 month(s)  The format for your next appointment:   In Person  Provider:   Rozann Lesches, MD   Other Instructions None

## 2021-03-28 NOTE — Progress Notes (Signed)
Cardiology Office Note  Date: 03/28/2021   ID: Kimberly, [name] 06/20/1956, MRN [id number]  PCP:  Lanelle Bal, PA-C  Cardiologist:  Rozann Lesches, MD Electrophysiologist:  None   Chief Complaint  Patient presents with  . Cardiac follow-up    History of Present Illness: [name] is a 65 y.o. female last seen in November 2021.  She is here today with her mother for a follow-up visit.  She does not report any angina symptoms at this time.  States that she is fatigued, also has difficulty sleeping well.  Ambien has not been helpful.  She states that her medications were recently adjusted by PCP including an increase in losartan to 100 mg daily which she had not yet started.  We went over her medications today and discussed a number of options.  For now would go ahead with losartan 100 mg daily, we will reduce Norvasc to 2.5 mg daily for now, otherwise continue Imdur.  Depending on blood pressure and recurrent angina, this might be able to be simplified further.   Past Medical History:  Diagnosis Date  . Acute ST elevation myocardial infarction (STEMI) of inferior wall ([place]) 2012  . Anxiety   . CAD (coronary artery disease)    DES to the proximal and distal RCA April 2012 in the setting of inferior STEMI; occluded mid to distal RCA with left-to-right collaterals 2018, managed medically  . Carotid stenosis    Less than 50% 8/11  . Essential hypertension   . GERD (gastroesophageal reflux disease) 03/02/2014  . H/O hiatal hernia   . Hyperlipidemia   . Nephrolithiasis   . Urticaria     Past Surgical History:  Procedure Laterality Date  . CARDIAC SURGERY     stent placement  . CHOLECYSTECTOMY    . COLONOSCOPY N/A 04/01/2014   Procedure: COLONOSCOPY;  Surgeon: Rogene Houston, MD;  [facility];  Service: Endoscopy;  Laterality: N/A;  100  . Cyst removed from spine    . ECTOPIC PREGNANCY SURGERY    . ESOPHAGOGASTRODUODENOSCOPY N/A 04/01/2014   Procedure:  ESOPHAGOGASTRODUODENOSCOPY (EGD);  Surgeon: Rogene Houston, MD;  [facility];  Service: Endoscopy;  Laterality: N/A;  100  . ESOPHAGOGASTRODUODENOSCOPY N/A 11/03/2016   Procedure: ESOPHAGOGASTRODUODENOSCOPY (EGD);  Surgeon: Rogene Houston, MD;  [facility];  Service: Endoscopy;  Laterality: N/A;  2:45  . Heart stent     2012  x 2 stents  . LEFT HEART CATH AND CORONARY ANGIOGRAPHY N/A 07/03/2017   Procedure: LEFT HEART CATH AND CORONARY ANGIOGRAPHY;  Surgeon: Burnell Blanks, MD;  Location: [facility];  Service: Cardiovascular;  Laterality: N/A;  . Left knee     Arthroscopic  . Left wrist    . TONSILLECTOMY AND ADENOIDECTOMY    . TOTAL KNEE ARTHROPLASTY Left 09/26/2013   Procedure: TOTAL KNEE ARTHROPLASTY- LEFT;  Surgeon: [name]., MD;  Location: [facility];  Service: Orthopedics;  Laterality: Left;  . TUBAL LIGATION      Current Outpatient Medications  Medication Sig Dispense Refill  . acetaminophen (TYLENOL) 650 MG CR tablet Take 650 mg by mouth every 8 (eight) hours as needed for pain. Takes 2 tabs twice a day.    Marland Kitchen amLODipine (NORVASC) 2.5 MG tablet Take 1 tablet (2.5 mg total) by mouth daily. 90 tablet 3  . aspirin 81 MG tablet Take 81 mg by mouth daily.    . busPIRone (BUSPAR) 5 MG  tablet Take 5 mg by mouth 2 (two) times daily as needed.    . DULoxetine (CYMBALTA) 60 MG capsule Take 60 mg by mouth daily.  3  . Evolocumab (REPATHA SURECLICK) 342 MG/ML SOAJ Inject 140 mg as directed every 14 (fourteen) days. 6 mL 3  . furosemide (LASIX) 40 MG tablet Take 1 tablet (40 mg total) by mouth as needed for edema (leg swelling).    . hydrocortisone (ANUSOL-HC) 2.5 % rectal cream Place 1 application rectally 2 (two) times daily. 30 g 1  . isosorbide mononitrate (IMDUR) 30 MG 24 hr tablet TAKE 1 TABLET(30 MG) BY MOUTH DAILY 90 tablet 1  . losartan (COZAAR) 100 MG tablet Take 1 tablet (100 mg total) by mouth daily. 90 tablet 3  . nitroGLYCERIN (NITROSTAT)  0.4 MG SL tablet Place 1 tablet (0.4 mg total) under the tongue every 5 (five) minutes as needed for chest pain. 25 tablet 3  . pantoprazole (PROTONIX) 40 MG tablet Take 1 tablet (40 mg total) by mouth 2 (two) times daily before a meal. 60 tablet 1  . zolpidem (AMBIEN) 10 MG tablet Take 10 mg by mouth at bedtime.   3   No current facility-administered medications for this visit.   Allergies:  Ancef [cefazolin], Ciprofloxacin hcl, Meat [alpha-gal], Metaxalone, and Sulfa antibiotics   ROS: No palpitations or syncope.  Physical Exam: VS:  BP 120/80 (BP Location: Left Arm, Patient Position: Sitting, Cuff Size: Normal)   Pulse 97   Ht 5\' 2"  (1.575 m)   Wt 201 lb (91.2 kg)   SpO2 97%   BMI 36.76 kg/m , BMI Body mass index is 36.76 kg/m.  Wt Readings from Last 3 Encounters:  03/28/21 201 lb (91.2 kg)  09/28/20 198 lb (89.8 kg)  06/14/20 197 lb (89.4 kg)    General: Patient appears comfortable at rest. HEENT: Conjunctiva and lids normal, wearing a mask. Neck: Supple, no elevated JVP or carotid bruits, no thyromegaly. Lungs: Clear to auscultation, nonlabored breathing at rest. Cardiac: Regular rate and rhythm, no S3 or significant systolic murmur, no pericardial rub. Extremities: No pitting edema.  ECG:  An ECG dated 09/28/2020 was personally reviewed today and demonstrated:  Sinus rhythm with old inferior infarct pattern.  Recent Labwork: 11/11/2020: BUN 9; Creat 0.73; Potassium 4.0; Sodium 138   Other Studies Reviewed Today:  Cardiac catheterization 07/03/2017:  Ost RCA to Prox RCA lesion, 40 %stenosed.  Mid RCA to Dist RCA lesion, 100 %stenosed.  Prox RCA to Mid RCA lesion, 80 %stenosed.  Ost LAD to Prox LAD lesion, 30 %stenosed.  The left ventricular systolic function is normal.  LV end diastolic pressure is normal.  The left ventricular ejection fraction is 55-65% by visual estimate.  There is no mitral valve regurgitation.  1. Severe single vessel CAD  2. The RCA  has diffuse mild disease prior to the proximal stent. The proximal to mid stented segment is patent with severe stenosis in the distal segment of the stent. The mid to distal vessel is occluded prior to the distal stent. The distal branches fill from left to right collaterals.  3. Mild non-obstructive disease in the proximal to mid LAD 4. Patent Circumflex with no obstructive disease.  5. Normal LV systolic function  Recommendations: Medical management of CAD for now. I have reviewed her case with Dr. Martinique and discussed options for potential CTO PCI. He feels that she would be a candidate for PCI of the CTO if she has angina on optimal medical  therapy.  Assessment and Plan:  1.  CAD status post DES to the proximal and distal RCA back in 2012 in the setting of inferior STEMI.  She has known occlusion of the mid to distal RCA with left-to-right collaterals as of 2018 as being managed medically at this point.  No active angina.  Continue aspirin, Norvasc, Imdur, losartan, Repatha and as needed nitroglycerin.  2.  Mixed hyperlipidemia with statin intolerance, doing well on Repatha.  3.  Essential hypertension, losartan recently increased to 100 mg daily.  Will reduce Norvasc to 2.5 mg daily and otherwise continue Imdur.  Depending on blood pressure and anginal control, may be able to simplify further.  Medication Adjustments/Labs and Tests Ordered: Current medicines are reviewed at length with the patient today.  Concerns regarding medicines are outlined above.   Tests Ordered: No orders of the defined types were placed in this encounter.   Medication Changes: Meds ordered this encounter  Medications  . amLODipine (NORVASC) 2.5 MG tablet    Sig: Take 1 tablet (2.5 mg total) by mouth daily.    Dispense:  90 tablet    Refill:  3    03/28/21 dose decreased to 2.5 mg qd  . losartan (COZAAR) 100 MG tablet    Sig: Take 1 tablet (100 mg total) by mouth daily.    Dispense:  90 tablet    Refill:   3    Disposition:  Follow up 6 months.  Signed, Satira Sark, MD, [facility] 03/28/2021 5:00 PM    [facility] [place] [place], [place], [place] [address] Phone: [phone]; Fax: [phone]

## 2021-06-30 ENCOUNTER — Ambulatory Visit (INDEPENDENT_AMBULATORY_CARE_PROVIDER_SITE_OTHER): Payer: BC Managed Care – PPO | Admitting: Gastroenterology

## 2021-07-31 ENCOUNTER — Other Ambulatory Visit: Payer: Self-pay | Admitting: Family Medicine

## 2021-08-10 ENCOUNTER — Other Ambulatory Visit: Payer: Self-pay | Admitting: Cardiology

## 2021-09-15 ENCOUNTER — Encounter (HOSPITAL_COMMUNITY): Payer: Self-pay | Admitting: Emergency Medicine

## 2021-09-15 ENCOUNTER — Other Ambulatory Visit: Payer: Self-pay

## 2021-09-15 ENCOUNTER — Inpatient Hospital Stay (HOSPITAL_COMMUNITY)
Admission: EM | Admit: 2021-09-15 | Discharge: 2021-09-18 | DRG: 392 | Disposition: A | Discharge status: Home / Self Care | Payer: BC Managed Care – PPO | Attending: Internal Medicine | Admitting: Internal Medicine

## 2021-09-15 ENCOUNTER — Emergency Department (HOSPITAL_COMMUNITY): Payer: BC Managed Care – PPO

## 2021-09-15 DIAGNOSIS — E66812 Obesity, class 2: Secondary | ICD-10-CM

## 2021-09-15 DIAGNOSIS — I10 Essential (primary) hypertension: Secondary | ICD-10-CM | POA: Diagnosis present

## 2021-09-15 DIAGNOSIS — Z955 Presence of coronary angioplasty implant and graft: Secondary | ICD-10-CM

## 2021-09-15 DIAGNOSIS — Z91018 Allergy to other foods: Secondary | ICD-10-CM

## 2021-09-15 DIAGNOSIS — F32A Depression, unspecified: Secondary | ICD-10-CM | POA: Diagnosis present

## 2021-09-15 DIAGNOSIS — Z79899 Other long term (current) drug therapy: Secondary | ICD-10-CM

## 2021-09-15 DIAGNOSIS — Z882 Allergy status to sulfonamides status: Secondary | ICD-10-CM

## 2021-09-15 DIAGNOSIS — Z7982 Long term (current) use of aspirin: Secondary | ICD-10-CM

## 2021-09-15 DIAGNOSIS — K219 Gastro-esophageal reflux disease without esophagitis: Secondary | ICD-10-CM | POA: Diagnosis present

## 2021-09-15 DIAGNOSIS — Z96652 Presence of left artificial knee joint: Secondary | ICD-10-CM | POA: Diagnosis present

## 2021-09-15 DIAGNOSIS — K5792 Diverticulitis of intestine, part unspecified, without perforation or abscess without bleeding: Secondary | ICD-10-CM | POA: Diagnosis not present

## 2021-09-15 DIAGNOSIS — Z20822 Contact with and (suspected) exposure to covid-19: Secondary | ICD-10-CM | POA: Diagnosis present

## 2021-09-15 DIAGNOSIS — Z9049 Acquired absence of other specified parts of digestive tract: Secondary | ICD-10-CM

## 2021-09-15 DIAGNOSIS — Z9851 Tubal ligation status: Secondary | ICD-10-CM

## 2021-09-15 DIAGNOSIS — K5900 Constipation, unspecified: Secondary | ICD-10-CM | POA: Diagnosis present

## 2021-09-15 DIAGNOSIS — M48061 Spinal stenosis, lumbar region without neurogenic claudication: Secondary | ICD-10-CM | POA: Diagnosis present

## 2021-09-15 DIAGNOSIS — F419 Anxiety disorder, unspecified: Secondary | ICD-10-CM | POA: Diagnosis present

## 2021-09-15 DIAGNOSIS — E782 Mixed hyperlipidemia: Secondary | ICD-10-CM | POA: Diagnosis present

## 2021-09-15 DIAGNOSIS — N281 Cyst of kidney, acquired: Secondary | ICD-10-CM | POA: Diagnosis present

## 2021-09-15 DIAGNOSIS — Z8249 Family history of ischemic heart disease and other diseases of the circulatory system: Secondary | ICD-10-CM

## 2021-09-15 DIAGNOSIS — Z87442 Personal history of urinary calculi: Secondary | ICD-10-CM

## 2021-09-15 DIAGNOSIS — Z885 Allergy status to narcotic agent status: Secondary | ICD-10-CM

## 2021-09-15 DIAGNOSIS — I2510 Atherosclerotic heart disease of native coronary artery without angina pectoris: Secondary | ICD-10-CM | POA: Diagnosis present

## 2021-09-15 DIAGNOSIS — I252 Old myocardial infarction: Secondary | ICD-10-CM

## 2021-09-15 DIAGNOSIS — Z881 Allergy status to other antibiotic agents status: Secondary | ICD-10-CM

## 2021-09-15 DIAGNOSIS — E669 Obesity, unspecified: Secondary | ICD-10-CM | POA: Diagnosis present

## 2021-09-15 DIAGNOSIS — Z6837 Body mass index (BMI) 37.0-37.9, adult: Secondary | ICD-10-CM

## 2021-09-15 DIAGNOSIS — Z87891 Personal history of nicotine dependence: Secondary | ICD-10-CM

## 2021-09-15 LAB — URINALYSIS, ROUTINE W REFLEX MICROSCOPIC
Bilirubin Urine: NEGATIVE
Glucose, UA: NEGATIVE mg/dL
Hgb urine dipstick: NEGATIVE
Ketones, ur: NEGATIVE mg/dL
Leukocytes,Ua: NEGATIVE
Nitrite: NEGATIVE
Protein, ur: NEGATIVE mg/dL
Specific Gravity, Urine: 1.023 (ref 1.005–1.030)
pH: 5 (ref 5.0–8.0)

## 2021-09-15 LAB — CBC WITH DIFFERENTIAL/PLATELET
Abs Immature Granulocytes: 0.01 10*3/uL (ref 0.00–0.07)
Basophils Absolute: 0.1 10*3/uL (ref 0.0–0.1)
Basophils Relative: 1 %
Eosinophils Absolute: 0.2 10*3/uL (ref 0.0–0.5)
Eosinophils Relative: 4 %
HCT: 41.3 % (ref 36.0–46.0)
Hemoglobin: 13.4 g/dL (ref 12.0–15.0)
Immature Granulocytes: 0 %
Lymphocytes Relative: 24 %
Lymphs Abs: 1.5 10*3/uL (ref 0.7–4.0)
MCH: 30.5 pg (ref 26.0–34.0)
MCHC: 32.4 g/dL (ref 30.0–36.0)
MCV: 94.1 fL (ref 80.0–100.0)
Monocytes Absolute: 0.7 10*3/uL (ref 0.1–1.0)
Monocytes Relative: 12 %
Neutro Abs: 3.5 10*3/uL (ref 1.7–7.7)
Neutrophils Relative %: 59 %
Platelets: 412 10*3/uL — ABNORMAL HIGH (ref 150–400)
RBC: 4.39 MIL/uL (ref 3.87–5.11)
RDW: 13.2 % (ref 11.5–15.5)
WBC: 6 10*3/uL (ref 4.0–10.5)
nRBC: 0 % (ref 0.0–0.2)

## 2021-09-15 LAB — BASIC METABOLIC PANEL
Anion gap: 7 (ref 5–15)
BUN: 11 mg/dL (ref 8–23)
CO2: 28 mmol/L (ref 22–32)
Calcium: 9.1 mg/dL (ref 8.9–10.3)
Chloride: 104 mmol/L (ref 98–111)
Creatinine, Ser: 0.65 mg/dL (ref 0.44–1.00)
GFR, Estimated: >60 mL/min (ref >60)
Glucose, Bld: 115 mg/dL — ABNORMAL HIGH (ref 70–99)
Potassium: 4 mmol/L (ref 3.5–5.1)
Sodium: 139 mmol/L (ref 135–145)

## 2021-09-15 LAB — RESP PANEL BY RT-PCR (FLU A&B, COVID) ARPGX2
Influenza A by PCR: NEGATIVE
Influenza B by PCR: NEGATIVE
SARS Coronavirus 2 by RT PCR: NEGATIVE

## 2021-09-15 LAB — LACTIC ACID, PLASMA: Lactic Acid, Venous: 1 mmol/L (ref 0.5–1.9)

## 2021-09-15 MED ORDER — MORPHINE SULFATE (PF) 4 MG/ML IV SOLN
4.0000 mg | Freq: Once | INTRAVENOUS | Status: AC
  Administered 2021-09-15: 4 mg via INTRAVENOUS
  Filled 2021-09-15: qty 1

## 2021-09-15 MED ORDER — PIPERACILLIN-TAZOBACTAM 3.375 G IVPB
3.3750 g | Freq: Three times a day (TID) | INTRAVENOUS | Status: DC
  Administered 2021-09-15 – 2021-09-18 (×8): 3.375 g via INTRAVENOUS
  Filled 2021-09-15 (×8): qty 50

## 2021-09-15 MED ORDER — PIPERACILLIN-TAZOBACTAM 3.375 G IVPB 30 MIN
3.3750 g | Freq: Once | INTRAVENOUS | Status: AC
  Administered 2021-09-15: 3.375 g via INTRAVENOUS
  Filled 2021-09-15: qty 50

## 2021-09-15 MED ORDER — DIPHENHYDRAMINE HCL 50 MG/ML IJ SOLN
12.5000 mg | Freq: Once | INTRAMUSCULAR | Status: DC
  Filled 2021-09-15: qty 1

## 2021-09-15 MED ORDER — LOSARTAN POTASSIUM 50 MG PO TABS
100.0000 mg | ORAL_TABLET | Freq: Every day | ORAL | Status: DC
  Administered 2021-09-16 – 2021-09-18 (×3): 100 mg via ORAL
  Filled 2021-09-15 (×3): qty 2

## 2021-09-15 MED ORDER — POLYETHYLENE GLYCOL 3350 17 G PO PACK
17.0000 g | PACK | Freq: Every day | ORAL | Status: DC | PRN
  Administered 2021-09-17: 17 g via ORAL
  Filled 2021-09-15 (×2): qty 1

## 2021-09-15 MED ORDER — HYDROMORPHONE HCL 1 MG/ML IJ SOLN
1.0000 mg | Freq: Once | INTRAMUSCULAR | Status: AC
Start: 2021-09-15 — End: 2021-09-15
  Administered 2021-09-15: 1 mg via INTRAVENOUS
  Filled 2021-09-15: qty 1

## 2021-09-15 MED ORDER — ONDANSETRON HCL 4 MG/2ML IJ SOLN
4.0000 mg | Freq: Four times a day (QID) | INTRAMUSCULAR | Status: DC | PRN
  Administered 2021-09-15: 4 mg via INTRAVENOUS
  Filled 2021-09-15: qty 2

## 2021-09-15 MED ORDER — ONDANSETRON HCL 4 MG PO TABS: 4.0000 mg | ORAL_TABLET | Freq: Four times a day (QID) | ORAL | Status: DC | PRN

## 2021-09-15 MED ORDER — ZOLPIDEM TARTRATE 5 MG PO TABS
10.0000 mg | ORAL_TABLET | Freq: Every day | ORAL | Status: DC
  Administered 2021-09-15 – 2021-09-17 (×3): 10 mg via ORAL
  Filled 2021-09-15 (×3): qty 2

## 2021-09-15 MED ORDER — DULOXETINE HCL 60 MG PO CPEP
60.0000 mg | ORAL_CAPSULE | Freq: Every day | ORAL | Status: DC
  Administered 2021-09-16 – 2021-09-18 (×3): 60 mg via ORAL
  Filled 2021-09-15 (×3): qty 1

## 2021-09-15 MED ORDER — AMLODIPINE BESYLATE 5 MG PO TABS
5.0000 mg | ORAL_TABLET | Freq: Every day | ORAL | Status: DC
  Administered 2021-09-16 – 2021-09-18 (×3): 5 mg via ORAL
  Filled 2021-09-15 (×3): qty 1

## 2021-09-15 MED ORDER — SODIUM CHLORIDE 0.9 % IV SOLN: INTRAVENOUS | Status: AC

## 2021-09-15 MED ORDER — ISOSORBIDE MONONITRATE ER 60 MG PO TB24
30.0000 mg | ORAL_TABLET | Freq: Every day | ORAL | Status: DC
  Administered 2021-09-16 – 2021-09-18 (×3): 30 mg via ORAL
  Filled 2021-09-15 (×3): qty 1

## 2021-09-15 MED ORDER — HYDROMORPHONE HCL 1 MG/ML IJ SOLN
0.5000 mg | INTRAMUSCULAR | Status: DC | PRN
Start: 2021-09-15 — End: 2021-09-18
  Administered 2021-09-15 – 2021-09-18 (×10): 0.5 mg via INTRAVENOUS
  Filled 2021-09-15 (×2): qty 0.5
  Filled 2021-09-15: qty 1
  Filled 2021-09-15 (×8): qty 0.5

## 2021-09-15 MED ORDER — ENOXAPARIN SODIUM 40 MG/0.4ML IJ SOSY
40.0000 mg | PREFILLED_SYRINGE | INTRAMUSCULAR | Status: DC
  Administered 2021-09-15 – 2021-09-17 (×3): 40 mg via SUBCUTANEOUS
  Filled 2021-09-15 (×4): qty 0.4

## 2021-09-15 MED ORDER — MORPHINE SULFATE (PF) 4 MG/ML IV SOLN
4.0000 mg | Freq: Once | INTRAVENOUS | Status: DC
Start: 2021-09-15 — End: 2021-09-15

## 2021-09-15 MED ORDER — ONDANSETRON HCL 4 MG/2ML IJ SOLN
4.0000 mg | Freq: Once | INTRAMUSCULAR | Status: AC
  Administered 2021-09-15: 4 mg via INTRAVENOUS
  Filled 2021-09-15: qty 2

## 2021-09-15 MED ORDER — ACETAMINOPHEN 325 MG PO TABS: 650.0000 mg | ORAL_TABLET | Freq: Four times a day (QID) | ORAL | Status: DC | PRN

## 2021-09-15 MED ORDER — ASPIRIN EC 81 MG PO TBEC
81.0000 mg | DELAYED_RELEASE_TABLET | Freq: Every day | ORAL | Status: DC
  Administered 2021-09-16 – 2021-09-18 (×3): 81 mg via ORAL
  Filled 2021-09-15 (×3): qty 1

## 2021-09-15 MED ORDER — PANTOPRAZOLE SODIUM 40 MG IV SOLR
40.0000 mg | INTRAVENOUS | Status: DC
  Administered 2021-09-15 – 2021-09-17 (×3): 40 mg via INTRAVENOUS
  Filled 2021-09-15 (×3): qty 40

## 2021-09-15 MED ORDER — ACETAMINOPHEN 650 MG RE SUPP: 650.0000 mg | Freq: Four times a day (QID) | RECTAL | Status: DC | PRN

## 2021-09-15 NOTE — ED Notes (Signed)
Pt denies itching to left arm at this time. No other symptoms of a reaction. Pt refused benadryl.

## 2021-09-15 NOTE — ED Provider Notes (Signed)
[facility] EMERGENCY DEPARTMENT Provider Note   CSN: [id number] Arrival date & time: 09/15/21  8676     History Chief Complaint  Patient presents with   Back Pain    [name] is a 65 y.o. female with a history including CAD with history of STEMI, hypertension, GERD, hyperlipidemia and history of kidney stones presenting with persistent left lower back and lower left quadrant abdominal pain along with urinary frequency and dysuria.  She was seen by her PCP 4 days ago and blood work obtained revealed an elevated WBC count.  She had been on Azo so her urinalysis at that point was difficult to interpret per patient's report.  She was placed on Macrobid at that time, currently on day 4 but reports she is having worsening pain symptoms.  She also has nausea but denies vomiting.  She describes chills but no documented fevers.  She denies chest pain or shortness of breath, denies diarrhea, constipation or bloody stools.  Her pain has become severe, constant at rest and worse with movement.  She has found no alleviators for symptoms.  The history is provided by the patient.      Past Medical History:  Diagnosis Date   Acute ST elevation myocardial infarction (STEMI) of inferior wall ([place]) 2012   Anxiety    CAD (coronary artery disease)    DES to the proximal and distal RCA April 2012 in the setting of inferior STEMI; occluded mid to distal RCA with left-to-right collaterals 2018, managed medically   Carotid stenosis    Less than 50% 8/11   Essential hypertension    GERD (gastroesophageal reflux disease) 03/02/2014   H/O hiatal hernia    Hyperlipidemia    Nephrolithiasis    Urticaria     Patient Active Problem List   Diagnosis Date Noted   Encounter for gynecological examination with Papanicolaou smear of cervix 06/16/2019   Screening for colorectal cancer 06/16/2019   PMB (postmenopausal bleeding) 06/16/2019   Chronic total occlusion of coronary artery 07/03/2017   Chest pain  07/01/2017   Abdominal pain, epigastric 10/10/2016   Gastroesophageal reflux disease without esophagitis 10/10/2016   GERD (gastroesophageal reflux disease) 03/02/2014   Family hx of colon cancer 03/02/2014   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   Arthritis of knee 10/31/2011   Tobacco abuse 03/28/2011   Obesity 03/28/2011   CAD (coronary artery disease)    HTN (hypertension)    Hyperlipidemia    Anxiety     Past Surgical History:  Procedure Laterality Date   CARDIAC SURGERY     stent placement   CHOLECYSTECTOMY     COLONOSCOPY N/A 04/01/2014   Procedure: COLONOSCOPY;  Surgeon: Rogene Houston, MD;  [facility];  Service: Endoscopy;  Laterality: N/A;  100   Cyst removed from spine     ECTOPIC PREGNANCY SURGERY     ESOPHAGOGASTRODUODENOSCOPY N/A 04/01/2014   Procedure: ESOPHAGOGASTRODUODENOSCOPY (EGD);  Surgeon: Rogene Houston, MD;  [facility];  Service: Endoscopy;  Laterality: N/A;  100   ESOPHAGOGASTRODUODENOSCOPY N/A 11/03/2016   Procedure: ESOPHAGOGASTRODUODENOSCOPY (EGD);  Surgeon: Rogene Houston, MD;  [facility];  Service: Endoscopy;  Laterality: N/A;  2:45   Heart stent     2012  x 2 stents   LEFT HEART CATH AND CORONARY ANGIOGRAPHY N/A 07/03/2017   Procedure: LEFT HEART CATH AND CORONARY ANGIOGRAPHY;  Surgeon: Burnell Blanks, MD;  Location: [facility];  Service: Cardiovascular;  Laterality: N/A;  Left knee     Arthroscopic   Left wrist     TONSILLECTOMY AND ADENOIDECTOMY     TOTAL KNEE ARTHROPLASTY Left 09/26/2013   Procedure: TOTAL KNEE ARTHROPLASTY- LEFT;  Surgeon: [name]., MD;  Location: [facility];  Service: Orthopedics;  Laterality: Left;   TUBAL LIGATION       OB History     Gravida  3   Para  2   Term  2   Preterm      AB  1   Living  2      SAB      IAB      Ectopic  1   Multiple      Live Births              Family History  Problem Relation Age of Onset   Colon cancer Father    Heart  disease Mother    Heart disease Other    Arthritis Other    Cancer Other    Allergic rhinitis Neg Hx    Angioedema Neg Hx    Asthma Neg Hx    Atopy Neg Hx    Eczema Neg Hx    Immunodeficiency Neg Hx    Urticaria Neg Hx     Social History   Tobacco Use   Smoking status: Former    Packs/day: 1.00    Years: 10.00    Pack years: 10.00    Types: Cigarettes    Start date: 06/27/1979    Quit date: 11/27/2010    Years since quitting: 10.8   Smokeless tobacco: Never  Vaping Use   Vaping Use: Never used  Substance Use Topics   Alcohol use: Yes    Alcohol/week: 0.0 standard drinks    Comment: social   Drug use: No    Home Medications Prior to Admission medications   Medication Sig Start Date End Date Taking? Authorizing Provider  acetaminophen (TYLENOL) 650 MG CR tablet Take 650 mg by mouth every 8 (eight) hours as needed for pain. Takes 2 tabs twice a day.   Yes [provider]  amLODipine (NORVASC) 5 MG tablet Take 5 mg by mouth daily. 07/14/21  Yes [provider]  aspirin 81 MG tablet Take 81 mg by mouth daily.   Yes [provider]  busPIRone (BUSPAR) 5 MG tablet Take 5 mg by mouth 2 (two) times daily as needed. 09/22/19  Yes [provider]  DULoxetine (CYMBALTA) 60 MG capsule Take 60 mg by mouth daily. 04/06/15  Yes [provider]  isosorbide mononitrate (IMDUR) 30 MG 24 hr tablet TAKE 1 TABLET(30 MG) BY MOUTH DAILY 08/10/21  Yes Satira Sark, MD  LORazepam (ATIVAN) 0.5 MG tablet Take 0.5 mg by mouth daily as needed. 07/14/21  Yes [provider]  losartan (COZAAR) 100 MG tablet Take 1 tablet (100 mg total) by mouth daily. 03/28/21 09/15/21 Yes Satira Sark, MD  nitrofurantoin, macrocrystal-monohydrate, (MACROBID) 100 MG capsule Take 100 mg by mouth 2 (two) times daily. 09/12/21  Yes [provider]  nitroGLYCERIN (NITROSTAT) 0.4 MG SL tablet Place 1 tablet (0.4 mg total) under the tongue every 5 (five)  minutes as needed for chest pain. 09/11/17  Yes Herminio Commons, MD  pantoprazole (PROTONIX) 40 MG tablet Take 1 tablet (40 mg total) by mouth 2 (two) times daily before a meal. 12/31/19  Yes Ezzard Standing, PA-C  REPATHA SURECLICK 938 MG/ML SOAJ ADMINISTER 1 ML UNDER THE  SKIN EVERY 14 DAYS AS DIRECTED 08/02/21  Yes Satira Sark, MD  zolpidem (AMBIEN) 10 MG tablet Take 10 mg by mouth at bedtime.  03/23/16  Yes [provider]  amLODipine (NORVASC) 2.5 MG tablet Take 1 tablet (2.5 mg total) by mouth daily. 03/28/21 06/26/21  Satira Sark, MD  furosemide (LASIX) 40 MG tablet Take 1 tablet (40 mg total) by mouth as needed for edema (leg swelling). Patient not taking: Reported on 09/15/2021 09/11/17   Herminio Commons, MD  hydrocortisone (ANUSOL-HC) 2.5 % rectal cream Place 1 application rectally 2 (two) times daily. Patient not taking: Reported on 09/15/2021 12/31/17   Butch Penny, NP    Allergies    Ancef [cefazolin], Ciprofloxacin hcl, Meat [alpha-gal], Metaxalone, and Sulfa antibiotics  Review of Systems   Review of Systems  Constitutional:  Positive for chills. Negative for fever.  HENT:  Negative for congestion and sore throat.   Eyes: Negative.   Respiratory:  Negative for chest tightness and shortness of breath.   Cardiovascular:  Negative for chest pain.  Gastrointestinal:  Positive for abdominal pain and nausea. Negative for vomiting.  Genitourinary:  Positive for dysuria and frequency. Negative for hematuria.  Musculoskeletal:  Positive for back pain. Negative for arthralgias, joint swelling and neck pain.  Skin: Negative.  Negative for rash and wound.  Neurological:  Negative for dizziness, weakness, light-headedness, numbness and headaches.  Psychiatric/Behavioral: Negative.    All other systems reviewed and are negative.  Physical Exam Updated Vital Signs BP 115/75   Pulse 84   Temp 98.6 F (37 C) (Oral)   Resp 16   Ht 5\' 2"  (1.575 m)   Wt  91.6 kg   SpO2 92%   BMI 36.95 kg/m   Physical Exam Vitals and nursing note reviewed.  Constitutional:      Appearance: She is well-developed.  HENT:     Head: Normocephalic and atraumatic.  Eyes:     Conjunctiva/sclera: Conjunctivae normal.  Cardiovascular:     Rate and Rhythm: Normal rate and regular rhythm.     Heart sounds: Normal heart sounds.  Pulmonary:     Effort: Pulmonary effort is normal.     Breath sounds: Normal breath sounds. No wheezing.  Abdominal:     General: Bowel sounds are normal.     Palpations: Abdomen is soft.     Tenderness: There is abdominal tenderness in the left lower quadrant. There is guarding. There is no left CVA tenderness.     Comments: No reproducible pain to palpation in back.   Musculoskeletal:        General: Normal range of motion.     Cervical back: Normal range of motion.  Skin:    General: Skin is warm and dry.  Neurological:     Mental Status: She is alert.    ED Results / Procedures / Treatments   Labs (all labs ordered are listed, but only abnormal results are displayed) Labs Reviewed  CBC WITH DIFFERENTIAL/PLATELET - Abnormal; Notable for the following components:      Result Value   Platelets 412 (*)    All other components within normal limits  BASIC METABOLIC PANEL - Abnormal; Notable for the following components:   Glucose, Bld 115 (*)    All other components within normal limits  URINALYSIS, ROUTINE W REFLEX MICROSCOPIC - Abnormal; Notable for the following components:   APPearance HAZY (*)    All other components within normal limits  LACTIC ACID, PLASMA  EKG None  Radiology CT Renal Stone Study  Result Date: 09/15/2021 CLINICAL DATA:  65 year old female with severe right flank and back pain. Recently placed on antibiotics for possible UTI. EXAM: CT ABDOMEN AND PELVIS WITHOUT CONTRAST TECHNIQUE: Multidetector CT imaging of the abdomen and pelvis was performed following the standard protocol without IV  contrast. COMPARISON:  CT Abdomen and Pelvis 04/10/2016. FINDINGS: Lower chest: Tortuous descending thoracic aorta. Cardiac size at the upper limits of normal. Mild respiratory motion. Negative lung bases. Hepatobiliary: Chronically absent gallbladder. Negative noncontrast liver. Pancreas: Fatty atrophy. Spleen: Negative. Adrenals/Urinary Tract: Normal adrenal glands. Chronic right renal cysts have simple fluid density and are not significantly changed from 2017. No nephrolithiasis or hydronephrosis. Right ureter is decompressed and normal to the bladder. Incidental pelvic phleboliths. Unremarkable bladder. Stomach/Bowel: Negative rectum. Sigmoid diverticulosis and active inflammation (series 2, image 66 and coronal image 87) with inflammatory stranding in the deep pelvis. A sigmoid segment of about 6 cm is affected. Upstream sigmoid colon is decompressed with extensive diverticulosis continuing through the descending colon. Retained stool in redundant transverse colon. Similar retained stool in the right colon. Normal appendix on coronal image 68. No other large bowel inflammation. Negative terminal ileum. No dilated small bowel. Possible small gastric hiatal hernia but otherwise decompressed and negative stomach. Negative duodenum. No free air, free fluid. Vascular/Lymphatic: Aortoiliac calcified atherosclerosis. Normal caliber abdominal aorta. No lymphadenopathy. Reproductive: Negative noncontrast appearance. Other: No pelvic free fluid. Musculoskeletal: Generally mild for age lumbar spine degeneration, stable since 2017. No acute osseous abnormality identified. IMPRESSION: 1. Acute Diverticulitis of the mid sigmoid colon in the pelvis. No associated abscess or other complicating features. Widespread underlying descending and sigmoid colon diverticula. 2. No other acute or inflammatory process identified in the non-contrast abdomen or pelvis. 3. Aortic Atherosclerosis (ICD10-I70.0). Electronically Signed   By: Genevie Ann M.D.   On: 09/15/2021 11:24    Procedures Procedures   Medications Ordered in ED Medications  diphenhydrAMINE (BENADRYL) injection 12.5 mg (12.5 mg Intravenous Patient Refused/Not Given 09/15/21 1120)  HYDROmorphone (DILAUDID) injection 1 mg (has no administration in time range)  ondansetron (ZOFRAN) injection 4 mg (4 mg Intravenous Given 09/15/21 1029)  morphine 4 MG/ML injection 4 mg (4 mg Intravenous Given 09/15/21 1030)  piperacillin-tazobactam (ZOSYN) IVPB 3.375 g (0 g Intravenous Stopped 09/15/21 1325)  morphine 4 MG/ML injection 4 mg (4 mg Intravenous Given 09/15/21 1257)    ED Course  I have reviewed the triage vital signs and the nursing notes.  Pertinent labs & imaging results that were available during my care of the patient were reviewed by me and considered in my medical decision making (see chart for details).  Clinical Course as of 09/15/21 1459  Thu Sep 15, 2021  1056 Patient was given IV morphine and Zofran.  She started to have complaints of itching in her IV arm.  No shortness of breath.  Benadryl 12.5 mg IV was ordered. [JI]    Clinical Course User Index [JI] Landis Martins   MDM Rules/Calculators/A&P                           Patient presenting with acute diverticulitis of the sigmoid region without abscess or perforation.  She was given multiple doses of pain medication with very transient improvement in her pain symptoms.  Her nausea was improved with Zofran and she was able to tolerate p.o. fluids.  Labs were reviewed and is reassuring with a normal  WBC count.  Given multiple doses of morphine controlled her pain for less than an hour with each dose, dose of Dilaudid ordered.  I do not believe she will do well at home given her degree of pain and would benefit from at least an overnight observation for better pain control.  She was given an IV dose of Zosyn as she has multiple antibiotic allergies including Cipro, sulfa and cephalosporins.  Call to  hospitalists for admission. Discussed with Dr. Denton Brick who accepts pt for admission.  Final Clinical Impression(s) / ED Diagnoses Final diagnoses:  Acute diverticulitis    Rx / DC Orders ED Discharge Orders     None        Landis Martins 09/15/21 1523    Milton Ferguson, MD 09/17/21 1021

## 2021-09-15 NOTE — ED Notes (Signed)
Spite provided to the patient.

## 2021-09-15 NOTE — ED Triage Notes (Signed)
Patient arrives c/o severe lower right sided back pain. States she saw her PCP Monday concerned for a UTI. Reports elevated WBC and placed on antibiotics. Reports chills and nausea. Hx of kidney stones.

## 2021-09-15 NOTE — Progress Notes (Signed)
Pharmacy Antibiotic Note  [name] is a 65 y.o. female  presented  on 09/15/2021 with persistent left lower back and lower left quadrant abdominal pain along with urinary frequency and dysuria.  CT renal stone study shows acute diverticulitis.   UA not suggestive of UTI.  Pharmacy has been consulted for Zosyn dosing for intra-abdominal infection.  Received 1 dose of Zosyn  3.375 g in ED ~13:00 09/15/21.  Plan:  Zosyn 3.375 g IV q8hr, infuse each dose over 4 hrs Monitor clinical status, renal function and culture results daily.   Height: 5\' 2"  (157.5 cm) Weight: 91.6 kg (202 lb) IBW/kg (Calculated) : 50.1  Temp (24hrs), Avg:98.6 F (37 C), Min:98.6 F (37 C), Max:98.6 F (37 C)  Recent Labs  Lab 09/15/21 1039  WBC 6.0  CREATININE 0.65  LATICACIDVEN 1.0    Estimated Creatinine Clearance: 74.8 mL/min (by C-G formula based on SCr of 0.65 mg/dL).    Allergies  Allergen Reactions   Ancef [Cefazolin] Itching    Itching around mouth - sx resolved quickly after administration of benadryl; with no further sx   Ciprofloxacin Hcl     unknown   Meat [Alpha-Gal] Diarrhea   Metaxalone     unknown   Morphine And Related Itching    Mild itching to head  and extremities, self resolved without intervention.    Sulfa Antibiotics Itching, Rash and Other (See Comments)    "severe muscle cramps, tongue cracked, dry mouth "    Antimicrobials this admission: Zosyn 10/20>>  Dose adjustments this admission: N/a   Microbiology results: 10/20 Covid-19 PCR: negative; flu pcr: negative   Thank you for allowing pharmacy to be a part of this patient's care.  Nicole Cella, RPh Clinical Pharmacist 09/15/2021 7:29 PM

## 2021-09-15 NOTE — H&P (Signed)
History and Physical    [name] WGN:[id number] DOB: 1956-07-29 DOA: 09/15/2021  PCP: Lanelle Bal, PA-C   Patient coming from: Home  I have personally briefly reviewed patient's old medical records in [place]  Chief Complaint: lower back pain  HPI: [name] is a 65 y.o. female with medical history significant for hypertension, coronary artery disease, nephrolithiasis. Presented to the ED with complaints of pain in the right low back, also with lower abdominal pain.  Symptoms started on the 14th, 6 days ago.  Patient thought she was having urinary tract infection, so she started taking Azo.  She saw her primary care provider 2 days after symptom onset, she reports her white count was elevated, and that her urinalysis was difficult to interpret, she was placed on Macrobid.  He has completed 4 days.  Due to the worsening symptoms she presented to the ED.  She reports she vomited once.No loose stools.  ED Course: Temperature 98.6, heart rate 80s.  Respiratory rate 12-20.  Blood pressure 94/55.  O2 sats 91 to 98% on room air.  WBC 6.  Lactic acid normal.  UA not suggestive of UTI.  CT renal stone study shows acute diverticulitis. -IV Zosyn started. Patient received IV morphine and subsequently complained of mild itching to her head and extremities.  No rash no difficulty breathing no hives.  She declined Benadryl as she felt her symptoms are very mild.  She did okay with Dilaudid.  Review of Systems: As per HPI all other systems reviewed and negative.  Past Medical History:  Diagnosis Date   Acute ST elevation myocardial infarction (STEMI) of inferior wall ([place]) 2012   Anxiety    CAD (coronary artery disease)    DES to the proximal and distal RCA April 2012 in the setting of inferior STEMI; occluded mid to distal RCA with left-to-right collaterals 2018, managed medically   Carotid stenosis    Less than 50% 8/11   Essential hypertension    GERD (gastroesophageal  reflux disease) 03/02/2014   H/O hiatal hernia    Hyperlipidemia    Nephrolithiasis    Urticaria     Past Surgical History:  Procedure Laterality Date   CARDIAC SURGERY     stent placement   CHOLECYSTECTOMY     COLONOSCOPY N/A 04/01/2014   Procedure: COLONOSCOPY;  Surgeon: Rogene Houston, MD;  [facility];  Service: Endoscopy;  Laterality: N/A;  100   Cyst removed from spine     ECTOPIC PREGNANCY SURGERY     ESOPHAGOGASTRODUODENOSCOPY N/A 04/01/2014   Procedure: ESOPHAGOGASTRODUODENOSCOPY (EGD);  Surgeon: Rogene Houston, MD;  [facility]: [facility];  Service: Endoscopy;  Laterality: N/A;  100   ESOPHAGOGASTRODUODENOSCOPY N/A 11/03/2016   Procedure: ESOPHAGOGASTRODUODENOSCOPY (EGD);  Surgeon: Rogene Houston, MD;  [facility]: [facility];  Service: Endoscopy;  Laterality: N/A;  2:45   Heart stent     2012  x 2 stents   LEFT HEART CATH AND CORONARY ANGIOGRAPHY N/A 07/03/2017   Procedure: LEFT HEART CATH AND CORONARY ANGIOGRAPHY;  Surgeon: Burnell Blanks, MD;  Location: [facility];  Service: Cardiovascular;  Laterality: N/A;   Left knee     Arthroscopic   Left wrist     TONSILLECTOMY AND ADENOIDECTOMY     TOTAL KNEE ARTHROPLASTY Left 09/26/2013   Procedure: TOTAL KNEE ARTHROPLASTY- LEFT;  Surgeon: [name]., MD;  Location: [place];  Service: Orthopedics;  Laterality: Left;   TUBAL LIGATION  reports that she quit smoking about 10 years ago. Her smoking use included cigarettes. She started smoking about 42 years ago. She has a 10.00 pack-year smoking history. She has never used smokeless tobacco. She reports current alcohol use. She reports that she does not use drugs.  Allergies  Allergen Reactions   Ancef [Cefazolin] Itching    Itching around mouth - sx resolved quickly after administration of benadryl; with no further sx   Ciprofloxacin Hcl     unknown   Meat [Alpha-Gal] Diarrhea   Metaxalone     unknown   Sulfa Antibiotics Itching, Rash and  Other (See Comments)    "severe muscle cramps, tongue cracked, dry mouth "    Family History  Problem Relation Age of Onset   Colon cancer Father    Heart disease Mother    Heart disease Other    Arthritis Other    Cancer Other    Allergic rhinitis Neg Hx    Angioedema Neg Hx    Asthma Neg Hx    Atopy Neg Hx    Eczema Neg Hx    Immunodeficiency Neg Hx    Urticaria Neg Hx    Prior to Admission medications   Medication Sig Start Date End Date Taking? Authorizing Provider  acetaminophen (TYLENOL) 650 MG CR tablet Take 650 mg by mouth every 8 (eight) hours as needed for pain. Takes 2 tabs twice a day.   Yes [provider]  amLODipine (NORVASC) 5 MG tablet Take 5 mg by mouth daily. 07/14/21  Yes [provider]  aspirin 81 MG tablet Take 81 mg by mouth daily.   Yes [provider]  busPIRone (BUSPAR) 5 MG tablet Take 5 mg by mouth 2 (two) times daily as needed. 09/22/19  Yes [provider]  DULoxetine (CYMBALTA) 60 MG capsule Take 60 mg by mouth daily. 04/06/15  Yes [provider]  isosorbide mononitrate (IMDUR) 30 MG 24 hr tablet TAKE 1 TABLET(30 MG) BY MOUTH DAILY 08/10/21  Yes Satira Sark, MD  LORazepam (ATIVAN) 0.5 MG tablet Take 0.5 mg by mouth daily as needed. 07/14/21  Yes [provider]  losartan (COZAAR) 100 MG tablet Take 1 tablet (100 mg total) by mouth daily. 03/28/21 09/15/21 Yes Satira Sark, MD  nitrofurantoin, macrocrystal-monohydrate, (MACROBID) 100 MG capsule Take 100 mg by mouth 2 (two) times daily. 09/12/21  Yes [provider]  nitroGLYCERIN (NITROSTAT) 0.4 MG SL tablet Place 1 tablet (0.4 mg total) under the tongue every 5 (five) minutes as needed for chest pain. 09/11/17  Yes Herminio Commons, MD  pantoprazole (PROTONIX) 40 MG tablet Take 1 tablet (40 mg total) by mouth 2 (two) times daily before a meal. 12/31/19  Yes Ezzard Standing, PA-C  REPATHA SURECLICK 193 MG/ML SOAJ ADMINISTER 1 ML  UNDER THE SKIN EVERY 14 DAYS AS DIRECTED 08/02/21  Yes Satira Sark, MD  zolpidem (AMBIEN) 10 MG tablet Take 10 mg by mouth at bedtime.  03/23/16  Yes [provider]  amLODipine (NORVASC) 2.5 MG tablet Take 1 tablet (2.5 mg total) by mouth daily. 03/28/21 06/26/21  Satira Sark, MD  furosemide (LASIX) 40 MG tablet Take 1 tablet (40 mg total) by mouth as needed for edema (leg swelling). Patient not taking: Reported on 09/15/2021 09/11/17   Herminio Commons, MD  hydrocortisone (ANUSOL-HC) 2.5 % rectal cream Place 1 application rectally 2 (two) times daily. Patient not taking: Reported on 09/15/2021 12/31/17   Vaughan Basta, Rona Ravens,  NP    Physical Exam: Vitals:   09/15/21 1227 09/15/21 1330 09/15/21 1400 09/15/21 1430  BP: 110/80 116/79 115/75 123/86  Pulse: 89 90 84 81  Resp: 18 18 16 16   Temp:      TempSrc:      SpO2: 93% 91% 92% 92%  Weight:      Height:        Constitutional: NAD, calm, comfortable Vitals:   09/15/21 1227 09/15/21 1330 09/15/21 1400 09/15/21 1430  BP: 110/80 116/79 115/75 123/86  Pulse: 89 90 84 81  Resp: 18 18 16 16   Temp:      TempSrc:      SpO2: 93% 91% 92% 92%  Weight:      Height:       Eyes: PERRL, lids and conjunctivae normal ENMT: Mucous membranes are moist.  Neck: normal, supple, no masses, no thyromegaly Respiratory: clear to auscultation bilaterally, no wheezing, no crackles. Normal respiratory effort. No accessory muscle use.  Cardiovascular: Regular rate and rhythm, no murmurs / rubs / gallops. No extremity edema. 2+ pedal pulses. Abdomen: no tenderness, no masses palpated. No hepatosplenomegaly. Bowel sounds positive.  Musculoskeletal: no clubbing / cyanosis. No joint deformity upper and lower extremities. Good ROM, no contractures. Normal muscle tone.  Skin: no rashes, lesions, ulcers. No induration Neurologic: No acute abnormality, moving extremities spontaneously. Psychiatric: Normal judgment and insight. Alert and oriented x  3. Normal mood.   Labs on Admission: I have personally reviewed following labs and imaging studies  CBC: Recent Labs  Lab 09/15/21 1039  WBC 6.0  NEUTROABS 3.5  HGB 13.4  HCT 41.3  MCV 94.1  PLT 364*   Basic Metabolic Panel: Recent Labs  Lab 09/15/21 1039  NA 139  K 4.0  CL 104  CO2 28  GLUCOSE 115*  BUN 11  CREATININE 0.65  CALCIUM 9.1   Urine analysis:    Component Value Date/Time   COLORURINE YELLOW 09/15/2021 1232   APPEARANCEUR HAZY (A) 09/15/2021 1232   LABSPEC 1.023 09/15/2021 1232   PHURINE 5.0 09/15/2021 [place] 09/15/2021 1232   HGBUR NEGATIVE 09/15/2021 1232   [place] 09/15/2021 1232   KETONESUR NEGATIVE 09/15/2021 1232   PROTEINUR NEGATIVE 09/15/2021 1232   UROBILINOGEN 0.2 09/18/2013 1302   NITRITE NEGATIVE 09/15/2021 1232   LEUKOCYTESUR NEGATIVE 09/15/2021 1232    Radiological Exams on Admission: CT Renal Stone Study  Result Date: 09/15/2021 CLINICAL DATA:  65 year old female with severe right flank and back pain. Recently placed on antibiotics for possible UTI. EXAM: CT ABDOMEN AND PELVIS WITHOUT CONTRAST TECHNIQUE: Multidetector CT imaging of the abdomen and pelvis was performed following the standard protocol without IV contrast. COMPARISON:  CT Abdomen and Pelvis 04/10/2016. FINDINGS: Lower chest: Tortuous descending thoracic aorta. Cardiac size at the upper limits of normal. Mild respiratory motion. Negative lung bases. Hepatobiliary: Chronically absent gallbladder. Negative noncontrast liver. Pancreas: Fatty atrophy. Spleen: Negative. Adrenals/Urinary Tract: Normal adrenal glands. Chronic right renal cysts have simple fluid density and are not significantly changed from 2017. No nephrolithiasis or hydronephrosis. Right ureter is decompressed and normal to the bladder. Incidental pelvic phleboliths. Unremarkable bladder. Stomach/Bowel: Negative rectum. Sigmoid diverticulosis and active inflammation (series 2, image 66  and coronal image 87) with inflammatory stranding in the deep pelvis. A sigmoid segment of about 6 cm is affected. Upstream sigmoid colon is decompressed with extensive diverticulosis continuing through the descending colon. Retained stool in redundant transverse colon. Similar retained stool in the right colon. Normal  appendix on coronal image 68. No other large bowel inflammation. Negative terminal ileum. No dilated small bowel. Possible small gastric hiatal hernia but otherwise decompressed and negative stomach. Negative duodenum. No free air, free fluid. Vascular/Lymphatic: Aortoiliac calcified atherosclerosis. Normal caliber abdominal aorta. No lymphadenopathy. Reproductive: Negative noncontrast appearance. Other: No pelvic free fluid. Musculoskeletal: Generally mild for age lumbar spine degeneration, stable since 2017. No acute osseous abnormality identified. IMPRESSION: 1. Acute Diverticulitis of the mid sigmoid colon in the pelvis. No associated abscess or other complicating features. Widespread underlying descending and sigmoid colon diverticula. 2. No other acute or inflammatory process identified in the non-contrast abdomen or pelvis. 3. Aortic Atherosclerosis (ICD10-I70.0). Electronically Signed   By: Genevie Ann M.D.   On: 09/15/2021 11:24    EKG: None.   Assessment/Plan Principal Problem:   Acute diverticulitis Active Problems:   CAD (coronary artery disease)   HTN (hypertension)   Anxiety   Acute diverticulitis-no abdominal pain, flank pain, vomiting X 1.  Afebrile, WBC 6.  Lactic acid 1.  Rules out for sepsis.  CT renal stone study with mid sigmoid colon acute diverticulitis, no abscess or other complicating features. -Continue IV Zosyn started in ED. -Bowel rest with clear liquid diet -Itching to IV morphine, will use IV Dilaudid 0.5 mg as needed -Zofran as needed - N/s 100cc/hr x 15hrs - IV Protonix 40mg  daily  Coronary artery disease-stable.  No chest pain. -Resume Imdur in  a.m. -Resume aspirin  Hypertension-systolic 94 to 818.  -Hold home as needed Lasix, -Resume Norvasc, losartan Imdur in a.m.  Anxiety -Resume Cymbalta, buspirone  DVT prophylaxis: Lovenox Code Status: Full code Family Communication: None at bedside Disposition Plan: ~ 2 days Consults called: None Admission status: Inpt, tele I certify that at the point of admission it is my clinical judgment that the patient will require inpatient hospital care spanning beyond 2 midnights from the point of admission due to high intensity of service, high risk for further deterioration and high frequency of surveillance required.   Bethena Roys MD [facility]  09/15/2021, 7:12 PM

## 2021-09-16 ENCOUNTER — Encounter (HOSPITAL_COMMUNITY): Payer: Self-pay | Admitting: Internal Medicine

## 2021-09-16 DIAGNOSIS — Z96652 Presence of left artificial knee joint: Secondary | ICD-10-CM | POA: Diagnosis present

## 2021-09-16 DIAGNOSIS — I10 Essential (primary) hypertension: Secondary | ICD-10-CM | POA: Diagnosis present

## 2021-09-16 DIAGNOSIS — F419 Anxiety disorder, unspecified: Secondary | ICD-10-CM | POA: Diagnosis present

## 2021-09-16 DIAGNOSIS — I252 Old myocardial infarction: Secondary | ICD-10-CM | POA: Diagnosis not present

## 2021-09-16 DIAGNOSIS — E782 Mixed hyperlipidemia: Secondary | ICD-10-CM | POA: Diagnosis present

## 2021-09-16 DIAGNOSIS — Z20822 Contact with and (suspected) exposure to covid-19: Secondary | ICD-10-CM | POA: Diagnosis present

## 2021-09-16 DIAGNOSIS — N281 Cyst of kidney, acquired: Secondary | ICD-10-CM | POA: Diagnosis present

## 2021-09-16 DIAGNOSIS — Z6837 Body mass index (BMI) 37.0-37.9, adult: Secondary | ICD-10-CM | POA: Diagnosis not present

## 2021-09-16 DIAGNOSIS — M48061 Spinal stenosis, lumbar region without neurogenic claudication: Secondary | ICD-10-CM | POA: Diagnosis present

## 2021-09-16 DIAGNOSIS — E669 Obesity, unspecified: Secondary | ICD-10-CM | POA: Diagnosis present

## 2021-09-16 DIAGNOSIS — Z9851 Tubal ligation status: Secondary | ICD-10-CM | POA: Diagnosis not present

## 2021-09-16 DIAGNOSIS — Z79899 Other long term (current) drug therapy: Secondary | ICD-10-CM | POA: Diagnosis not present

## 2021-09-16 DIAGNOSIS — Z91018 Allergy to other foods: Secondary | ICD-10-CM | POA: Diagnosis not present

## 2021-09-16 DIAGNOSIS — Z7982 Long term (current) use of aspirin: Secondary | ICD-10-CM | POA: Diagnosis not present

## 2021-09-16 DIAGNOSIS — K5900 Constipation, unspecified: Secondary | ICD-10-CM | POA: Diagnosis present

## 2021-09-16 DIAGNOSIS — F32A Depression, unspecified: Secondary | ICD-10-CM | POA: Diagnosis present

## 2021-09-16 DIAGNOSIS — K5792 Diverticulitis of intestine, part unspecified, without perforation or abscess without bleeding: Secondary | ICD-10-CM | POA: Diagnosis present

## 2021-09-16 DIAGNOSIS — Z8249 Family history of ischemic heart disease and other diseases of the circulatory system: Secondary | ICD-10-CM | POA: Diagnosis not present

## 2021-09-16 DIAGNOSIS — Z87891 Personal history of nicotine dependence: Secondary | ICD-10-CM | POA: Diagnosis not present

## 2021-09-16 DIAGNOSIS — Z9049 Acquired absence of other specified parts of digestive tract: Secondary | ICD-10-CM | POA: Diagnosis not present

## 2021-09-16 DIAGNOSIS — Z87442 Personal history of urinary calculi: Secondary | ICD-10-CM | POA: Diagnosis not present

## 2021-09-16 DIAGNOSIS — Z881 Allergy status to other antibiotic agents status: Secondary | ICD-10-CM | POA: Diagnosis not present

## 2021-09-16 DIAGNOSIS — K219 Gastro-esophageal reflux disease without esophagitis: Secondary | ICD-10-CM | POA: Diagnosis present

## 2021-09-16 DIAGNOSIS — I2510 Atherosclerotic heart disease of native coronary artery without angina pectoris: Secondary | ICD-10-CM | POA: Diagnosis present

## 2021-09-16 LAB — CBC
HCT: 39.2 % (ref 36.0–46.0)
Hemoglobin: 12.4 g/dL (ref 12.0–15.0)
MCH: 30.3 pg (ref 26.0–34.0)
MCHC: 31.6 g/dL (ref 30.0–36.0)
MCV: 95.8 fL (ref 80.0–100.0)
Platelets: 386 10*3/uL (ref 150–400)
RBC: 4.09 MIL/uL (ref 3.87–5.11)
RDW: 13.3 % (ref 11.5–15.5)
WBC: 7.2 10*3/uL (ref 4.0–10.5)
nRBC: 0 % (ref 0.0–0.2)

## 2021-09-16 LAB — BASIC METABOLIC PANEL
Anion gap: 7 (ref 5–15)
BUN: 12 mg/dL (ref 8–23)
CO2: 27 mmol/L (ref 22–32)
Calcium: 8.9 mg/dL (ref 8.9–10.3)
Chloride: 102 mmol/L (ref 98–111)
Creatinine, Ser: 0.7 mg/dL (ref 0.44–1.00)
GFR, Estimated: >60 mL/min (ref >60)
Glucose, Bld: 103 mg/dL — ABNORMAL HIGH (ref 70–99)
Potassium: 4.3 mmol/L (ref 3.5–5.1)
Sodium: 136 mmol/L (ref 135–145)

## 2021-09-16 LAB — GLUCOSE, CAPILLARY
Glucose-Capillary: 91 mg/dL (ref 70–99)
Glucose-Capillary: 100 mg/dL — ABNORMAL HIGH (ref 70–99)

## 2021-09-16 LAB — HIV ANTIBODY (ROUTINE TESTING W REFLEX): HIV Screen 4th Generation wRfx: NONREACTIVE

## 2021-09-16 MED ORDER — DIPHENHYDRAMINE HCL 25 MG PO CAPS
25.0000 mg | ORAL_CAPSULE | Freq: Once | ORAL | Status: AC
  Administered 2021-09-16: 25 mg via ORAL
  Filled 2021-09-16: qty 1

## 2021-09-16 NOTE — Progress Notes (Signed)
PROGRESS NOTE  [name] KZL:[id number] DOB: 1956-05-05 DOA: 09/15/2021 PCP: Lanelle Bal, PA-C  Brief History:  65 year old female with a history of hypertension, coronary disease, GERD, hyperlipidemia, anxiety presenting with right-sided abdominal pain, right flank pain, and right lower paraspinal pain.  Patient states that she has had right paraspinal pain above her gluteal region for " a long time".  She has some difficulty clarifying exactly how long.  However she notes that she has had development of new right-sided anterior abdominal pain and right flank pain with associated worsening right supragluteal pain since 09/09/2021.  She has had some loose stools but denies any hematochezia or melena.  She has had some subjective fevers and chills.  She denies any headache, neck pain, chest pain, shortness breath, coughing, hemoptysis, dysuria, hematuria.  She thought that this may have been due to her UTI.  She went to see her PCP on 09/12/2021.  Patient stated that the UA did not suggest UTI, but because she had elevated WBC count, the patient was placed on nitrofurantoin.  Her anterior abdominal pain and flank pain continue to worsen.  As result, the patient presented for further evaluation.  She had an episode of nausea and vomiting on the day of admission. In the emergency department, the patient was afebrile hemodynamically stable with oxygen saturation 93-93% on room air. BMP showed sodium 139, potassium 4.0, serum creatinine 0.65.  WBC was 6.0, hemoglobin 13.4, platelets 112,000.  Patient was started on IV Zosyn.  CT of the abdomen and pelvis showed chronic right renal cysts that were not changed.  There was no hydronephrosis.  She had sigmoid diverticulitis without any abscess.  Assessment/Plan: Acute diverticulitis -Patient still having pain having difficulty tolerating diet advancement -Continue clear liquids>>advance diet -Continue Zosyn -Continue IV fluids  Coronary  artery disease -s/p DES to the proximal and distal RCA back in 2012 in the setting of inferior STEMI.  She has known occlusion of the mid to distal RCA with left-to-right collaterals as of 2018 as being managed medically at this point -continue ASA, imdur, amlodipine -no chest pain presently  Mixed HLD -on Repatha -has statin intolerance  Essential Hypertension -continue amlodpine, losartan  Class 2 Obesity -BMI 37.22 -lifestyle modification  Depression/Anxiety -continue cymbalta  GERD -continue PPI     Status [id number] due to severity of illness, need for IV abx and intolerance of diet        Family Communication:   Family at bedside  Consultants:  none  Code Status:  FULL / DNR  DVT Prophylaxis:  [place] Lovenox   Procedures: As Listed in Progress Note Above  Antibiotics: Zosyn 10/20>>      Subjective: Patient denies fevers, chills, headache, chest pain, dyspnea, nausea, vomiting, diarrhea, dysuria, hematuria, hematochezia, and melena.  Complains right lower abd pain--improving   Objective: Vitals:   09/15/21 2034 09/16/21 0216 09/16/21 0610 09/16/21 1031  BP: 128/79 118/82 130/82 107/75  Pulse: 93 81 85 87  Resp: 16 16 16 18   Temp: 98 F (36.7 C) 97.7 F (36.5 C) 98.6 F (37 C) 98.2 F (36.8 C)  TempSrc: Oral Oral Oral Oral  SpO2: 91% 92% 92% 93%  Weight: 92.3 kg     Height:        Intake/Output Summary (Last 24 hours) at 09/16/2021 1257 Last data filed at 09/15/2021 2038 Gross per 24 hour  Intake 281.24 ml  Output --  Net 281.24 ml   Weight  change:  Exam:  General:  Pt is alert, follows commands appropriately, not in acute distress HEENT: No icterus, No thrush, No neck mass, [place]/AT Cardiovascular: RRR, S1/S2, no rubs, no gallops Respiratory: CTA bilaterally, no wheezing, no crackles, no rhonchi Abdomen: Soft/+BS, RLQ tender, non distended, no guarding Extremities: No edema, No lymphangitis, No petechiae, No rashes, no  synovitis   Data Reviewed: I have personally reviewed following labs and imaging studies Basic Metabolic Panel: Recent Labs  Lab 09/15/21 1039 09/16/21 0533  NA 139 136  K 4.0 4.3  CL 104 102  CO2 28 27  GLUCOSE 115* 103*  BUN 11 12  CREATININE 0.65 0.70  CALCIUM 9.1 8.9   Liver Function Tests: No results for input(s): AST, ALT, ALKPHOS, BILITOT, PROT, ALBUMIN in the last 168 hours. No results for input(s): LIPASE, AMYLASE in the last 168 hours. No results for input(s): AMMONIA in the last 168 hours. Coagulation Profile: No results for input(s): INR, PROTIME in the last 168 hours. CBC: Recent Labs  Lab 09/15/21 1039 09/16/21 0533  WBC 6.0 7.2  NEUTROABS 3.5  --   HGB 13.4 12.4  HCT 41.3 39.2  MCV 94.1 95.8  PLT 412* 386   Cardiac Enzymes: No results for input(s): CKTOTAL, CKMB, CKMBINDEX, TROPONINI in the last 168 hours. BNP: Invalid input(s): POCBNP CBG: Recent Labs  Lab 09/16/21 0720 09/16/21 1123  GLUCAP 91 100*   HbA1C: No results for input(s): HGBA1C in the last 72 hours. Urine analysis:    Component Value Date/Time   COLORURINE YELLOW 09/15/2021 1232   APPEARANCEUR HAZY (A) 09/15/2021 1232   LABSPEC 1.023 09/15/2021 1232   PHURINE 5.0 09/15/2021 1232   GLUCOSEU NEGATIVE 09/15/2021 1232   HGBUR NEGATIVE 09/15/2021 1232   BILIRUBINUR NEGATIVE 09/15/2021 [place] 09/15/2021 1232   PROTEINUR NEGATIVE 09/15/2021 1232   UROBILINOGEN 0.2 09/18/2013 1302   NITRITE NEGATIVE 09/15/2021 1232   LEUKOCYTESUR NEGATIVE 09/15/2021 1232   Sepsis Labs: @LABRCNTIP (procalcitonin:4,lacticidven:4) ) Recent Results (from the past 240 hour(s))  Resp Panel by RT-PCR (Flu A&B, Covid) Nasopharyngeal Swab     Status: None   Collection Time: 09/15/21  3:26 PM   Specimen: Nasopharyngeal Swab; Nasopharyngeal(NP) swabs in vial transport medium  Result Value Ref Range Status   SARS Coronavirus 2 by RT PCR NEGATIVE NEGATIVE Final    Comment:  (NOTE) SARS-CoV-2 target nucleic acids are NOT DETECTED.  The SARS-CoV-2 RNA is generally detectable in upper respiratory specimens during the acute phase of infection. The lowest concentration of SARS-CoV-2 viral copies this assay can detect is 138 copies/mL. A negative result does not preclude SARS-Cov-2 infection and should not be used as the sole basis for treatment or other patient management decisions. A negative result may occur with  improper specimen collection/handling, submission of specimen other than nasopharyngeal swab, presence of viral mutation(s) within the areas targeted by this assay, and inadequate number of viral copies(<138 copies/mL). A negative result must be combined with clinical observations, patient history, and epidemiological information. The expected result is Negative.  Fact Sheet for Patients:  EntrepreneurPulse.com.au  Fact Sheet for Healthcare Providers:  IncredibleEmployment.be  This test is no t yet approved or cleared by the [place] FDA and  has been authorized for detection and/or diagnosis of SARS-CoV-2 by FDA under an Emergency Use Authorization (EUA). This EUA will remain  in effect (meaning this test can be used) for the duration of the COVID-19 declaration under Section 564(b)(1) of the Act, 21 U.S.C.section 360bbb-3(b)(1), unless  the authorization is terminated  or revoked sooner.       Influenza A by PCR NEGATIVE NEGATIVE Final   Influenza B by PCR NEGATIVE NEGATIVE Final    Comment: (NOTE) The Xpert Xpress SARS-CoV-2/FLU/RSV plus assay is intended as an aid in the diagnosis of influenza from Nasopharyngeal swab specimens and should not be used as a sole basis for treatment. Nasal washings and aspirates are unacceptable for Xpert Xpress SARS-CoV-2/FLU/RSV testing.  Fact Sheet for Patients: EntrepreneurPulse.com.au  Fact Sheet for Healthcare  Providers: IncredibleEmployment.be  This test is not yet approved or cleared by the [place] FDA and has been authorized for detection and/or diagnosis of SARS-CoV-2 by FDA under an Emergency Use Authorization (EUA). This EUA will remain in effect (meaning this test can be used) for the duration of the COVID-19 declaration under Section 564(b)(1) of the Act, 21 U.S.C. section 360bbb-3(b)(1), unless the authorization is terminated or revoked.  Performed at [facility], [address], [place], [place] [address]      Scheduled Meds:  amLODipine  5 mg Oral Daily   aspirin EC  81 mg Oral Daily   DULoxetine  60 mg Oral Daily   enoxaparin (LOVENOX) injection  40 mg Subcutaneous Q24H   isosorbide mononitrate  30 mg Oral Daily   losartan  100 mg Oral Daily   pantoprazole (PROTONIX) IV  40 mg Intravenous Q24H   zolpidem  10 mg Oral QHS   Continuous Infusions:  piperacillin-tazobactam (ZOSYN)  IV 3.375 g (09/16/21 0609)    Procedures/Studies: CT Renal Stone Study  Result Date: 09/15/2021 CLINICAL DATA:  65 year old female with severe right flank and back pain. Recently placed on antibiotics for possible UTI. EXAM: CT ABDOMEN AND PELVIS WITHOUT CONTRAST TECHNIQUE: Multidetector CT imaging of the abdomen and pelvis was performed following the standard protocol without IV contrast. COMPARISON:  CT Abdomen and Pelvis 04/10/2016. FINDINGS: Lower chest: Tortuous descending thoracic aorta. Cardiac size at the upper limits of normal. Mild respiratory motion. Negative lung bases. Hepatobiliary: Chronically absent gallbladder. Negative noncontrast liver. Pancreas: Fatty atrophy. Spleen: Negative. Adrenals/Urinary Tract: Normal adrenal glands. Chronic right renal cysts have simple fluid density and are not significantly changed from 2017. No nephrolithiasis or hydronephrosis. Right ureter is decompressed and normal to the bladder. Incidental pelvic phleboliths. Unremarkable  bladder. Stomach/Bowel: Negative rectum. Sigmoid diverticulosis and active inflammation (series 2, image 66 and coronal image 87) with inflammatory stranding in the deep pelvis. A sigmoid segment of about 6 cm is affected. Upstream sigmoid colon is decompressed with extensive diverticulosis continuing through the descending colon. Retained stool in redundant transverse colon. Similar retained stool in the right colon. Normal appendix on coronal image 68. No other large bowel inflammation. Negative terminal ileum. No dilated small bowel. Possible small gastric hiatal hernia but otherwise decompressed and negative stomach. Negative duodenum. No free air, free fluid. Vascular/Lymphatic: Aortoiliac calcified atherosclerosis. Normal caliber abdominal aorta. No lymphadenopathy. Reproductive: Negative noncontrast appearance. Other: No pelvic free fluid. Musculoskeletal: Generally mild for age lumbar spine degeneration, stable since 2017. No acute osseous abnormality identified. IMPRESSION: 1. Acute Diverticulitis of the mid sigmoid colon in the pelvis. No associated abscess or other complicating features. Widespread underlying descending and sigmoid colon diverticula. 2. No other acute or inflammatory process identified in the non-contrast abdomen or pelvis. 3. Aortic Atherosclerosis (ICD10-I70.0). Electronically Signed   By: Genevie Ann M.D.   On: 09/15/2021 11:24    Orson Eva, DO  [facility]  If 7PM-7AM, please contact night-coverage [email or web address] Password [email or web address]  09/16/2021, 12:57 PM   LOS: 0 days

## 2021-09-17 ENCOUNTER — Inpatient Hospital Stay (HOSPITAL_COMMUNITY): Payer: BC Managed Care – PPO

## 2021-09-17 DIAGNOSIS — I10 Essential (primary) hypertension: Secondary | ICD-10-CM

## 2021-09-17 DIAGNOSIS — K5792 Diverticulitis of intestine, part unspecified, without perforation or abscess without bleeding: Secondary | ICD-10-CM | POA: Diagnosis not present

## 2021-09-17 MED ORDER — SENNA 8.6 MG PO TABS
2.0000 | ORAL_TABLET | Freq: Every day | ORAL | Status: DC
  Administered 2021-09-17 – 2021-09-18 (×2): 17.2 mg via ORAL
  Filled 2021-09-17 (×2): qty 2

## 2021-09-17 MED ORDER — IOHEXOL 300 MG/ML  SOLN
100.0000 mL | Freq: Once | INTRAMUSCULAR | Status: AC | PRN
  Administered 2021-09-17: 100 mL via INTRAVENOUS

## 2021-09-17 MED ORDER — CYCLOBENZAPRINE HCL 10 MG PO TABS
5.0000 mg | ORAL_TABLET | Freq: Three times a day (TID) | ORAL | Status: DC
  Administered 2021-09-17 – 2021-09-18 (×4): 5 mg via ORAL
  Filled 2021-09-17 (×4): qty 1

## 2021-09-17 MED ORDER — POLYETHYLENE GLYCOL 3350 17 G PO PACK
17.0000 g | PACK | Freq: Every day | ORAL | Status: DC
  Administered 2021-09-17 – 2021-09-18 (×2): 17 g via ORAL
  Filled 2021-09-17: qty 1

## 2021-09-17 NOTE — Progress Notes (Signed)
PROGRESS NOTE  LATEYA [name] IOM:[id number] DOB: 06/20/1956 DOA: 09/15/2021 PCP: Lanelle Bal, PA-C  Brief History:  65 year old female with a history of hypertension, coronary disease, GERD, hyperlipidemia, anxiety presenting with right-sided abdominal pain, right flank pain, and right lower paraspinal pain.  Patient states that she has had right paraspinal pain above her gluteal region for " a long time".  She has some difficulty clarifying exactly how long.  However she notes that she has had development of new right-sided anterior abdominal pain and right flank pain with associated worsening right supragluteal pain since 09/09/2021.  She has had some loose stools but denies any hematochezia or melena.  She has had some subjective fevers and chills.  She denies any headache, neck pain, chest pain, shortness breath, coughing, hemoptysis, dysuria, hematuria.  She thought that this may have been due to her UTI.  She went to see her PCP on 09/12/2021.  Patient stated that the UA did not suggest UTI, but because she had elevated WBC count, the patient was placed on nitrofurantoin.  Her anterior abdominal pain and flank pain continue to worsen.  As result, the patient presented for further evaluation.  She had an episode of nausea and vomiting on the day of admission. In the emergency department, the patient was afebrile hemodynamically stable with oxygen saturation 93-93% on room air. BMP showed sodium 139, potassium 4.0, serum creatinine 0.65.  WBC was 6.0, hemoglobin 13.4, platelets 112,000.  Patient was started on IV Zosyn.  CT of the abdomen and pelvis showed chronic right renal cysts that were not changed.  There was no hydronephrosis.  She had sigmoid diverticulitis without any abscess.   Assessment/Plan: Acute diverticulitis -Patient still having pain having difficulty tolerating diet advancement -Continue clear liquids>>advance diet -Continue Zosyn -Continue IV fluids  Back  Pain -?musculoskeletal strain vs viscero-somatic response -CT lumbar spine--No advanced finding. Mild stenosis of the lateral recesses at L2-3 and L3-4 without neural compression.  Mild stenosis of lateral recesses L4-5, L5-S1 -UA neg -09/15/21 CT renal stone protocol neg for hydronephrosis -add flexeril   Coronary artery disease -s/p DES to the proximal and distal RCA back in 2012 in the setting of inferior STEMI.  She has known occlusion of the mid to distal RCA with left-to-right collaterals as of 2018 as being managed medically at this point -continue ASA, imdur, amlodipine -no chest pain presently   Mixed HLD -on Repatha -has statin intolerance   Essential Hypertension -continue amlodpine, losartan   Class 2 Obesity -BMI 37.22 -lifestyle modification   Depression/Anxiety -continue cymbalta   GERD -continue PPI         Status [id number] due to severity of illness, need for IV abx and intolerance of diet               Family Communication:   spouse updated at bedside 09/17/21   Consultants:  none   Code Status:  FULL   DVT Prophylaxis:  [place] Lovenox     Procedures: As Listed in Progress Note Above   Antibiotics: Zosyn 10/20>>      Subjective: Patient states pain in back and right flank a bit worse, but better with BM.  Had pain go to legs.  No fall.  No legs giving way.  Denies f/c, cp, n/v/d.  Objective: Vitals:   09/16/21 1031 09/16/21 2142 09/17/21 0555 09/17/21 1243  BP: 107/75 (!) 148/86 (!) 155/90 (!) 139/97  Pulse: 87 92 99 90  Resp: 18 17 15 18   Temp: 98.2 F (36.8 C) 98.1 F (36.7 C) 97.6 F (36.4 C) 97.9 F (36.6 C)  TempSrc: Oral Oral Oral Oral  SpO2: 93% 91% 91% 95%  Weight:      Height:        Intake/Output Summary (Last 24 hours) at 09/17/2021 1434 Last data filed at 09/17/2021 1100 Gross per 24 hour  Intake 651.84 ml  Output --  Net 651.84 ml   Weight change:  Exam:  General:  Pt is alert, follows commands  appropriately, not in acute distress HEENT: No icterus, No thrush, No neck mass, [place]/AT Cardiovascular: RRR, S1/S2, no rubs, no gallops Respiratory: CTA bilaterally, no wheezing, no crackles, no rhonchi Abdomen: Soft/+BS, RLQ tender, non distended, no guarding Extremities: No edema, No lymphangitis, No petechiae, No rashes, no synovitis;  right lumbar paraspinal tender--no rash, no induration   Data Reviewed: I have personally reviewed following labs and imaging studies Basic Metabolic Panel: Recent Labs  Lab 09/15/21 1039 09/16/21 0533  NA 139 136  K 4.0 4.3  CL 104 102  CO2 28 27  GLUCOSE 115* 103*  BUN 11 12  CREATININE 0.65 0.70  CALCIUM 9.1 8.9   Liver Function Tests: No results for input(s): AST, ALT, ALKPHOS, BILITOT, PROT, ALBUMIN in the last 168 hours. No results for input(s): LIPASE, AMYLASE in the last 168 hours. No results for input(s): AMMONIA in the last 168 hours. Coagulation Profile: No results for input(s): INR, PROTIME in the last 168 hours. CBC: Recent Labs  Lab 09/15/21 1039 09/16/21 0533  WBC 6.0 7.2  NEUTROABS 3.5  --   HGB 13.4 12.4  HCT 41.3 39.2  MCV 94.1 95.8  PLT 412* 386   Cardiac Enzymes: No results for input(s): CKTOTAL, CKMB, CKMBINDEX, TROPONINI in the last 168 hours. BNP: Invalid input(s): POCBNP CBG: Recent Labs  Lab 09/16/21 0720 09/16/21 1123  GLUCAP 91 100*   HbA1C: No results for input(s): HGBA1C in the last 72 hours. Urine analysis:    Component Value Date/Time   COLORURINE YELLOW 09/15/2021 1232   APPEARANCEUR HAZY (A) 09/15/2021 1232   LABSPEC 1.023 09/15/2021 1232   PHURINE 5.0 09/15/2021 1232   GLUCOSEU NEGATIVE 09/15/2021 1232   HGBUR NEGATIVE 09/15/2021 [place] 09/15/2021 [place] 09/15/2021 1232   PROTEINUR NEGATIVE 09/15/2021 1232   UROBILINOGEN 0.2 09/18/2013 1302   NITRITE NEGATIVE 09/15/2021 1232   LEUKOCYTESUR NEGATIVE 09/15/2021 1232   Sepsis  Labs: @LABRCNTIP (procalcitonin:4,lacticidven:4) ) Recent Results (from the past 240 hour(s))  Resp Panel by RT-PCR (Flu A&B, Covid) Nasopharyngeal Swab     Status: None   Collection Time: 09/15/21  3:26 PM   Specimen: Nasopharyngeal Swab; Nasopharyngeal(NP) swabs in vial transport medium  Result Value Ref Range Status   SARS Coronavirus 2 by RT PCR NEGATIVE NEGATIVE Final    Comment: (NOTE) SARS-CoV-2 target nucleic acids are NOT DETECTED.  The SARS-CoV-2 RNA is generally detectable in upper respiratory specimens during the acute phase of infection. The lowest concentration of SARS-CoV-2 viral copies this assay can detect is 138 copies/mL. A negative result does not preclude SARS-Cov-2 infection and should not be used as the sole basis for treatment or other patient management decisions. A negative result may occur with  improper specimen collection/handling, submission of specimen other than nasopharyngeal swab, presence of viral mutation(s) within the areas targeted by this assay, and inadequate number of viral copies(<138 copies/mL). A negative result must be combined with clinical  observations, patient history, and epidemiological information. The expected result is Negative.  Fact Sheet for Patients:  EntrepreneurPulse.com.au  Fact Sheet for Healthcare Providers:  IncredibleEmployment.be  This test is no t yet approved or cleared by the [place] FDA and  has been authorized for detection and/or diagnosis of SARS-CoV-2 by FDA under an Emergency Use Authorization (EUA). This EUA will remain  in effect (meaning this test can be used) for the duration of the COVID-19 declaration under Section 564(b)(1) of the Act, 21 U.S.C.section 360bbb-3(b)(1), unless the authorization is terminated  or revoked sooner.       Influenza A by PCR NEGATIVE NEGATIVE Final   Influenza B by PCR NEGATIVE NEGATIVE Final    Comment: (NOTE) The Xpert Xpress  SARS-CoV-2/FLU/RSV plus assay is intended as an aid in the diagnosis of influenza from Nasopharyngeal swab specimens and should not be used as a sole basis for treatment. Nasal washings and aspirates are unacceptable for Xpert Xpress SARS-CoV-2/FLU/RSV testing.  Fact Sheet for Patients: EntrepreneurPulse.com.au  Fact Sheet for Healthcare Providers: IncredibleEmployment.be  This test is not yet approved or cleared by the [place] FDA and has been authorized for detection and/or diagnosis of SARS-CoV-2 by FDA under an Emergency Use Authorization (EUA). This EUA will remain in effect (meaning this test can be used) for the duration of the COVID-19 declaration under Section 564(b)(1) of the Act, 21 U.S.C. section 360bbb-3(b)(1), unless the authorization is terminated or revoked.  Performed at [facility], [address] [place], [place] [address]      Scheduled Meds:  amLODipine  5 mg Oral Daily   aspirin EC  81 mg Oral Daily   cyclobenzaprine  5 mg Oral TID   DULoxetine  60 mg Oral Daily   enoxaparin (LOVENOX) injection  40 mg Subcutaneous Q24H   isosorbide mononitrate  30 mg Oral Daily   losartan  100 mg Oral Daily   pantoprazole (PROTONIX) IV  40 mg Intravenous Q24H   zolpidem  10 mg Oral QHS   Continuous Infusions:  piperacillin-tazobactam (ZOSYN)  IV 3.375 g (09/17/21 0511)    Procedures/Studies: CT Renal Stone Study  Result Date: 09/15/2021 CLINICAL DATA:  65 year old female with severe right flank and back pain. Recently placed on antibiotics for possible UTI. EXAM: CT ABDOMEN AND PELVIS WITHOUT CONTRAST TECHNIQUE: Multidetector CT imaging of the abdomen and pelvis was performed following the standard protocol without IV contrast. COMPARISON:  CT Abdomen and Pelvis 04/10/2016. FINDINGS: Lower chest: Tortuous descending thoracic aorta. Cardiac size at the upper limits of normal. Mild respiratory motion. Negative lung bases.  Hepatobiliary: Chronically absent gallbladder. Negative noncontrast liver. Pancreas: Fatty atrophy. Spleen: Negative. Adrenals/Urinary Tract: Normal adrenal glands. Chronic right renal cysts have simple fluid density and are not significantly changed from 2017. No nephrolithiasis or hydronephrosis. Right ureter is decompressed and normal to the bladder. Incidental pelvic phleboliths. Unremarkable bladder. Stomach/Bowel: Negative rectum. Sigmoid diverticulosis and active inflammation (series 2, image 66 and coronal image 87) with inflammatory stranding in the deep pelvis. A sigmoid segment of about 6 cm is affected. Upstream sigmoid colon is decompressed with extensive diverticulosis continuing through the descending colon. Retained stool in redundant transverse colon. Similar retained stool in the right colon. Normal appendix on coronal image 68. No other large bowel inflammation. Negative terminal ileum. No dilated small bowel. Possible small gastric hiatal hernia but otherwise decompressed and negative stomach. Negative duodenum. No free air, free fluid. Vascular/Lymphatic: Aortoiliac calcified atherosclerosis. Normal caliber abdominal aorta. No lymphadenopathy. Reproductive: Negative noncontrast  appearance. Other: No pelvic free fluid. Musculoskeletal: Generally mild for age lumbar spine degeneration, stable since 2017. No acute osseous abnormality identified. IMPRESSION: 1. Acute Diverticulitis of the mid sigmoid colon in the pelvis. No associated abscess or other complicating features. Widespread underlying descending and sigmoid colon diverticula. 2. No other acute or inflammatory process identified in the non-contrast abdomen or pelvis. 3. Aortic Atherosclerosis (ICD10-I70.0). Electronically Signed   By: Genevie Ann M.D.   On: 09/15/2021 11:24    Orson Eva, DO  [facility]  If 7PM-7AM, please contact night-coverage [email or web address] Password [email or web address] 09/17/2021, 2:34 PM   LOS: 1 day

## 2021-09-17 NOTE — Progress Notes (Signed)
Pharmacy Antibiotic Note  [name] is a 65 y.o. female  presented  on 09/15/2021 with persistent left lower back and lower left quadrant abdominal pain along with urinary frequency and dysuria.  CT renal stone study shows acute diverticulitis.   UA not suggestive of UTI.  Pharmacy has been consulted for Zosyn dosing for intra-abdominal infection.    Plan:  Zosyn 3.375 g IV q8hr, infuse each dose over 4 hrs Monitor clinical status, renal function and culture results daily.   Height: 5\' 2"  (157.5 cm) Weight: 92.3 kg (203 lb 7.8 oz) IBW/kg (Calculated) : 50.1  Temp (24hrs), Avg:97.9 F (36.6 C), Min:97.6 F (36.4 C), Max:98.1 F (36.7 C)  Recent Labs  Lab 09/15/21 1039 09/16/21 0533  WBC 6.0 7.2  CREATININE 0.65 0.70  LATICACIDVEN 1.0  --      Estimated Creatinine Clearance: 75.1 mL/min (by C-G formula based on SCr of 0.7 mg/dL).    Allergies  Allergen Reactions   Ancef [Cefazolin] Itching    Itching around mouth - sx resolved quickly after administration of benadryl; with no further sx   Ciprofloxacin Hcl     unknown   Meat [Alpha-Gal] Diarrhea   Metaxalone     unknown   Morphine And Related Itching    Mild itching to head  and extremities, self resolved without intervention.    Sulfa Antibiotics Itching, Rash and Other (See Comments)    "severe muscle cramps, tongue cracked, dry mouth "    Antimicrobials this admission: Zosyn 10/20>>   Microbiology results: 10/20 Covid-19 PCR: negative; flu pcr: negative   Thank you for allowing pharmacy to be a part of this patient's care.  Thomasenia Sales, PharmD, MBA, BCGP Clinical Pharmacist  09/17/2021 10:54 AM

## 2021-09-18 DIAGNOSIS — K5792 Diverticulitis of intestine, part unspecified, without perforation or abscess without bleeding: Secondary | ICD-10-CM | POA: Diagnosis not present

## 2021-09-18 DIAGNOSIS — E669 Obesity, unspecified: Secondary | ICD-10-CM

## 2021-09-18 DIAGNOSIS — I10 Essential (primary) hypertension: Secondary | ICD-10-CM | POA: Diagnosis not present

## 2021-09-18 LAB — BASIC METABOLIC PANEL
Anion gap: 5 (ref 5–15)
BUN: 9 mg/dL (ref 8–23)
CO2: 32 mmol/L (ref 22–32)
Calcium: 9.2 mg/dL (ref 8.9–10.3)
Chloride: 101 mmol/L (ref 98–111)
Creatinine, Ser: 0.87 mg/dL (ref 0.44–1.00)
GFR, Estimated: >60 mL/min (ref >60)
Glucose, Bld: 107 mg/dL — ABNORMAL HIGH (ref 70–99)
Potassium: 4.3 mmol/L (ref 3.5–5.1)
Sodium: 138 mmol/L (ref 135–145)

## 2021-09-18 LAB — MAGNESIUM: Magnesium: 1.9 mg/dL (ref 1.7–2.4)

## 2021-09-18 LAB — CBC
HCT: 36 % (ref 36.0–46.0)
Hemoglobin: 11.3 g/dL — ABNORMAL LOW (ref 12.0–15.0)
MCH: 30.1 pg (ref 26.0–34.0)
MCHC: 31.4 g/dL (ref 30.0–36.0)
MCV: 96 fL (ref 80.0–100.0)
Platelets: 363 10*3/uL (ref 150–400)
RBC: 3.75 MIL/uL — ABNORMAL LOW (ref 3.87–5.11)
RDW: 13.2 % (ref 11.5–15.5)
WBC: 5.9 10*3/uL (ref 4.0–10.5)
nRBC: 0 % (ref 0.0–0.2)

## 2021-09-18 MED ORDER — OXYCODONE HCL 5 MG PO TABS: 5.0000 mg | ORAL_TABLET | ORAL | Status: DC | PRN

## 2021-09-18 MED ORDER — OXYCODONE HCL 5 MG PO TABS: 5.0000 mg | ORAL_TABLET | ORAL | 0 refills | Status: DC | PRN

## 2021-09-18 MED ORDER — AMOXICILLIN-POT CLAVULANATE 875-125 MG PO TABS: 1.0000 | ORAL_TABLET | Freq: Two times a day (BID) | ORAL | 0 refills | Status: DC

## 2021-09-18 MED ORDER — AMOXICILLIN-POT CLAVULANATE 875-125 MG PO TABS
1.0000 | ORAL_TABLET | Freq: Two times a day (BID) | ORAL | Status: DC
  Administered 2021-09-18: 1 via ORAL
  Filled 2021-09-18: qty 1

## 2021-09-18 MED ORDER — POLYETHYLENE GLYCOL 3350 17 G PO PACK: 17.0000 g | PACK | Freq: Every day | ORAL | 0 refills | Status: DC

## 2021-09-18 MED ORDER — CYCLOBENZAPRINE HCL 5 MG PO TABS: 5.0000 mg | ORAL_TABLET | Freq: Three times a day (TID) | ORAL | 0 refills | Status: DC

## 2021-09-18 NOTE — Progress Notes (Signed)
Pt has discharge orders at this time. Discharge teaching given and no further questions at this time. Awaiting arrival of pts ride.

## 2021-09-18 NOTE — Discharge Summary (Signed)
Physician Discharge Summary  [name] KCL:[id number] DOB: 1956-10-12 DOA: 09/15/2021  PCP: Lanelle Bal, PA-C  Admit date: 09/15/2021 Discharge date: 09/18/2021  Admitted From: Home Disposition:  Home  Recommendations for Outpatient Follow-up:  Follow up with PCP in 1-2 weeks Please obtain BMP/CBC in one week     Discharge Condition: Stable CODE STATUS: FULL Diet recommendation: Heart Healthy   Brief/Interim Summary: 65 year old female with a history of hypertension, coronary disease, GERD, hyperlipidemia, anxiety presenting with right-sided abdominal pain, right flank pain, and right lower paraspinal pain.  Patient states that she has had right paraspinal pain above her gluteal region for " a long time".  She has some difficulty clarifying exactly how long.  However she notes that she has had development of new right-sided anterior abdominal pain and right flank pain with associated worsening right supragluteal pain since 09/09/2021.  She has had some loose stools but denies any hematochezia or melena.  She has had some subjective fevers and chills.  She denies any headache, neck pain, chest pain, shortness breath, coughing, hemoptysis, dysuria, hematuria.  She thought that this may have been due to her UTI.  She went to see her PCP on 09/12/2021.  Patient stated that the UA did not suggest UTI, but because she had elevated WBC count, the patient was placed on nitrofurantoin.  Her anterior abdominal pain and flank pain continue to worsen.  As result, the patient presented for further evaluation.  She had an episode of nausea and vomiting on the day of admission. In the emergency department, the patient was afebrile hemodynamically stable with oxygen saturation 93-93% on room air. BMP showed sodium 139, potassium 4.0, serum creatinine 0.65.  WBC was 6.0, hemoglobin 13.4, platelets 112,000.  Patient was started on IV Zosyn.  CT of the abdomen and pelvis showed chronic right renal  cysts that were not changed.  There was no hydronephrosis.  She had sigmoid diverticulitis without any abscess.  Discharge Diagnoses:  Acute diverticulitis -initally had worsen pain, felt more due to constipation -improving after BMs -Continue clear liquids>>advance diet which pt is tolerating -Continue Zosyn>>d/c home with amox/clav x 7 more days -Continue IV fluids   Back Pain -musculoskeletal strain vs viscero-somatic response -CT lumbar spine--No advanced finding. Mild stenosis of the lateral recesses at L2-3 and L3-4 without neural compression.  Mild stenosis of lateral recesses L4-5, L5-S1 -UA neg -09/15/21 CT renal stone protocol neg for hydronephrosis -added flexeril>>improving   Coronary artery disease -s/p DES to the proximal and distal RCA back in 2012 in the setting of inferior STEMI.  She has known occlusion of the mid to distal RCA with left-to-right collaterals as of 2018 as being managed medically at this point -continue ASA, imdur, amlodipine -no chest pain presently   Mixed HLD -on Repatha -has statin intolerance   Essential Hypertension -continue amlodpine, losartan   Class 2 Obesity -BMI 37.22 -lifestyle modification   Depression/Anxiety -continue cymbalta   GERD -continue PPI  Constipation -miralax daily -added senna while on opioids  Personal hx of cefazolin allergy -tolerating zosyn during this hospitalization   Discharge Instructions   Allergies as of 09/18/2021       Reactions   Ancef [cefazolin] Itching   Itching around mouth - sx resolved quickly after administration of benadryl; with no further sx   Ciprofloxacin Hcl    unknown   Meat [alpha-gal] Diarrhea   Metaxalone    unknown   Morphine And Related Itching   Mild itching to head  and extremities, self resolved without intervention.    Sulfa Antibiotics Itching, Rash, Other (See Comments)   "severe muscle cramps, tongue cracked, dry mouth "        Medication List      STOP taking these medications    furosemide 40 MG tablet Commonly known as: LASIX   hydrocortisone 2.5 % rectal cream Commonly known as: ANUSOL-HC   nitrofurantoin (macrocrystal-monohydrate) 100 MG capsule Commonly known as: MACROBID       TAKE these medications    acetaminophen 650 MG CR tablet Commonly known as: TYLENOL Take 650 mg by mouth every 8 (eight) hours as needed for pain. Takes 2 tabs twice a day.   amLODipine 5 MG tablet Commonly known as: NORVASC Take 5 mg by mouth daily. What changed: Another medication with the same name was removed. Continue taking this medication, and follow the directions you see here.   amoxicillin-clavulanate 875-125 MG tablet Commonly known as: AUGMENTIN Take 1 tablet by mouth every 12 (twelve) hours.   aspirin 81 MG tablet Take 81 mg by mouth daily.   busPIRone 5 MG tablet Commonly known as: BUSPAR Take 5 mg by mouth 2 (two) times daily as needed.   cyclobenzaprine 5 MG tablet Commonly known as: FLEXERIL Take 1 tablet (5 mg total) by mouth 3 (three) times daily.   DULoxetine 60 MG capsule Commonly known as: CYMBALTA Take 60 mg by mouth daily.   isosorbide mononitrate 30 MG 24 hr tablet Commonly known as: IMDUR TAKE 1 TABLET(30 MG) BY MOUTH DAILY   LORazepam 0.5 MG tablet Commonly known as: ATIVAN Take 0.5 mg by mouth daily as needed.   losartan 100 MG tablet Commonly known as: COZAAR Take 1 tablet (100 mg total) by mouth daily.   nitroGLYCERIN 0.4 MG SL tablet Commonly known as: NITROSTAT Place 1 tablet (0.4 mg total) under the tongue every 5 (five) minutes as needed for chest pain.   oxyCODONE 5 MG immediate release tablet Commonly known as: Oxy IR/ROXICODONE Take 1 tablet (5 mg total) by mouth every 4 (four) hours as needed for moderate pain.   pantoprazole 40 MG tablet Commonly known as: PROTONIX Take 1 tablet (40 mg total) by mouth 2 (two) times daily before a meal.   polyethylene glycol 17 g  packet Commonly known as: MIRALAX / GLYCOLAX Take 17 g by mouth daily. Start taking on: September 19, 7352   Repatha SureClick 299 MG/ML Soaj Generic drug: Evolocumab ADMINISTER 1 ML UNDER THE SKIN EVERY 14 DAYS AS DIRECTED   zolpidem 10 MG tablet Commonly known as: AMBIEN Take 10 mg by mouth at bedtime.        Allergies  Allergen Reactions   Ancef [Cefazolin] Itching    Itching around mouth - sx resolved quickly after administration of benadryl; with no further sx   Ciprofloxacin Hcl     unknown   Meat [Alpha-Gal] Diarrhea   Metaxalone     unknown   Morphine And Related Itching    Mild itching to head  and extremities, self resolved without intervention.    Sulfa Antibiotics Itching, Rash and Other (See Comments)    "severe muscle cramps, tongue cracked, dry mouth "    Consultations: none   Procedures/Studies: CT LUMBAR SPINE W CONTRAST  Result Date: 09/17/2021 CLINICAL DATA:  Low back pain, greater than 6 weeks. Radicular symptoms affecting both legs. EXAM: CT LUMBAR SPINE WITH CONTRAST TECHNIQUE: Multidetector CT imaging of the lumbar spine was performed with intravenous contrast administration. CONTRAST:  140mL OMNIPAQUE IOHEXOL 300 MG/ML  SOLN COMPARISON:  None. FINDINGS: Segmentation: 5 lumbar type vertebral bodies. L5 does have a transitional articulation on the left. Alignment: Normal. Vertebrae: No fracture or focal bone lesion. Paraspinal and other soft tissues: Aortic atherosclerosis. Renal cysts. Disc levels: No significant disc level finding from T10-11 through L1-2. L2-3: Bulging of the disc. Mild facet hypertrophy. Mild lateral recess narrowing but no likely neural compression. L3-4: Bulging of the disc. Facet and ligamentous hypertrophy. Mild lateral recess narrowing but no likely neural compression. L4-5: Bulging of the disc. Facet and ligamentous hypertrophy. Mild stenosis of the lateral recesses which could possibly be symptomatic. L5-S1: Minimal disc bulge.  Mild facet osteoarthritis. No stenosis. Degenerative changes of the transitional articulation on the left. IMPRESSION: No advanced finding. Mild stenosis of the lateral recesses at L2-3 and L3-4 without likely neural compression. Slightly more pronounced lateral recess stenosis at L4-5 which could possibly be symptomatic. 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 Additionally, the transitional articulation on the left at L5-S1 shows degenerative change and could be painful. Electronically Signed   By: Nelson Chimes M.D.   On: 09/17/2021 16:49   CT Renal Stone Study  Result Date: 09/15/2021 CLINICAL DATA:  66 year old female with severe right flank and back pain. Recently placed on antibiotics for possible UTI. EXAM: CT ABDOMEN AND PELVIS WITHOUT CONTRAST TECHNIQUE: Multidetector CT imaging of the abdomen and pelvis was performed following the standard protocol without IV contrast. COMPARISON:  CT Abdomen and Pelvis 04/10/2016. FINDINGS: Lower chest: Tortuous descending thoracic aorta. Cardiac size at the upper limits of normal. Mild respiratory motion. Negative lung bases. Hepatobiliary: Chronically absent gallbladder. Negative noncontrast liver. Pancreas: Fatty atrophy. Spleen: Negative. Adrenals/Urinary Tract: Normal adrenal glands. Chronic right renal cysts have simple fluid density and are not significantly changed from 2017. No nephrolithiasis or hydronephrosis. Right ureter is decompressed and normal to the bladder. Incidental pelvic phleboliths. Unremarkable bladder. Stomach/Bowel: Negative rectum. Sigmoid diverticulosis and active inflammation (series 2, image 66 and coronal image 87) with inflammatory stranding in the deep pelvis. A sigmoid segment of about 6 cm is affected. Upstream sigmoid colon is decompressed with extensive diverticulosis continuing through the descending colon. Retained stool in redundant transverse colon. Similar retained stool in the right colon.  Normal appendix on coronal image 68. No other large bowel inflammation. Negative terminal ileum. No dilated small bowel. Possible small gastric hiatal hernia but otherwise decompressed and negative stomach. Negative duodenum. No free air, free fluid. Vascular/Lymphatic: Aortoiliac calcified atherosclerosis. Normal caliber abdominal aorta. No lymphadenopathy. Reproductive: Negative noncontrast appearance. Other: No pelvic free fluid. Musculoskeletal: Generally mild for age lumbar spine degeneration, stable since 2017. No acute osseous abnormality identified. IMPRESSION: 1. Acute Diverticulitis of the mid sigmoid colon in the pelvis. No associated abscess or other complicating features. Widespread underlying descending and sigmoid colon diverticula. 2. No other acute or inflammatory process identified in the non-contrast abdomen or pelvis. 3. Aortic Atherosclerosis (ICD10-I70.0). Electronically Signed   By: Genevie Ann M.D.   On: 09/15/2021 11:24        Discharge Exam: Vitals:   09/17/21 2035 09/18/21 0457  BP: (!) 112/57 105/63  Pulse: 80 76  Resp: 18 18  Temp: 98 F (36.7 C) 98 F (36.7 C)  SpO2: 94% 97%   Vitals:   09/17/21 0555 09/17/21 1243 09/17/21 2035 09/18/21 0457  BP: (!) 155/90 (!) 139/97 (!) 112/57 105/63  Pulse: 99 90 80 76  Resp: 15 18 18 18   Temp:  97.6 F (36.4 C) 97.9 F (36.6 C) 98 F (36.7 C) 98 F (36.7 C)  TempSrc: Oral Oral    SpO2: 91% 95% 94% 97%  Weight:      Height:        General: Pt is alert, awake, not in acute distress Cardiovascular: RRR, S1/S2 +, no rubs, no gallops Respiratory: CTA bilaterally, no wheezing, no rhonchi Abdominal: Soft, mild RLQ, ND, bowel sounds +;  right flank, lower back without rash or induration or crepitance Extremities: no edema, no cyanosis   The results of significant diagnostics from this hospitalization (including imaging, microbiology, ancillary and laboratory) are listed below for reference.    Significant Diagnostic  Studies: CT LUMBAR SPINE W CONTRAST  Result Date: 09/17/2021 CLINICAL DATA:  Low back pain, greater than 6 weeks. Radicular symptoms affecting both legs. EXAM: CT LUMBAR SPINE WITH CONTRAST TECHNIQUE: Multidetector CT imaging of the lumbar spine was performed with intravenous contrast administration. CONTRAST:  13mL OMNIPAQUE IOHEXOL 300 MG/ML  SOLN COMPARISON:  None. FINDINGS: Segmentation: 5 lumbar type vertebral bodies. L5 does have a transitional articulation on the left. Alignment: Normal. Vertebrae: No fracture or focal bone lesion. Paraspinal and other soft tissues: Aortic atherosclerosis. Renal cysts. Disc levels: No significant disc level finding from T10-11 through L1-2. L2-3: Bulging of the disc. Mild facet hypertrophy. Mild lateral recess narrowing but no likely neural compression. L3-4: Bulging of the disc. Facet and ligamentous hypertrophy. Mild lateral recess narrowing but no likely neural compression. L4-5: Bulging of the disc. Facet and ligamentous hypertrophy. Mild stenosis of the lateral recesses which could possibly be symptomatic. L5-S1: Minimal disc bulge. Mild facet osteoarthritis. No stenosis. Degenerative changes of the transitional articulation on the left. IMPRESSION: No advanced finding. Mild stenosis of the lateral recesses at L2-3 and L3-4 without likely neural compression. Slightly more pronounced lateral recess stenosis at L4-5 which could possibly be symptomatic. 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 Additionally, the transitional articulation on the left at L5-S1 shows degenerative change and could be painful. Electronically Signed   By: Nelson Chimes M.D.   On: 09/17/2021 16:49   CT Renal Stone Study  Result Date: 09/15/2021 CLINICAL DATA:  65 year old female with severe right flank and back pain. Recently placed on antibiotics for possible UTI. EXAM: CT ABDOMEN AND PELVIS WITHOUT CONTRAST TECHNIQUE: Multidetector CT imaging of the abdomen  and pelvis was performed following the standard protocol without IV contrast. COMPARISON:  CT Abdomen and Pelvis 04/10/2016. FINDINGS: Lower chest: Tortuous descending thoracic aorta. Cardiac size at the upper limits of normal. Mild respiratory motion. Negative lung bases. Hepatobiliary: Chronically absent gallbladder. Negative noncontrast liver. Pancreas: Fatty atrophy. Spleen: Negative. Adrenals/Urinary Tract: Normal adrenal glands. Chronic right renal cysts have simple fluid density and are not significantly changed from 2017. No nephrolithiasis or hydronephrosis. Right ureter is decompressed and normal to the bladder. Incidental pelvic phleboliths. Unremarkable bladder. Stomach/Bowel: Negative rectum. Sigmoid diverticulosis and active inflammation (series 2, image 66 and coronal image 87) with inflammatory stranding in the deep pelvis. A sigmoid segment of about 6 cm is affected. Upstream sigmoid colon is decompressed with extensive diverticulosis continuing through the descending colon. Retained stool in redundant transverse colon. Similar retained stool in the right colon. Normal appendix on coronal image 68. No other large bowel inflammation. Negative terminal ileum. No dilated small bowel. Possible small gastric hiatal hernia but otherwise decompressed and negative stomach. Negative duodenum. No free air, free fluid. Vascular/Lymphatic: Aortoiliac calcified atherosclerosis. Normal caliber abdominal aorta.  No lymphadenopathy. Reproductive: Negative noncontrast appearance. Other: No pelvic free fluid. Musculoskeletal: Generally mild for age lumbar spine degeneration, stable since 2017. No acute osseous abnormality identified. IMPRESSION: 1. Acute Diverticulitis of the mid sigmoid colon in the pelvis. No associated abscess or other complicating features. Widespread underlying descending and sigmoid colon diverticula. 2. No other acute or inflammatory process identified in the non-contrast abdomen or pelvis. 3.  Aortic Atherosclerosis (ICD10-I70.0). Electronically Signed   By: Genevie Ann M.D.   On: 09/15/2021 11:24    Microbiology: Recent Results (from the past 240 hour(s))  Resp Panel by RT-PCR (Flu A&B, Covid) Nasopharyngeal Swab     Status: None   Collection Time: 09/15/21  3:26 PM   Specimen: Nasopharyngeal Swab; Nasopharyngeal(NP) swabs in vial transport medium  Result Value Ref Range Status   SARS Coronavirus 2 by RT PCR NEGATIVE NEGATIVE Final    Comment: (NOTE) SARS-CoV-2 target nucleic acids are NOT DETECTED.  The SARS-CoV-2 RNA is generally detectable in upper respiratory specimens during the acute phase of infection. The lowest concentration of SARS-CoV-2 viral copies this assay can detect is 138 copies/mL. A negative result does not preclude SARS-Cov-2 infection and should not be used as the sole basis for treatment or other patient management decisions. A negative result may occur with  improper specimen collection/handling, submission of specimen other than nasopharyngeal swab, presence of viral mutation(s) within the areas targeted by this assay, and inadequate number of viral copies(<138 copies/mL). A negative result must be combined with clinical observations, patient history, and epidemiological information. The expected result is Negative.  Fact Sheet for Patients:  EntrepreneurPulse.com.au  Fact Sheet for Healthcare Providers:  IncredibleEmployment.be  This test is no t yet approved or cleared by the [place] FDA and  has been authorized for detection and/or diagnosis of SARS-CoV-2 by FDA under an Emergency Use Authorization (EUA). This EUA will remain  in effect (meaning this test can be used) for the duration of the COVID-19 declaration under Section 564(b)(1) of the Act, 21 U.S.C.section 360bbb-3(b)(1), unless the authorization is terminated  or revoked sooner.       Influenza A by PCR NEGATIVE NEGATIVE Final   Influenza B  by PCR NEGATIVE NEGATIVE Final    Comment: (NOTE) The Xpert Xpress SARS-CoV-2/FLU/RSV plus assay is intended as an aid in the diagnosis of influenza from Nasopharyngeal swab specimens and should not be used as a sole basis for treatment. Nasal washings and aspirates are unacceptable for Xpert Xpress SARS-CoV-2/FLU/RSV testing.  Fact Sheet for Patients: EntrepreneurPulse.com.au  Fact Sheet for Healthcare Providers: IncredibleEmployment.be  This test is not yet approved or cleared by the [place] FDA and has been authorized for detection and/or diagnosis of SARS-CoV-2 by FDA under an Emergency Use Authorization (EUA). This EUA will remain in effect (meaning this test can be used) for the duration of the COVID-19 declaration under Section 564(b)(1) of the Act, 21 U.S.C. section 360bbb-3(b)(1), unless the authorization is terminated or revoked.  Performed at [facility], [address], [place], [place] [address]      Labs: Basic Metabolic Panel: Recent Labs  Lab 09/15/21 1039 09/16/21 0533 09/18/21 0434  NA 139 136 138  K 4.0 4.3 4.3  CL 104 102 101  CO2 28 27 32  GLUCOSE 115* 103* 107*  BUN 11 12 9   CREATININE 0.65 0.70 0.87  CALCIUM 9.1 8.9 9.2  MG  --   --  1.9   Liver Function Tests: No results for input(s): AST, ALT, ALKPHOS,  BILITOT, PROT, ALBUMIN in the last 168 hours. No results for input(s): LIPASE, AMYLASE in the last 168 hours. No results for input(s): AMMONIA in the last 168 hours. CBC: Recent Labs  Lab 09/15/21 1039 09/16/21 0533 09/18/21 0434  WBC 6.0 7.2 5.9  NEUTROABS 3.5  --   --   HGB 13.4 12.4 11.3*  HCT 41.3 39.2 36.0  MCV 94.1 95.8 96.0  PLT 412* 386 363   Cardiac Enzymes: No results for input(s): CKTOTAL, CKMB, CKMBINDEX, TROPONINI in the last 168 hours. BNP: Invalid input(s): POCBNP CBG: Recent Labs  Lab 09/16/21 0720 09/16/21 1123  GLUCAP 91 100*    Time coordinating discharge:  36  minutes  Signed:  Orson Eva, DO [facility] Pager: [phone] 09/18/2021, 11:56 AM

## 2021-09-30 ENCOUNTER — Telehealth: Payer: Self-pay | Admitting: Cardiology

## 2021-09-30 NOTE — Telephone Encounter (Signed)
   [facility] Pre-operative Risk Assessment    Patient Name: [name]  DOB: 1956-03-07 MRN: [id number]  HEARTCARE STAFF:  - IMPORTANT!!!!!! Under Visit Info/Reason for Call, type in Other and utilize the format Clearance MM/DD/YY or Clearance TBD. Do not use dashes or single digits. - Please review there is not already an duplicate clearance open for this procedure. - If request is for dental extraction, please clarify the # of teeth to be extracted. - If the patient is currently at the dentist's office, call Pre-Op Callback Staff (MA/nurse) to input urgent request.  - If the patient is not currently in the dentist office, please route to the Pre-Op pool.  Request for surgical clearance:  What type of surgery is being performed? Rt Total Knee Replacement   When is this surgery scheduled? TBD  What type of clearance is required (medical clearance vs. Pharmacy clearance to hold med vs. Both)? both  Are there any medications that need to be held prior to surgery and how long? Please advise  Practice name and name of physician performing surgery? [facility]  What is the office phone number? [phone]   7.   What is the office fax number? [phone]  8.   Anesthesia type (None, local, MAC, general) ? Non Specified    Tildon Husky 09/30/2021, 2:44 PM  _________________________________________________________________   (provider comments below)

## 2021-10-03 ENCOUNTER — Other Ambulatory Visit (INDEPENDENT_AMBULATORY_CARE_PROVIDER_SITE_OTHER): Payer: Self-pay

## 2021-10-03 ENCOUNTER — Telehealth (INDEPENDENT_AMBULATORY_CARE_PROVIDER_SITE_OTHER): Payer: Self-pay

## 2021-10-03 ENCOUNTER — Ambulatory Visit (INDEPENDENT_AMBULATORY_CARE_PROVIDER_SITE_OTHER): Payer: BC Managed Care – PPO | Admitting: Gastroenterology

## 2021-10-03 ENCOUNTER — Encounter (INDEPENDENT_AMBULATORY_CARE_PROVIDER_SITE_OTHER): Payer: Self-pay | Admitting: Gastroenterology

## 2021-10-03 ENCOUNTER — Encounter (INDEPENDENT_AMBULATORY_CARE_PROVIDER_SITE_OTHER): Payer: Self-pay

## 2021-10-03 ENCOUNTER — Other Ambulatory Visit: Payer: Self-pay

## 2021-10-03 VITALS — BP 137/82 | HR 105 | Temp 99.2°F | Ht 62.0 in | Wt 202.7 lb

## 2021-10-03 DIAGNOSIS — K589 Irritable bowel syndrome without diarrhea: Secondary | ICD-10-CM | POA: Insufficient documentation

## 2021-10-03 DIAGNOSIS — K5792 Diverticulitis of intestine, part unspecified, without perforation or abscess without bleeding: Secondary | ICD-10-CM

## 2021-10-03 DIAGNOSIS — K581 Irritable bowel syndrome with constipation: Secondary | ICD-10-CM

## 2021-10-03 DIAGNOSIS — Z800 Family history of malignant neoplasm of digestive organs: Secondary | ICD-10-CM

## 2021-10-03 DIAGNOSIS — Z86010 Personal history of colonic polyps: Secondary | ICD-10-CM

## 2021-10-03 MED ORDER — DICYCLOMINE HCL 10 MG PO CAPS: 10.0000 mg | ORAL_CAPSULE | Freq: Two times a day (BID) | ORAL | 0 refills | Status: DC | PRN

## 2021-10-03 MED ORDER — PEG 3350-KCL-NA BICARB-NACL 420 G PO SOLR: 4000.0000 mL | ORAL | 0 refills | Status: DC

## 2021-10-03 NOTE — H&P (View-Only) (Signed)
Maylon Peppers, M.D. Gastroenterology & Hepatology [facility] [address] [place], [place] [address]  Primary Care Physician: Lanelle Bal, PA-C [place] [address]  I will communicate my assessment and recommendations to the referring MD via EMR.  Problems: IBS-C Acute diverticulitis Family history of colon cancer  History of Present Illness: [name] is a 65 y.o. female with PMH alpha gal sensitivity, anxiety, coronary artery disease status post stent placement, carotid stenosis, hyperlipidemia, nephrolithiasis, GERD, hypertension and IBS, who presents for follow up of abdominal pain.  The patient was last seen on 06/14/2020. At that time, the patient was presenting dysphagia.  She was on PPI twice a day (Protonix 40 mg).  She was scheduled to undergo an EGD for evaluation of the symptoms, as well as colonoscopy for family history of colon cancer.  However, the patient canceled her procedures and did not reschedule.  The patient reported she was having worsening abdominal pain in the lower abdomen in mid October. Due to the severity of her pain, she came to the ER at [facility], the patient was hospitalized on 09/15/2021 after presenting an episode of right-sided abdominal pain and right flank pain.  Underwent a CT of the abdomen and pelvis without IV contrast which showed presence of acute diverticulitis in the sigmoid: There was no presence of any other alteration.  The patient was started on Zosyn and was discharged on Augmentin for 7 days.  Notably, the patient was having worsening constipation at that time and it was attributed to use of opiates while hospitalized (oxycodone), she is currently on MiraLAX every day. She states that she moves her bowels at least 3 times per day.  Patient reports that she is not having pain in her abdomen anymore.  However, she has presented recurrent episodes of abdominal pain for the last  3 years. She reports that she has recurrent episodes of pain in her RUQ. States that the pain usually happens when she moves abruptly.States that pantoprazole 40 mg every 12 hours.  States she was started on hydrocodone recently by her orthopedics doctor.  Was on opiates in the past but the last time she took them chronically was 7 years ago. She also reports having a history of constipation for the last 5 years. She was taking OTC laxatives as needed for constipation and probiotics.   Occasionally has red blood in her stool when she has to strain to have a bowel movement. Is having these symptoms on a daily basis as she has to strain sometimes.  The patient denies having any nausea, vomiting, fever, chills, melena, hematemesis, abdominal distention, abdominalpain, diarrhea, jaundice, pruritus or weight loss.  Last EGD:2017 Normal esophagus.                           - Z-line irregular, 31 cm from the incisors.                           - 4 cm hiatal hernia.                           - Gastritis. Biopsied.                           - Normal second portion of the duodenum. Biopsied.  Last colonoscopy: 2015  Prep excellent. Small cecal polyp located across from ileocecal valve. It was ablated via cold biopsy. Single small diverticulum noted at hepatic flexure with few more at sigmoid colon. Normal rectal mucosa. Small hemorrhoids below the dentate line   Patient had small cecal polyp removed and it is sessile serrated polyp. 5 year repeat   Past Medical History: Past Medical History:  Diagnosis Date   Acute ST elevation myocardial infarction (STEMI) of inferior wall ([place]) 2012   Anxiety    CAD (coronary artery disease)    DES to the proximal and distal RCA April 2012 in the setting of inferior STEMI; occluded mid to distal RCA with left-to-right collaterals 2018, managed medically   Carotid stenosis    Less than 50% 8/11   Essential hypertension    GERD (gastroesophageal reflux  disease) 03/02/2014   H/O hiatal hernia    Hyperlipidemia    Nephrolithiasis    Urticaria     Past Surgical History: Past Surgical History:  Procedure Laterality Date   CARDIAC SURGERY     stent placement   CHOLECYSTECTOMY     COLONOSCOPY N/A 04/01/2014   Procedure: COLONOSCOPY;  Surgeon: Rogene Houston, MD;  [facility];  Service: Endoscopy;  Laterality: N/A;  100   Cyst removed from spine     ECTOPIC PREGNANCY SURGERY     ESOPHAGOGASTRODUODENOSCOPY N/A 04/01/2014   Procedure: ESOPHAGOGASTRODUODENOSCOPY (EGD);  Surgeon: Rogene Houston, MD;  [facility];  Service: Endoscopy;  Laterality: N/A;  100   ESOPHAGOGASTRODUODENOSCOPY N/A 11/03/2016   Procedure: ESOPHAGOGASTRODUODENOSCOPY (EGD);  Surgeon: Rogene Houston, MD;  [facility];  Service: Endoscopy;  Laterality: N/A;  2:45   Heart stent     2012  x 2 stents   LEFT HEART CATH AND CORONARY ANGIOGRAPHY N/A 07/03/2017   Procedure: LEFT HEART CATH AND CORONARY ANGIOGRAPHY;  Surgeon: Burnell Blanks, MD;  Location: [place] [facility];  Service: Cardiovascular;  Laterality: N/A;   Left knee     Arthroscopic   Left wrist     TONSILLECTOMY AND ADENOIDECTOMY     TOTAL KNEE ARTHROPLASTY Left 09/26/2013   Procedure: TOTAL KNEE ARTHROPLASTY- LEFT;  Surgeon: [name]., MD;  Location: [place];  Service: Orthopedics;  Laterality: Left;   TUBAL LIGATION      Family History: Family History  Problem Relation Age of Onset   Colon cancer Father    Heart disease Mother    Heart disease Other    Arthritis Other    Cancer Other    Allergic rhinitis Neg Hx    Angioedema Neg Hx    Asthma Neg Hx    Atopy Neg Hx    Eczema Neg Hx    Immunodeficiency Neg Hx    Urticaria Neg Hx     Social History: Social History   Tobacco Use  Smoking Status Former   Packs/day: 1.00   Years: 10.00   Pack years: 10.00   Types: Cigarettes   Start date: 06/27/1979   Quit date: 11/27/2010   Years since quitting: 10.8   Smokeless Tobacco Never   Social History   Substance and Sexual Activity  Alcohol Use Yes   Alcohol/week: 0.0 standard drinks   Comment: social   Social History   Substance and Sexual Activity  Drug Use No    Allergies: Allergies  Allergen Reactions   Ancef [Cefazolin] Itching    Itching around mouth - sx resolved quickly after administration of benadryl;  with no further sx   Ciprofloxacin Hcl     unknown   Meat [Alpha-Gal] Diarrhea   Metaxalone     unknown   Morphine And Related Itching    Mild itching to head  and extremities, self resolved without intervention.    Sulfa Antibiotics Itching, Rash and Other (See Comments)    "severe muscle cramps, tongue cracked, dry mouth "    Medications: Current Outpatient Medications  Medication Sig Dispense Refill   acetaminophen (TYLENOL) 650 MG CR tablet Take 650 mg by mouth every 8 (eight) hours as needed for pain. Takes 2 tabs twice a day.     amLODipine (NORVASC) 5 MG tablet Take 5 mg by mouth daily.     aspirin 81 MG tablet Take 81 mg by mouth daily.     busPIRone (BUSPAR) 5 MG tablet Take 5 mg by mouth 2 (two) times daily as needed.     cyclobenzaprine (FLEXERIL) 5 MG tablet Take 1 tablet (5 mg total) by mouth 3 (three) times daily. 21 tablet 0   DULoxetine (CYMBALTA) 60 MG capsule Take 60 mg by mouth daily.  3   isosorbide mononitrate (IMDUR) 30 MG 24 hr tablet TAKE 1 TABLET(30 MG) BY MOUTH DAILY 90 tablet 1   LORazepam (ATIVAN) 0.5 MG tablet Take 0.5 mg by mouth daily as needed.     nitroGLYCERIN (NITROSTAT) 0.4 MG SL tablet Place 1 tablet (0.4 mg total) under the tongue every 5 (five) minutes as needed for chest pain. 25 tablet 3   oxyCODONE (OXY IR/ROXICODONE) 5 MG immediate release tablet Take 1 tablet (5 mg total) by mouth every 4 (four) hours as needed for moderate pain. 15 tablet 0   pantoprazole (PROTONIX) 40 MG tablet Take 1 tablet (40 mg total) by mouth 2 (two) times daily before a meal. 60 tablet 1    polyethylene glycol (MIRALAX / GLYCOLAX) 17 g packet Take 17 g by mouth daily. 14 each 0   REPATHA SURECLICK 037 MG/ML SOAJ ADMINISTER 1 ML UNDER THE SKIN EVERY 14 DAYS AS DIRECTED 6 mL 0   zolpidem (AMBIEN) 10 MG tablet Take 10 mg by mouth at bedtime.   3   losartan (COZAAR) 100 MG tablet Take 1 tablet (100 mg total) by mouth daily. 90 tablet 3   No current facility-administered medications for this visit.    Review of Systems: GENERAL: negative for malaise, night sweats HEENT: No changes in hearing or vision, no nose bleeds or other nasal problems. NECK: Negative for lumps, goiter, pain and significant neck swelling RESPIRATORY: Negative for cough, wheezing CARDIOVASCULAR: Negative for chest pain, leg swelling, palpitations, orthopnea GI: SEE HPI MUSCULOSKELETAL: Negative for joint pain or swelling, back pain, and muscle pain. SKIN: Negative for lesions, rash PSYCH: Negative for sleep disturbance, mood disorder and recent psychosocial stressors. HEMATOLOGY Negative for prolonged bleeding, bruising easily, and swollen nodes. ENDOCRINE: Negative for cold or heat intolerance, polyuria, polydipsia and goiter. NEURO: negative for tremor, gait imbalance, syncope and seizures. The remainder of the review of systems is noncontributory.   Physical Exam: BP 137/82 (BP Location: Left Arm, Patient Position: Sitting, Cuff Size: Large)   Pulse (!) 105   Temp 99.2 F (37.3 C) (Oral)   Ht 5\' 2"  (1.575 m)   Wt 202 lb 11.2 oz (91.9 kg)   BMI 37.07 kg/m  GENERAL: The patient is AO x3, in no acute distress. HEENT: Head is normocephalic and atraumatic. EOMI are intact. Mouth is well hydrated and without lesions. NECK:  Supple. No masses LUNGS: Clear to auscultation. No presence of rhonchi/wheezing/rales. Adequate chest expansion HEART: RRR, normal s1 and s2. ABDOMEN: mildly tender in the RUQ, no guarding, no peritoneal signs, and nondistended. BS +. No masses. EXTREMITIES: Without any cyanosis,  clubbing, rash, lesions or edema. NEUROLOGIC: AOx3, no focal motor deficit. SKIN: no jaundice, no rashes  Imaging/Labs: as above  I personally reviewed and interpreted the available labs, imaging and endoscopic files.  Impression and Plan: [name] is a 65 y.o. female with PMH alpha gal sensitivity, anxiety, coronary artery disease status post stent placement, carotid stenosis, hyperlipidemia, nephrolithiasis, GERD, hypertension and IBS, who presents for follow up of abdominal pain.  The patient has presented previous episodes of abdominal pain which were possibly related to her IBS.  This has been managed conservatively in the past.  Had presence of abdominal pain recently but it was likely secondary to an episode of diverticulitis which has resolved with antibiotic treatment.  Since she had some worsening of her constipation with the use of opiates, she will benefit from restarting the MiraLAX on a daily basis.  She can also take some Bentyl as needed for her abdominal pain in the right upper quadrant which has been chronic and likely functional.  Ideally she should try to minimize the use of opiates as this may increase her bowel hypersensitivity.  There are no contraindications to proceed with her knee surgery.    She will schedule her colonoscopy due to a history of colorectal cancer in her family and history of colon polyps.  - Continue Miralax daily - Start Bentyl 1 tablet q12h as needed for abdominal pain - Try to minimize opiate use - Please call us back when ready to schedule colonoscopy for family history of colon cancer and colon polyps - Patient is cleared from the gastrointestinal perspective to undergo knee surgery - schedule colonoscopy  All questions were answered.      Harvel Quale, MD Gastroenterology and Hepatology [facility]

## 2021-10-03 NOTE — Patient Instructions (Addendum)
Continue Miralax daily Start Bentyl 1 tablet q12h as needed for abdominal pain Try to minimize opiate use Please call us back when ready to schedule colonoscopy for family history of colon cancer and colon polyps You are cleared from the gastrointestinal perspective to undergo your knee surgery

## 2021-10-03 NOTE — Progress Notes (Signed)
Maylon Peppers, M.D. Gastroenterology & Hepatology [facility] [address] [place], [place] [address]  Primary Care Physician: Lanelle Bal, PA-C [place] [address]  I will communicate my assessment and recommendations to the referring MD via EMR.  Problems: IBS-C Acute diverticulitis Family history of colon cancer  History of Present Illness: [name] is a 65 y.o. female with PMH alpha gal sensitivity, anxiety, coronary artery disease status post stent placement, carotid stenosis, hyperlipidemia, nephrolithiasis, GERD, hypertension and IBS, who presents for follow up of abdominal pain.  The patient was last seen on 06/14/2020. At that time, the patient was presenting dysphagia.  She was on PPI twice a day (Protonix 40 mg).  She was scheduled to undergo an EGD for evaluation of the symptoms, as well as colonoscopy for family history of colon cancer.  However, the patient canceled her procedures and did not reschedule.  The patient reported she was having worsening abdominal pain in the lower abdomen in mid October. Due to the severity of her pain, she came to the ER at [facility], the patient was hospitalized on 09/15/2021 after presenting an episode of right-sided abdominal pain and right flank pain.  Underwent a CT of the abdomen and pelvis without IV contrast which showed presence of acute diverticulitis in the sigmoid: There was no presence of any other alteration.  The patient was started on Zosyn and was discharged on Augmentin for 7 days.  Notably, the patient was having worsening constipation at that time and it was attributed to use of opiates while hospitalized (oxycodone), she is currently on MiraLAX every day. She states that she moves her bowels at least 3 times per day.  Patient reports that she is not having pain in her abdomen anymore.  However, she has presented recurrent episodes of abdominal pain for the last  3 years. She reports that she has recurrent episodes of pain in her RUQ. States that the pain usually happens when she moves abruptly.States that pantoprazole 40 mg every 12 hours.  States she was started on hydrocodone recently by her orthopedics doctor.  Was on opiates in the past but the last time she took them chronically was 7 years ago. She also reports having a history of constipation for the last 5 years. She was taking OTC laxatives as needed for constipation and probiotics.   Occasionally has red blood in her stool when she has to strain to have a bowel movement. Is having these symptoms on a daily basis as she has to strain sometimes.  The patient denies having any nausea, vomiting, fever, chills, melena, hematemesis, abdominal distention, abdominalpain, diarrhea, jaundice, pruritus or weight loss.  Last EGD:2017 Normal esophagus.                           - Z-line irregular, 31 cm from the incisors.                           - 4 cm hiatal hernia.                           - Gastritis. Biopsied.                           - Normal second portion of the duodenum. Biopsied.  Last colonoscopy: 2015  Prep excellent. Small cecal polyp located across from ileocecal valve. It was ablated via cold biopsy. Single small diverticulum noted at hepatic flexure with few more at sigmoid colon. Normal rectal mucosa. Small hemorrhoids below the dentate line   Patient had small cecal polyp removed and it is sessile serrated polyp. 5 year repeat   Past Medical History: Past Medical History:  Diagnosis Date   Acute ST elevation myocardial infarction (STEMI) of inferior wall ([place]) 2012   Anxiety    CAD (coronary artery disease)    DES to the proximal and distal RCA April 2012 in the setting of inferior STEMI; occluded mid to distal RCA with left-to-right collaterals 2018, managed medically   Carotid stenosis    Less than 50% 8/11   Essential hypertension    GERD (gastroesophageal reflux  disease) 03/02/2014   H/O hiatal hernia    Hyperlipidemia    Nephrolithiasis    Urticaria     Past Surgical History: Past Surgical History:  Procedure Laterality Date   CARDIAC SURGERY     stent placement   CHOLECYSTECTOMY     COLONOSCOPY N/A 04/01/2014   Procedure: COLONOSCOPY;  Surgeon: Rogene Houston, MD;  [facility];  Service: Endoscopy;  Laterality: N/A;  100   Cyst removed from spine     ECTOPIC PREGNANCY SURGERY     ESOPHAGOGASTRODUODENOSCOPY N/A 04/01/2014   Procedure: ESOPHAGOGASTRODUODENOSCOPY (EGD);  Surgeon: Rogene Houston, MD;  [facility];  Service: Endoscopy;  Laterality: N/A;  100   ESOPHAGOGASTRODUODENOSCOPY N/A 11/03/2016   Procedure: ESOPHAGOGASTRODUODENOSCOPY (EGD);  Surgeon: Rogene Houston, MD;  [facility];  Service: Endoscopy;  Laterality: N/A;  2:45   Heart stent     2012  x 2 stents   LEFT HEART CATH AND CORONARY ANGIOGRAPHY N/A 07/03/2017   Procedure: LEFT HEART CATH AND CORONARY ANGIOGRAPHY;  Surgeon: Burnell Blanks, MD;  Location: [place] [facility];  Service: Cardiovascular;  Laterality: N/A;   Left knee     Arthroscopic   Left wrist     TONSILLECTOMY AND ADENOIDECTOMY     TOTAL KNEE ARTHROPLASTY Left 09/26/2013   Procedure: TOTAL KNEE ARTHROPLASTY- LEFT;  Surgeon: [name]., MD;  Location: [facility];  Service: Orthopedics;  Laterality: Left;   TUBAL LIGATION      Family History: Family History  Problem Relation Age of Onset   Colon cancer Father    Heart disease Mother    Heart disease Other    Arthritis Other    Cancer Other    Allergic rhinitis Neg Hx    Angioedema Neg Hx    Asthma Neg Hx    Atopy Neg Hx    Eczema Neg Hx    Immunodeficiency Neg Hx    Urticaria Neg Hx     Social History: Social History   Tobacco Use  Smoking Status Former   Packs/day: 1.00   Years: 10.00   Pack years: 10.00   Types: Cigarettes   Start date: 06/27/1979   Quit date: 11/27/2010   Years since quitting: 10.8   Smokeless Tobacco Never   Social History   Substance and Sexual Activity  Alcohol Use Yes   Alcohol/week: 0.0 standard drinks   Comment: social   Social History   Substance and Sexual Activity  Drug Use No    Allergies: Allergies  Allergen Reactions   Ancef [Cefazolin] Itching    Itching around mouth - sx resolved quickly after administration of benadryl;  with no further sx   Ciprofloxacin Hcl     unknown   Meat [Alpha-Gal] Diarrhea   Metaxalone     unknown   Morphine And Related Itching    Mild itching to head  and extremities, self resolved without intervention.    Sulfa Antibiotics Itching, Rash and Other (See Comments)    "severe muscle cramps, tongue cracked, dry mouth "    Medications: Current Outpatient Medications  Medication Sig Dispense Refill   acetaminophen (TYLENOL) 650 MG CR tablet Take 650 mg by mouth every 8 (eight) hours as needed for pain. Takes 2 tabs twice a day.     amLODipine (NORVASC) 5 MG tablet Take 5 mg by mouth daily.     aspirin 81 MG tablet Take 81 mg by mouth daily.     busPIRone (BUSPAR) 5 MG tablet Take 5 mg by mouth 2 (two) times daily as needed.     cyclobenzaprine (FLEXERIL) 5 MG tablet Take 1 tablet (5 mg total) by mouth 3 (three) times daily. 21 tablet 0   DULoxetine (CYMBALTA) 60 MG capsule Take 60 mg by mouth daily.  3   isosorbide mononitrate (IMDUR) 30 MG 24 hr tablet TAKE 1 TABLET(30 MG) BY MOUTH DAILY 90 tablet 1   LORazepam (ATIVAN) 0.5 MG tablet Take 0.5 mg by mouth daily as needed.     nitroGLYCERIN (NITROSTAT) 0.4 MG SL tablet Place 1 tablet (0.4 mg total) under the tongue every 5 (five) minutes as needed for chest pain. 25 tablet 3   oxyCODONE (OXY IR/ROXICODONE) 5 MG immediate release tablet Take 1 tablet (5 mg total) by mouth every 4 (four) hours as needed for moderate pain. 15 tablet 0   pantoprazole (PROTONIX) 40 MG tablet Take 1 tablet (40 mg total) by mouth 2 (two) times daily before a meal. 60 tablet 1    polyethylene glycol (MIRALAX / GLYCOLAX) 17 g packet Take 17 g by mouth daily. 14 each 0   REPATHA SURECLICK 275 MG/ML SOAJ ADMINISTER 1 ML UNDER THE SKIN EVERY 14 DAYS AS DIRECTED 6 mL 0   zolpidem (AMBIEN) 10 MG tablet Take 10 mg by mouth at bedtime.   3   losartan (COZAAR) 100 MG tablet Take 1 tablet (100 mg total) by mouth daily. 90 tablet 3   No current facility-administered medications for this visit.    Review of Systems: GENERAL: negative for malaise, night sweats HEENT: No changes in hearing or vision, no nose bleeds or other nasal problems. NECK: Negative for lumps, goiter, pain and significant neck swelling RESPIRATORY: Negative for cough, wheezing CARDIOVASCULAR: Negative for chest pain, leg swelling, palpitations, orthopnea GI: SEE HPI MUSCULOSKELETAL: Negative for joint pain or swelling, back pain, and muscle pain. SKIN: Negative for lesions, rash PSYCH: Negative for sleep disturbance, mood disorder and recent psychosocial stressors. HEMATOLOGY Negative for prolonged bleeding, bruising easily, and swollen nodes. ENDOCRINE: Negative for cold or heat intolerance, polyuria, polydipsia and goiter. NEURO: negative for tremor, gait imbalance, syncope and seizures. The remainder of the review of systems is noncontributory.   Physical Exam: BP 137/82 (BP Location: Left Arm, Patient Position: Sitting, Cuff Size: Large)   Pulse (!) 105   Temp 99.2 F (37.3 C) (Oral)   Ht 5\' 2"  (1.575 m)   Wt 202 lb 11.2 oz (91.9 kg)   BMI 37.07 kg/m  GENERAL: The patient is AO x3, in no acute distress. HEENT: Head is normocephalic and atraumatic. EOMI are intact. Mouth is well hydrated and without lesions. NECK:  Supple. No masses LUNGS: Clear to auscultation. No presence of rhonchi/wheezing/rales. Adequate chest expansion HEART: RRR, normal s1 and s2. ABDOMEN: mildly tender in the RUQ, no guarding, no peritoneal signs, and nondistended. BS +. No masses. EXTREMITIES: Without any cyanosis,  clubbing, rash, lesions or edema. NEUROLOGIC: AOx3, no focal motor deficit. SKIN: no jaundice, no rashes  Imaging/Labs: as above  I personally reviewed and interpreted the available labs, imaging and endoscopic files.  Impression and Plan: Cloie L Foskett is a 65 y.o. female with PMH alpha gal sensitivity, anxiety, coronary artery disease status post stent placement, carotid stenosis, hyperlipidemia, nephrolithiasis, GERD, hypertension and IBS, who presents for follow up of abdominal pain.  The patient has presented previous episodes of abdominal pain which were possibly related to her IBS.  This has been managed conservatively in the past.  Had presence of abdominal pain recently but it was likely secondary to an episode of diverticulitis which has resolved with antibiotic treatment.  Since she had some worsening of her constipation with the use of opiates, she will benefit from restarting the MiraLAX on a daily basis.  She can also take some Bentyl as needed for her abdominal pain in the right upper quadrant which has been chronic and likely functional.  Ideally she should try to minimize the use of opiates as this may increase her bowel hypersensitivity.  There are no contraindications to proceed with her knee surgery.    She will schedule her colonoscopy due to a history of colorectal cancer in her family and history of colon polyps.  - Continue Miralax daily - Start Bentyl 1 tablet q12h as needed for abdominal pain - Try to minimize opiate use - Please call us back when ready to schedule colonoscopy for family history of colon cancer and colon polyps - Patient is cleared from the gastrointestinal perspective to undergo knee surgery - schedule colonoscopy  All questions were answered.      Harvel Quale, MD Gastroenterology and Hepatology [facility]

## 2021-10-03 NOTE — Telephone Encounter (Signed)
[name], CMA  

## 2021-10-04 NOTE — Telephone Encounter (Signed)
Dr. Domenic Polite to review, patient had history of inferior STEMI in 2012.  Most recent cardiac catheterization in 2018 revealed occluded mid to distal RCA with left-to-right collaterals, otherwise nonobstructive disease.  She was managed medically.  Patient was last seen by Dr. Domenic Polite in May 2022.  She was recently admitted to the hospital with acute diverticulitis and has upcoming colonoscopy procedure scheduled later this month.  After that, she will need to have knee surgery.  Talking with the patient, she denies any recent chest pain or worsening dyspnea.  Unfortunately she is unable to accomplish more than 4 METS of activity with significant limitation from her knee.  She says she has constant ache from her knee.  Dr. Domenic Polite, please review, do recommend additional work-up for this patient or if she cleared from your perspective to proceed with knee surgery?  Only antiplatelet agent she is currently taking is the 81 mg aspirin.  Please forward your response to [facility] PREOP

## 2021-10-04 NOTE — Telephone Encounter (Signed)
    Patient Name: [name]  DOB: 13-Aug-1956 MRN: [id number]  Primary Cardiologist: Rozann Lesches, MD  Chart reviewed as part of pre-operative protocol coverage. Given past medical history and time since last visit, based on ACC/AHA guidelines, [name] would be at acceptable risk for the planned procedure without further cardiovascular testing.   I discussed the case with Dr. Domenic Polite, per Dr. Domenic Polite, "I think if she has been clinically stable from a cardiac perspective on medical therapy, we probably do not gain very much by putting her through a Myoview.  Noninvasive imaging would almost certainly be abnormal in light of her known coronary anatomy."   The patient was advised that if she develops new symptoms prior to surgery to contact our office to arrange for a follow-up visit, and she verbalized understanding.  I will route this recommendation to the requesting party via Epic fax function and remove from pre-op pool.  Please call with questions.  [place], [place] 10/04/2021, 1:37 PM

## 2021-10-13 ENCOUNTER — Other Ambulatory Visit (HOSPITAL_COMMUNITY): Payer: BC Managed Care – PPO

## 2021-10-13 NOTE — Patient Instructions (Signed)
   Your procedure is scheduled on: 10/18/2021  Report to [facility] at   7:00  AM.  Call this number if you have problems the morning of surgery: [phone]   Remember:              Follow Directions on the letter you received from Your Physician's office regarding the Bowel Prep              No Smoking the day of Procedure :   Take these medicines the morning of surgery with A SIP OF WATER: Amlodipine, buspar, Isosorbide, pantoprazole, and cymbalta   Hydrocodone and/or fexeril if needed    Do not wear jewelry, make-up or nail polish.    Do not bring valuables to the hospital.  Contacts, dentures or bridgework may not be worn into surgery.  .   Patients discharged the day of surgery will not be allowed to drive home.     Colonoscopy, Adult, Care After This sheet gives you information about how to care for yourself after your procedure. Your health care provider may also give you more specific instructions. If you have problems or questions, contact your health care provider. What can I expect after the procedure? After the procedure, it is common to have: A small amount of blood in your stool for 24 hours after the procedure. Some gas. Mild abdominal cramping or bloating.  Follow these instructions at home: General instructions  For the first 24 hours after the procedure: Do not drive or use machinery. Do not sign important documents. Do not drink alcohol. Do your regular daily activities at a slower pace than normal. Eat soft, easy-to-digest foods. Rest often. Take over-the-counter or prescription medicines only as told by your health care provider. It is up to you to get the results of your procedure. Ask your health care provider, or the department performing the procedure, when your results will be ready. Relieving cramping and bloating Try walking around when you have cramps or feel bloated. Apply heat to your abdomen as told by your health care  provider. Use a heat source that your health care provider recommends, such as a moist heat pack or a heating pad. Place a towel between your skin and the heat source. Leave the heat on for 20-30 minutes. Remove the heat if your skin turns bright red. This is especially important if you are unable to feel pain, heat, or cold. You may have a greater risk of getting burned. Eating and drinking Drink enough fluid to keep your urine clear or pale yellow. Resume your normal diet as instructed by your health care provider. Avoid heavy or fried foods that are hard to digest. Avoid drinking alcohol for as long as instructed by your health care provider. Contact a health care provider if: You have blood in your stool 2-3 days after the procedure. Get help right away if: You have more than a small spotting of blood in your stool. You pass large blood clots in your stool. Your abdomen is swollen. You have nausea or vomiting. You have a fever. You have increasing abdominal pain that is not relieved with medicine. This information is not intended to replace advice given to you by your health care provider. Make sure you discuss any questions you have with your health care provider. Document Released: 06/27/2004 Document Revised: 08/07/2016 Document Reviewed: 01/25/2016 Elsevier Interactive Patient Education  [employer].

## 2021-10-14 ENCOUNTER — Encounter (HOSPITAL_COMMUNITY): Payer: Self-pay

## 2021-10-14 ENCOUNTER — Encounter (HOSPITAL_COMMUNITY): Payer: BC Managed Care – PPO

## 2021-10-14 ENCOUNTER — Other Ambulatory Visit (INDEPENDENT_AMBULATORY_CARE_PROVIDER_SITE_OTHER): Payer: Self-pay

## 2021-10-14 ENCOUNTER — Other Ambulatory Visit: Payer: Self-pay

## 2021-10-14 ENCOUNTER — Encounter (HOSPITAL_COMMUNITY)
Admission: RE | Admit: 2021-10-14 | Discharge: 2021-10-14 | Disposition: A | Discharge status: Home / Self Care | Payer: BC Managed Care – PPO | Source: Ambulatory Visit | Attending: Gastroenterology | Admitting: Gastroenterology

## 2021-10-14 DIAGNOSIS — Z86010 Personal history of colonic polyps: Secondary | ICD-10-CM

## 2021-10-14 DIAGNOSIS — Z800 Family history of malignant neoplasm of digestive organs: Secondary | ICD-10-CM

## 2021-10-14 DIAGNOSIS — R1319 Other dysphagia: Secondary | ICD-10-CM

## 2021-10-14 HISTORY — DX: Fibromyalgia: M79.7

## 2021-10-14 HISTORY — DX: Diverticulitis of intestine, part unspecified, without perforation or abscess without bleeding: K57.92

## 2021-10-14 HISTORY — DX: Depression, unspecified: F32.A

## 2021-10-14 HISTORY — DX: Unspecified osteoarthritis, unspecified site: M19.90

## 2021-10-14 HISTORY — DX: Diverticulosis of intestine, part unspecified, without perforation or abscess without bleeding: K57.90

## 2021-10-14 HISTORY — DX: Personal history of urinary calculi: Z87.442

## 2021-10-14 HISTORY — DX: Other chronic pain: G89.29

## 2021-10-14 HISTORY — DX: Low back pain, unspecified: M54.50

## 2021-10-18 ENCOUNTER — Ambulatory Visit (HOSPITAL_COMMUNITY): Payer: BC Managed Care – PPO | Admitting: Anesthesiology

## 2021-10-18 ENCOUNTER — Encounter (HOSPITAL_COMMUNITY): Payer: Self-pay | Admitting: Gastroenterology

## 2021-10-18 ENCOUNTER — Other Ambulatory Visit: Payer: Self-pay

## 2021-10-18 ENCOUNTER — Ambulatory Visit (HOSPITAL_COMMUNITY)
Admission: RE | Admit: 2021-10-18 | Discharge: 2021-10-18 | Disposition: A | Discharge status: Home / Self Care | Payer: BC Managed Care – PPO | Attending: Gastroenterology | Admitting: Gastroenterology

## 2021-10-18 ENCOUNTER — Encounter (HOSPITAL_COMMUNITY)
Admission: RE | Disposition: A | Discharge status: Home / Self Care | Payer: Self-pay | Source: Home / Self Care | Attending: Gastroenterology

## 2021-10-18 DIAGNOSIS — Z87442 Personal history of urinary calculi: Secondary | ICD-10-CM | POA: Insufficient documentation

## 2021-10-18 DIAGNOSIS — F419 Anxiety disorder, unspecified: Secondary | ICD-10-CM | POA: Diagnosis not present

## 2021-10-18 DIAGNOSIS — Z86010 Personal history of colonic polyps: Secondary | ICD-10-CM

## 2021-10-18 DIAGNOSIS — Z79899 Other long term (current) drug therapy: Secondary | ICD-10-CM | POA: Insufficient documentation

## 2021-10-18 DIAGNOSIS — E785 Hyperlipidemia, unspecified: Secondary | ICD-10-CM | POA: Insufficient documentation

## 2021-10-18 DIAGNOSIS — Z85038 Personal history of other malignant neoplasm of large intestine: Secondary | ICD-10-CM | POA: Insufficient documentation

## 2021-10-18 DIAGNOSIS — Z955 Presence of coronary angioplasty implant and graft: Secondary | ICD-10-CM | POA: Diagnosis not present

## 2021-10-18 DIAGNOSIS — D120 Benign neoplasm of cecum: Secondary | ICD-10-CM | POA: Insufficient documentation

## 2021-10-18 DIAGNOSIS — K5730 Diverticulosis of large intestine without perforation or abscess without bleeding: Secondary | ICD-10-CM | POA: Insufficient documentation

## 2021-10-18 DIAGNOSIS — Z1211 Encounter for screening for malignant neoplasm of colon: Secondary | ICD-10-CM | POA: Insufficient documentation

## 2021-10-18 DIAGNOSIS — I2510 Atherosclerotic heart disease of native coronary artery without angina pectoris: Secondary | ICD-10-CM | POA: Insufficient documentation

## 2021-10-18 DIAGNOSIS — K219 Gastro-esophageal reflux disease without esophagitis: Secondary | ICD-10-CM | POA: Diagnosis not present

## 2021-10-18 DIAGNOSIS — K581 Irritable bowel syndrome with constipation: Secondary | ICD-10-CM | POA: Diagnosis not present

## 2021-10-18 DIAGNOSIS — R1310 Dysphagia, unspecified: Secondary | ICD-10-CM

## 2021-10-18 DIAGNOSIS — Z800 Family history of malignant neoplasm of digestive organs: Secondary | ICD-10-CM | POA: Diagnosis not present

## 2021-10-18 DIAGNOSIS — R1319 Other dysphagia: Secondary | ICD-10-CM

## 2021-10-18 DIAGNOSIS — I10 Essential (primary) hypertension: Secondary | ICD-10-CM | POA: Insufficient documentation

## 2021-10-18 DIAGNOSIS — K6389 Other specified diseases of intestine: Secondary | ICD-10-CM

## 2021-10-18 DIAGNOSIS — K648 Other hemorrhoids: Secondary | ICD-10-CM

## 2021-10-18 DIAGNOSIS — K449 Diaphragmatic hernia without obstruction or gangrene: Secondary | ICD-10-CM | POA: Diagnosis not present

## 2021-10-18 DIAGNOSIS — Z87891 Personal history of nicotine dependence: Secondary | ICD-10-CM | POA: Diagnosis not present

## 2021-10-18 HISTORY — PX: POLYPECTOMY: SHX5525

## 2021-10-18 HISTORY — PX: SAVORY DILATION: SHX5439

## 2021-10-18 HISTORY — PX: BIOPSY: SHX5522

## 2021-10-18 HISTORY — PX: ESOPHAGOGASTRODUODENOSCOPY (EGD) WITH PROPOFOL: SHX5813

## 2021-10-18 HISTORY — PX: COLONOSCOPY WITH PROPOFOL: SHX5780

## 2021-10-18 LAB — HM COLONOSCOPY

## 2021-10-18 SURGERY — COLONOSCOPY WITH PROPOFOL
Anesthesia: General

## 2021-10-18 MED ORDER — PROPOFOL 10 MG/ML IV BOLUS
INTRAVENOUS | Status: DC | PRN
  Administered 2021-10-18 (×3): 50 mg via INTRAVENOUS
  Administered 2021-10-18: 20 mg via INTRAVENOUS
  Administered 2021-10-18: 50 mg via INTRAVENOUS
  Administered 2021-10-18: 40 mg via INTRAVENOUS
  Administered 2021-10-18 (×2): 50 mg via INTRAVENOUS
  Administered 2021-10-18 (×2): 40 mg via INTRAVENOUS
  Administered 2021-10-18: 30 mg via INTRAVENOUS
  Administered 2021-10-18: 40 mg via INTRAVENOUS
  Administered 2021-10-18: 70 mg via INTRAVENOUS

## 2021-10-18 MED ORDER — LACTATED RINGERS IV SOLN: INTRAVENOUS | Status: DC

## 2021-10-18 NOTE — Anesthesia Postprocedure Evaluation (Signed)
Anesthesia Post Note  Patient: [name]  Procedure(s) Performed: COLONOSCOPY WITH PROPOFOL ESOPHAGOGASTRODUODENOSCOPY (EGD) WITH PROPOFOL SAVORY DILATION BIOPSY POLYPECTOMY  Patient location during evaluation: Phase II Anesthesia Type: General Level of consciousness: awake and alert and oriented Pain management: pain level controlled Vital Signs Assessment: post-procedure vital signs reviewed and stable Respiratory status: spontaneous breathing, nonlabored ventilation and respiratory function stable Cardiovascular status: blood pressure returned to baseline and stable Postop Assessment: no apparent nausea or vomiting Anesthetic complications: no   No notable events documented.   Last Vitals:  Vitals:   10/18/21 0717 10/18/21 0938  BP: (!) 137/94 110/72  Pulse: 94 83  Resp: 16 18  Temp: 37.1 C (!) 36.4 C  SpO2: 96% 98%    Last Pain:  Vitals:   10/18/21 0938  TempSrc: Oral  PainSc: 0-No pain                 Mariem Skolnick C Zeriyah Wain

## 2021-10-18 NOTE — Transfer of Care (Signed)
Immediate Anesthesia Transfer of Care Note  Patient: [name]  Procedure(s) Performed: COLONOSCOPY WITH PROPOFOL ESOPHAGOGASTRODUODENOSCOPY (EGD) WITH PROPOFOL SAVORY DILATION BIOPSY POLYPECTOMY  Patient Location: PACU  Anesthesia Type:General  Level of Consciousness: awake, alert , oriented and patient cooperative  Airway & Oxygen Therapy: Patient Spontanous Breathing and Patient connected to nasal cannula oxygen  Post-op Assessment: Report given to RN, Post -op Vital signs reviewed and stable and Patient moving all extremities X 4  Post vital signs: Reviewed and stable  Last Vitals:  Vitals Value Taken Time  BP    Temp    Pulse    Resp    SpO2      Last Pain:  Vitals:   10/18/21 0837  TempSrc:   PainSc: 0-No pain         Complications: No notable events documented.

## 2021-10-18 NOTE — Anesthesia Preprocedure Evaluation (Addendum)
Anesthesia Evaluation  Patient identified by MRN, date of birth, ID band Patient awake    Reviewed: Allergy & Precautions, H&P , NPO status , Patient's Chart, lab work & pertinent test results  Airway Mallampati: III  TM Distance: >3 FB Neck ROM: Full    Dental  (+) Dental Advisory Given, Teeth Intact   Pulmonary sleep apnea (heavy snoring) , former smoker,    Pulmonary exam normal breath sounds clear to auscultation       Cardiovascular hypertension, + CAD, + Past MI and + Cardiac Stents  Normal cardiovascular exam Rhythm:Regular Rate:Normal     Neuro/Psych PSYCHIATRIC DISORDERS Anxiety Depression  Neuromuscular disease    GI/Hepatic Neg liver ROS, hiatal hernia, GERD  Medicated,  Endo/Other  negative endocrine ROS  Renal/GU negative Renal ROS  negative genitourinary   Musculoskeletal  (+) Arthritis , Osteoarthritis,  Fibromyalgia -, narcotic dependent  Abdominal   Peds negative pediatric ROS (+)  Hematology negative hematology ROS (+)   Anesthesia Other Findings   Reproductive/Obstetrics negative OB ROS                          Anesthesia Physical Anesthesia Plan  ASA: 3  Anesthesia Plan: General   Post-op Pain Management: Minimal or no pain anticipated   Induction: Intravenous  PONV Risk Score and Plan: TIVA  Airway Management Planned: Nasal Cannula and Natural Airway  Additional Equipment:   Intra-op Plan:   Post-operative Plan:   Informed Consent: I have reviewed the patients History and Physical, chart, labs and discussed the procedure including the risks, benefits and alternatives for the proposed anesthesia with the patient or authorized representative who has indicated his/her understanding and acceptance.     Dental advisory given  Plan Discussed with: CRNA and Surgeon  Anesthesia Plan Comments:         Anesthesia Quick Evaluation

## 2021-10-18 NOTE — Interval H&P Note (Signed)
History and Physical Interval Note:  10/18/2021 7:30 AM [name] is a 65 y.o. female with PMH alpha gal sensitivity, anxiety, coronary artery disease status post stent placement, carotid stenosis, hyperlipidemia, nephrolithiasis, GERD, hypertension and IBS, who presents for history of colon polyps/Fhx of colon cancer and throat pain. BP (!) 137/94   Pulse 94   Temp 98.7 F (37.1 C) (Oral)   Resp 16   SpO2 96%  GENERAL: The patient is AO x3, in no acute distress. HEENT: Head is normocephalic and atraumatic. EOMI are intact. Mouth is well hydrated and without lesions. NECK: Supple. No masses LUNGS: Clear to auscultation. No presence of rhonchi/wheezing/rales. Adequate chest expansion HEART: RRR, normal s1 and s2. ABDOMEN: Soft, nontender, no guarding, no peritoneal signs, and nondistended. BS +. No masses. EXTREMITIES: Without any cyanosis, clubbing, rash, lesions or edema. NEUROLOGIC: AOx3, no focal motor deficit. SKIN: no jaundice, no rashes  [name]  has presented today for surgery, with the diagnosis of Family Hx of colon ca hx of colon polyps Dysphagia.  The various methods of treatment have been discussed with the patient and family. After consideration of risks, benefits and other options for treatment, the patient has consented to  Procedure(s) with comments: COLONOSCOPY WITH PROPOFOL (N/A) - 8:30 ESOPHAGOGASTRODUODENOSCOPY (EGD) WITH PROPOFOL (N/A) as a surgical intervention.  The patient's history has been reviewed, patient examined, no change in status, stable for surgery.  I have reviewed the patient's chart and labs.  Questions were answered to the patient's satisfaction.     Maylon Peppers [place]

## 2021-10-18 NOTE — Op Note (Signed)
[facility] Patient Name: [name] Procedure Date: 10/18/2021 8:25 AM MRN: [id number] Date of Birth: 1956/11/24 Attending MD: Maylon Peppers ,  CSN: [id number] Age: 65 Admit Type: Outpatient Procedure:                Upper GI endoscopy Indications:              Dysphagia Providers:                Maylon Peppers, Lambert Mody Raphael Gibney,                            Technician Referring MD:              Medicines:                Monitored Anesthesia Care Complications:            No immediate complications. Estimated Blood Loss:     Estimated blood loss: none. Procedure:                Pre-Anesthesia Assessment:                           - Prior to the procedure, a History and Physical                            was performed, and patient medications, allergies                            and sensitivities were reviewed. The patient's                            tolerance of previous anesthesia was reviewed.                           - The risks and benefits of the procedure and the                            sedation options and risks were discussed with the                            patient. All questions were answered and informed                            consent was obtained.                           - ASA Grade Assessment: III - A patient with severe                            systemic disease.                           After obtaining informed consent, the endoscope was                            passed under direct vision. Throughout the  procedure, the patient's blood pressure, pulse, and                            oxygen saturations were monitored continuously. The                            [id number]-H190 ([id number]) scope was introduced through the                            mouth, and advanced to the second part of duodenum.                            The upper GI endoscopy was accomplished without                             difficulty. The patient tolerated the procedure                            well. Scope In: 8:38:56 AM Scope Out: 8:54:03 AM Total Procedure Duration: 0 hours 15 minutes 7 seconds  Findings:      No endoscopic abnormality was evident in the esophagus to explain the       patient's complaint of dysphagia. It was decided, however, to proceed       with dilation of the entire esophagus. A guidewire was placed and the       scope was withdrawn. Dilation was performed with a Savary dilator with       mild resistance at 17 mm. Upon reinspection, there was visualization of       mucosal disruption in upper thirds of esophagus and at the GE junction.       Biopsies were obtained from the proximal and distal esophagus with cold       forceps for histology.      A 1 cm hiatal hernia was present.      The entire examined stomach was normal.      The examined duodenum was normal. Impression:               - No endoscopic esophageal abnormality to explain                            patient's dysphagia. Esophagus dilated. Biopsied.                           - 1 cm hiatal hernia.                           - Normal stomach.                           - Normal examined duodenum. Moderate Sedation:      Per Anesthesia Care Recommendation:           - Discharge patient to home (ambulatory).                           - Resume previous diet.                           -  Await pathology results.                           - Continue present medications. Procedure Code(s):        --- Professional ---                           [phone], Esophagogastroduodenoscopy, flexible,                            transoral; with insertion of guide wire followed by                            passage of dilator(s) through esophagus over guide                            wire                           43239, 27, Esophagogastroduodenoscopy, flexible,                            transoral; with biopsy, single or multiple Diagnosis  Code(s):        --- Professional ---                           R13.10, Dysphagia, unspecified                           K44.9, Diaphragmatic hernia without obstruction or                            gangrene CPT copyright 2019 American Medical Association. All rights reserved. The codes documented in this report are preliminary and upon coder review may  be revised to meet current compliance requirements. Maylon Peppers, MD Maylon Peppers,  10/18/2021 8:59:55 AM This report has been signed electronically. Number of Addenda: 0

## 2021-10-18 NOTE — Discharge Instructions (Addendum)
You are being discharged to home.  Resume your previous diet.  We are waiting for your pathology results.  Continue your present medications.  Your physician has recommended a repeat colonoscopy in three years for surveillance.

## 2021-10-18 NOTE — Op Note (Signed)
[facility] Patient Name: [name] Procedure Date: 10/18/2021 8:57 AM MRN: [id number] Date of Birth: January 05, 1956 Attending MD: Maylon Peppers ,  CSN: [id number] Age: 65 Admit Type: Outpatient Procedure:                Colonoscopy Indications:              Screening in patient at increased risk: Family                            history of 1st-degree relative with colorectal                            cancer before age 70 years, High risk colon cancer                            surveillance: Personal history of colonic polyps Providers:                Maylon Peppers, Lambert Mody, Thomas Hoff., Technician Referring MD:              Medicines:                Monitored Anesthesia Care Complications:            No immediate complications. Estimated Blood Loss:     Estimated blood loss: none. Procedure:                Pre-Anesthesia Assessment:                           - Prior to the procedure, a History and Physical                            was performed, and patient medications, allergies                            and sensitivities were reviewed. The patient's                            tolerance of previous anesthesia was reviewed.                           - The risks and benefits of the procedure and the                            sedation options and risks were discussed with the                            patient. All questions were answered and informed                            consent was obtained.                           - ASA Grade Assessment: III -  A patient with severe                            systemic disease.                           After obtaining informed consent, the colonoscope                            was passed under direct vision. Throughout the                            procedure, the patient's blood pressure, pulse, and                            oxygen saturations were monitored continuously. The                             [id number]-HQ190L ([id number]) scope was introduced through                            the anus and advanced to the the cecum, identified                            by appendiceal orifice and ileocecal valve. The                            colonoscopy was performed without difficulty. The                            patient tolerated the procedure well. The quality                            of the bowel preparation was good. Scope In: 9:01:05 AM Scope Out: 9:30:57 AM Scope Withdrawal Time: 0 hours 12 minutes 7 seconds  Total Procedure Duration: 0 hours 29 minutes 52 seconds  Findings:      The perianal and digital rectal examinations were normal.      A 5 mm polyp was found in the cecum. The polyp was sessile. The polyp       was removed with a cold snare. Resection and retrieval were complete.      Scattered small-mouthed diverticula were found in the sigmoid colon.      An area of moderately congested mucosa was found in the sigmoid colon.      Non-bleeding internal hemorrhoids were found during retroflexion. The       hemorrhoids were small. Impression:               - One 5 mm polyp in the cecum, removed with a cold                            snare. Resected and retrieved.                           - Diverticulosis in the sigmoid colon.                           -  Congested mucosa in the sigmoid colon.                           - Non-bleeding internal hemorrhoids. Moderate Sedation:      Per Anesthesia Care Recommendation:           - Discharge patient to home (ambulatory).                           - Resume previous diet.                           - Await pathology results.                           - Repeat colonoscopy in 3 years for surveillance. Procedure Code(s):        --- Professional ---                           [phone], Colonoscopy, flexible; with removal of                            tumor(s), polyp(s), or other lesion(s) by snare                             technique Diagnosis Code(s):        --- Professional ---                           Z80.0, Family history of malignant neoplasm of                            digestive organs                           Z86.010, Personal history of colonic polyps                           K63.5, Polyp of colon                           K64.8, Other hemorrhoids                           K63.89, Other specified diseases of intestine                           K57.30, Diverticulosis of large intestine without                            perforation or abscess without bleeding CPT copyright 2019 American Medical Association. All rights reserved. The codes documented in this report are preliminary and upon coder review may  be revised to meet current compliance requirements. Maylon Peppers, MD Maylon Peppers,  10/18/2021 9:38:07 AM This report has been signed electronically. Number of Addenda: 0

## 2021-10-19 ENCOUNTER — Encounter (HOSPITAL_COMMUNITY): Payer: Self-pay | Admitting: Gastroenterology

## 2021-10-19 LAB — SURGICAL PATHOLOGY

## 2021-10-24 ENCOUNTER — Ambulatory Visit (INDEPENDENT_AMBULATORY_CARE_PROVIDER_SITE_OTHER): Payer: BC Managed Care – PPO | Admitting: Gastroenterology

## 2021-10-24 ENCOUNTER — Encounter (INDEPENDENT_AMBULATORY_CARE_PROVIDER_SITE_OTHER): Payer: Self-pay | Admitting: *Deleted

## 2021-11-01 IMAGING — US US CAROTID DUPLEX BILAT
1 series · 13 of 24 positions shown · non-contrast
Comparison: None.

CLINICAL DATA: 63-year-old female with bilateral carotid arterial
disease

EXAM:
BILATERAL CAROTID DUPLEX ULTRASOUND
TECHNIQUE: Gray scale imaging, color Doppler and duplex ultrasound were
performed of bilateral carotid and vertebral arteries in the neck.

[Series 1: us carotid bilateral · 13 of 76 slices shown]
[im 1/76]
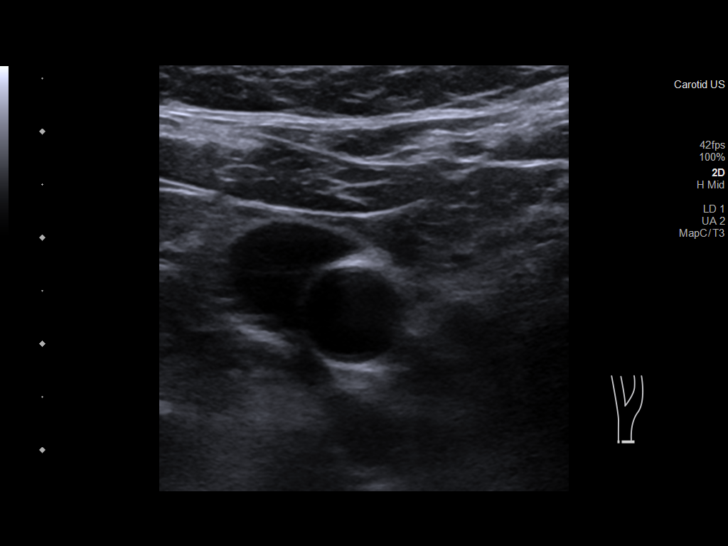
[im 7/76]
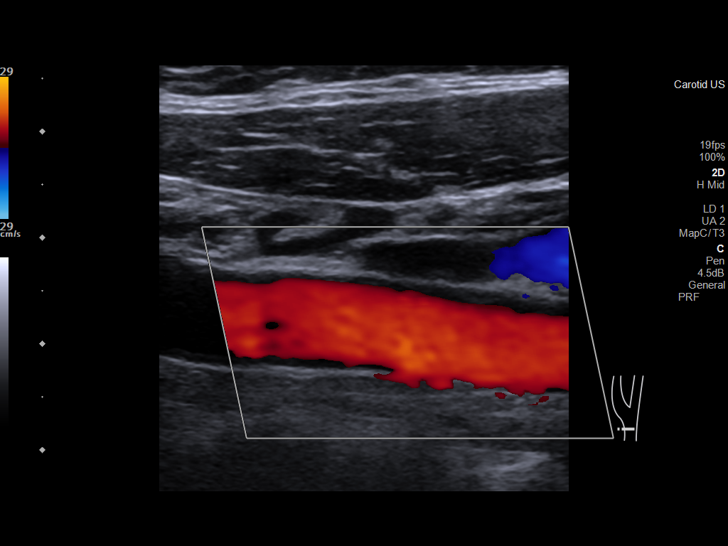
[im 14/76]
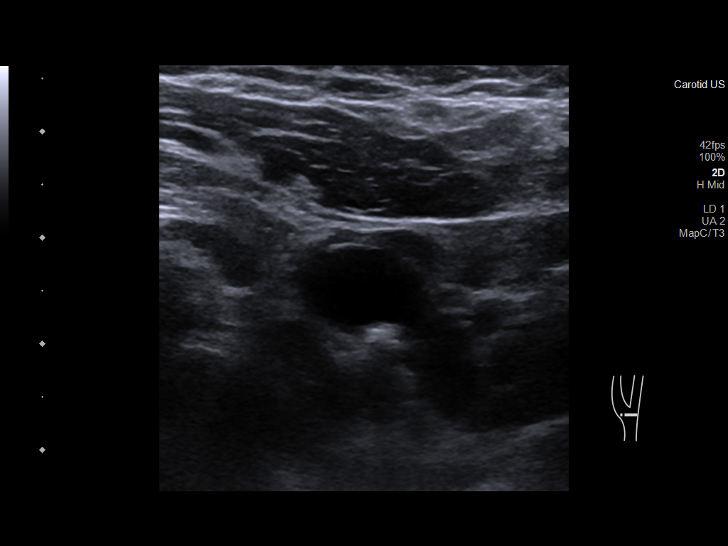
[im 20/76]
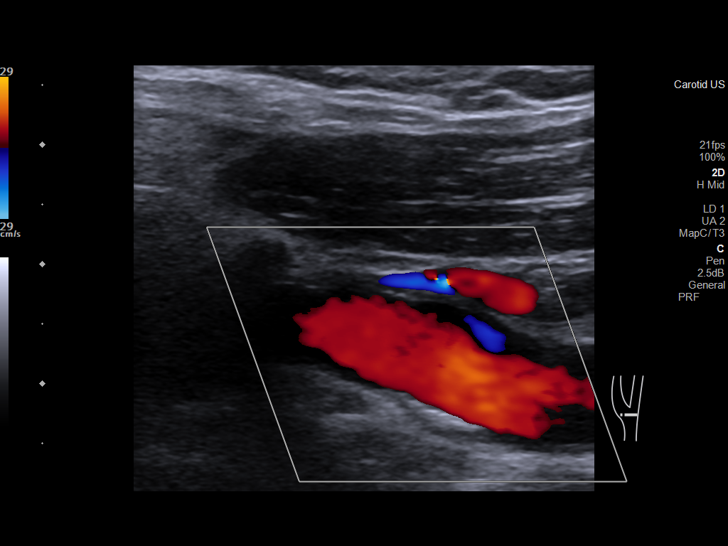
[im 27/76]
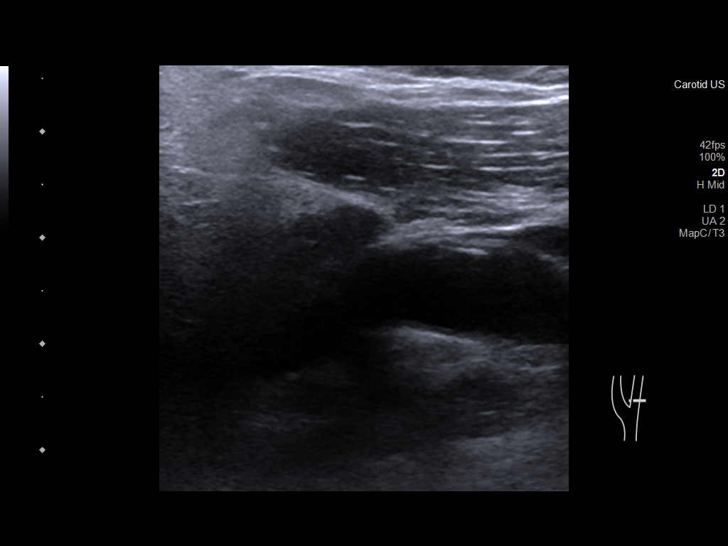
[im 33/76]
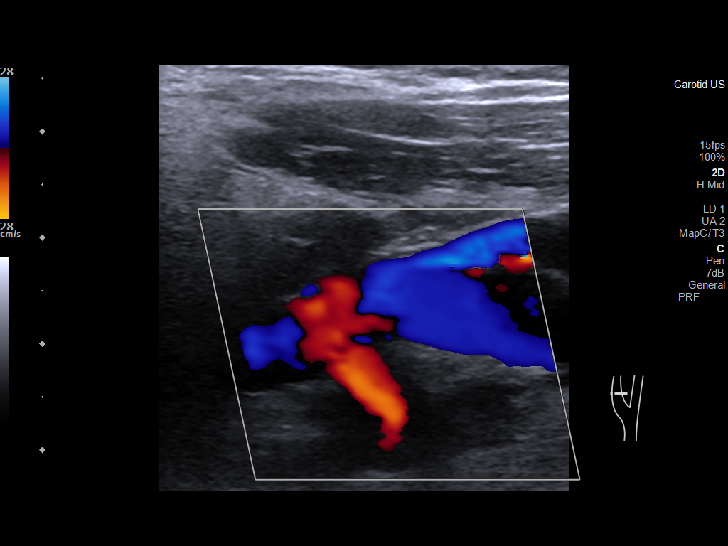
[im 40/76]
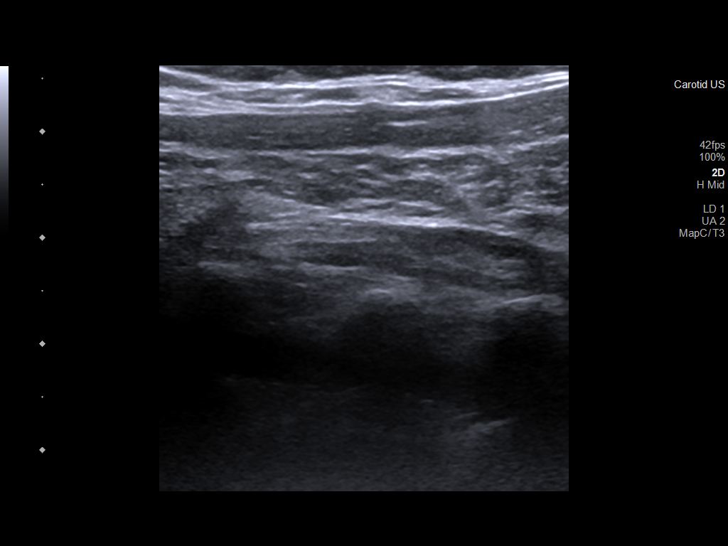
[im 43/76]
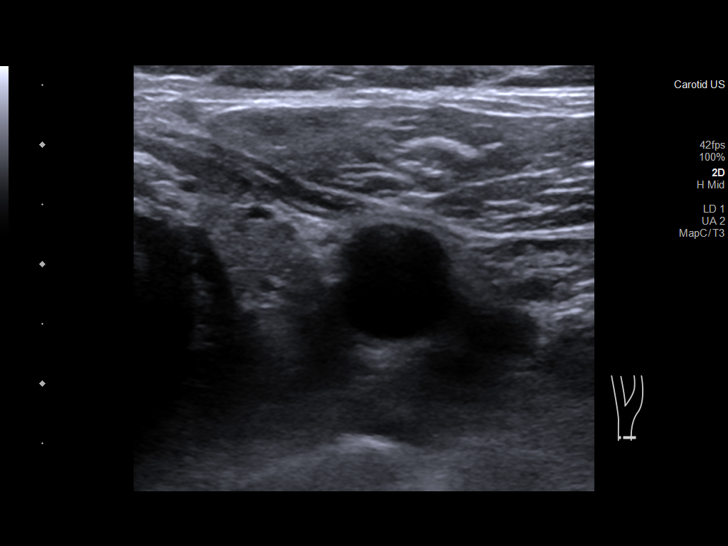
[im 49/76]
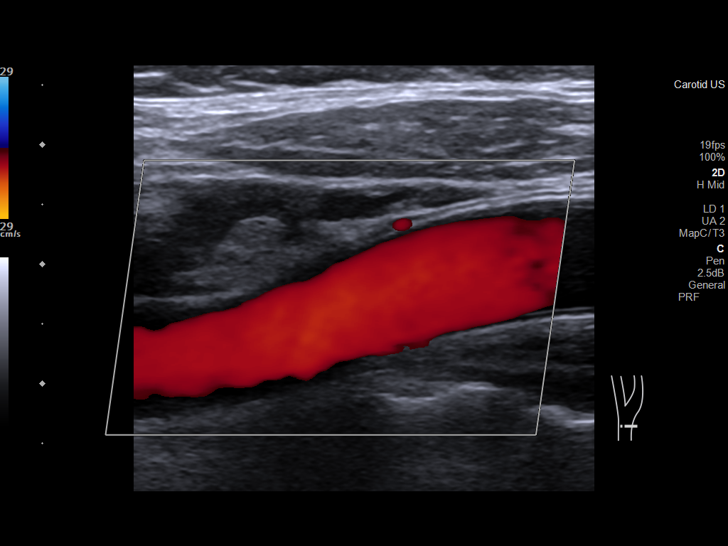
[im 56/76]
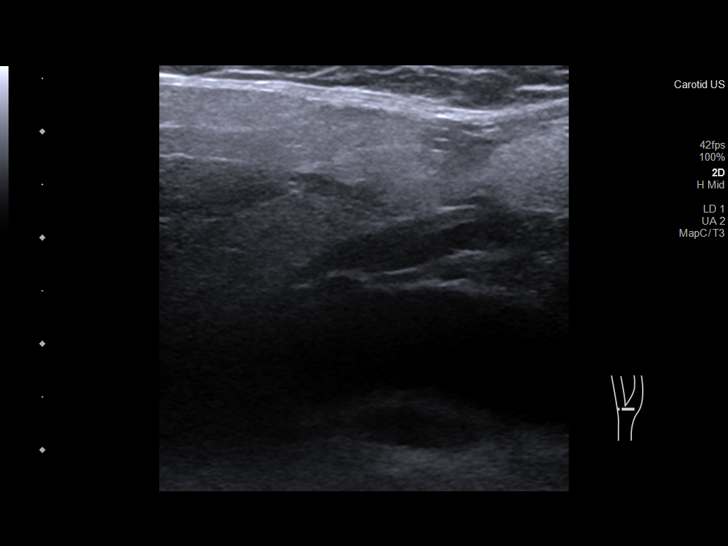
[im 62/76]
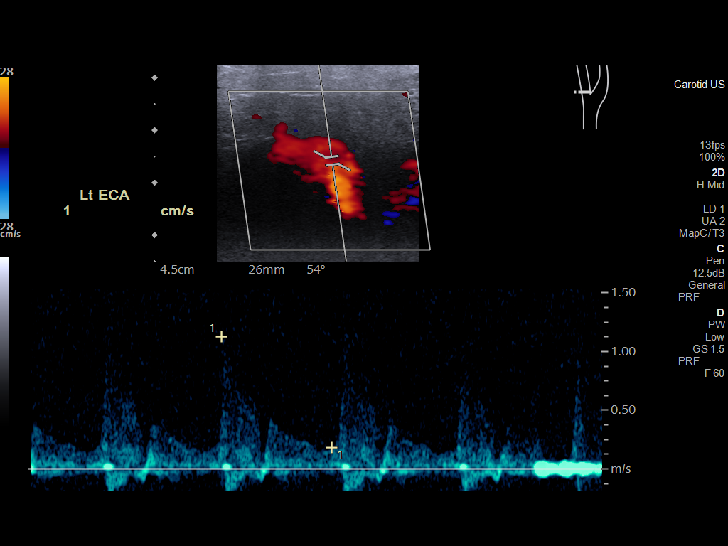
[im 69/76]
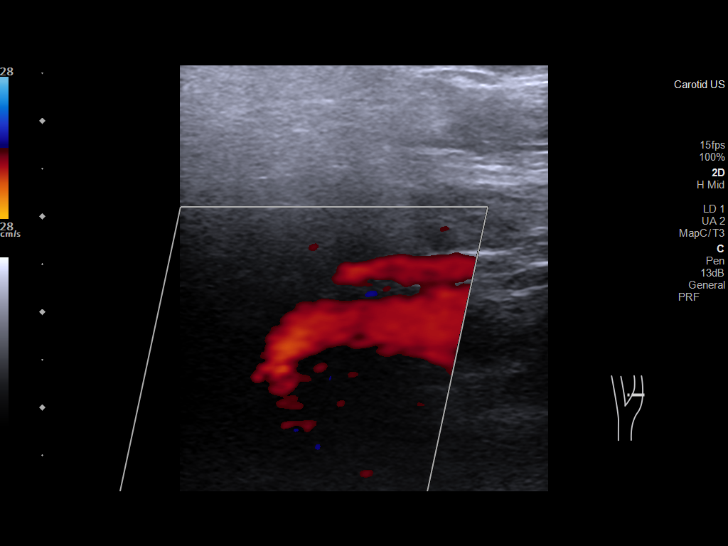
[im 76/76]
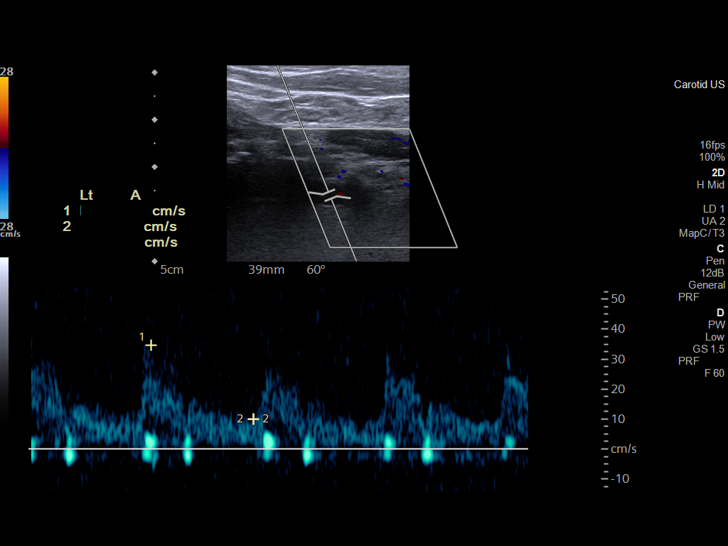

[13 of 24 positions shown; findings below may reference images not displayed]

FINDINGS: Criteria: Quantification of carotid stenosis is based on velocity
parameters that correlate the residual internal carotid diameter
with NASCET-based stenosis levels, using the diameter of the distal
internal carotid lumen as the denominator for stenosis measurement.

The following velocity measurements were obtained:

RIGHT

ICA:  Systolic 84 cm/sec, Diastolic 25 cm/sec

CCA:  85 cm/sec

SYSTOLIC ICA/CCA RATIO:

ECA:  116 cm/sec

LEFT

ICA:  Systolic 55 cm/sec, Diastolic 19 cm/sec

CCA:  71 cm/sec

SYSTOLIC ICA/CCA RATIO:

ECA:  120 cm/sec

Right Brachial SBP: Not acquired

Left Brachial SBP: Not acquired

RIGHT CAROTID ARTERY: No significant calcified disease of the right
common carotid artery. Intermediate waveform maintained.
Heterogeneous plaque without significant calcifications at the right
carotid bifurcation. Low resistance waveform of the right ICA.
Tortuosity

RIGHT VERTEBRAL ARTERY: Antegrade flow with low resistance waveform.

LEFT CAROTID ARTERY: No significant calcified disease of the left
common carotid artery. Intermediate waveform maintained.
Heterogeneous plaque at the left carotid bifurcation without
significant calcifications. Low resistance waveform of the left ICA.
Tortuosity

LEFT VERTEBRAL ARTERY:  Antegrade flow with low resistance waveform.
IMPRESSION: Color duplex indicates minimal heterogeneous plaque, with no
hemodynamically significant stenosis by duplex criteria in the
extracranial cerebrovascular circulation.

## 2021-11-07 ENCOUNTER — Ambulatory Visit: Payer: Self-pay | Admitting: Physician Assistant

## 2021-11-07 DIAGNOSIS — G8929 Other chronic pain: Secondary | ICD-10-CM

## 2021-11-07 DIAGNOSIS — M1711 Unilateral primary osteoarthritis, right knee: Secondary | ICD-10-CM

## 2021-11-07 NOTE — H&P (Signed)
TOTAL KNEE ADMISSION H&P  Patient is being admitted for right total knee arthroplasty.  Subjective:  Chief Complaint:right knee pain.  HPI: [name], 65 y.o. female, has a history of pain and functional disability in the right knee due to arthritis and has failed non-surgical conservative treatments for greater than 12 weeks to includeNSAID's and/or analgesics, corticosteriod injections, and activity modification.  Onset of symptoms was gradual, starting 9 years ago with gradually worsening course since that time. The patient noted no past surgery on the right knee(s).  Patient currently rates pain in the right knee(s) at 10 out of 10 with activity. Patient has night pain, worsening of pain with activity and weight bearing, pain that interferes with activities of daily living, pain with passive range of motion, crepitus, and joint swelling.  Patient has evidence of periarticular osteophytes and joint space narrowing by imaging studies. There is no active infection.  Patient Active Problem List   Diagnosis Date Noted   IBS (irritable bowel syndrome) 10/03/2021   Class 2 obesity 09/18/2021   Acute diverticulitis of intestine 09/16/2021   Acute diverticulitis 09/15/2021   Encounter for gynecological examination with Papanicolaou smear of cervix 06/16/2019   Screening for colorectal cancer 06/16/2019   PMB (postmenopausal bleeding) 06/16/2019   Chronic total occlusion of coronary artery 07/03/2017   Chest pain 07/01/2017   Abdominal pain, epigastric 10/10/2016   Gastroesophageal reflux disease without esophagitis 10/10/2016   GERD (gastroesophageal reflux disease) 03/02/2014   Family hx of colon cancer 03/02/2014   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   Arthritis of knee 10/31/2011   Tobacco abuse 03/28/2011   Obesity 03/28/2011   CAD (coronary artery disease)    HTN (hypertension)    Hyperlipidemia    Anxiety    Past Medical History:  Diagnosis Date   Acute ST elevation myocardial  infarction (STEMI) of inferior wall ([place]) 2012   Anxiety    Arthritis    CAD (coronary artery disease)    DES to the proximal and distal RCA April 2012 in the setting of inferior STEMI; occluded mid to distal RCA with left-to-right collaterals 2018, managed medically   Carotid stenosis    Less than 50% 8/11   Chronic low back pain    Depression    Diverticulitis    Diverticulosis    Essential hypertension    Fibromyalgia    GERD (gastroesophageal reflux disease) 03/02/2014   H/O hiatal hernia    History of kidney stones    Hyperlipidemia    Urticaria     Past Surgical History:  Procedure Laterality Date   BIOPSY  10/18/2021   Procedure: BIOPSY;  Surgeon: Harvel Quale, MD;  [facility];  Service: Gastroenterology;;   CARDIAC SURGERY     stent placement   CHOLECYSTECTOMY     COLONOSCOPY N/A 04/01/2014   Procedure: COLONOSCOPY;  Surgeon: Rogene Houston, MD;  [facility];  Service: Endoscopy;  Laterality: N/A;  100   COLONOSCOPY WITH PROPOFOL N/A 10/18/2021   Procedure: COLONOSCOPY WITH PROPOFOL;  Surgeon: Harvel Quale, MD;  [facility];  Service: Gastroenterology;  Laterality: N/A;  8:30   Cyst removed from spine     ECTOPIC PREGNANCY SURGERY     ESOPHAGOGASTRODUODENOSCOPY N/A 04/01/2014   Procedure: ESOPHAGOGASTRODUODENOSCOPY (EGD);  Surgeon: Rogene Houston, MD;  [facility];  Service: Endoscopy;  Laterality: N/A;  100   ESOPHAGOGASTRODUODENOSCOPY N/A 11/03/2016   Procedure: ESOPHAGOGASTRODUODENOSCOPY (EGD);  Surgeon: Rogene Houston, MD;  [facility];  Service: Endoscopy;  Laterality: N/A;  2:45   ESOPHAGOGASTRODUODENOSCOPY (EGD) WITH PROPOFOL N/A 10/18/2021   Procedure: ESOPHAGOGASTRODUODENOSCOPY (EGD) WITH PROPOFOL;  Surgeon: Harvel Quale, MD;  [facility];  Service: Gastroenterology;  Laterality: N/A;   Heart stent     2012  x 2 stents   LEFT HEART CATH AND CORONARY  ANGIOGRAPHY N/A 07/03/2017   Procedure: LEFT HEART CATH AND CORONARY ANGIOGRAPHY;  Surgeon: Burnell Blanks, MD;  Location: [facility];  Service: Cardiovascular;  Laterality: N/A;   Left knee     Arthroscopic   Left wrist     POLYPECTOMY  10/18/2021   Procedure: POLYPECTOMY;  Surgeon: Harvel Quale, MD;  [facility];  Service: Gastroenterology;;   Azzie Almas DILATION  10/18/2021   Procedure: Azzie Almas DILATION;  Surgeon: Montez Morita, Quillian Quince, MD;  [facility];  Service: Gastroenterology;;   TONSILLECTOMY AND ADENOIDECTOMY     TOTAL KNEE ARTHROPLASTY Left 09/26/2013   Procedure: TOTAL KNEE ARTHROPLASTY- LEFT;  Surgeon: [name]., MD;  Location: [facility];  Service: Orthopedics;  Laterality: Left;   TUBAL LIGATION      Current Outpatient Medications  Medication Sig Dispense Refill Last Dose   acetaminophen (TYLENOL) 650 MG CR tablet Take 1,300 mg by mouth every 8 (eight) hours as needed for pain.      amLODipine (NORVASC) 5 MG tablet Take 5 mg by mouth daily.      aspirin 81 MG tablet Take 81 mg by mouth daily.      busPIRone (BUSPAR) 5 MG tablet Take 5 mg by mouth 2 (two) times daily as needed (anxiety).      cyclobenzaprine (FLEXERIL) 5 MG tablet Take 1 tablet (5 mg total) by mouth 3 (three) times daily. (Patient taking differently: Take 5 mg by mouth 3 (three) times daily as needed for muscle spasms.) 21 tablet 0    dicyclomine (BENTYL) 10 MG capsule Take 1 capsule (10 mg total) by mouth every 12 (twelve) hours as needed for spasms (abdominal pain). 90 capsule 0    docusate sodium (COLACE) 100 MG capsule Take 100 mg by mouth daily.      DULoxetine (CYMBALTA) 60 MG capsule Take 60 mg by mouth daily.  3    HYDROcodone-acetaminophen (NORCO/VICODIN) 5-325 MG tablet Take 1 tablet by mouth every 4 (four) hours as needed.      hydrocortisone 2.5 % cream Apply 1 application topically daily as needed (Hemorrhoids).      isosorbide mononitrate  (IMDUR) 30 MG 24 hr tablet TAKE 1 TABLET(30 MG) BY MOUTH DAILY 90 tablet 1    LORazepam (ATIVAN) 0.5 MG tablet Take 0.5 mg by mouth daily as needed for sleep.      losartan (COZAAR) 100 MG tablet Take 1 tablet (100 mg total) by mouth daily. 90 tablet 3    nitroGLYCERIN (NITROSTAT) 0.4 MG SL tablet Place 1 tablet (0.4 mg total) under the tongue every 5 (five) minutes as needed for chest pain. 25 tablet 3    pantoprazole (PROTONIX) 40 MG tablet Take 1 tablet (40 mg total) by mouth 2 (two) times daily before a meal. 60 tablet 1    polyethylene glycol (MIRALAX / GLYCOLAX) 17 g packet Take 17 g by mouth daily. 14 each 0    REPATHA SURECLICK 659 MG/ML SOAJ ADMINISTER 1 ML UNDER THE SKIN EVERY 14 DAYS AS DIRECTED 6 mL 0    zolpidem (AMBIEN) 10 MG tablet Take  10 mg by mouth at bedtime.   3    No current facility-administered medications for this visit.   Allergies  Allergen Reactions   Ancef [Cefazolin] Itching    Itching around mouth - sx resolved quickly after administration of benadryl; with no further sx   Ciprofloxacin Hcl     unknown   Meat [Alpha-Gal] Diarrhea   Metaxalone     unknown   Morphine And Related Itching    Mild itching to head  and extremities, self resolved without intervention.    Sulfa Antibiotics Itching, Rash and Other (See Comments)    "severe muscle cramps, tongue cracked, dry mouth "    Social History   Tobacco Use   Smoking status: Former    Packs/day: 1.00    Years: 10.00    Pack years: 10.00    Types: Cigarettes    Start date: 06/27/1979    Quit date: 11/27/2010    Years since quitting: 10.9   Smokeless tobacco: Never  Substance Use Topics   Alcohol use: Yes    Alcohol/week: 0.0 standard drinks    Comment: social    Family History  Problem Relation Age of Onset   Colon cancer Father    Heart disease Mother    Heart disease Other    Arthritis Other    Cancer Other    Allergic rhinitis Neg Hx    Angioedema Neg Hx    Asthma Neg Hx    Atopy Neg Hx     Eczema Neg Hx    Immunodeficiency Neg Hx    Urticaria Neg Hx      Review of Systems  Cardiovascular:  Positive for chest pain and leg swelling.  Gastrointestinal:  Positive for constipation and diarrhea.  Genitourinary:  Positive for dysuria and frequency.  Musculoskeletal:  Positive for arthralgias.  All other systems reviewed and are negative.  Objective:  Physical Exam Constitutional:      General: She is not in acute distress.    Appearance: Normal appearance.  HENT:     Head: Normocephalic and atraumatic.  Eyes:     Extraocular Movements: Extraocular movements intact.     Pupils: Pupils are equal, round, and reactive to light.  Cardiovascular:     Rate and Rhythm: Normal rate and regular rhythm.     Pulses: Normal pulses.     Heart sounds: Normal heart sounds. No murmur heard. Pulmonary:     Effort: Pulmonary effort is normal. No respiratory distress.     Breath sounds: Normal breath sounds.  Abdominal:     General: Abdomen is flat. Bowel sounds are normal. There is no distension.     Palpations: Abdomen is soft.     Tenderness: There is no abdominal tenderness.  Musculoskeletal:     Cervical back: Normal range of motion and neck supple.     Comments: Examination of the right lower extremity again shows she is neurovascularly intact.  Good flexion and extension with the knee.  Stable to varus and valgus.  Joint line tenderness.  Mild swelling.  No significant effusion.  No warmth or erythema.  Lymphadenopathy:     Cervical: No cervical adenopathy.  Skin:    General: Skin is warm and dry.     Findings: No erythema or rash.  Neurological:     General: No focal deficit present.     Mental Status: She is alert and oriented to person, place, and time.  Psychiatric:        Mood  and Affect: Mood normal.        Behavior: Behavior normal.    Vital signs in last 24 hours: @VSRANGES @  Labs:   Estimated body mass index is 37.07 kg/m as calculated from the  following:   Height as of 10/03/21: 5\' 2"  (1.575 m).   Weight as of 10/03/21: 91.9 kg.   Imaging Review Plain radiographs demonstrate moderate degenerative joint disease of the right knee(s). The overall alignment ismild varus. The bone quality appears to be good for age and reported activity level.      Assessment/Plan:  End stage arthritis, right knee   The patient history, physical examination, clinical judgment of the provider and imaging studies are consistent with end stage degenerative joint disease of the right knee(s) and total knee arthroplasty is deemed medically necessary. The treatment options including medical management, injection therapy arthroscopy and arthroplasty were discussed at length. The risks and benefits of total knee arthroplasty were presented and reviewed. The risks due to aseptic loosening, infection, stiffness, patella tracking problems, thromboembolic complications and other imponderables were discussed. The patient acknowledged the explanation, agreed to proceed with the plan and consent was signed. Patient is being admitted for inpatient treatment for surgery, pain control, PT, OT, prophylactic antibiotics, VTE prophylaxis, progressive ambulation and ADL's and discharge planning. The patient is planning to be discharged  home with outpt PT.     Anticipated LOS equal to or greater than 2 midnights due to - Age 17 and older with one or more of the following:  - Obesity  - Expected need for hospital services (PT, OT, Nursing) required for safe  discharge  - Anticipated need for postoperative skilled nursing care or inpatient rehab  - Active co-morbidities: Coronary Artery Disease and Heart Attack OR   - Unanticipated findings during/Post Surgery: None  - Patient is a high risk of re-admission due to: None

## 2021-11-07 NOTE — H&P (View-Only) (Signed)
TOTAL KNEE ADMISSION H&P  Patient is being admitted for right total knee arthroplasty.  Subjective:  Chief Complaint:right knee pain.  HPI: [name], 65 y.o. female, has a history of pain and functional disability in the right knee due to arthritis and has failed non-surgical conservative treatments for greater than 12 weeks to includeNSAID's and/or analgesics, corticosteriod injections, and activity modification.  Onset of symptoms was gradual, starting 9 years ago with gradually worsening course since that time. The patient noted no past surgery on the right knee(s).  Patient currently rates pain in the right knee(s) at 10 out of 10 with activity. Patient has night pain, worsening of pain with activity and weight bearing, pain that interferes with activities of daily living, pain with passive range of motion, crepitus, and joint swelling.  Patient has evidence of periarticular osteophytes and joint space narrowing by imaging studies. There is no active infection.  Patient Active Problem List   Diagnosis Date Noted   IBS (irritable bowel syndrome) 10/03/2021   Class 2 obesity 09/18/2021   Acute diverticulitis of intestine 09/16/2021   Acute diverticulitis 09/15/2021   Encounter for gynecological examination with Papanicolaou smear of cervix 06/16/2019   Screening for colorectal cancer 06/16/2019   PMB (postmenopausal bleeding) 06/16/2019   Chronic total occlusion of coronary artery 07/03/2017   Chest pain 07/01/2017   Abdominal pain, epigastric 10/10/2016   Gastroesophageal reflux disease without esophagitis 10/10/2016   GERD (gastroesophageal reflux disease) 03/02/2014   Family hx of colon cancer 03/02/2014   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   Arthritis of knee 10/31/2011   Tobacco abuse 03/28/2011   Obesity 03/28/2011   CAD (coronary artery disease)    HTN (hypertension)    Hyperlipidemia    Anxiety    Past Medical History:  Diagnosis Date   Acute ST elevation myocardial  infarction (STEMI) of inferior wall ([place]) 2012   Anxiety    Arthritis    CAD (coronary artery disease)    DES to the proximal and distal RCA April 2012 in the setting of inferior STEMI; occluded mid to distal RCA with left-to-right collaterals 2018, managed medically   Carotid stenosis    Less than 50% 8/11   Chronic low back pain    Depression    Diverticulitis    Diverticulosis    Essential hypertension    Fibromyalgia    GERD (gastroesophageal reflux disease) 03/02/2014   H/O hiatal hernia    History of kidney stones    Hyperlipidemia    Urticaria     Past Surgical History:  Procedure Laterality Date   BIOPSY  10/18/2021   Procedure: BIOPSY;  Surgeon: Harvel Quale, MD;  [facility];  Service: Gastroenterology;;   CARDIAC SURGERY     stent placement   CHOLECYSTECTOMY     COLONOSCOPY N/A 04/01/2014   Procedure: COLONOSCOPY;  Surgeon: Rogene Houston, MD;  [facility];  Service: Endoscopy;  Laterality: N/A;  100   COLONOSCOPY WITH PROPOFOL N/A 10/18/2021   Procedure: COLONOSCOPY WITH PROPOFOL;  Surgeon: Harvel Quale, MD;  [facility];  Service: Gastroenterology;  Laterality: N/A;  8:30   Cyst removed from spine     ECTOPIC PREGNANCY SURGERY     ESOPHAGOGASTRODUODENOSCOPY N/A 04/01/2014   Procedure: ESOPHAGOGASTRODUODENOSCOPY (EGD);  Surgeon: Rogene Houston, MD;  [facility];  Service: Endoscopy;  Laterality: N/A;  100   ESOPHAGOGASTRODUODENOSCOPY N/A 11/03/2016   Procedure: ESOPHAGOGASTRODUODENOSCOPY (EGD);  Surgeon: Rogene Houston, MD;  [facility];  Service: Endoscopy;  Laterality: N/A;  2:45   ESOPHAGOGASTRODUODENOSCOPY (EGD) WITH PROPOFOL N/A 10/18/2021   Procedure: ESOPHAGOGASTRODUODENOSCOPY (EGD) WITH PROPOFOL;  Surgeon: Harvel Quale, MD;  [facility];  Service: Gastroenterology;  Laterality: N/A;   Heart stent     2012  x 2 stents   LEFT HEART CATH AND CORONARY  ANGIOGRAPHY N/A 07/03/2017   Procedure: LEFT HEART CATH AND CORONARY ANGIOGRAPHY;  Surgeon: Burnell Blanks, MD;  Location: [facility];  Service: Cardiovascular;  Laterality: N/A;   Left knee     Arthroscopic   Left wrist     POLYPECTOMY  10/18/2021   Procedure: POLYPECTOMY;  Surgeon: Harvel Quale, MD;  [facility];  Service: Gastroenterology;;   Azzie Almas DILATION  10/18/2021   Procedure: Azzie Almas DILATION;  Surgeon: Montez Morita, Quillian Quince, MD;  [facility];  Service: Gastroenterology;;   TONSILLECTOMY AND ADENOIDECTOMY     TOTAL KNEE ARTHROPLASTY Left 09/26/2013   Procedure: TOTAL KNEE ARTHROPLASTY- LEFT;  Surgeon: [name]., MD;  Location: [facility];  Service: Orthopedics;  Laterality: Left;   TUBAL LIGATION      Current Outpatient Medications  Medication Sig Dispense Refill Last Dose   acetaminophen (TYLENOL) 650 MG CR tablet Take 1,300 mg by mouth every 8 (eight) hours as needed for pain.      amLODipine (NORVASC) 5 MG tablet Take 5 mg by mouth daily.      aspirin 81 MG tablet Take 81 mg by mouth daily.      busPIRone (BUSPAR) 5 MG tablet Take 5 mg by mouth 2 (two) times daily as needed (anxiety).      cyclobenzaprine (FLEXERIL) 5 MG tablet Take 1 tablet (5 mg total) by mouth 3 (three) times daily. (Patient taking differently: Take 5 mg by mouth 3 (three) times daily as needed for muscle spasms.) 21 tablet 0    dicyclomine (BENTYL) 10 MG capsule Take 1 capsule (10 mg total) by mouth every 12 (twelve) hours as needed for spasms (abdominal pain). 90 capsule 0    docusate sodium (COLACE) 100 MG capsule Take 100 mg by mouth daily.      DULoxetine (CYMBALTA) 60 MG capsule Take 60 mg by mouth daily.  3    HYDROcodone-acetaminophen (NORCO/VICODIN) 5-325 MG tablet Take 1 tablet by mouth every 4 (four) hours as needed.      hydrocortisone 2.5 % cream Apply 1 application topically daily as needed (Hemorrhoids).      isosorbide mononitrate  (IMDUR) 30 MG 24 hr tablet TAKE 1 TABLET(30 MG) BY MOUTH DAILY 90 tablet 1    LORazepam (ATIVAN) 0.5 MG tablet Take 0.5 mg by mouth daily as needed for sleep.      losartan (COZAAR) 100 MG tablet Take 1 tablet (100 mg total) by mouth daily. 90 tablet 3    nitroGLYCERIN (NITROSTAT) 0.4 MG SL tablet Place 1 tablet (0.4 mg total) under the tongue every 5 (five) minutes as needed for chest pain. 25 tablet 3    pantoprazole (PROTONIX) 40 MG tablet Take 1 tablet (40 mg total) by mouth 2 (two) times daily before a meal. 60 tablet 1    polyethylene glycol (MIRALAX / GLYCOLAX) 17 g packet Take 17 g by mouth daily. 14 each 0    REPATHA SURECLICK 767 MG/ML SOAJ ADMINISTER 1 ML UNDER THE SKIN EVERY 14 DAYS AS DIRECTED 6 mL 0    zolpidem (AMBIEN) 10 MG tablet Take  10 mg by mouth at bedtime.   3    No current facility-administered medications for this visit.   Allergies  Allergen Reactions   Ancef [Cefazolin] Itching    Itching around mouth - sx resolved quickly after administration of benadryl; with no further sx   Ciprofloxacin Hcl     unknown   Meat [Alpha-Gal] Diarrhea   Metaxalone     unknown   Morphine And Related Itching    Mild itching to head  and extremities, self resolved without intervention.    Sulfa Antibiotics Itching, Rash and Other (See Comments)    "severe muscle cramps, tongue cracked, dry mouth "    Social History   Tobacco Use   Smoking status: Former    Packs/day: 1.00    Years: 10.00    Pack years: 10.00    Types: Cigarettes    Start date: 06/27/1979    Quit date: 11/27/2010    Years since quitting: 10.9   Smokeless tobacco: Never  Substance Use Topics   Alcohol use: Yes    Alcohol/week: 0.0 standard drinks    Comment: social    Family History  Problem Relation Age of Onset   Colon cancer Father    Heart disease Mother    Heart disease Other    Arthritis Other    Cancer Other    Allergic rhinitis Neg Hx    Angioedema Neg Hx    Asthma Neg Hx    Atopy Neg Hx     Eczema Neg Hx    Immunodeficiency Neg Hx    Urticaria Neg Hx      Review of Systems  Cardiovascular:  Positive for chest pain and leg swelling.  Gastrointestinal:  Positive for constipation and diarrhea.  Genitourinary:  Positive for dysuria and frequency.  Musculoskeletal:  Positive for arthralgias.  All other systems reviewed and are negative.  Objective:  Physical Exam Constitutional:      General: She is not in acute distress.    Appearance: Normal appearance.  HENT:     Head: Normocephalic and atraumatic.  Eyes:     Extraocular Movements: Extraocular movements intact.     Pupils: Pupils are equal, round, and reactive to light.  Cardiovascular:     Rate and Rhythm: Normal rate and regular rhythm.     Pulses: Normal pulses.     Heart sounds: Normal heart sounds. No murmur heard. Pulmonary:     Effort: Pulmonary effort is normal. No respiratory distress.     Breath sounds: Normal breath sounds.  Abdominal:     General: Abdomen is flat. Bowel sounds are normal. There is no distension.     Palpations: Abdomen is soft.     Tenderness: There is no abdominal tenderness.  Musculoskeletal:     Cervical back: Normal range of motion and neck supple.     Comments: Examination of the right lower extremity again shows she is neurovascularly intact.  Good flexion and extension with the knee.  Stable to varus and valgus.  Joint line tenderness.  Mild swelling.  No significant effusion.  No warmth or erythema.  Lymphadenopathy:     Cervical: No cervical adenopathy.  Skin:    General: Skin is warm and dry.     Findings: No erythema or rash.  Neurological:     General: No focal deficit present.     Mental Status: She is alert and oriented to person, place, and time.  Psychiatric:        Mood  and Affect: Mood normal.        Behavior: Behavior normal.    Vital signs in last 24 hours: @VSRANGES @  Labs:   Estimated body mass index is 37.07 kg/m as calculated from the  following:   Height as of 10/03/21: 5\' 2"  (1.575 m).   Weight as of 10/03/21: 91.9 kg.   Imaging Review Plain radiographs demonstrate moderate degenerative joint disease of the right knee(s). The overall alignment ismild varus. The bone quality appears to be good for age and reported activity level.      Assessment/Plan:  End stage arthritis, right knee   The patient history, physical examination, clinical judgment of the provider and imaging studies are consistent with end stage degenerative joint disease of the right knee(s) and total knee arthroplasty is deemed medically necessary. The treatment options including medical management, injection therapy arthroscopy and arthroplasty were discussed at length. The risks and benefits of total knee arthroplasty were presented and reviewed. The risks due to aseptic loosening, infection, stiffness, patella tracking problems, thromboembolic complications and other imponderables were discussed. The patient acknowledged the explanation, agreed to proceed with the plan and consent was signed. Patient is being admitted for inpatient treatment for surgery, pain control, PT, OT, prophylactic antibiotics, VTE prophylaxis, progressive ambulation and ADL's and discharge planning. The patient is planning to be discharged  home with outpt PT.     Anticipated LOS equal to or greater than 2 midnights due to - Age 46 and older with one or more of the following:  - Obesity  - Expected need for hospital services (PT, OT, Nursing) required for safe  discharge  - Anticipated need for postoperative skilled nursing care or inpatient rehab  - Active co-morbidities: Coronary Artery Disease and Heart Attack OR   - Unanticipated findings during/Post Surgery: None  - Patient is a high risk of re-admission due to: None

## 2021-11-16 ENCOUNTER — Other Ambulatory Visit (HOSPITAL_COMMUNITY): Payer: Self-pay | Admitting: *Deleted

## 2021-11-16 NOTE — Patient Instructions (Signed)
DUE TO COVID-19 ONLY ONE VISITOR IS ALLOWED TO COME WITH YOU AND STAY IN THE WAITING ROOM ONLY DURING PRE OP AND PROCEDURE.   **NO VISITORS ARE ALLOWED IN THE SHORT STAY AREA OR RECOVERY ROOM!!**  IF YOU WILL BE ADMITTED INTO THE HOSPITAL YOU ARE ALLOWED ONLY TWO SUPPORT PEOPLE DURING VISITATION HOURS ONLY (7 AM -8PM)   The support person(s) must pass our screening, gel in and out, and wear a mask at all times, including in the patients room. Patients must also wear a mask when staff or their support person are in the room. Visitors GUEST BADGE MUST BE WORN VISIBLY  One adult visitor may remain with you overnight and MUST be in the room by 8 P.M.  No visitors under the age of 71. Any visitor under the age of 53 must be accompanied by an adult.     Your procedure is scheduled on: 11/25/21   Report to [facility] Main Entrance    Report to short stay at: 5:15 AM   Call this number if you have problems the morning of surgery [phone]   Do not eat food :After Midnight.   May have liquids until : 4:30 AM   day of surgery  CLEAR LIQUID DIET  Foods Allowed                                                                     Foods Excluded  Water, Black Coffee and tea, regular and decaf                             liquids that you cannot  Plain Jell-O in any flavor  (No red)                                           see through such as: Fruit ices (not with fruit pulp)                                     milk, soups, orange juice              Iced Popsicles (No red)                                    All solid food                                   Apple juices Sports drinks like Gatorade (No red) Lightly seasoned clear broth or consume(fat free) Sugar  Sample Menu Breakfast                                Lunch  Supper Cranberry juice                    Beef broth                            Chicken broth Jell-O                                      Grape juice                           Apple juice Coffee or tea                        Jell-O                                      Popsicle                                                Coffee or tea                        Coffee or tea      Complete one Ensure drink the morning of surgery at : 4:30 AM      the day of surgery.   The day of surgery:  Drink ONE (1) Pre-Surgery Clear Ensure or G2 by am the morning of surgery. Drink in one sitting. Do not sip.  This drink was given to you during your hospital  pre-op appointment visit. Nothing else to drink after completing the  Pre-Surgery Clear Ensure or G2.          If you have questions, please contact your surgeons office.     Oral Hygiene is also important to reduce your risk of infection.                                    Remember - BRUSH YOUR TEETH THE MORNING OF SURGERY WITH YOUR REGULAR TOOTHPASTE   Do NOT smoke after Midnight   Take these medicines the morning of surgery with A SIP OF WATER: isosorbide,buspirone,duloxetine,amlodipine,pantoprazole.  DO NOT TAKE ANY ORAL DIABETIC MEDICATIONS DAY OF YOUR SURGERY                              You may not have any metal on your body including hair pins, jewelry, and body piercing             Do not wear make-up, lotions, powders, perfumes/cologne, or deodorant  Do not wear nail polish including gel and S&S, artificial/acrylic nails, or any other type of covering on natural nails including finger and toenails. If you have artificial nails, gel coating, etc. that needs to be removed by a nail salon please have this removed prior to surgery or surgery may need to be canceled/ delayed if the surgeon/ anesthesia feels like they are unable to be safely monitored.   Do not shave  48 hours prior to surgery.  Do not bring valuables to the hospital. [place].   Contacts, dentures or bridgework may not be worn into  surgery.   Bring small overnight bag day of surgery.    Patients discharged on the day of surgery will not be allowed to drive home.   Special Instructions: Bring a copy of your healthcare power of attorney and living will documents         the day of surgery if you haven't scanned them before.              Please read over the following fact sheets you were given: IF YOU HAVE QUESTIONS ABOUT YOUR PRE-OP INSTRUCTIONS PLEASE CALL [phone]     [facility] - Preparing for Surgery Before surgery, you can play an important role.  Because skin is not sterile, your skin needs to be as free of germs as possible.  You can reduce the number of germs on your skin by washing with CHG (chlorahexidine gluconate) soap before surgery.  CHG is an antiseptic cleaner which kills germs and bonds with the skin to continue killing germs even after washing. Please DO NOT use if you have an allergy to CHG or antibacterial soaps.  If your skin becomes reddened/irritated stop using the CHG and inform your nurse when you arrive at Short Stay. Do not shave (including legs and underarms) for at least 48 hours prior to the first CHG shower.  You may shave your face/neck. Please follow these instructions carefully:  1.  Shower with CHG Soap the night before surgery and the  morning of Surgery.  2.  If you choose to wash your hair, wash your hair first as usual with your  normal  shampoo.  3.  After you shampoo, rinse your hair and body thoroughly to remove the  shampoo.                           4.  Use CHG as you would any other liquid soap.  You can apply chg directly  to the skin and wash                       Gently with a scrungie or clean washcloth.  5.  Apply the CHG Soap to your body ONLY FROM THE NECK DOWN.   Do not use on face/ open                           Wound or open sores. Avoid contact with eyes, ears mouth and genitals (private parts).                       Wash face,  Genitals (private parts) with your  normal soap.             6.  Wash thoroughly, paying special attention to the area where your surgery  will be performed.  7.  Thoroughly rinse your body with warm water from the neck down.  8.  DO NOT shower/wash with your normal soap after using and rinsing off  the CHG Soap.                9.  Pat yourself dry with a clean towel.  10.  Wear clean pajamas.            11.  Place clean sheets on your bed the night of your first shower and do not  sleep with pets. Day of Surgery : Do not apply any lotions/deodorants the morning of surgery.  Please wear clean clothes to the hospital/surgery center.  FAILURE TO FOLLOW THESE INSTRUCTIONS MAY RESULT IN THE CANCELLATION OF YOUR SURGERY PATIENT SIGNATURE_________________________________  NURSE SIGNATURE__________________________________  ________________________________________________________________________   Adam Phenix  An incentive spirometer is a tool that can help keep your lungs clear and active. This tool measures how well you are filling your lungs with each breath. Taking long deep breaths may help reverse or decrease the chance of developing breathing (pulmonary) problems (especially infection) following: A long period of time when you are unable to move or be active. BEFORE THE PROCEDURE  If the spirometer includes an indicator to show your best effort, your nurse or respiratory therapist will set it to a desired goal. If possible, sit up straight or lean slightly forward. Try not to slouch. Hold the incentive spirometer in an upright position. INSTRUCTIONS FOR USE  Sit on the edge of your bed if possible, or sit up as far as you can in bed or on a chair. Hold the incentive spirometer in an upright position. Breathe out normally. Place the mouthpiece in your mouth and seal your lips tightly around it. Breathe in slowly and as deeply as possible, raising the piston or the ball toward the top of the column. Hold  your breath for 3-5 seconds or for as long as possible. Allow the piston or ball to fall to the bottom of the column. Remove the mouthpiece from your mouth and breathe out normally. Rest for a few seconds and repeat Steps 1 through 7 at least 10 times every 1-2 hours when you are awake. Take your time and take a few normal breaths between deep breaths. The spirometer may include an indicator to show your best effort. Use the indicator as a goal to work toward during each repetition. After each set of 10 deep breaths, practice coughing to be sure your lungs are clear. If you have an incision (the cut made at the time of surgery), support your incision when coughing by placing a pillow or rolled up towels firmly against it. Once you are able to get out of bed, walk around indoors and cough well. You may stop using the incentive spirometer when instructed by your caregiver.  RISKS AND COMPLICATIONS Take your time so you do not get dizzy or light-headed. If you are in pain, you may need to take or ask for pain medication before doing incentive spirometry. It is harder to take a deep breath if you are having pain. AFTER USE Rest and breathe slowly and easily. It can be helpful to keep track of a log of your progress. Your caregiver can provide you with a simple table to help with this. If you are using the spirometer at home, follow these instructions: [place] IF:  You are having difficultly using the spirometer. You have trouble using the spirometer as often as instructed. Your pain medication is not giving enough relief while using the spirometer. You develop fever of 100.5 F (38.1 C) or higher. SEEK IMMEDIATE MEDICAL CARE IF:  You cough up bloody sputum that had not been present before. You develop fever of 102 F (38.9 C) or greater. You develop worsening pain at or near  the incision site. MAKE SURE YOU:  Understand these instructions. Will watch your condition. Will get help  right away if you are not doing well or get worse. Document Released: 03/26/2007 Document Revised: 02/05/2012 Document Reviewed: 05/27/2007 [facility] Patient Information 2014 [place], [place].   ________________________________________________________________________

## 2021-11-17 ENCOUNTER — Encounter (HOSPITAL_COMMUNITY)
Admission: RE | Admit: 2021-11-17 | Discharge: 2021-11-17 | Disposition: A | Discharge status: Home / Self Care | Payer: BC Managed Care – PPO | Source: Ambulatory Visit | Attending: Orthopedic Surgery | Admitting: Orthopedic Surgery

## 2021-11-17 ENCOUNTER — Encounter (HOSPITAL_COMMUNITY): Payer: Self-pay

## 2021-11-17 ENCOUNTER — Other Ambulatory Visit: Payer: Self-pay

## 2021-11-17 DIAGNOSIS — Z01818 Encounter for other preprocedural examination: Secondary | ICD-10-CM

## 2021-11-17 DIAGNOSIS — M1711 Unilateral primary osteoarthritis, right knee: Secondary | ICD-10-CM

## 2021-11-17 DIAGNOSIS — I10 Essential (primary) hypertension: Secondary | ICD-10-CM | POA: Diagnosis not present

## 2021-11-17 DIAGNOSIS — M25561 Pain in right knee: Secondary | ICD-10-CM | POA: Insufficient documentation

## 2021-11-17 DIAGNOSIS — I2510 Atherosclerotic heart disease of native coronary artery without angina pectoris: Secondary | ICD-10-CM | POA: Diagnosis not present

## 2021-11-17 DIAGNOSIS — G8929 Other chronic pain: Secondary | ICD-10-CM | POA: Diagnosis not present

## 2021-11-17 DIAGNOSIS — K219 Gastro-esophageal reflux disease without esophagitis: Secondary | ICD-10-CM | POA: Diagnosis not present

## 2021-11-17 DIAGNOSIS — I252 Old myocardial infarction: Secondary | ICD-10-CM | POA: Insufficient documentation

## 2021-11-17 DIAGNOSIS — Z87891 Personal history of nicotine dependence: Secondary | ICD-10-CM | POA: Diagnosis not present

## 2021-11-17 HISTORY — DX: Pneumonia, unspecified organism: J18.9

## 2021-11-17 HISTORY — DX: Malignant (primary) neoplasm, unspecified: C80.1

## 2021-11-17 LAB — COMPREHENSIVE METABOLIC PANEL
ALT: 20 U/L (ref 0–44)
AST: 22 U/L (ref 15–41)
Albumin: 4.3 g/dL (ref 3.5–5.0)
Alkaline Phosphatase: 83 U/L (ref 38–126)
Anion gap: 9 (ref 5–15)
BUN: 10 mg/dL (ref 8–23)
CO2: 27 mmol/L (ref 22–32)
Calcium: 9.5 mg/dL (ref 8.9–10.3)
Chloride: 101 mmol/L (ref 98–111)
Creatinine, Ser: 0.69 mg/dL (ref 0.44–1.00)
GFR, Estimated: >60 mL/min (ref >60)
Glucose, Bld: 108 mg/dL — ABNORMAL HIGH (ref 70–99)
Potassium: 3.8 mmol/L (ref 3.5–5.1)
Sodium: 137 mmol/L (ref 135–145)
Total Bilirubin: 0.6 mg/dL (ref 0.3–1.2)
Total Protein: 7.7 g/dL (ref 6.5–8.1)

## 2021-11-17 LAB — CBC WITH DIFFERENTIAL/PLATELET
Abs Immature Granulocytes: 0.02 10*3/uL (ref 0.00–0.07)
Basophils Absolute: 0.1 10*3/uL (ref 0.0–0.1)
Basophils Relative: 1 %
Eosinophils Absolute: 0.2 10*3/uL (ref 0.0–0.5)
Eosinophils Relative: 3 %
HCT: 41.4 % (ref 36.0–46.0)
Hemoglobin: 13.4 g/dL (ref 12.0–15.0)
Immature Granulocytes: 0 %
Lymphocytes Relative: 32 %
Lymphs Abs: 1.6 10*3/uL (ref 0.7–4.0)
MCH: 30.1 pg (ref 26.0–34.0)
MCHC: 32.4 g/dL (ref 30.0–36.0)
MCV: 93 fL (ref 80.0–100.0)
Monocytes Absolute: 0.7 10*3/uL (ref 0.1–1.0)
Monocytes Relative: 13 %
Neutro Abs: 2.6 10*3/uL (ref 1.7–7.7)
Neutrophils Relative %: 51 %
Platelets: 341 10*3/uL (ref 150–400)
RBC: 4.45 MIL/uL (ref 3.87–5.11)
RDW: 13.6 % (ref 11.5–15.5)
WBC: 5.1 10*3/uL (ref 4.0–10.5)
nRBC: 0 % (ref 0.0–0.2)

## 2021-11-17 LAB — URINALYSIS, ROUTINE W REFLEX MICROSCOPIC
Bilirubin Urine: NEGATIVE
Glucose, UA: NEGATIVE mg/dL
Hgb urine dipstick: NEGATIVE
Ketones, ur: NEGATIVE mg/dL
Leukocytes,Ua: NEGATIVE
Nitrite: NEGATIVE
Protein, ur: NEGATIVE mg/dL
Specific Gravity, Urine: 1.01 (ref 1.005–1.030)
pH: 7.5 (ref 5.0–8.0)

## 2021-11-17 LAB — SURGICAL PCR SCREEN
MRSA, PCR: POSITIVE — AB
Staphylococcus aureus: POSITIVE — AB

## 2021-11-17 NOTE — Progress Notes (Signed)
PCR: + MRSA, + STAPH. 

## 2021-11-17 NOTE — Progress Notes (Addendum)
COVID Vaccine Completed: NO Date COVID Vaccine completed: COVID vaccine manufacturer:  02/27/20 x 1  Moderna    COVID Test: N/A PCP - Lanelle Bal: [facility] Cardiologist - Dr. Rozann Lesches. Clearance: Almyra Deforest: PA: 09/30/21: EPIC  Chest x-ray -  EKG -  Stress Test -  ECHO -  Cardiac Cath - 07/03/17 Pacemaker/ICD device last checked:  Sleep Study -  CPAP -   Fasting Blood Sugar -  Checks Blood Sugar _____ times a day  Blood Thinner Instructions: Aspirin Instructions: Last Dose:  Anesthesia review: Hx: HTN,CAD,MI  Patient denies shortness of breath, fever, cough and chest pain at PAT appointment   Patient verbalized understanding of instructions that were given to them at the PAT appointment. Patient was also instructed that they will need to review over the PAT instructions again at home before surgery.

## 2021-11-18 LAB — URINE CULTURE: Culture: <10000 — AB

## 2021-11-18 NOTE — Anesthesia Preprocedure Evaluation (Addendum)
Anesthesia Evaluation  Patient identified by MRN, date of birth, ID band Patient awake    Reviewed: Allergy & Precautions, NPO status , Patient's Chart, lab work & pertinent test results  Airway Mallampati: II  TM Distance: >3 FB Neck ROM: Full    Dental no notable dental hx. (+) Teeth Intact, Dental Advisory Given   Pulmonary former smoker,    Pulmonary exam normal breath sounds clear to auscultation       Cardiovascular hypertension, Pt. on medications + CAD, + Past MI (NSTEMI 2012) and + Cardiac Stents (DES)  Normal cardiovascular exam Rhythm:Regular Rate:Normal  NL LV   Neuro/Psych Anxiety    GI/Hepatic Neg liver ROS, GERD  Medicated and Controlled,  Endo/Other  negative endocrine ROS  Renal/GU Lab Results      Component                Value               Date                      CREATININE               0.69                11/17/2021                 K                        3.8                 11/17/2021                        Musculoskeletal  (+) Arthritis , Osteoarthritis,  Fibromyalgia -  Abdominal (+) + obese (BMI 36.57),   Peds  Hematology Lab Results      Component                Value               Date                            HGB                      13.4                11/17/2021                HCT                      41.4                11/17/2021                     PLT                      341                 11/17/2021              Anesthesia Other Findings All: ancef , Cipro Sulfa  Reproductive/Obstetrics                           Anesthesia Physical Anesthesia Plan  ASA: 3  Anesthesia Plan: Spinal and Regional   Post-op Pain Management: Minimal or no pain anticipated and Regional block   Induction:   PONV Risk Score and Plan: 2 and Treatment may vary due to age or medical condition, Midazolam and Ondansetron  Airway Management Planned: Natural Airway and  Nasal Cannula  Additional Equipment: None  Intra-op Plan:   Post-operative Plan:   Informed Consent:     Dental advisory given  Plan Discussed with: CRNA and Anesthesiologist  Anesthesia Plan Comments: (See PAT note 11/17/2021, Konrad Felix Ward, PA-C  Spinal w R adductor canal block)      Anesthesia Quick Evaluation

## 2021-11-18 NOTE — Progress Notes (Signed)
Anesthesia Chart Review   Case: [id number] Date/Time: 11/25/21 0715   Procedure: TOTAL KNEE ARTHROPLASTY (Right: Knee)   Anesthesia type: Choice   Pre-op diagnosis: OA RIGHT KNEE   Location: [facility]   Surgeons: Earlie Server, MD       DISCUSSION:65 y.o. former smoker with h/o GERD, HTN, CAD (STEMI 2012), right knee OA scheduled for above procedure 11/25/2021 with dr. Earlie Server.   Per cardiology preoperative evaluation 10/04/2021, "Chart reviewed as part of pre-operative protocol coverage. Given past medical history and time since last visit, based on ACC/AHA guidelines, Tayana L Korman would be at acceptable risk for the planned procedure without further cardiovascular testing.    I discussed the case with Dr. Domenic Polite, per Dr. Domenic Polite, "I think if she has been clinically stable from a cardiac perspective on medical therapy, we probably do not gain very much by putting her through a Myoview.  Noninvasive imaging would almost certainly be abnormal in light of her known coronary anatomy."    The patient was advised that if she develops new symptoms prior to surgery to contact our office to arrange for a follow-up visit, and she verbalized understanding."  Anticipate pt can proceed with planned procedure barring acute status change.   VS: BP 132/79    Pulse 85    Temp 36.7 C (Oral)    Resp 18    Ht 5\' 2"  (1.575 m)    Wt 90.7 kg    SpO2 98%    BMI 36.58 kg/m   PROVIDERS: Lanelle Bal, PA-C is PCP   Rozann Lesches, MD is Cardiologist  LABS: Labs reviewed: Acceptable for surgery. (all labs ordered are listed, but only abnormal results are displayed)  Labs Reviewed  SURGICAL PCR SCREEN - Abnormal; Notable for the following components:      Result Value   MRSA, PCR POSITIVE (*)    Staphylococcus aureus POSITIVE (*)    All other components within normal limits  COMPREHENSIVE METABOLIC PANEL - Abnormal; Notable for the following components:   Glucose, Bld 108 (*)     All other components within normal limits  URINE CULTURE  CBC WITH DIFFERENTIAL/PLATELET  URINALYSIS, ROUTINE W REFLEX MICROSCOPIC  TYPE AND SCREEN     IMAGES:   EKG: 11/17/21 Rate 87 bpm  Normal sinus rhythm Cannot rule out Anterior infarct , age undetermined Abnormal ECG No significant change since last tracing  CV: Cardiac Cath 07/03/2017 Ost RCA to Prox RCA lesion, 40 %stenosed. Mid RCA to Dist RCA lesion, 100 %stenosed. Prox RCA to Mid RCA lesion, 80 %stenosed. Ost LAD to Prox LAD lesion, 30 %stenosed. The left ventricular systolic function is normal. LV end diastolic pressure is normal. The left ventricular ejection fraction is 55-65% by visual estimate. There is no mitral valve regurgitation.   1. Severe single vessel CAD  2. The RCA has diffuse mild disease prior to the proximal stent. The proximal to mid stented segment is patent with severe stenosis in the distal segment of the stent. The mid to distal vessel is occluded prior to the distal stent. The distal branches fill from left to right collaterals.  3. Mild non-obstructive disease in the proximal to mid LAD 4. Patent Circumflex with no obstructive disease.  5. Normal LV systolic function   Recommendations: Medical management of CAD for now. I have reviewed her case with Dr. Martinique and discussed options for potential CTO PCI. He feels that she would be a candidate for PCI of the  CTO if she has angina on optimal medical therapy.   Past Medical History:  Diagnosis Date   Acute ST elevation myocardial infarction (STEMI) of inferior wall ([place]) 2012   Anxiety    Arthritis    CAD (coronary artery disease)    DES to the proximal and distal RCA April 2012 in the setting of inferior STEMI; occluded mid to distal RCA with left-to-right collaterals 2018, managed medically   Cancer ([place])    Carotid stenosis    Less than 50% 8/11   Chronic low back pain    Depression    Diverticulitis    Diverticulosis     Essential hypertension    Fibromyalgia    GERD (gastroesophageal reflux disease) 03/02/2014   H/O hiatal hernia    History of kidney stones    Hyperlipidemia    Pneumonia    Urticaria     Past Surgical History:  Procedure Laterality Date   BIOPSY  10/18/2021   Procedure: BIOPSY;  Surgeon: Harvel Quale, MD;  [facility];  Service: Gastroenterology;;   CARDIAC SURGERY     stent placement   CHOLECYSTECTOMY     COLONOSCOPY N/A 04/01/2014   Procedure: COLONOSCOPY;  Surgeon: Rogene Houston, MD;  [facility];  Service: Endoscopy;  Laterality: N/A;  100   COLONOSCOPY WITH PROPOFOL N/A 10/18/2021   Procedure: COLONOSCOPY WITH PROPOFOL;  Surgeon: Harvel Quale, MD;  [facility];  Service: Gastroenterology;  Laterality: N/A;  8:30   Cyst removed from spine     ECTOPIC PREGNANCY SURGERY     ESOPHAGOGASTRODUODENOSCOPY N/A 04/01/2014   Procedure: ESOPHAGOGASTRODUODENOSCOPY (EGD);  Surgeon: Rogene Houston, MD;  [facility];  Service: Endoscopy;  Laterality: N/A;  100   ESOPHAGOGASTRODUODENOSCOPY N/A 11/03/2016   Procedure: ESOPHAGOGASTRODUODENOSCOPY (EGD);  Surgeon: Rogene Houston, MD;  [facility];  Service: Endoscopy;  Laterality: N/A;  2:45   ESOPHAGOGASTRODUODENOSCOPY (EGD) WITH PROPOFOL N/A 10/18/2021   Procedure: ESOPHAGOGASTRODUODENOSCOPY (EGD) WITH PROPOFOL;  Surgeon: Harvel Quale, MD;  [facility];  Service: Gastroenterology;  Laterality: N/A;   Heart stent     2012  x 2 stents   LEFT HEART CATH AND CORONARY ANGIOGRAPHY N/A 07/03/2017   Procedure: LEFT HEART CATH AND CORONARY ANGIOGRAPHY;  Surgeon: Burnell Blanks, MD;  Location: [facility];  Service: Cardiovascular;  Laterality: N/A;   Left knee     Arthroscopic   Left wrist     POLYPECTOMY  10/18/2021   Procedure: POLYPECTOMY;  Surgeon: Harvel Quale, MD;  [facility];  Service:  Gastroenterology;;   Azzie Almas DILATION  10/18/2021   Procedure: Azzie Almas DILATION;  Surgeon: Montez Morita, Quillian Quince, MD;  [facility];  Service: Gastroenterology;;   TONSILLECTOMY AND ADENOIDECTOMY     TOTAL KNEE ARTHROPLASTY Left 09/26/2013   Procedure: TOTAL KNEE ARTHROPLASTY- LEFT;  Surgeon: [name]., MD;  Location: [facility];  Service: Orthopedics;  Laterality: Left;   TUBAL LIGATION      MEDICATIONS:  acetaminophen (TYLENOL) 650 MG CR tablet   amLODipine (NORVASC) 5 MG tablet   antiseptic oral rinse (BIOTENE) LIQD   Ascorbic Acid (VITAMIN C) 1000 MG tablet   aspirin 81 MG tablet   busPIRone (BUSPAR) 5 MG tablet   Cholecalciferol (VITAMIN D) 50 MCG (2000 UT) tablet   cyclobenzaprine (FLEXERIL) 5 MG tablet   dicyclomine (BENTYL) 10 MG capsule   docusate sodium (COLACE) 100 MG capsule  DULoxetine (CYMBALTA) 60 MG capsule   HYDROcodone-acetaminophen (NORCO/VICODIN) 5-325 MG tablet   hydrocortisone 2.5 % cream   isosorbide mononitrate (IMDUR) 30 MG 24 hr tablet   LORazepam (ATIVAN) 0.5 MG tablet   losartan (COZAAR) 100 MG tablet   nitroGLYCERIN (NITROSTAT) 0.4 MG SL tablet   nystatin cream (MYCOSTATIN)   pantoprazole (PROTONIX) 40 MG tablet   polyethylene glycol (MIRALAX / GLYCOLAX) 17 g packet   REPATHA SURECLICK 810 MG/ML SOAJ   zinc gluconate 50 MG tablet   zolpidem (AMBIEN) 10 MG tablet   No current facility-administered medications for this encounter.    Konrad Felix Ward, PA-C [facility] Pre-Surgical Testing [phone]

## 2021-11-25 ENCOUNTER — Ambulatory Visit (HOSPITAL_COMMUNITY): Payer: BC Managed Care – PPO | Admitting: Physician Assistant

## 2021-11-25 ENCOUNTER — Encounter (HOSPITAL_COMMUNITY): Payer: Self-pay | Admitting: Orthopedic Surgery

## 2021-11-25 ENCOUNTER — Ambulatory Visit (HOSPITAL_COMMUNITY)
Admission: RE | Admit: 2021-11-25 | Discharge: 2021-11-25 | Disposition: A | Discharge status: Home / Self Care | Payer: BC Managed Care – PPO | Attending: Orthopedic Surgery | Admitting: Orthopedic Surgery

## 2021-11-25 ENCOUNTER — Ambulatory Visit (HOSPITAL_COMMUNITY): Payer: BC Managed Care – PPO | Admitting: Certified Registered Nurse Anesthetist

## 2021-11-25 ENCOUNTER — Encounter (HOSPITAL_COMMUNITY)
Admission: RE | Disposition: A | Discharge status: Home / Self Care | Payer: Self-pay | Source: Home / Self Care | Attending: Orthopedic Surgery

## 2021-11-25 DIAGNOSIS — R262 Difficulty in walking, not elsewhere classified: Secondary | ICD-10-CM | POA: Insufficient documentation

## 2021-11-25 DIAGNOSIS — M6281 Muscle weakness (generalized): Secondary | ICD-10-CM | POA: Insufficient documentation

## 2021-11-25 DIAGNOSIS — M21161 Varus deformity, not elsewhere classified, right knee: Secondary | ICD-10-CM | POA: Diagnosis not present

## 2021-11-25 DIAGNOSIS — Z87891 Personal history of nicotine dependence: Secondary | ICD-10-CM | POA: Insufficient documentation

## 2021-11-25 DIAGNOSIS — Z01818 Encounter for other preprocedural examination: Secondary | ICD-10-CM

## 2021-11-25 DIAGNOSIS — M1711 Unilateral primary osteoarthritis, right knee: Secondary | ICD-10-CM | POA: Diagnosis not present

## 2021-11-25 HISTORY — PX: TOTAL KNEE ARTHROPLASTY: SHX125

## 2021-11-25 LAB — TYPE AND SCREEN
ABO/RH(D): O NEG
Antibody Screen: NEGATIVE

## 2021-11-25 SURGERY — ARTHROPLASTY, KNEE, TOTAL
Anesthesia: Regional | Site: Knee | Laterality: Right

## 2021-11-25 MED ORDER — PROPOFOL 1000 MG/100ML IV EMUL
INTRAVENOUS | Status: AC
  Filled 2021-11-25: qty 100

## 2021-11-25 MED ORDER — ACETAMINOPHEN 500 MG PO TABS
1000.0000 mg | ORAL_TABLET | Freq: Once | ORAL | Status: AC
  Administered 2021-11-25: 06:00:00 1000 mg via ORAL
  Filled 2021-11-25: qty 2

## 2021-11-25 MED ORDER — DIAZEPAM 2 MG PO TABS: 2.0000 mg | ORAL_TABLET | Freq: Four times a day (QID) | ORAL | 0 refills | Status: DC | PRN

## 2021-11-25 MED ORDER — LACTATED RINGERS IV SOLN: INTRAVENOUS | Status: DC

## 2021-11-25 MED ORDER — SODIUM CHLORIDE 0.9 % IV SOLN: INTRAVENOUS | Status: DC

## 2021-11-25 MED ORDER — OXYCODONE HCL 5 MG/5ML PO SOLN: 5.0000 mg | Freq: Once | ORAL | Status: AC | PRN

## 2021-11-25 MED ORDER — BUPIVACAINE LIPOSOME 1.3 % IJ SUSP: 20.0000 mL | Freq: Once | INTRAMUSCULAR | Status: DC

## 2021-11-25 MED ORDER — MIDAZOLAM HCL 5 MG/5ML IJ SOLN
INTRAMUSCULAR | Status: DC | PRN
Start: 2021-11-25 — End: 2021-11-25
  Administered 2021-11-25 (×2): 1 mg via INTRAVENOUS

## 2021-11-25 MED ORDER — ACETAMINOPHEN 10 MG/ML IV SOLN: 1000.0000 mg | Freq: Once | INTRAVENOUS | Status: DC | PRN

## 2021-11-25 MED ORDER — SODIUM CHLORIDE 0.9 % IV SOLN
INTRAVENOUS | Status: DC | PRN
  Administered 2021-11-25: 09:00:00 100 mL

## 2021-11-25 MED ORDER — ONDANSETRON HCL 4 MG/2ML IJ SOLN: 4.0000 mg | Freq: Once | INTRAMUSCULAR | Status: DC | PRN

## 2021-11-25 MED ORDER — TRANEXAMIC ACID 1000 MG/10ML IV SOLN
2000.0000 mg | INTRAVENOUS | Status: DC
  Filled 2021-11-25: qty 20

## 2021-11-25 MED ORDER — OXYCODONE HCL 5 MG PO TABS
5.0000 mg | ORAL_TABLET | Freq: Once | ORAL | Status: AC | PRN
  Administered 2021-11-25: 11:00:00 5 mg via ORAL

## 2021-11-25 MED ORDER — DEXAMETHASONE SODIUM PHOSPHATE 10 MG/ML IJ SOLN
INTRAMUSCULAR | Status: AC
  Filled 2021-11-25: qty 1

## 2021-11-25 MED ORDER — WATER FOR IRRIGATION, STERILE IR SOLN
Status: DC | PRN
  Administered 2021-11-25: 2000 mL

## 2021-11-25 MED ORDER — LACTATED RINGERS IV BOLUS
500.0000 mL | Freq: Once | INTRAVENOUS | Status: AC
  Administered 2021-11-25: 11:00:00 500 mL via INTRAVENOUS

## 2021-11-25 MED ORDER — ASPIRIN 81 MG PO TABS: 81.0000 mg | ORAL_TABLET | Freq: Two times a day (BID) | ORAL | 0 refills | Status: DC

## 2021-11-25 MED ORDER — LACTATED RINGERS IV BOLUS
250.0000 mL | Freq: Once | INTRAVENOUS | Status: AC
  Administered 2021-11-25: 11:00:00 250 mL via INTRAVENOUS

## 2021-11-25 MED ORDER — FENTANYL CITRATE (PF) 100 MCG/2ML IJ SOLN
INTRAMUSCULAR | Status: DC | PRN
  Administered 2021-11-25 (×2): 50 ug via INTRAVENOUS

## 2021-11-25 MED ORDER — OXYCODONE HCL 5 MG PO TABS: ORAL_TABLET | ORAL | 0 refills | Status: DC

## 2021-11-25 MED ORDER — PROPOFOL 500 MG/50ML IV EMUL
INTRAVENOUS | Status: AC
  Filled 2021-11-25: qty 50

## 2021-11-25 MED ORDER — PROPOFOL 500 MG/50ML IV EMUL
INTRAVENOUS | Status: DC | PRN
  Administered 2021-11-25: 80 ug/kg/min via INTRAVENOUS

## 2021-11-25 MED ORDER — EPHEDRINE 5 MG/ML INJ
INTRAVENOUS | Status: AC
  Filled 2021-11-25: qty 5

## 2021-11-25 MED ORDER — SODIUM CHLORIDE 0.9 % IR SOLN
Status: DC | PRN
  Administered 2021-11-25: 1000 mL

## 2021-11-25 MED ORDER — OXYCODONE HCL 5 MG PO TABS
ORAL_TABLET | ORAL | Status: AC
  Filled 2021-11-25: qty 1

## 2021-11-25 MED ORDER — PHENYLEPHRINE HCL (PRESSORS) 10 MG/ML IV SOLN
INTRAVENOUS | Status: AC
  Filled 2021-11-25: qty 1

## 2021-11-25 MED ORDER — POVIDONE-IODINE 10 % EX SWAB
2.0000 "application " | Freq: Once | CUTANEOUS | Status: AC
  Administered 2021-11-25: 2 via TOPICAL

## 2021-11-25 MED ORDER — 0.9 % SODIUM CHLORIDE (POUR BTL) OPTIME
TOPICAL | Status: DC | PRN
  Administered 2021-11-25: 08:00:00 1000 mL

## 2021-11-25 MED ORDER — CLONIDINE HCL (ANALGESIA) 100 MCG/ML EP SOLN
EPIDURAL | Status: DC | PRN
  Administered 2021-11-25: 100 ug

## 2021-11-25 MED ORDER — HYDROMORPHONE HCL 1 MG/ML IJ SOLN: 0.2500 mg | INTRAMUSCULAR | Status: DC | PRN

## 2021-11-25 MED ORDER — VANCOMYCIN HCL IN DEXTROSE 1-5 GM/200ML-% IV SOLN
1000.0000 mg | INTRAVENOUS | Status: AC
  Administered 2021-11-25 (×2): 1000 mg via INTRAVENOUS
  Filled 2021-11-25: qty 200

## 2021-11-25 MED ORDER — BUPIVACAINE LIPOSOME 1.3 % IJ SUSP
INTRAMUSCULAR | Status: AC
  Filled 2021-11-25: qty 20

## 2021-11-25 MED ORDER — DEXAMETHASONE SODIUM PHOSPHATE 10 MG/ML IJ SOLN
INTRAMUSCULAR | Status: DC | PRN
  Administered 2021-11-25: 10 mg via INTRAVENOUS

## 2021-11-25 MED ORDER — ORAL CARE MOUTH RINSE: 15.0000 mL | Freq: Once | OROMUCOSAL | Status: AC

## 2021-11-25 MED ORDER — TRANEXAMIC ACID 1000 MG/10ML IV SOLN
INTRAVENOUS | Status: DC | PRN
  Administered 2021-11-25: 09:00:00 2000 mg via TOPICAL

## 2021-11-25 MED ORDER — MIDAZOLAM HCL 2 MG/2ML IJ SOLN
INTRAMUSCULAR | Status: AC
  Filled 2021-11-25: qty 2

## 2021-11-25 MED ORDER — BUPIVACAINE IN DEXTROSE 0.75-8.25 % IT SOLN
INTRATHECAL | Status: DC | PRN
  Administered 2021-11-25: 12.75 mg via INTRATHECAL

## 2021-11-25 MED ORDER — PHENYLEPHRINE HCL-NACL 20-0.9 MG/250ML-% IV SOLN
INTRAVENOUS | Status: DC | PRN
  Administered 2021-11-25: 75 ug/min via INTRAVENOUS

## 2021-11-25 MED ORDER — CHLORHEXIDINE GLUCONATE 0.12 % MT SOLN
15.0000 mL | Freq: Once | OROMUCOSAL | Status: AC
  Administered 2021-11-25: 06:00:00 15 mL via OROMUCOSAL

## 2021-11-25 MED ORDER — ACETAMINOPHEN 10 MG/ML IV SOLN
INTRAVENOUS | Status: AC
  Filled 2021-11-25: qty 100

## 2021-11-25 MED ORDER — TRANEXAMIC ACID-NACL 1000-0.7 MG/100ML-% IV SOLN
1000.0000 mg | INTRAVENOUS | Status: AC
  Administered 2021-11-25: 08:00:00 1000 mg via INTRAVENOUS
  Filled 2021-11-25: qty 100

## 2021-11-25 MED ORDER — PROPOFOL 10 MG/ML IV BOLUS
INTRAVENOUS | Status: DC | PRN
  Administered 2021-11-25: 40 mg via INTRAVENOUS

## 2021-11-25 MED ORDER — AMISULPRIDE (ANTIEMETIC) 5 MG/2ML IV SOLN: 10.0000 mg | Freq: Once | INTRAVENOUS | Status: DC | PRN

## 2021-11-25 MED ORDER — FENTANYL CITRATE (PF) 100 MCG/2ML IJ SOLN
INTRAMUSCULAR | Status: AC
  Filled 2021-11-25: qty 2

## 2021-11-25 MED ORDER — BUPIVACAINE-EPINEPHRINE (PF) 0.25% -1:200000 IJ SOLN
INTRAMUSCULAR | Status: AC
  Filled 2021-11-25: qty 30

## 2021-11-25 MED ORDER — ROPIVACAINE HCL 5 MG/ML IJ SOLN
INTRAMUSCULAR | Status: DC | PRN
  Administered 2021-11-25: 30 mL via PERINEURAL

## 2021-11-25 SURGICAL SUPPLY — 63 items
APL SKNCLS STERI-STRIP NONHPOA (GAUZE/BANDAGES/DRESSINGS) ×1
ATTUNE PS FEM RT SZ 3 CEM KNEE (Femur) ×1 IMPLANT
ATTUNE PSRP INSR SZ3 6 KNEE (Insert) ×1 IMPLANT
BAG COUNTER SPONGE SURGICOUNT (BAG) ×2 IMPLANT
BAG DECANTER FOR FLEXI CONT (MISCELLANEOUS) ×2 IMPLANT
BAG SPEC THK2 15X12 ZIP CLS (MISCELLANEOUS) ×1
BAG SPNG CNTER NS LX DISP (BAG) ×1
BAG ZIPLOCK 12X15 (MISCELLANEOUS) ×2 IMPLANT
BASEPLATE TIBIAL ROTATING SZ 4 (Knees) ×1 IMPLANT
BENZOIN TINCTURE PRP APPL 2/3 (GAUZE/BANDAGES/DRESSINGS) ×1 IMPLANT
BLADE SAGITTAL 25.0X1.19X90 (BLADE) ×2 IMPLANT
BLADE SAW SGTL 13X75X1.27 (BLADE) ×2 IMPLANT
BLADE SURG 15 STRL LF DISP TIS (BLADE) ×1 IMPLANT
BLADE SURG 15 STRL SS (BLADE) ×2
BLADE SURG SZ10 CARB STEEL (BLADE) ×4 IMPLANT
BNDG CMPR MED 15X6 ELC VLCR LF (GAUZE/BANDAGES/DRESSINGS) ×1
BNDG ELASTIC 6X15 VLCR STRL LF (GAUZE/BANDAGES/DRESSINGS) ×2 IMPLANT
BONE CEMENT GENTAMICIN (Cement) ×4 IMPLANT
BOWL SMART MIX CTS (DISPOSABLE) ×2 IMPLANT
BSPLAT TIB 4 CMNT ROT PLAT STR (Knees) ×1 IMPLANT
CEMENT BONE GENTAMICIN 40 (Cement) IMPLANT
CLSR STERI-STRIP ANTIMIC 1/2X4 (GAUZE/BANDAGES/DRESSINGS) ×4 IMPLANT
COVER SURGICAL LIGHT HANDLE (MISCELLANEOUS) ×2 IMPLANT
CUFF TOURN SGL QUICK 34 (TOURNIQUET CUFF) ×2
CUFF TRNQT CYL 34X4.125X (TOURNIQUET CUFF) ×1 IMPLANT
DECANTER SPIKE VIAL GLASS SM (MISCELLANEOUS) ×4 IMPLANT
DRAPE INCISE IOBAN 66X45 STRL (DRAPES) ×2 IMPLANT
DRAPE U-SHAPE 47X51 STRL (DRAPES) ×2 IMPLANT
DRESSING AQUACEL AG SP 3.5X10 (GAUZE/BANDAGES/DRESSINGS) ×1 IMPLANT
DRSG AQUACEL AG ADV 3.5X10 (GAUZE/BANDAGES/DRESSINGS) ×1 IMPLANT
DRSG AQUACEL AG SP 3.5X10 (GAUZE/BANDAGES/DRESSINGS) ×2
DURAPREP 26ML APPLICATOR (WOUND CARE) ×4 IMPLANT
ELECT REM PT RETURN 15FT ADLT (MISCELLANEOUS) ×2 IMPLANT
GLOVE SRG 8 PF TXTR STRL LF DI (GLOVE) ×2 IMPLANT
GLOVE SURG ORTHO LTX SZ8 (GLOVE) ×2 IMPLANT
GLOVE SURG POLYISO LF SZ7.5 (GLOVE) ×2 IMPLANT
GLOVE SURG UNDER POLY LF SZ8 (GLOVE) ×4
GOWN STRL REUS W/TWL XL LVL3 (GOWN DISPOSABLE) ×8 IMPLANT
HANDPIECE INTERPULSE COAX TIP (DISPOSABLE) ×2
HOLDER FOLEY CATH W/STRAP (MISCELLANEOUS) ×1 IMPLANT
HOOD PEEL AWAY FLYTE STAYCOOL (MISCELLANEOUS) ×2 IMPLANT
IMMOBILIZER KNEE 20 (SOFTGOODS) ×2
IMMOBILIZER KNEE 20 THIGH 36 (SOFTGOODS) ×1 IMPLANT
KIT TURNOVER KIT A (KITS) ×1 IMPLANT
MANIFOLD NEPTUNE II (INSTRUMENTS) ×2 IMPLANT
NEEDLE HYPO 22GX1.5 SAFETY (NEEDLE) ×4 IMPLANT
NS IRRIG 1000ML POUR BTL (IV SOLUTION) ×2 IMPLANT
PACK TOTAL KNEE CUSTOM (KITS) ×2 IMPLANT
PATELLA MEDIAL ATTUN 35MM KNEE (Knees) ×1 IMPLANT
PROTECTOR NERVE ULNAR (MISCELLANEOUS) ×2 IMPLANT
SET HNDPC FAN SPRY TIP SCT (DISPOSABLE) ×1 IMPLANT
SPONGE T-LAP 18X18 ~~LOC~~+RFID (SPONGE) ×6 IMPLANT
STRIP CLOSURE SKIN 1/2X4 (GAUZE/BANDAGES/DRESSINGS) ×1 IMPLANT
SUT ETHIBOND NAB CT1 #1 30IN (SUTURE) ×4 IMPLANT
SUT MNCRL AB 3-0 PS2 18 (SUTURE) ×2 IMPLANT
SUT VIC AB 0 CT1 36 (SUTURE) ×2 IMPLANT
SUT VIC AB 2-0 CT1 27 (SUTURE) ×4
SUT VIC AB 2-0 CT1 TAPERPNT 27 (SUTURE) ×2 IMPLANT
SYR CONTROL 10ML LL (SYRINGE) ×6 IMPLANT
TOWEL OR 17X26 10 PK STRL BLUE (TOWEL DISPOSABLE) ×2 IMPLANT
TRAY FOLEY MTR SLVR 16FR STAT (SET/KITS/TRAYS/PACK) ×2 IMPLANT
WATER STERILE IRR 1000ML POUR (IV SOLUTION) ×4 IMPLANT
WRAP KNEE MAXI GEL POST OP (GAUZE/BANDAGES/DRESSINGS) ×2 IMPLANT

## 2021-11-25 NOTE — Anesthesia Procedure Notes (Signed)
Anesthesia Regional Block: Adductor canal block   Pre-Anesthetic Checklist: , timeout performed,  Correct Patient, Correct Site, Correct Laterality,  Correct Procedure, Correct Position, site marked,  Risks and benefits discussed,  Surgical consent,  Pre-op evaluation,  At surgeon's request and post-op pain management  Laterality: Lower and Right  Prep: chloraprep       Needles:  Injection technique: Single-shot  Needle Type: Echogenic Needle     Needle Length: 9cm  Needle Gauge: 22     Additional Needles:   Procedures:,,,, ultrasound used (permanent image in chart),,    Narrative:  Start time: 11/25/2021 6:54 AM End time: 11/25/2021 7:00 AM Injection made incrementally with aspirations every 5 mL.  Performed by: Personally  Anesthesiologist: Barnet Glasgow, MD  Additional Notes: Block assessed prior to surgery. Pt tolerated procedure well.

## 2021-11-25 NOTE — Transfer of Care (Signed)
Immediate Anesthesia Transfer of Care Note  Patient: Daveah L Totaro  Procedure(s) Performed: TOTAL KNEE ARTHROPLASTY (Right: Knee)  Patient Location: PACU  Anesthesia Type:Spinal  Level of Consciousness: awake, alert  and oriented  Airway & Oxygen Therapy: Patient Spontanous Breathing and Patient connected to face mask oxygen  Post-op Assessment: Report given to RN and Post -op Vital signs reviewed and stable  Post vital signs: Reviewed and stable  Last Vitals:  Vitals Value Taken Time  BP 116/81 11/25/21 1002  Temp    Pulse 82 11/25/21 1006  Resp 14 11/25/21 1006  SpO2 99 % 11/25/21 1006  Vitals shown include unvalidated device data.  Last Pain:  Vitals:   11/25/21 0559  TempSrc:   PainSc: 7       Patients Stated Pain Goal: 6 (09/64/38 3818)  Complications: No notable events documented.

## 2021-11-25 NOTE — Progress Notes (Signed)
Orthopedic Tech Progress Note Patient Details:  AVERYANNA SAX [birth year] [id number]  Ortho Devices Type of Ortho Device: Bone foam zero knee Ortho Device/Splint Location: Right knee Ortho Device/Splint Interventions: Ordered      Nysir Fergusson E Nathalia Wismer 11/25/2021, 10:41 AM

## 2021-11-25 NOTE — Brief Op Note (Signed)
11/25/2021  10:08 AM  PATIENT:  [name]  65 y.o. female  PRE-OPERATIVE DIAGNOSIS:  OA RIGHT KNEE  POST-OPERATIVE DIAGNOSIS:  * No post-op diagnosis entered *  PROCEDURE:  Procedure(s): TOTAL KNEE ARTHROPLASTY (Right)  SURGEON:  Surgeon(s) and Role:    * Earlie Server, MD - Primary  PHYSICIAN ASSISTANT: Chriss Czar, PA-C  ASSISTANTS: OR staff x1   ANESTHESIA:   local, regional, spinal, and IV sedation  EBL:  100 mL   BLOOD ADMINISTERED:none  DRAINS: none   LOCAL MEDICATIONS USED:  MARCAINE     SPECIMEN:  No Specimen  DISPOSITION OF SPECIMEN:  N/A  COUNTS:  YES  TOURNIQUET:   Total Tourniquet Time Documented: Thigh (Right) - 60 minutes Total: Thigh (Right) - 60 minutes   DICTATION: .Other Dictation: Dictation Number unknown  PLAN OF CARE: Discharge to home after PACU  PATIENT DISPOSITION:  PACU - hemodynamically stable.   Delay start of Pharmacological VTE agent (>24hrs) due to surgical blood loss or risk of bleeding: yes

## 2021-11-25 NOTE — Anesthesia Postprocedure Evaluation (Signed)
Anesthesia Post Note  Patient: [name]  Procedure(s) Performed: TOTAL KNEE ARTHROPLASTY (Right: Knee)     Patient location during evaluation: Nursing Unit Anesthesia Type: Regional and Spinal Level of consciousness: oriented and awake and alert Pain management: pain level controlled Vital Signs Assessment: post-procedure vital signs reviewed and stable Respiratory status: spontaneous breathing and respiratory function stable Cardiovascular status: blood pressure returned to baseline and stable Postop Assessment: no headache, no backache, no apparent nausea or vomiting and patient able to bend at knees Anesthetic complications: no   No notable events documented.  Last Vitals:  Vitals:   11/25/21 1103 11/25/21 1200  BP: 125/83 (!) 154/86  Pulse: 85 94  Resp:  18  Temp:    SpO2: 96% 97%    Last Pain:  Vitals:   11/25/21 1200  TempSrc:   PainSc: 0-No pain                 Barnet Glasgow

## 2021-11-25 NOTE — Evaluation (Signed)
Physical Therapy Evaluation Patient Details Name: [name] MRN: [id number] DOB: [birth year] Today's Date: 11/25/2021  History of Present Illness  65 y.o. female presented 11/25/32 for R TKA. PMH includes L TKA 2014, obesity, anxiety, MI, fibromyalgia.  Clinical Impression  Pt is mobilizing well, she ambulated 23' with RW, completed stair training, and demonstrates good understanding of HEP. She is ready to DC home from a PT standpoint.         Recommendations for follow up therapy are one component of a multi-disciplinary discharge planning process, led by the attending physician.  Recommendations may be updated based on patient status, additional functional criteria and insurance authorization.  Follow Up Recommendations Outpatient PT    Assistance Recommended at Discharge Intermittent Supervision/Assistance  Functional Status Assessment Patient has had a recent decline in their functional status and demonstrates the ability to make significant improvements in function in a reasonable and predictable amount of time.  Equipment Recommendations  None recommended by PT    Recommendations for Other Services       Precautions / Restrictions Precautions Precautions: Knee Precaution Booklet Issued: Yes (comment) Precaution Comments: reviewed no pillow under knee Restrictions Weight Bearing Restrictions: No Other Position/Activity Restrictions: WBAT      Mobility  Bed Mobility Overal bed mobility: Modified Independent             General bed mobility comments: HOB up, used rail    Transfers Overall transfer level: Needs assistance Equipment used: Rolling walker (2 wheels) Transfers: Sit to/from Stand Sit to Stand: Min guard           General transfer comment: VCs hand placement, no physical assist needed    Ambulation/Gait Ambulation/Gait assistance: Supervision Gait Distance (Feet): 130 Feet Assistive device: Rolling walker (2 wheels) Gait  Pattern/deviations: Step-to pattern Gait velocity: decr     General Gait Details: VCs sequencing, no loss of balance, no buckling of RLE  Stairs Stairs: Yes Stairs assistance: Supervision Stair Management: One rail Right;Step to pattern;Forwards Number of Stairs: 3 General stair comments: VCs sequencing  Wheelchair Mobility    Modified Rankin (Stroke Patients Only)       Balance Overall balance assessment: Modified Independent                                           Pertinent Vitals/Pain Pain Assessment: 0-10 Pain Score: 8  Pain Location: R knee Pain Descriptors / Indicators: Sore Pain Intervention(s): Limited activity within patient's tolerance;Monitored during session;Premedicated before session;Repositioned;Ice applied;Patient requesting pain meds-RN notified    Home Living Family/patient expects to be discharged to:: Private residence Living Arrangements: Spouse/significant other Available Help at Discharge: Family;Available 24 hours/day Type of Home: House Home Access: Stairs to enter Entrance Stairs-Rails: Right Entrance Stairs-Number of Steps: 2   Home Layout: One level Home Equipment: Conservation officer, nature (2 wheels);Cane - single point;BSC/3in1      Prior Function Prior Level of Function : Independent/Modified Independent             Mobility Comments: walked with SPC, no falls in past 6 months       Hand Dominance        Extremity/Trunk Assessment   Upper Extremity Assessment Upper Extremity Assessment: Overall WFL for tasks assessed    Lower Extremity Assessment Lower Extremity Assessment: RLE deficits/detail RLE Deficits / Details: SLR 3/5, knee AAROM 0-65* RLE Sensation: WNL RLE Coordination: WNL  Cervical / Trunk Assessment Cervical / Trunk Assessment: Normal  Communication   Communication: No difficulties  Cognition Arousal/Alertness: Awake/alert Behavior During Therapy: WFL for tasks  assessed/performed Overall Cognitive Status: Within Functional Limits for tasks assessed                                          General Comments      Exercises Total Joint Exercises Ankle Circles/Pumps: AROM;Both;10 reps;Supine Quad Sets: AROM;Right;5 reps;Supine Short Arc Quad: AROM;Right;5 reps;Supine Heel Slides: AAROM;Right;10 reps;Supine Hip ABduction/ADduction: AAROM;Right;10 reps;Supine Straight Leg Raises: AAROM;Right;5 reps;Supine Long Arc Quad: AROM;Right;5 reps;Seated Knee Flexion: AAROM;Right;10 reps;Seated Goniometric ROM: 0-65* AAROM knee   Assessment/Plan    PT Assessment All further PT needs can be met in the next venue of care  PT Problem List Decreased strength;Decreased mobility;Decreased range of motion;Decreased activity tolerance       PT Treatment Interventions      PT Goals (Current goals can be found in the Care Plan section)  Acute Rehab PT Goals Patient Stated Goal: go camping PT Goal Formulation: All assessment and education complete, DC therapy    Frequency     Barriers to discharge        Co-evaluation               AM-PAC PT "6 Clicks" Mobility  Outcome Measure Help needed turning from your back to your side while in a flat bed without using bedrails?: A Little Help needed moving from lying on your back to sitting on the side of a flat bed without using bedrails?: A Little Help needed moving to and from a bed to a chair (including a wheelchair)?: A Little Help needed standing up from a chair using your arms (e.g., wheelchair or bedside chair)?: A Little Help needed to walk in hospital room?: A Little Help needed climbing 3-5 steps with a railing? : A Little 6 Click Score: 18    End of Session Equipment Utilized During Treatment: Gait belt Activity Tolerance: Patient tolerated treatment well Patient left: in chair;with call bell/phone within reach Nurse Communication: Mobility status PT Visit Diagnosis:  Pain;Muscle weakness (generalized) (M62.81);Difficulty in walking, not elsewhere classified (R26.2) Pain - Right/Left: Right Pain - part of body: Knee    Time: 1203-1240 PT Time Calculation (min) (ACUTE ONLY): 37 min   Charges:   PT Evaluation $PT Eval Moderate Complexity: 1 Mod PT Treatments $Gait Training: 8-22 mins       Blondell Reveal Kistler PT 11/25/2021  Acute Rehabilitation Services Pager [phone] Office [phone]

## 2021-11-25 NOTE — Anesthesia Procedure Notes (Signed)
Spinal  Patient location during procedure: OR Start time: 11/25/2021 7:47 AM End time: 11/25/2021 7:50 AM Reason for block: surgical anesthesia Staffing Performed: anesthesiologist  Anesthesiologist: Barnet Glasgow, MD Preanesthetic Checklist Completed: patient identified, IV checked, risks and benefits discussed, surgical consent, monitors and equipment checked, pre-op evaluation and timeout performed Spinal Block Patient position: sitting Prep: DuraPrep and site prepped and draped Patient monitoring: heart rate, cardiac monitor, continuous pulse ox and blood pressure Approach: midline Location: L3-4 Injection technique: single-shot Needle Needle type: Pencan  Needle gauge: 24 G Needle length: 10 cm Needle insertion depth: 6 cm Assessment Sensory level: T4 Events: CSF return Additional Notes  1 Attempt (s). Pt tolerated procedure well.

## 2021-11-25 NOTE — Interval H&P Note (Signed)
History and Physical Interval Note:  11/25/2021 7:33 AM  [name]  has presented today for surgery, with the diagnosis of OA RIGHT KNEE.  The various methods of treatment have been discussed with the patient and family. After consideration of risks, benefits and other options for treatment, the patient has consented to  Procedure(s): TOTAL KNEE ARTHROPLASTY (Right) as a surgical intervention.  The patient's history has been reviewed, patient examined, no change in status, stable for surgery.  I have reviewed the patient's chart and labs.  Questions were answered to the patient's satisfaction.     [name]

## 2021-11-25 NOTE — Discharge Instructions (Signed)

## 2021-11-29 ENCOUNTER — Encounter (HOSPITAL_COMMUNITY): Payer: Self-pay | Admitting: Orthopedic Surgery

## 2021-11-29 NOTE — Op Note (Signed)
NAME: [name], [name] MEDICAL RECORD NO: [id number] ACCOUNT NO: [id number] DATE OF BIRTH: 1956/09/29 FACILITY: [facility] LOCATION: [facility] PHYSICIAN: W D. Valeta Harms., MD  Operative Report   DATE OF PROCEDURE: 11/25/2021  PREOPERATIVE DIAGNOSIS:  Severe osteoarthritis, right knee with varus deformity.  POSTOPERATIVE DIAGNOSIS:  Severe osteoarthritis, right knee with varus deformity.  PROCEDURE:  Right total knee replacement (Attune cemented total knee, size 3 femur, 6 mm size 3 tibial-bearing with size 4 tibia and 35 mm all poly patella).  SURGEON:  W D. Valeta Harms., MD.  Terrence DupontMarjo Bicker.  ANESTHESIA:  Spinal with block.  TOURNIQUET TIME:  59 minutes.  DESCRIPTION OF THE PROCEDURE:  Supine position.  Exsanguination of the leg, inflation of the thigh tourniquet to 350 mmHg.  Straight skin incision was made with a medial parapatellar approach to the knee.  We did a 5-degree 10 mm valgus cut on the femur  followed by cutting about 3 mm below the most diseased medial compartment with the extension gap, being measured at 6 mm.  Femur was sized to be a size 3.  Placement of the all-in-1 cutting block in the appropriate degree of external rotation,  accomplishing the anterior, posterior and chamfer cuts.  Excess bone was removed from the posterior aspect of the knee with release of the PCL.  Tibia was sized to be a size 4 and the keeled baseplate placed on the tibia.  Patella was moderately thin.   We only resected 7.5 mm of patella.  All trials were placed.  The patient obtained full extension with good stability to varus valgus.  The bearing tracked well with no tendency for bearing spinout.  Final components were inserted with  antibiotic-impregnated cement tibia followed by femur, patella.  Cement was allowed to harden with the trial bearing.  Trial bearing was removed.  Excess cement was removed from the posterior aspect of the knee.  Tourniquet was released.  There was no  excessive  bleeding noted.  Small bleeders were coagulated.  Final bearing was placed.  Closure was affected with #1 Ethibond, 2-0 Vicryl, and Monocryl in the skin.  Taken to recovery room in stable condition.   PUS D: 11/25/2021 9:31:26 am T: 11/25/2021 11:13:00 am  JOB: [id number]/ [id number]

## 2022-01-03 ENCOUNTER — Telehealth: Payer: Self-pay | Admitting: Cardiology

## 2022-01-03 ENCOUNTER — Other Ambulatory Visit: Payer: Self-pay | Admitting: Cardiology

## 2022-01-03 NOTE — Telephone Encounter (Signed)
°*  STAT* If patient is at the pharmacy, call can be transferred to refill team.   1. Which medications need to be refilled? (please list name of each medication and dose if known)  REPATHA SURECLICK 751 MG/ML SOAJ  2. Which pharmacy/location (including street and city if local pharmacy) is medication to be sent to?  [facility] [place] [address]   3. Do they need a 30 day or 90 day supply?  90 day supply

## 2022-01-03 NOTE — Telephone Encounter (Signed)
Already done

## 2022-03-02 ENCOUNTER — Ambulatory Visit: Payer: BC Managed Care – PPO | Admitting: Cardiology

## 2022-04-08 ENCOUNTER — Other Ambulatory Visit: Payer: Self-pay | Admitting: Cardiology

## 2022-04-17 ENCOUNTER — Other Ambulatory Visit: Payer: Self-pay

## 2022-04-17 MED ORDER — LOSARTAN POTASSIUM 100 MG PO TABS
100.0000 mg | ORAL_TABLET | Freq: Every day | ORAL | 0 refills | Status: DC
Start: 2022-04-17 — End: 2022-07-14

## 2022-05-08 ENCOUNTER — Other Ambulatory Visit: Payer: Self-pay | Admitting: Orthopedic Surgery

## 2022-05-08 ENCOUNTER — Other Ambulatory Visit (HOSPITAL_COMMUNITY): Payer: Self-pay | Admitting: Orthopedic Surgery

## 2022-05-08 DIAGNOSIS — M545 Low back pain, unspecified: Secondary | ICD-10-CM

## 2022-05-10 ENCOUNTER — Telehealth: Payer: Self-pay | Admitting: Cardiology

## 2022-05-10 MED ORDER — NITROGLYCERIN 0.4 MG SL SUBL: 0.4000 mg | SUBLINGUAL_TABLET | SUBLINGUAL | 3 refills | Status: DC | PRN

## 2022-05-10 NOTE — Telephone Encounter (Signed)
Rx sent to pharmacy   

## 2022-05-10 NOTE — Telephone Encounter (Signed)
*  STAT* If patient is at the pharmacy, call can be transferred to refill team.   1. Which medications need to be refilled? (please list name of each medication and dose if known) nitroGLYCERIN (NITROSTAT) 0.4 MG SL tablet  2. Which pharmacy/location (including street and city if local pharmacy) is medication to be sent to? [facility] [place] [address]  3. Do they need a 30 day or 90 day supply? 30 day

## 2022-05-22 ENCOUNTER — Ambulatory Visit (HOSPITAL_COMMUNITY)
Admission: RE | Admit: 2022-05-22 | Discharge: 2022-05-22 | Disposition: A | Discharge status: Home / Self Care | Payer: BC Managed Care – PPO | Source: Ambulatory Visit | Attending: Orthopedic Surgery | Admitting: Orthopedic Surgery

## 2022-05-22 DIAGNOSIS — M545 Low back pain, unspecified: Secondary | ICD-10-CM | POA: Diagnosis present

## 2022-06-08 ENCOUNTER — Other Ambulatory Visit: Payer: Self-pay

## 2022-06-08 MED ORDER — AMLODIPINE BESYLATE 2.5 MG PO TABS: 2.5000 mg | ORAL_TABLET | Freq: Every day | ORAL | 0 refills | Status: DC

## 2022-06-08 NOTE — Telephone Encounter (Signed)
Previously sent in refill for 30 day. Patient is requesting 90 days supply. 90 day supply sent to [place], [place].

## 2022-06-10 ENCOUNTER — Other Ambulatory Visit: Payer: Self-pay

## 2022-06-10 ENCOUNTER — Emergency Department (HOSPITAL_COMMUNITY)
Admission: EM | Admit: 2022-06-10 | Discharge: 2022-06-10 | Disposition: A | Discharge status: Home / Self Care | Payer: BC Managed Care – PPO | Attending: Emergency Medicine | Admitting: Emergency Medicine

## 2022-06-10 ENCOUNTER — Encounter (HOSPITAL_COMMUNITY): Payer: Self-pay

## 2022-06-10 ENCOUNTER — Emergency Department (HOSPITAL_COMMUNITY): Payer: BC Managed Care – PPO

## 2022-06-10 DIAGNOSIS — R1084 Generalized abdominal pain: Secondary | ICD-10-CM | POA: Insufficient documentation

## 2022-06-10 DIAGNOSIS — Z79899 Other long term (current) drug therapy: Secondary | ICD-10-CM | POA: Insufficient documentation

## 2022-06-10 DIAGNOSIS — Z7982 Long term (current) use of aspirin: Secondary | ICD-10-CM | POA: Diagnosis not present

## 2022-06-10 DIAGNOSIS — I2510 Atherosclerotic heart disease of native coronary artery without angina pectoris: Secondary | ICD-10-CM | POA: Insufficient documentation

## 2022-06-10 DIAGNOSIS — M545 Low back pain, unspecified: Secondary | ICD-10-CM | POA: Insufficient documentation

## 2022-06-10 DIAGNOSIS — I10 Essential (primary) hypertension: Secondary | ICD-10-CM | POA: Diagnosis not present

## 2022-06-10 LAB — LIPASE, BLOOD: Lipase: 28 U/L (ref 11–51)

## 2022-06-10 LAB — COMPREHENSIVE METABOLIC PANEL
ALT: 24 U/L (ref 0–44)
AST: 18 U/L (ref 15–41)
Albumin: 4 g/dL (ref 3.5–5.0)
Alkaline Phosphatase: 81 U/L (ref 38–126)
Anion gap: 7 (ref 5–15)
BUN: 17 mg/dL (ref 8–23)
CO2: 28 mmol/L (ref 22–32)
Calcium: 9.2 mg/dL (ref 8.9–10.3)
Chloride: 103 mmol/L (ref 98–111)
Creatinine, Ser: 0.8 mg/dL (ref 0.44–1.00)
GFR, Estimated: >60 mL/min (ref >60)
Glucose, Bld: 98 mg/dL (ref 70–99)
Potassium: 3.8 mmol/L (ref 3.5–5.1)
Sodium: 138 mmol/L (ref 135–145)
Total Bilirubin: 0.9 mg/dL (ref 0.3–1.2)
Total Protein: 7.3 g/dL (ref 6.5–8.1)

## 2022-06-10 LAB — CBC WITH DIFFERENTIAL/PLATELET
Abs Immature Granulocytes: 0.03 10*3/uL (ref 0.00–0.07)
Basophils Absolute: 0.1 10*3/uL (ref 0.0–0.1)
Basophils Relative: 1 %
Eosinophils Absolute: 0.2 10*3/uL (ref 0.0–0.5)
Eosinophils Relative: 2 %
HCT: 42.5 % (ref 36.0–46.0)
Hemoglobin: 14 g/dL (ref 12.0–15.0)
Immature Granulocytes: 0 %
Lymphocytes Relative: 25 %
Lymphs Abs: 2.6 10*3/uL (ref 0.7–4.0)
MCH: 31.1 pg (ref 26.0–34.0)
MCHC: 32.9 g/dL (ref 30.0–36.0)
MCV: 94.4 fL (ref 80.0–100.0)
Monocytes Absolute: 1.1 10*3/uL — ABNORMAL HIGH (ref 0.1–1.0)
Monocytes Relative: 10 %
Neutro Abs: 6.6 10*3/uL (ref 1.7–7.7)
Neutrophils Relative %: 62 %
Platelets: 296 10*3/uL (ref 150–400)
RBC: 4.5 MIL/uL (ref 3.87–5.11)
RDW: 14.5 % (ref 11.5–15.5)
WBC: 10.5 10*3/uL (ref 4.0–10.5)
nRBC: 0 % (ref 0.0–0.2)

## 2022-06-10 MED ORDER — FENTANYL CITRATE PF 50 MCG/ML IJ SOSY
50.0000 ug | PREFILLED_SYRINGE | Freq: Once | INTRAMUSCULAR | Status: AC
  Administered 2022-06-10: 50 ug via INTRAVENOUS
  Filled 2022-06-10: qty 1

## 2022-06-10 MED ORDER — IOHEXOL 300 MG/ML  SOLN
100.0000 mL | Freq: Once | INTRAMUSCULAR | Status: AC | PRN
  Administered 2022-06-10: 100 mL via INTRAVENOUS

## 2022-06-10 MED ORDER — ONDANSETRON HCL 4 MG/2ML IJ SOLN
4.0000 mg | Freq: Once | INTRAMUSCULAR | Status: AC
  Administered 2022-06-10: 4 mg via INTRAVENOUS
  Filled 2022-06-10: qty 2

## 2022-06-10 MED ORDER — SODIUM CHLORIDE 0.9 % IV BOLUS
1000.0000 mL | Freq: Once | INTRAVENOUS | Status: AC
  Administered 2022-06-10: 1000 mL via INTRAVENOUS

## 2022-06-10 MED ORDER — KETOROLAC TROMETHAMINE 15 MG/ML IJ SOLN
15.0000 mg | Freq: Once | INTRAMUSCULAR | Status: AC
  Administered 2022-06-10: 15 mg via INTRAVENOUS
  Filled 2022-06-10: qty 1

## 2022-06-10 MED ORDER — METHOCARBAMOL 500 MG PO TABS
500.0000 mg | ORAL_TABLET | Freq: Once | ORAL | Status: AC
  Administered 2022-06-10: 500 mg via ORAL
  Filled 2022-06-10: qty 1

## 2022-06-10 NOTE — ED Triage Notes (Addendum)
Pt presents with severe R lumbar pain without radiation. Pain suddenly worsened today. Pt has been seeing ortho and had an MRI a couple weeks ago. Pt had physical therapy on 7/12. Pt has been taking APAP and gabapentin without relief.

## 2022-06-10 NOTE — Discharge Instructions (Signed)
Please continue to follow-up with your spine doctor.  Also recommend following with your primary care provider.

## 2022-06-10 NOTE — ED Provider Notes (Signed)
Accepted handoff at shift change from [facility]. Please see prior provider note for more detail.   Briefly: Patient is 66 y.o.   "[name] is a tearful 66 y.o. female with history of lumbar DDD, CAD, HTN, hyperlipidemia, anxiety, chronic tobacco use, GERD, diverticulosis, IBS.  Presenting today with sudden increase of right-sided back/abdominal pain.  Currently being evaluated/worked up for worsening right-sided back pain and radiculopathy.  Recent MRI demonstrated lumbar DDD with some stenosis.  This morning without any direct mechanism/injury, felt sudden severe increase in pain more specifically localized to the right back, side, and abdomen.  States it is too painful to move or think.  States this feels extremely similar to when she had diverticulitis in the past.  Has not passed stool in the last 24 hours, which is unusual for her.  Denies fevers, hemoptysis, Hx of DVT, IV drug use.  Without N/V/D, numbness or tingling of extremities, weakness, headache, vision changes, chest pain, shortness of breath.   The history is provided by the patient and medical records."   Plan: follow up on UA    Physical Exam  BP 139/89 (BP Location: Left Arm)   Pulse 88   Temp 98.1 F (36.7 C) (Oral)   Resp 18   Ht '5\' 2"'$  (1.575 m)   Wt 88.9 kg   SpO2 95%   BMI 35.85 kg/m   Physical Exam Vitals and nursing note reviewed.  Constitutional:      General: She is not in acute distress.    Appearance: Normal appearance. She is not ill-appearing.  HENT:     Head: Normocephalic and atraumatic.  Eyes:     General: No scleral icterus.       Right eye: No discharge.        Left eye: No discharge.     Conjunctiva/sclera: Conjunctivae normal.  Pulmonary:     Effort: Pulmonary effort is normal.     Breath sounds: No stridor.  Musculoskeletal:     Comments: There is right-sided paralumbar muscular tenderness.  It is focal and seems to be representing a trigger point.  Skin:    Comments: No  rashes to back  Neurological:     Mental Status: She is alert and oriented to person, place, and time. Mental status is at baseline.     Procedures  Procedures Results for orders placed or performed during the hospital encounter of 06/10/22  CBC with Differential  Result Value Ref Range   WBC 10.5 4.0 - 10.5 K/uL   RBC 4.50 3.87 - 5.11 MIL/uL   Hemoglobin 14.0 12.0 - 15.0 g/dL   HCT 42.5 36.0 - 46.0 %   MCV 94.4 80.0 - 100.0 fL   MCH 31.1 26.0 - 34.0 pg   MCHC 32.9 30.0 - 36.0 g/dL   RDW 14.5 11.5 - 15.5 %   Platelets 296 150 - 400 K/uL   nRBC 0.0 0.0 - 0.2 %   Neutrophils Relative % 62 %   Neutro Abs 6.6 1.7 - 7.7 K/uL   Lymphocytes Relative 25 %   Lymphs Abs 2.6 0.7 - 4.0 K/uL   Monocytes Relative 10 %   Monocytes Absolute 1.1 (H) 0.1 - 1.0 K/uL   Eosinophils Relative 2 %   Eosinophils Absolute 0.2 0.0 - 0.5 K/uL   Basophils Relative 1 %   Basophils Absolute 0.1 0.0 - 0.1 K/uL   Immature Granulocytes 0 %   Abs Immature Granulocytes 0.03 0.00 - 0.07 K/uL  Comprehensive  metabolic panel  Result Value Ref Range   Sodium 138 135 - 145 mmol/L   Potassium 3.8 3.5 - 5.1 mmol/L   Chloride 103 98 - 111 mmol/L   CO2 28 22 - 32 mmol/L   Glucose, Bld 98 70 - 99 mg/dL   BUN 17 8 - 23 mg/dL   Creatinine, Ser 0.80 0.44 - 1.00 mg/dL   Calcium 9.2 8.9 - 10.3 mg/dL   Total Protein 7.3 6.5 - 8.1 g/dL   Albumin 4.0 3.5 - 5.0 g/dL   AST 18 15 - 41 U/L   ALT 24 0 - 44 U/L   Alkaline Phosphatase 81 38 - 126 U/L   Total Bilirubin 0.9 0.3 - 1.2 mg/dL   GFR, Estimated >60 >60 mL/min   Anion gap 7 5 - 15  Lipase, blood  Result Value Ref Range   Lipase 28 11 - 51 U/L   CT Abdomen Pelvis W Contrast  Result Date: 06/10/2022 CLINICAL DATA:  Severe right-sided abdominal pain. EXAM: CT ABDOMEN AND PELVIS WITH CONTRAST TECHNIQUE: Multidetector CT imaging of the abdomen and pelvis was performed using the standard protocol following bolus administration of intravenous contrast. RADIATION DOSE  REDUCTION: This exam was performed according to the departmental dose-optimization program which includes automated exposure control, adjustment of the mA and/or kV according to patient size and/or use of iterative reconstruction technique. CONTRAST:  173m OMNIPAQUE IOHEXOL 300 MG/ML  SOLN COMPARISON:  Noncontrast CT on 09/15/2021 FINDINGS: Lower Chest: No acute findings. Hepatobiliary: No hepatic masses identified. Prior cholecystectomy. No evidence of biliary obstruction. Pancreas:  No mass or inflammatory changes. Spleen: Within normal limits in size and appearance. Adrenals/Urinary Tract: No masses identified. Two benign-appearing right renal cysts are again seen (no followup imaging recommended). No evidence of ureteral calculi or hydronephrosis. Stomach/Bowel: No evidence of obstruction, inflammatory process or abnormal fluid collections. Normal appendix visualized. Diverticulosis is seen mainly involving the descending and sigmoid colon, however there is no evidence of diverticulitis. Vascular/Lymphatic: No pathologically enlarged lymph nodes. No acute vascular findings. Aortic atherosclerotic calcification incidentally noted. Reproductive:  No mass or other significant abnormality. Other:  None. Musculoskeletal:  No suspicious bone lesions identified. IMPRESSION: Colonic diverticulosis, without radiographic evidence of diverticulitis or other acute findings. Aortic Atherosclerosis (ICD10-I70.0). Electronically Signed   By: JMarlaine HindM.D.   On: 06/10/2022 18:56   MR LUMBAR SPINE WO CONTRAST  Result Date: 05/22/2022 CLINICAL DATA:  Lumbar radiculopathy. Low back pain radiating into both legs for 2 months. EXAM: MRI LUMBAR SPINE WITHOUT CONTRAST TECHNIQUE: Multiplanar, multisequence MR imaging of the lumbar spine was performed. No intravenous contrast was administered. COMPARISON:  Lumbar spine CT 09/17/2021 FINDINGS: Segmentation: Mildly transitional lumbosacral anatomy. Pseudoarticulation between the  sacrum and an enlarged left L5 transverse process. Alignment:  Normal. Vertebrae: No fracture, suspicious marrow lesion, or significant marrow edema. Conus medullaris and cauda equina: Conus extends to the L2 level. Conus and cauda equina appear normal. Paraspinal and other soft tissues: Partially visualized right renal cyst as noted on the prior CT. Disc levels: Disc desiccation throughout the lumbar spine. Mild disc space narrowing at L3-4 and L4-5. L1-2: Mild facet hypertrophy without disc herniation or stenosis. L2-3: Mild disc bulging and mild-to-moderate facet hypertrophy without stenosis. L3-4: Mild left eccentric disc bulging and mild facet hypertrophy without stenosis. L4-5: Left eccentric disc bulging, small central disc protrusion, and moderate facet and ligamentum flavum hypertrophy result in borderline to mild spinal stenosis without neural foraminal stenosis. L5-S1: Mild-to-moderate right and severe left facet  arthrosis without disc herniation or stenosis. No significant change is evident compared to the prior CT. IMPRESSION: 1. Mild lumbar disc degeneration with up to mild spinal stenosis at L4-5. 2. Multilevel facet arthrosis, severe on the left at L5-S1. Electronically Signed   By: Logan Bores M.D.   On: 05/22/2022 17:05     ED Course / MDM   Clinical Course as of 06/10/22 2117  Sat Jun 10, 2022  1900 Handoff: follow up on urine [WF]    Clinical Course User Index [WF] Tedd Sias, [place]   Medical Decision Making Amount and/or Complexity of Data Reviewed Labs: ordered. Radiology: ordered.  Risk Prescription drug management.    I reviewed MRI from 05/22/2022 IMPRESSION: 1. Mild lumbar disc degeneration with up to mild spinal stenosis at L4-5. 2. Multilevel facet arthrosis, severe on the left at L5-S1.     Labs unremarkable.  Physical exam consistent with focal muscular trigger point pain.  This exactly reproduces her symptoms.  I do not think it is unreasonable to  follow-up on urinalysis however patient denies any urinary symptoms and states she does not want to wait for urinalysis.  We will provide patient with Toradol, Robaxin in addition to a small, 50 mcg dose of fentanyl.  Recommend warm compresses, stretching, massage, additional follow-up with PCP.   History without symptoms of urinary or stool retention or incontinence, neurologic changes such as sensation change or weakness lower extremities, coagulopathy or blood thinner use, is not elderly or with history of osteoporosis, denies any history of cancer, fever, IV drug use, weight changes (unexplained), or prolonged steroid use.   Physical exam most consistent with muscular strain. Doubt cauda equina or disc herniation d/t lack of saddle anesthesia/bowel or bladder incontinence or urinary retention, normal gait and reassuring physical examination without neurologic deficits.   History is not supportive of kidney stone, AAA, AD, pancreatitis, PE or PTX. Patient has no CVA tenderness or urinary sx to suggest pyelonephritis or kidney stone.   Will manage patient conservatively at this time. NSAIDs, back exercises/stretches, heat therapy and follow up with PCP if symptoms do not resolve in 3-4 weeks. Patient offered muscle relaxer for comfort at night. Counseled on need to return to ED for fever, worsening or concerning symptoms. Patient agreeable to plan and states understanding of follow up plans and return precautions.   Vitals WNL at time of discharge -patient continues to endorse pain but states it is improved.      Tedd Sias, [place] 06/10/22 2117    Fredia Sorrow, MD 06/16/22 [phone]

## 2022-06-10 NOTE — ED Provider Notes (Signed)
[facility] EMERGENCY DEPARTMENT Provider Note   CSN: [id number] Arrival date & time: 06/10/22  1402     History  Chief Complaint  Patient presents with   Back Pain    [name] is a tearful 66 y.o. female with history of lumbar DDD, CAD, HTN, hyperlipidemia, anxiety, chronic tobacco use, GERD, diverticulosis, IBS.  Presenting today with sudden increase of right-sided back/abdominal pain.  Currently being evaluated/worked up for worsening right-sided back pain and radiculopathy.  Recent MRI demonstrated lumbar DDD with some stenosis.  This morning without any direct mechanism/injury, felt sudden severe increase in pain more specifically localized to the right back, side, and abdomen.  States it is too painful to move or think.  States this feels extremely similar to when she had diverticulitis in the past.  Has not passed stool in the last 24 hours, which is unusual for her.  Denies fevers, hemoptysis, Hx of DVT, IV drug use.  Without N/V/D, numbness or tingling of extremities, weakness, headache, vision changes, chest pain, shortness of breath.  The history is provided by the patient and medical records.  Back Pain      Home Medications Prior to Admission medications   Medication Sig Start Date End Date Taking? Authorizing Provider  acetaminophen (TYLENOL) 650 MG CR tablet Take 1,300 mg by mouth every 8 (eight) hours as needed for pain.   Yes [provider]  amLODipine (NORVASC) 2.5 MG tablet Take 1 tablet (2.5 mg total) by mouth daily. 06/08/22  Yes Satira Sark, MD  antiseptic oral rinse (BIOTENE) LIQD 15 mLs by Mouth Rinse route daily as needed for dry mouth.   Yes [provider]  Ascorbic Acid (VITAMIN C) 1000 MG tablet Take 1,000 mg by mouth daily.   Yes [provider]  aspirin 81 MG tablet Take 1 tablet (81 mg total) by mouth 2 (two) times daily. X30 days then may resume your normal once daily dose 11/25/21  Yes Chadwell, Vonna Kotyk, PA-C   busPIRone (BUSPAR) 5 MG tablet Take 5 mg by mouth 2 (two) times daily as needed (anxiety). 09/22/19  Yes [provider]  Cholecalciferol (VITAMIN D) 50 MCG (2000 UT) tablet Take 2,000 Units by mouth daily.   Yes [provider]  cyclobenzaprine (FLEXERIL) 10 MG tablet Take 10 mg by mouth 2 (two) times daily as needed. 06/01/22  Yes [provider]  diazepam (VALIUM) 2 MG tablet Take 1 tablet (2 mg total) by mouth every 6 (six) hours as needed for anxiety, muscle spasms or sedation. 11/25/21 11/25/22 Yes Chadwell, Vonna Kotyk, PA-C  dicyclomine (BENTYL) 20 MG tablet Take 20 mg by mouth 3 (three) times daily. 05/23/22  Yes [provider]  DULoxetine (CYMBALTA) 60 MG capsule Take 60 mg by mouth daily. 04/06/15  Yes [provider]  gabapentin (NEURONTIN) 300 MG capsule Take 300 mg by mouth 3 (three) times daily. 05/15/22  Yes [provider]  hydrocortisone 2.5 % cream Apply 1 application topically daily as needed (Hemorrhoids).   Yes [provider]  isosorbide mononitrate (IMDUR) 30 MG 24 hr tablet TAKE 1 TABLET(30 MG) BY MOUTH DAILY Patient taking differently: Take 30 mg by mouth daily. 04/10/22  Yes Satira Sark, MD  losartan (COZAAR) 100 MG tablet Take 1 tablet (100 mg total) by mouth daily. 04/17/22 07/16/22 Yes Satira Sark, MD  mupirocin ointment Drue Stager) 2 % SMARTSIG:Both Nares 05/31/22  Yes [provider]  nitroGLYCERIN (NITROSTAT) 0.4 MG SL tablet Place 1 tablet (0.4  mg total) under the tongue every 5 (five) minutes x 3 doses as needed for chest pain. 05/10/22  Yes Satira Sark, MD  pantoprazole (PROTONIX) 40 MG tablet Take 1 tablet (40 mg total) by mouth 2 (two) times daily before a meal. 12/31/19  Yes [name] B, PA-C  polyethylene glycol (MIRALAX / GLYCOLAX) 17 g packet Take 17 g by mouth daily. 09/19/21  Yes Tat, Shanon Brow, MD  REPATHA SURECLICK 852 MG/ML SOAJ ADMINISTER 1 ML UNDER THE SKIN EVERY 14 DAYS AS  DIRECTED Patient taking differently: Inject 1 mL into the skin every 14 (fourteen) days. 01/03/22  Yes Satira Sark, MD  SF 5000 PLUS 1.1 % CREA dental cream Take by mouth. 06/08/22  Yes [provider]  triamcinolone acetonide (KENALOG-40) 40 MG/ML injection Inject 1 mL into the muscle once. 06/01/22  Yes [provider]  zinc gluconate 50 MG tablet Take 50 mg by mouth daily.   Yes [provider]  zolpidem (AMBIEN) 10 MG tablet Take 10 mg by mouth at bedtime.  03/23/16  Yes [provider]  MEDROL 4 MG TBPK tablet Take by mouth. Patient not taking: Reported on 06/10/2022 06/01/22   [provider]      Allergies    Ancef [cefazolin], Ciprofloxacin hcl, Meat [alpha-gal], Metaxalone, Morphine and related, and Sulfa antibiotics    Review of Systems   Review of Systems  Musculoskeletal:  Positive for back pain.    Physical Exam Updated Vital Signs BP (!) 150/87 (BP Location: Right Arm)   Pulse (!) 103   Temp 98.1 F (36.7 C) (Oral)   Resp 18   Ht '5\' 2"'$  (1.575 m)   Wt 88.9 kg   SpO2 95%   BMI 35.85 kg/m  Physical Exam Vitals and nursing note reviewed.  Constitutional:      General: She is not in acute distress.    Appearance: She is well-developed. She is not toxic-appearing or diaphoretic.     Comments: Appears uncomfortable, tearful, and in pain  HENT:     Head: Normocephalic and atraumatic.  Eyes:     Conjunctiva/sclera: Conjunctivae normal.  Cardiovascular:     Rate and Rhythm: Normal rate and regular rhythm.     Pulses: Normal pulses.     Heart sounds: No murmur heard.    Comments: 2+ DP, PT, and radial pulses bilaterally. Pulmonary:     Effort: Pulmonary effort is normal. No respiratory distress.     Breath sounds: Normal breath sounds.  Chest:     Chest wall: No tenderness.  Abdominal:     Palpations: Abdomen is soft.     Tenderness: There is abdominal tenderness (Diffuse, worse on right than left). There is guarding.   Musculoskeletal:        General: No swelling.     Cervical back: Neck supple.     Right lower leg: No edema.     Left lower leg: No edema.     Comments: Significant right-sided back pain without obvious step-off, swelling, deformity.  Positive SLR's.  Upper and lower extremities appear neurovascularly intact.  Significantly reduced range of motion of the back and torso due to elicited pain.  Unable to sit up in bed due to the pain.  Skin:    General: Skin is warm and dry.     Capillary Refill: Capillary refill takes less than 2 seconds.     Coloration: Skin is not jaundiced or pale.  Neurological:     Mental Status:  She is alert and oriented to person, place, and time.  Psychiatric:        Mood and Affect: Mood normal.     ED Results / Procedures / Treatments   Labs (all labs ordered are listed, but only abnormal results are displayed) Labs Reviewed  CBC WITH DIFFERENTIAL/PLATELET - Abnormal; Notable for the following components:      Result Value   Monocytes Absolute 1.1 (*)    All other components within normal limits  COMPREHENSIVE METABOLIC PANEL  LIPASE, BLOOD  URINALYSIS, ROUTINE W REFLEX MICROSCOPIC    EKG None  Radiology None  Procedures Procedures    Medications Ordered in ED Medications  fentaNYL (SUBLIMAZE) injection 50 mcg (50 mcg Intravenous Given 06/10/22 1739)  sodium chloride 0.9 % bolus 1,000 mL (1,000 mLs Intravenous New Bag/Given 06/10/22 1738)  ondansetron (ZOFRAN) injection 4 mg (4 mg Intravenous Given 06/10/22 1739)  iohexol (OMNIPAQUE) 300 MG/ML solution 100 mL (100 mLs Intravenous Contrast Given 06/10/22 1827)    ED Course/ Medical Decision Making/ A&P Clinical Course as of 06/10/22 1923  Sat Jun 10, 2022  1900 Handoff: follow up on urine [WF]    Clinical Course User Index [WF] Tedd Sias, [place]                           Medical Decision Making Amount and/or Complexity of Data Reviewed Labs: ordered. Radiology:  ordered.  Risk Prescription drug management.   67 y.o. female presents to the ED for concern of Back Pain   This involves an extensive number of treatment options, and is a complaint that carries with it a high risk of complications and morbidity.    Past Medical History / Co-morbidities / Social History: Hx of CAD, diverticulosis, GERD, IBS, HTN, hyperlipidemia, anxiety, chronic tobacco use, obesity, lumbar DDD Social Determinants of Health include: Elderly  Additional History:  Internal and external records from outside source obtained and reviewed including review MRI lumbar spine from 6/26: CLINICAL DATA:  Lumbar radiculopathy. Low back pain radiating into both legs for 2 months. ..Marland KitchenCOMPARISON:  Lumbar spine CT 09/17/2021...  FINDINGS: Segmentation: Mildly transitional lumbosacral anatomy.  Pseudoarticulation between the sacrum and an enlarged left L5 transverse process.  Alignment:  Normal. Vertebrae: No fracture, suspicious marrow lesion, or significant marrow edema.  Conus medullaris and cauda equina: Conus extends to the L2 level.  Conus and cauda equina appear normal.  Paraspinal and other soft tissues: Partially visualized right renal cyst as noted on the prior CT. Disc levels:  Disc desiccation throughout the lumbar spine. Mild disc space narrowing at L3-4 and L4-5. L1-2: Mild facet hypertrophy without disc herniation or stenosis. L2-3: Mild disc bulging and mild-to-moderate facet hypertrophy without stenosis. L3-4: Mild left eccentric disc bulging and mild facet hypertrophy without stenosis. L4-5: Left eccentric disc bulging, small central disc protrusion, and moderate facet and ligamentum flavum hypertrophy result in borderline to mild spinal stenosis without neural foraminal stenosis. L5-S1: Mild-to-moderate right and severe left facet arthrosis without disc herniation or stenosis. No significant change is evident compared to the prior CT.  IMPRESSION: 1. Mild lumbar disc degeneration  with up to mild spinal stenosis at L4-5. 2. Multilevel facet arthrosis, severe on the left at L5-S1. Electronically Signed By: Logan Bores M.D. On: 05/22/2022 17:05  Lab Tests: I ordered, and personally interpreted labs.  The pertinent results include:   CBC, CMP, and lipase: Unremarkable UA: Pending  Imaging Studies: I ordered imaging studies  including CT abdomen and pelvis, which is still pending  ED Course: Pt appears moderately uncomfortable and tearful on exam.  Nonseptic, nontoxic-appearing in NAD.  Complaining of sudden significant worsening of right-sided back pain.  Currently being worked up by specialist for worsening lower back pain with radiculopathy.  Has become debilitating for patient's daily activities over the last few weeks.  This morning, patient had sudden onset of severe and different pain of the right abdomen/back.  History of diverticulitis, which presented similarly per patient.  Patient is exquisitely tender on exam with guarding.  Abdomen is soft and nondistended.  Without vaginal or urinary complaints.  Lower suspicion for pyelonephritis.  History and clinical presentation not strongly suggestive of dissection.  No Hx of A-fib and not on anticoagulation.  Has not passed stool in over 24 hours, which is uncommon for her.  Fluid bolus provided, pain managed in ED.  Plan to proceed with CT abdomen and pelvis to rule out diverticulitis VS bowel obstruction VS bowel perforation.  CT abdomen pelvis pending.  Disposition: 1905  care of Averly L Kole transferred to [facility] at the end of my shift.  Patient case discussed at length.  Please see his/her note for further details.  Plan at time of handoff is still undetermined, differential still broad.  If CT of abdomen and pelvis negative for acute findings that may explain patient's presentation, concerned of worsening MSK etiology which may or may not include HNP, discitis, severe muscle spasm, worsening sciatica.   Urinalysis still pending.  This may be altered or completely changed at the discretion of the oncoming team pending results of further workup.   This chart was dictated using voice recognition software.  Despite best efforts to proofread, errors can occur which can change the documentation meaning.         Final Clinical Impression(s) / ED Diagnoses Final diagnoses:  None    Rx / DC Orders ED Discharge Orders     None         Candace Cruise 05/27/15 1949    Fredia Sorrow, MD 06/16/22 [phone]

## 2022-06-12 ENCOUNTER — Ambulatory Visit: Payer: BC Managed Care – PPO | Admitting: Cardiology

## 2022-07-14 ENCOUNTER — Other Ambulatory Visit: Payer: Self-pay | Admitting: Cardiology

## 2022-08-11 ENCOUNTER — Other Ambulatory Visit: Payer: Self-pay | Admitting: Cardiology

## 2022-08-11 ENCOUNTER — Other Ambulatory Visit: Payer: Self-pay | Admitting: *Deleted

## 2022-08-11 MED ORDER — LOSARTAN POTASSIUM 100 MG PO TABS: 100.0000 mg | ORAL_TABLET | Freq: Every day | ORAL | 1 refills | Status: DC

## 2022-08-25 ENCOUNTER — Emergency Department (HOSPITAL_COMMUNITY): Payer: BC Managed Care – PPO

## 2022-08-25 ENCOUNTER — Other Ambulatory Visit: Payer: Self-pay

## 2022-08-25 ENCOUNTER — Emergency Department (HOSPITAL_COMMUNITY)
Admission: EM | Admit: 2022-08-25 | Discharge: 2022-08-25 | Disposition: A | Discharge status: Home / Self Care | Payer: BC Managed Care – PPO | Attending: Emergency Medicine | Admitting: Emergency Medicine

## 2022-08-25 ENCOUNTER — Encounter (HOSPITAL_COMMUNITY): Payer: Self-pay

## 2022-08-25 DIAGNOSIS — Z79899 Other long term (current) drug therapy: Secondary | ICD-10-CM | POA: Diagnosis not present

## 2022-08-25 DIAGNOSIS — R1084 Generalized abdominal pain: Secondary | ICD-10-CM | POA: Diagnosis not present

## 2022-08-25 DIAGNOSIS — R109 Unspecified abdominal pain: Secondary | ICD-10-CM

## 2022-08-25 DIAGNOSIS — Z7982 Long term (current) use of aspirin: Secondary | ICD-10-CM | POA: Insufficient documentation

## 2022-08-25 DIAGNOSIS — I2510 Atherosclerotic heart disease of native coronary artery without angina pectoris: Secondary | ICD-10-CM | POA: Diagnosis not present

## 2022-08-25 DIAGNOSIS — R000 Tachycardia, unspecified: Secondary | ICD-10-CM | POA: Diagnosis not present

## 2022-08-25 DIAGNOSIS — I10 Essential (primary) hypertension: Secondary | ICD-10-CM | POA: Diagnosis not present

## 2022-08-25 LAB — CBC WITH DIFFERENTIAL/PLATELET
Abs Immature Granulocytes: 0.06 10*3/uL (ref 0.00–0.07)
Basophils Absolute: 0.1 10*3/uL (ref 0.0–0.1)
Basophils Relative: 1 %
Eosinophils Absolute: 0.2 10*3/uL (ref 0.0–0.5)
Eosinophils Relative: 2 %
HCT: 44.4 % (ref 36.0–46.0)
Hemoglobin: 14.5 g/dL (ref 12.0–15.0)
Immature Granulocytes: 1 %
Lymphocytes Relative: 21 %
Lymphs Abs: 2.1 10*3/uL (ref 0.7–4.0)
MCH: 30.8 pg (ref 26.0–34.0)
MCHC: 32.7 g/dL (ref 30.0–36.0)
MCV: 94.3 fL (ref 80.0–100.0)
Monocytes Absolute: 0.9 10*3/uL (ref 0.1–1.0)
Monocytes Relative: 9 %
Neutro Abs: 6.8 10*3/uL (ref 1.7–7.7)
Neutrophils Relative %: 66 %
Platelets: 442 10*3/uL — ABNORMAL HIGH (ref 150–400)
RBC: 4.71 MIL/uL (ref 3.87–5.11)
RDW: 12.7 % (ref 11.5–15.5)
WBC: 10.1 10*3/uL (ref 4.0–10.5)
nRBC: 0 % (ref 0.0–0.2)

## 2022-08-25 LAB — COMPREHENSIVE METABOLIC PANEL
ALT: 22 U/L (ref 0–44)
AST: 21 U/L (ref 15–41)
Albumin: 4.2 g/dL (ref 3.5–5.0)
Alkaline Phosphatase: 77 U/L (ref 38–126)
Anion gap: 10 (ref 5–15)
BUN: 10 mg/dL (ref 8–23)
CO2: 27 mmol/L (ref 22–32)
Calcium: 9.4 mg/dL (ref 8.9–10.3)
Chloride: 101 mmol/L (ref 98–111)
Creatinine, Ser: 0.82 mg/dL (ref 0.44–1.00)
GFR, Estimated: >60 mL/min (ref >60)
Glucose, Bld: 96 mg/dL (ref 70–99)
Potassium: 3.8 mmol/L (ref 3.5–5.1)
Sodium: 138 mmol/L (ref 135–145)
Total Bilirubin: 0.8 mg/dL (ref 0.3–1.2)
Total Protein: 7.9 g/dL (ref 6.5–8.1)

## 2022-08-25 LAB — URINALYSIS, ROUTINE W REFLEX MICROSCOPIC
Bilirubin Urine: NEGATIVE
Glucose, UA: NEGATIVE mg/dL
Hgb urine dipstick: NEGATIVE
Ketones, ur: NEGATIVE mg/dL
Leukocytes,Ua: NEGATIVE
Nitrite: NEGATIVE
Protein, ur: NEGATIVE mg/dL
Specific Gravity, Urine: 1.006 (ref 1.005–1.030)
pH: 7 (ref 5.0–8.0)

## 2022-08-25 LAB — LIPASE, BLOOD: Lipase: 29 U/L (ref 11–51)

## 2022-08-25 MED ORDER — DIPHENHYDRAMINE HCL 50 MG/ML IJ SOLN
12.5000 mg | Freq: Once | INTRAMUSCULAR | Status: AC
  Administered 2022-08-25: 12.5 mg via INTRAVENOUS
  Filled 2022-08-25: qty 1

## 2022-08-25 MED ORDER — SODIUM CHLORIDE 0.9 % IV BOLUS
1000.0000 mL | Freq: Once | INTRAVENOUS | Status: AC
  Administered 2022-08-25: 1000 mL via INTRAVENOUS

## 2022-08-25 MED ORDER — IOHEXOL 300 MG/ML  SOLN
100.0000 mL | Freq: Once | INTRAMUSCULAR | Status: AC | PRN
  Administered 2022-08-25: 100 mL via INTRAVENOUS

## 2022-08-25 MED ORDER — DICYCLOMINE HCL 10 MG PO CAPS
10.0000 mg | ORAL_CAPSULE | Freq: Once | ORAL | Status: AC
  Administered 2022-08-25: 10 mg via ORAL
  Filled 2022-08-25: qty 1

## 2022-08-25 MED ORDER — DICYCLOMINE HCL 20 MG PO TABS
20.0000 mg | ORAL_TABLET | Freq: Two times a day (BID) | ORAL | 0 refills | Status: DC
Start: 2022-08-25 — End: 2022-09-07

## 2022-08-25 MED ORDER — ONDANSETRON HCL 4 MG/2ML IJ SOLN
4.0000 mg | Freq: Once | INTRAMUSCULAR | Status: AC
Start: 2022-08-25 — End: 2022-08-25
  Administered 2022-08-25: 4 mg via INTRAVENOUS
  Filled 2022-08-25: qty 2

## 2022-08-25 MED ORDER — KETOROLAC TROMETHAMINE 15 MG/ML IJ SOLN
15.0000 mg | Freq: Once | INTRAMUSCULAR | Status: AC
  Administered 2022-08-25: 15 mg via INTRAVENOUS
  Filled 2022-08-25: qty 1

## 2022-08-25 MED ORDER — MORPHINE SULFATE (PF) 4 MG/ML IV SOLN
4.0000 mg | Freq: Once | INTRAVENOUS | Status: AC
  Administered 2022-08-25: 4 mg via INTRAVENOUS
  Filled 2022-08-25: qty 1

## 2022-08-25 NOTE — Discharge Instructions (Addendum)
Note your work-up today was overall reassuring.  As discussed, we will send in the medicine to use while emergency department.  You can take as needed for pain in addition to Tylenol at home..  I recommend close follow-up with your PCP in 2 to 3 days for reevaluation of your symptoms.  Please do not hesitate to return to emergency department for worrisome signs and symptoms we discussed become apparent.

## 2022-08-25 NOTE — ED Triage Notes (Signed)
Pt arrived from home with complaints of stomach pain on right side that started last week. Says pain has gotten worse today. Pt has hx of colitis

## 2022-08-25 NOTE — ED Provider Notes (Cosign Needed)
[facility] EMERGENCY DEPARTMENT Provider Note   CSN: [id number] Arrival date & time: 08/25/22  1552     History  Chief Complaint  Patient presents with   Abdominal Pain    [name] is a 66 y.o. female.   Abdominal Pain   66 year old female presents emergency department with complaints of abdominal pain.  Patient states that she has had right sided abdominal pain for approximately the past week.  She reports history of diverticulitis last November and states this feels similar in nature but worse pain than her episode prior.  Patient reports worsening of symptoms with consumption of food as well as with certain movements.  She states she is taken Tylenol at home with minimal to no relief of symptoms.  Reports associated symptoms of nausea with no emesis.  Denies urinary/vaginal symptoms, change in bowel habits, fever, chills, night sweats, chest pain, shortness of breath.  Prior abdominal surgeries include cholecystectomy.  Past medical history significant for hypertension, stenting, CAD, diverticulitis, appendicitis, IBS, hyperlipidemia  Home Medications Prior to Admission medications   Medication Sig Start Date End Date Taking? Authorizing Provider  dicyclomine (BENTYL) 20 MG tablet Take 1 tablet (20 mg total) by mouth 2 (two) times daily. 08/25/22  Yes Dion Saucier A, PA  acetaminophen (TYLENOL) 650 MG CR tablet Take 1,300 mg by mouth every 8 (eight) hours as needed for pain.    [provider]  amLODipine (NORVASC) 2.5 MG tablet Take 1 tablet (2.5 mg total) by mouth daily. 06/08/22   Satira Sark, MD  antiseptic oral rinse (BIOTENE) LIQD 15 mLs by Mouth Rinse route daily as needed for dry mouth.    [provider]  Ascorbic Acid (VITAMIN C) 1000 MG tablet Take 1,000 mg by mouth daily.    [provider]  aspirin 81 MG tablet Take 1 tablet (81 mg total) by mouth 2 (two) times daily. X30 days then may resume your normal once daily dose  11/25/21   Chadwell, Vonna Kotyk, PA-C  busPIRone (BUSPAR) 5 MG tablet Take 5 mg by mouth 2 (two) times daily as needed (anxiety). 09/22/19   [provider]  Cholecalciferol (VITAMIN D) 50 MCG (2000 UT) tablet Take 2,000 Units by mouth daily.    [provider]  cyclobenzaprine (FLEXERIL) 10 MG tablet Take 10 mg by mouth 2 (two) times daily as needed. 06/01/22   [provider]  diazepam (VALIUM) 2 MG tablet Take 1 tablet (2 mg total) by mouth every 6 (six) hours as needed for anxiety, muscle spasms or sedation. 11/25/21 11/25/22  Chadwell, Vonna Kotyk, PA-C  DULoxetine (CYMBALTA) 60 MG capsule Take 60 mg by mouth daily. 04/06/15   [provider]  gabapentin (NEURONTIN) 300 MG capsule Take 300 mg by mouth 3 (three) times daily. 05/15/22   [provider]  hydrocortisone 2.5 % cream Apply 1 application topically daily as needed (Hemorrhoids).    [provider]  isosorbide mononitrate (IMDUR) 30 MG 24 hr tablet TAKE 1 TABLET(30 MG) BY MOUTH DAILY Patient taking differently: Take 30 mg by mouth daily. 04/10/22   Satira Sark, MD  losartan (COZAAR) 100 MG tablet Take 1 tablet (100 mg total) by mouth daily. 08/11/22   Satira Sark, MD  MEDROL 4 MG TBPK tablet Take by mouth. Patient not taking: Reported on 06/10/2022 06/01/22   [provider]  mupirocin ointment (BACTROBAN) 2 % SMARTSIG:Both Nares 05/31/22   [provider]  nitroGLYCERIN (NITROSTAT) 0.4 MG SL tablet Place 1  tablet (0.4 mg total) under the tongue every 5 (five) minutes x 3 doses as needed for chest pain. 05/10/22   Satira Sark, MD  pantoprazole (PROTONIX) 40 MG tablet Take 1 tablet (40 mg total) by mouth 2 (two) times daily before a meal. 12/31/19   [name] B, PA-C  polyethylene glycol (MIRALAX / GLYCOLAX) 17 g packet Take 17 g by mouth daily. 09/19/21   Orson Eva, MD  REPATHA SURECLICK 295 MG/ML SOAJ ADMINISTER 1 ML UNDER THE SKIN EVERY 14 DAYS AS  DIRECTED Patient taking differently: Inject 1 mL into the skin every 14 (fourteen) days. 01/03/22   Satira Sark, MD  SF 5000 PLUS 1.1 % CREA dental cream Take by mouth. 06/08/22   [provider]  triamcinolone acetonide (KENALOG-40) 40 MG/ML injection Inject 1 mL into the muscle once. 06/01/22   [provider]  zinc gluconate 50 MG tablet Take 50 mg by mouth daily.    [provider]  zolpidem (AMBIEN) 10 MG tablet Take 10 mg by mouth at bedtime.  03/23/16   [provider]      Allergies    Ancef [cefazolin], Ciprofloxacin hcl, Meat [alpha-gal], Metaxalone, Morphine and related, and Sulfa antibiotics    Review of Systems   Review of Systems  Gastrointestinal:  Positive for abdominal pain.  All other systems reviewed and are negative.   Physical Exam Updated Vital Signs BP 132/79   Pulse 94   Temp 98.2 F (36.8 C) (Oral)   Resp 18   Ht '5\' 2"'$  (1.575 m)   Wt 90.7 kg   SpO2 97%   BMI 36.58 kg/m  Physical Exam Vitals and nursing note reviewed.  Constitutional:      General: She is not in acute distress.    Appearance: She is well-developed.  HENT:     Head: Normocephalic and atraumatic.  Eyes:     Conjunctiva/sclera: Conjunctivae normal.  Cardiovascular:     Rate and Rhythm: Regular rhythm. Tachycardia present.     Heart sounds: No murmur heard. Pulmonary:     Effort: Pulmonary effort is normal. No respiratory distress.     Breath sounds: Normal breath sounds.  Abdominal:     Palpations: Abdomen is soft.     Tenderness: There is abdominal tenderness in the right lower quadrant. There is no right CVA tenderness or left CVA tenderness.     Comments: Patient has generalized tenderness to palpation that is greater in the right lower quadrant.  Musculoskeletal:        General: No swelling.     Cervical back: Neck supple.  Skin:    General: Skin is warm and dry.     Capillary Refill: Capillary refill takes less than 2 seconds.   Neurological:     Mental Status: She is alert.  Psychiatric:        Mood and Affect: Mood normal.     ED Results / Procedures / Treatments   Labs (all labs ordered are listed, but only abnormal results are displayed) Labs Reviewed  CBC WITH DIFFERENTIAL/PLATELET - Abnormal; Notable for the following components:      Result Value   Platelets 442 (*)    All other components within normal limits  URINALYSIS, ROUTINE W REFLEX MICROSCOPIC - Abnormal; Notable for the following components:   Color, Urine STRAW (*)    All other components within normal limits  COMPREHENSIVE METABOLIC PANEL  LIPASE, BLOOD    EKG None  Radiology CT Abdomen  Pelvis W Contrast  Result Date: 08/25/2022 CLINICAL DATA:  Right-sided abdominal pain for 1 week EXAM: CT ABDOMEN AND PELVIS WITH CONTRAST TECHNIQUE: Multidetector CT imaging of the abdomen and pelvis was performed using the standard protocol following bolus administration of intravenous contrast. RADIATION DOSE REDUCTION: This exam was performed according to the departmental dose-optimization program which includes automated exposure control, adjustment of the mA and/or kV according to patient size and/or use of iterative reconstruction technique. CONTRAST:  124m OMNIPAQUE IOHEXOL 300 MG/ML  SOLN COMPARISON:  06/10/2022 FINDINGS: Lower chest: No acute abnormality. Hepatobiliary: No focal liver abnormality is seen. Status post cholecystectomy. No biliary dilatation. Pancreas: Unremarkable. No pancreatic ductal dilatation or surrounding inflammatory changes. Spleen: Normal in size without focal abnormality. Adrenals/Urinary Tract: Adrenal glands are within normal limits. Kidneys demonstrate no renal calculi or obstructive changes. The enhancement pattern is normal with normal excretion. Simple cysts are noted within the right kidney measuring up to 6 cm. These are stable in appearance from the prior exam and appear simple in nature. No further follow-up is  recommended. Ureters are within normal limits. The bladder is partially distended. Stomach/Bowel: Scattered diverticular change of the colon is noted without evidence of diverticulitis. The appendix is within normal limits. Small bowel and stomach are unremarkable. Vascular/Lymphatic: Aortic atherosclerosis. No enlarged abdominal or pelvic lymph nodes. Reproductive: Uterus and bilateral adnexa are unremarkable. Other: No abdominal wall hernia or abnormality. No abdominopelvic ascites. Musculoskeletal: No acute or significant osseous findings. IMPRESSION: Diverticulosis without diverticulitis. Aortic Atherosclerosis (ICD10-I70.0). Electronically Signed   By: MInez CatalinaM.D.   On: 08/25/2022 19:57    Procedures Procedures    Medications Ordered in ED Medications  sodium chloride 0.9 % bolus 1,000 mL (0 mLs Intravenous Stopped 08/25/22 2002)  morphine (PF) 4 MG/ML injection 4 mg (4 mg Intravenous Given 08/25/22 1715)  ondansetron (ZOFRAN) injection 4 mg (4 mg Intravenous Given 08/25/22 1714)  diphenhydrAMINE (BENADRYL) injection 12.5 mg (12.5 mg Intravenous Given 08/25/22 1714)  iohexol (OMNIPAQUE) 300 MG/ML solution 100 mL (100 mLs Intravenous Contrast Given 08/25/22 1942)  dicyclomine (BENTYL) capsule 10 mg (10 mg Oral Given 08/25/22 2021)  ketorolac (TORADOL) 15 MG/ML injection 15 mg (15 mg Intravenous Given 08/25/22 2021)    ED Course/ Medical Decision Making/ A&P                           Medical Decision Making Amount and/or Complexity of Data Reviewed Labs: ordered. Radiology: ordered.  Risk Prescription drug management.   This patient presents to the ED for concern of abdominal pain, this involves an extensive number of treatment options, and is a complaint that carries with it a high risk of complications and morbidity.  The differential diagnosis includes AAA, mesenteric ischemia, appendicitis, diverticulitis, gastroenteritis, gastritis, nephrolithiasis, pancreatitis, peritonitis, UTI,  SBO/LBO, biliary disease, IBD, IBS, PUD, hepatitis, ovarian torsion, PID  Co morbidities that complicate the patient evaluation  See HPI   Additional history obtained:  Additional history obtained from EMR External records from outside source obtained and reviewed including prior CT imaging from 06/10/2022 indicating diverticulosis without evidence of diverticulitis.   Lab Tests:  I Ordered, and personally interpreted labs.  The pertinent results include: No leukocytosis noted.  No evidence of anemia.  Platelets within normal range.  No electrolyte abnormalities noted.  Renal function within normal limits.  No transaminitis noted.  Lipase within normal limits.  UA insignificant for acute abnormalities.   Imaging Studies ordered:  I ordered  imaging studies including CT abdomen pelvis I independently visualized and interpreted imaging which showed diverticulosis without evidence of diverticulitis.  Aortic atherosclerosis.   I agree with the radiologist interpretation   Cardiac Monitoring: / EKG:  The patient was maintained on a cardiac monitor.  I personally viewed and interpreted the cardiac monitored which showed an underlying rhythm of: Sinus rhythm   Consultations Obtained:  N/a   Problem List / ED Course / Critical interventions / Medication management  Abdominal pain I ordered medication including Zofran for nausea, morphine for pain, Benadryl for reaction to morphine.  Toradol for pain, Bentyl for pain.    Reevaluation of the patient after these medicines showed that the patient improved I have reviewed the patients home medicines and have made adjustments as needed   Social Determinants of Health:  Former cigarette use.  Denies illicit drug use.   Test / Admission - Considered:  Abdominal pain Vitals signs significant for initial tachycardia with a heart rate of 109 as well as hypertension with a blood pressure 147/95.  Both decreased within normal range with  administration of medicines while in the emergency department.. Otherwise within normal range and stable throughout visit. Laboratory/imaging studies significant for: See above Unsure of exact etiology of patient's pain.  Patient's work-up overall negative today for any acute abnormalities.  Given dermatomal light distribution of patient's pain, concern for possible varicella zoster although no clinical evidence of rash present today.  Pain is described as worse with movement so could be muscular in origin as well.  Doubt AAA, mesenteric ischemia given HPI as well as reassuring physical exam.  Doubt appendicitis, diverticulitis, peritonitis, biliary disease.  Patient responded well to medicines administered while emergency department so we will discharge with Bentyl to use.  Patient advised on the side effect profile of Bentyl.  Patient recommended close follow-up with PCP in 2 to 3 days for reevaluation.  Treatment plan discussed length with patient and she acknowledged understanding was agreeable to said plan.  Patient stable upon discharge from the hospital.        Final Clinical Impression(s) / ED Diagnoses Final diagnoses:  Abdominal pain, unspecified abdominal location    Rx / DC Orders ED Discharge Orders          Ordered    dicyclomine (BENTYL) 20 MG tablet  2 times daily        08/25/22 2034              Wilnette Kales, [place] 08/25/22 2034    Fransico Meadow, MD 08/26/22 1000    Marcello Fennel, PA-C 08/26/22 1537    Fransico Meadow, MD 08/27/22 [phone]

## 2022-08-25 NOTE — ED Notes (Addendum)
Attempted to establish IV twice with no success.

## 2022-08-25 NOTE — ED Notes (Addendum)
Patient given water per request.  OK with PA Unk Lightning.  Husband at bedside.

## 2022-09-06 ENCOUNTER — Ambulatory Visit: Payer: BC Managed Care – PPO | Attending: Cardiology | Admitting: Cardiology

## 2022-09-06 ENCOUNTER — Encounter: Payer: Self-pay | Admitting: Cardiology

## 2022-09-06 VITALS — BP 138/88 | HR 98 | Ht 62.0 in | Wt 200.0 lb

## 2022-09-06 DIAGNOSIS — I10 Essential (primary) hypertension: Secondary | ICD-10-CM

## 2022-09-06 DIAGNOSIS — M791 Myalgia, unspecified site: Secondary | ICD-10-CM

## 2022-09-06 DIAGNOSIS — I25119 Atherosclerotic heart disease of native coronary artery with unspecified angina pectoris: Secondary | ICD-10-CM | POA: Diagnosis not present

## 2022-09-06 DIAGNOSIS — T466X5A Adverse effect of antihyperlipidemic and antiarteriosclerotic drugs, initial encounter: Secondary | ICD-10-CM

## 2022-09-06 DIAGNOSIS — E782 Mixed hyperlipidemia: Secondary | ICD-10-CM

## 2022-09-06 MED ORDER — NITROGLYCERIN 0.4 MG SL SUBL: 0.4000 mg | SUBLINGUAL_TABLET | SUBLINGUAL | 3 refills | Status: DC | PRN

## 2022-09-06 NOTE — Progress Notes (Signed)
Cardiology Office Note  Date: 09/06/2022   ID: MADALIN HUGHART, DOB 02-28-1956, MRN [id number]  PCP:  Lanelle Bal, PA-C  Cardiologist:  Rozann Lesches, MD Electrophysiologist:  None   Chief Complaint  Patient presents with   Cardiac follow-up    History of Present Illness: [name] is a 66 y.o. female last seen in May 2022.  She is here for a routine visit.  Reports no angina or nitroglycerin use, needs a refill for fresh bottle.  She has mainly been limited by chronic knee and lower back pain.  I reviewed her cardiac medications which are stable and outlined below.  She has done well on Repatha, following lab work per PCP.  Blood pressure today is reasonable, she continues on Norvasc and Cozaar.  Past Medical History:  Diagnosis Date   Acute ST elevation myocardial infarction (STEMI) of inferior wall ([place]) 2012   Anxiety    Arthritis    CAD (coronary artery disease)    DES to the proximal and distal RCA April 2012 in the setting of inferior STEMI; occluded mid to distal RCA with left-to-right collaterals 2018, managed medically   Cancer ([place])    Carotid stenosis    Less than 50% 8/11   Chronic low back pain    Depression    Diverticulitis    Diverticulosis    Essential hypertension    Fibromyalgia    GERD (gastroesophageal reflux disease) 03/02/2014   H/O hiatal hernia    History of kidney stones    Hyperlipidemia    Pneumonia    Urticaria     Past Surgical History:  Procedure Laterality Date   BIOPSY  10/18/2021   Procedure: BIOPSY;  Surgeon: Harvel Quale, MD;  [facility];  Service: Gastroenterology;;   CARDIAC SURGERY     stent placement   CHOLECYSTECTOMY     COLONOSCOPY N/A 04/01/2014   Procedure: COLONOSCOPY;  Surgeon: Rogene Houston, MD;  [facility]: [facility];  Service: Endoscopy;  Laterality: N/A;  100   COLONOSCOPY WITH PROPOFOL N/A 10/18/2021   Procedure: COLONOSCOPY WITH PROPOFOL;  Surgeon: Harvel Quale, MD;  [facility];  Service: Gastroenterology;  Laterality: N/A;  8:30   Cyst removed from spine     ECTOPIC PREGNANCY SURGERY     ESOPHAGOGASTRODUODENOSCOPY N/A 04/01/2014   Procedure: ESOPHAGOGASTRODUODENOSCOPY (EGD);  Surgeon: Rogene Houston, MD;  [facility];  Service: Endoscopy;  Laterality: N/A;  100   ESOPHAGOGASTRODUODENOSCOPY N/A 11/03/2016   Procedure: ESOPHAGOGASTRODUODENOSCOPY (EGD);  Surgeon: Rogene Houston, MD;  [facility];  Service: Endoscopy;  Laterality: N/A;  2:45   ESOPHAGOGASTRODUODENOSCOPY (EGD) WITH PROPOFOL N/A 10/18/2021   Procedure: ESOPHAGOGASTRODUODENOSCOPY (EGD) WITH PROPOFOL;  Surgeon: Harvel Quale, MD;  [facility];  Service: Gastroenterology;  Laterality: N/A;   Heart stent     2012  x 2 stents   KNEE ARTHROPLASTY Right    LEFT HEART CATH AND CORONARY ANGIOGRAPHY N/A 07/03/2017   Procedure: LEFT HEART CATH AND CORONARY ANGIOGRAPHY;  Surgeon: Burnell Blanks, MD;  Location: [facility];  Service: Cardiovascular;  Laterality: N/A;   Left knee     Arthroscopic   Left wrist     POLYPECTOMY  10/18/2021   Procedure: POLYPECTOMY;  Surgeon: Harvel Quale, MD;  [facility];  Service: Gastroenterology;;   Azzie Almas DILATION  10/18/2021   Procedure: Azzie Almas DILATION;  Surgeon: Harvel Quale, MD;  [facility]: [facility];  Service: Gastroenterology;;   TONSILLECTOMY AND ADENOIDECTOMY     TOTAL KNEE ARTHROPLASTY Left 09/26/2013   Procedure: TOTAL KNEE ARTHROPLASTY- LEFT;  Surgeon: [name]., MD;  Location: [facility];  Service: Orthopedics;  Laterality: Left;   TOTAL KNEE ARTHROPLASTY Right 11/25/2021   Procedure: TOTAL KNEE ARTHROPLASTY;  Surgeon: Earlie Server, MD;  Location: [facility];  Service: Orthopedics;  Laterality: Right;   TUBAL LIGATION      Current Outpatient Medications  Medication Sig Dispense Refill   acetaminophen (TYLENOL) 650  MG CR tablet Take 1,300 mg by mouth every 8 (eight) hours as needed for pain.     amLODipine (NORVASC) 2.5 MG tablet Take 1 tablet (2.5 mg total) by mouth daily. 90 tablet 0   antiseptic oral rinse (BIOTENE) LIQD 15 mLs by Mouth Rinse route daily as needed for dry mouth.     Ascorbic Acid (VITAMIN C) 1000 MG tablet Take 1,000 mg by mouth daily.     aspirin 81 MG tablet Take 1 tablet (81 mg total) by mouth 2 (two) times daily. X30 days then may resume your normal once daily dose 60 tablet 0   busPIRone (BUSPAR) 5 MG tablet Take 5 mg by mouth 2 (two) times daily as needed (anxiety).     Cholecalciferol (VITAMIN D) 50 MCG (2000 UT) tablet Take 2,000 Units by mouth daily.     cyclobenzaprine (FLEXERIL) 10 MG tablet Take 10 mg by mouth 2 (two) times daily as needed.     diazepam (VALIUM) 2 MG tablet Take 1 tablet (2 mg total) by mouth every 6 (six) hours as needed for anxiety, muscle spasms or sedation. 40 tablet 0   dicyclomine (BENTYL) 20 MG tablet Take 1 tablet (20 mg total) by mouth 2 (two) times daily. 20 tablet 0   DULoxetine (CYMBALTA) 60 MG capsule Take 60 mg by mouth daily.  3   gabapentin (NEURONTIN) 300 MG capsule Take 300 mg by mouth 3 (three) times daily.     hydrocortisone 2.5 % cream Apply 1 application topically daily as needed (Hemorrhoids).     isosorbide mononitrate (IMDUR) 30 MG 24 hr tablet TAKE 1 TABLET(30 MG) BY MOUTH DAILY (Patient taking differently: Take 30 mg by mouth daily.) 90 tablet 1   losartan (COZAAR) 100 MG tablet Take 1 tablet (100 mg total) by mouth daily. 90 tablet 1   mupirocin ointment (BACTROBAN) 2 % SMARTSIG:Both Nares     pantoprazole (PROTONIX) 40 MG tablet Take 1 tablet (40 mg total) by mouth 2 (two) times daily before a meal. 60 tablet 1   polyethylene glycol (MIRALAX / GLYCOLAX) 17 g packet Take 17 g by mouth daily. 14 each 0   REPATHA SURECLICK 341 MG/ML SOAJ ADMINISTER 1 ML UNDER THE SKIN EVERY 14 DAYS AS DIRECTED (Patient taking differently: Inject 1 mL  into the skin every 14 (fourteen) days.) 6 mL 3   SF 5000 PLUS 1.1 % CREA dental cream Take by mouth.     triamcinolone acetonide (KENALOG-40) 40 MG/ML injection Inject 1 mL into the muscle once.     zinc gluconate 50 MG tablet Take 50 mg by mouth daily.     zolpidem (AMBIEN) 10 MG tablet Take 10 mg by mouth at bedtime.   3   nitroGLYCERIN (NITROSTAT) 0.4 MG SL tablet Place 1 tablet (0.4 mg total) under the tongue every 5 (five) minutes x 3 doses as needed for chest pain (if no relief after 2nd dose, proceed  to ED or call 911). 25 tablet 3   No current facility-administered medications for this visit.   Allergies:  Ancef [cefazolin], Ciprofloxacin hcl, Meat [alpha-gal], Metaxalone, Morphine and related, and Sulfa antibiotics   ROS: No syncope.  No orthopnea or PND.  Physical Exam: VS:  BP 138/88   Pulse 98   Ht '5\' 2"'$  (1.575 m)   Wt 200 lb (90.7 kg)   SpO2 96%   BMI 36.58 kg/m , BMI Body mass index is 36.58 kg/m.  Wt Readings from Last 3 Encounters:  09/06/22 200 lb (90.7 kg)  08/25/22 200 lb (90.7 kg)  06/10/22 196 lb (88.9 kg)    General: Patient appears comfortable at rest. HEENT: Conjunctiva and lids normal. Neck: Supple, no elevated JVP or carotid bruits. Lungs: Clear to auscultation, nonlabored breathing at rest. Cardiac: Regular rate and rhythm, no S3 or significant systolic murmur, no pericardial rub. Extremities: No pitting edema.  ECG:  An ECG dated 11/17/2021 was personally reviewed today and demonstrated:  Sinus rhythm with decreased R wave progression.  Recent Labwork: 09/18/2021: Magnesium 1.9 08/25/2022: ALT 22; AST 21; BUN 10; Creatinine, Ser 0.82; Hemoglobin 14.5; Platelets 442; Potassium 3.8; Sodium 138   Other Studies Reviewed Today:  Carotid Dopplers 06/15/2020: IMPRESSION: Color duplex indicates minimal heterogeneous plaque, with no hemodynamically significant stenosis by duplex criteria in the extracranial cerebrovascular circulation.  Assessment  and Plan:  1.  CAD status post DES to the proximal and distal RCA in 2012 in the setting of inferior STEMI.  The mid to distal RCA is known to be occluded with left-to-right collaterals as of 2018 and she has been clinically stable on medical therapy.  Continue aspirin, Norvasc, Imdur, Cozaar, Repatha, and as needed nitroglycerin which will be refilled.  2.  Mixed hyperlipidemia with statin myalgias.  She is doing well on Repatha, continue to follow lab work with PCP.  3.  Essential hypertension, no changes made to current regimen including Norvasc and Cozaar.  Medication Adjustments/Labs and Tests Ordered: Current medicines are reviewed at length with the patient today.  Concerns regarding medicines are outlined above.   Tests Ordered: No orders of the defined types were placed in this encounter.   Medication Changes: Meds ordered this encounter  Medications   nitroGLYCERIN (NITROSTAT) 0.4 MG SL tablet    Sig: Place 1 tablet (0.4 mg total) under the tongue every 5 (five) minutes x 3 doses as needed for chest pain (if no relief after 2nd dose, proceed to ED or call 911).    Dispense:  25 tablet    Refill:  3    Disposition:  Follow up  1 year, sooner if needed.  Signed, Satira Sark, MD, [facility] 09/06/2022 4:22 PM    [facility] [address], [place], [place] [address] Phone: [phone]; Fax: [phone]

## 2022-09-06 NOTE — Patient Instructions (Addendum)

## 2022-09-07 ENCOUNTER — Encounter (INDEPENDENT_AMBULATORY_CARE_PROVIDER_SITE_OTHER): Payer: Self-pay | Admitting: Gastroenterology

## 2022-09-07 ENCOUNTER — Other Ambulatory Visit (INDEPENDENT_AMBULATORY_CARE_PROVIDER_SITE_OTHER): Payer: Self-pay | Admitting: Gastroenterology

## 2022-09-07 ENCOUNTER — Ambulatory Visit (INDEPENDENT_AMBULATORY_CARE_PROVIDER_SITE_OTHER): Payer: BC Managed Care – PPO | Admitting: Gastroenterology

## 2022-09-07 VITALS — BP 153/74 | HR 99 | Temp 98.1°F | Ht 62.0 in | Wt 199.3 lb

## 2022-09-07 DIAGNOSIS — R1011 Right upper quadrant pain: Secondary | ICD-10-CM | POA: Diagnosis not present

## 2022-09-07 DIAGNOSIS — R142 Eructation: Secondary | ICD-10-CM | POA: Diagnosis not present

## 2022-09-07 DIAGNOSIS — K219 Gastro-esophageal reflux disease without esophagitis: Secondary | ICD-10-CM

## 2022-09-07 MED ORDER — OMEPRAZOLE 40 MG PO CPDR: 40.0000 mg | DELAYED_RELEASE_CAPSULE | Freq: Two times a day (BID) | ORAL | 1 refills | Status: DC

## 2022-09-07 MED ORDER — HYOSCYAMINE SULFATE 0.125 MG SL SUBL: 0.1250 mg | SUBLINGUAL_TABLET | Freq: Four times a day (QID) | SUBLINGUAL | 1 refills | Status: DC | PRN

## 2022-09-07 MED ORDER — HYOSCYAMINE SULFATE 0.125 MG SL SUBL: 0.1250 mg | SUBLINGUAL_TABLET | Freq: Four times a day (QID) | SUBLINGUAL | 0 refills | Status: DC | PRN

## 2022-09-07 NOTE — Progress Notes (Signed)
Referring Provider: Lanelle Bal, PA-C Primary Care Physician:  Lanelle Bal, PA-C Primary GI Physician: Jenetta Downer   Chief Complaint  Patient presents with   Flank Pain    Patient here today due to right upper flank pain. She says at times when she moves she has some belching. Patient had a ct scan on September 9,2023 at [facility]. She has been taking tylenol prn,but this is of no help.   HPI:   [name] is a 66 y.o. female with past medical history of alpha gal sensitivity, anxiety, coronary artery disease status post stent placement, carotid stenosis, hyperlipidemia, nephrolithiasis, GERD, hypertension and IBS  Patient presenting today for RUQ pain.   Last seen 09/2021, at that time having worsening lower abd pain, recent hospitalization for abdominal and right flank pain prior to that, CT at that time showed diverticulitis in sigmoid, started on zosyn and augmentin. She also noted intermittent recurrent RUQ pain when moving abruptly. Having some constipation due to opiate pain meds, taking otc laxatives and probiotic. Occasionally with BRB in stools when straining.   Patient recommended to start miralax, Rx bentyl '10mg'$  Q12h, set up colonoscopy.  Recent CT A/P w contrast on 08/25/22 with diverticulosis but no acute diverticulitis   Present:  Patient reports she is having some RUQ pain, can move around some and belch which will help her pain. She notes she is having more acid reflux at times as well. She notes RUQ pain for a few years though recently getting worse. Reports she has RUQ pain almost all the time, pain starts as soon as she gets up and moves around. She notes that acid reflux symptoms are almost daily and she is eating later at night due to [place]. She is maintained on protonix '40mg'$ , supposed to take it BID but usually only takes this daily. Denies nausea or vomiting. She feels that RUQ pain is sometimes worse after eating. She tries to avoid spicy foods, does not eat  red meat due to alpha gal. She notes sometimes RUQ pain radiates down into her mid abdomen. She states that she has taken bentyl without much relief. Denies constipation or diarrhea, she uses metamucil or miralax every day to every other day. She is s/p cholecystectomy.   Last Colonoscopy:10/18/21 - One 5 mm polyp in the cecum-sessile serrated - Diverticulosis in the sigmoid colon. - Congested mucosa in the sigmoid colon. - Non-bleeding internal hemorrhoids. Last EGD:10/18/21 - No endoscopic esophageal abnormality to explain patient's dysphagia. Esophagus dilated. Biopsied.Increased eosinophils but <15, on PPI BID  - 1 cm hiatal hernia. - Normal stomach. - Normal examined duodenum.  Recommendations:  Repeat tcs in 3 years  Past Medical History:  Diagnosis Date   Acute ST elevation myocardial infarction (STEMI) of inferior wall (August) 2012   Anxiety    Arthritis    CAD (coronary artery disease)    DES to the proximal and distal RCA April 2012 in the setting of inferior STEMI; occluded mid to distal RCA with left-to-right collaterals 2018, managed medically   Cancer ([place])    Carotid stenosis    Less than 50% 8/11   Chronic low back pain    Depression    Diverticulitis    Diverticulosis    Essential hypertension    Fibromyalgia    GERD (gastroesophageal reflux disease) 03/02/2014   H/O hiatal hernia    History of kidney stones    Hyperlipidemia    Pneumonia    Urticaria     Past  Surgical History:  Procedure Laterality Date   BIOPSY  10/18/2021   Procedure: BIOPSY;  Surgeon: Harvel Quale, MD;  [facility];  Service: Gastroenterology;;   CARDIAC SURGERY     stent placement   CHOLECYSTECTOMY     COLONOSCOPY N/A 04/01/2014   Procedure: COLONOSCOPY;  Surgeon: Rogene Houston, MD;  [facility];  Service: Endoscopy;  Laterality: N/A;  100   COLONOSCOPY WITH PROPOFOL N/A 10/18/2021   Procedure: COLONOSCOPY WITH PROPOFOL;  Surgeon: Harvel Quale, MD;  [facility];  Service: Gastroenterology;  Laterality: N/A;  8:30   Cyst removed from spine     ECTOPIC PREGNANCY SURGERY     ESOPHAGOGASTRODUODENOSCOPY N/A 04/01/2014   Procedure: ESOPHAGOGASTRODUODENOSCOPY (EGD);  Surgeon: Rogene Houston, MD;  [facility];  Service: Endoscopy;  Laterality: N/A;  100   ESOPHAGOGASTRODUODENOSCOPY N/A 11/03/2016   Procedure: ESOPHAGOGASTRODUODENOSCOPY (EGD);  Surgeon: Rogene Houston, MD;  [facility];  Service: Endoscopy;  Laterality: N/A;  2:45   ESOPHAGOGASTRODUODENOSCOPY (EGD) WITH PROPOFOL N/A 10/18/2021   Procedure: ESOPHAGOGASTRODUODENOSCOPY (EGD) WITH PROPOFOL;  Surgeon: Harvel Quale, MD;  [facility];  Service: Gastroenterology;  Laterality: N/A;   Heart stent     2012  x 2 stents   KNEE ARTHROPLASTY Right    LEFT HEART CATH AND CORONARY ANGIOGRAPHY N/A 07/03/2017   Procedure: LEFT HEART CATH AND CORONARY ANGIOGRAPHY;  Surgeon: Burnell Blanks, MD;  Location: [place] [facility];  Service: Cardiovascular;  Laterality: N/A;   Left knee     Arthroscopic   Left wrist     POLYPECTOMY  10/18/2021   Procedure: POLYPECTOMY;  Surgeon: Harvel Quale, MD;  [facility];  Service: Gastroenterology;;   Azzie Almas DILATION  10/18/2021   Procedure: Azzie Almas DILATION;  Surgeon: Montez Morita, Quillian Quince, MD;  [facility];  Service: Gastroenterology;;   TONSILLECTOMY AND ADENOIDECTOMY     TOTAL KNEE ARTHROPLASTY Left 09/26/2013   Procedure: TOTAL KNEE ARTHROPLASTY- LEFT;  Surgeon: [name]., MD;  Location: [place];  Service: Orthopedics;  Laterality: Left;   TOTAL KNEE ARTHROPLASTY Right 11/25/2021   Procedure: TOTAL KNEE ARTHROPLASTY;  Surgeon: Earlie Server, MD;  Location: [facility];  Service: Orthopedics;  Laterality: Right;   TUBAL LIGATION      Current Outpatient Medications  Medication Sig Dispense Refill   acetaminophen (TYLENOL) 650  MG CR tablet Take 1,300 mg by mouth every 8 (eight) hours as needed for pain.     amLODipine (NORVASC) 2.5 MG tablet Take 1 tablet (2.5 mg total) by mouth daily. 90 tablet 0   antiseptic oral rinse (BIOTENE) LIQD 15 mLs by Mouth Rinse route daily as needed for dry mouth.     Ascorbic Acid (VITAMIN C) 1000 MG tablet Take 1,000 mg by mouth daily.     aspirin 81 MG tablet Take 1 tablet (81 mg total) by mouth 2 (two) times daily. X30 days then may resume your normal once daily dose (Patient taking differently: Take 81 mg by mouth daily.) 60 tablet 0   busPIRone (BUSPAR) 5 MG tablet Take 5 mg by mouth 2 (two) times daily as needed (anxiety).     Cholecalciferol (VITAMIN D) 50 MCG (2000 UT) tablet Take 2,000 Units by mouth daily.     dicyclomine (BENTYL) 20 MG tablet Take 1 tablet (20 mg total) by mouth 2 (two) times daily. 20 tablet 0   DULoxetine (CYMBALTA) 60 MG  capsule Take 60 mg by mouth daily.  3   isosorbide mononitrate (IMDUR) 30 MG 24 hr tablet TAKE 1 TABLET(30 MG) BY MOUTH DAILY (Patient taking differently: Take 30 mg by mouth daily.) 90 tablet 1   LORazepam (ATIVAN) 0.5 MG tablet Take 0.5 mg by mouth at bedtime.     losartan (COZAAR) 100 MG tablet Take 1 tablet (100 mg total) by mouth daily. 90 tablet 1   mupirocin ointment (BACTROBAN) 2 % Apply 1 Application topically as needed.     nitroGLYCERIN (NITROSTAT) 0.4 MG SL tablet Place 1 tablet (0.4 mg total) under the tongue every 5 (five) minutes x 3 doses as needed for chest pain (if no relief after 2nd dose, proceed to ED or call 911). 25 tablet 3   pantoprazole (PROTONIX) 40 MG tablet Take 1 tablet (40 mg total) by mouth 2 (two) times daily before a meal. 60 tablet 1   polyethylene glycol (MIRALAX / GLYCOLAX) 17 g packet Take 17 g by mouth daily. 14 each 0   REPATHA SURECLICK 431 MG/ML SOAJ ADMINISTER 1 ML UNDER THE SKIN EVERY 14 DAYS AS DIRECTED (Patient taking differently: Inject 1 mL into the skin every 14 (fourteen) days.) 6 mL 3    SEMAGLUTIDE PO Take by mouth. Once per day filled at [facility]     SF 5000 PLUS 1.1 % CREA dental cream Take by mouth.     zinc gluconate 50 MG tablet Take 50 mg by mouth daily.     zolpidem (AMBIEN) 10 MG tablet Take 10 mg by mouth at bedtime.   3   No current facility-administered medications for this visit.    Allergies as of 09/07/2022 - Review Complete 09/07/2022  Allergen Reaction Noted   Ancef [cefazolin] Itching 04/01/2014   Ciprofloxacin hcl  03/02/2014   Meat [alpha-gal] Diarrhea    Metaxalone  03/03/2014   Morphine and related Itching 09/15/2021   Sulfa antibiotics Itching, Rash, and Other (See Comments) 09/16/2013    Family History  Problem Relation Age of Onset   Colon cancer Father    Heart disease Mother    Heart disease Other    Arthritis Other    Cancer Other    Allergic rhinitis Neg Hx    Angioedema Neg Hx    Asthma Neg Hx    Atopy Neg Hx    Eczema Neg Hx    Immunodeficiency Neg Hx    Urticaria Neg Hx     Social History   Socioeconomic History   Marital status: Married    Spouse name: Not on file   Number of children: Not on file   Years of education: Not on file   Highest education level: Not on file  Occupational History   Not on file  Tobacco Use   Smoking status: Former    Packs/day: 1.00    Years: 10.00    Total pack years: 10.00    Types: Cigarettes    Start date: 06/27/1979    Quit date: 11/27/2010    Years since quitting: 11.7   Smokeless tobacco: Never  Vaping Use   Vaping Use: Never used  Substance and Sexual Activity   Alcohol use: Yes    Alcohol/week: 0.0 standard drinks of alcohol    Comment: social   Drug use: Not Currently    Types: Marijuana   Sexual activity: Not on file  Other Topics Concern   Not on file  Social History Narrative   Not on file  Social Determinants of Health   Financial Resource Strain: Not on file  Food Insecurity: Not on file  Transportation Needs: Not on file  Physical Activity: Not on file   Stress: Not on file  Social Connections: Not on file   Review of systems General: negative for malaise, night sweats, fever, chills, weight loss Neck: Negative for lumps, goiter, pain and significant neck swelling Resp: Negative for cough, wheezing, dyspnea at rest CV: Negative for chest pain, leg swelling, palpitations, orthopnea GI: denies melena, hematochezia, nausea, vomiting, diarrhea, constipation, dysphagia, odyonophagia, early satiety or unintentional weight loss. +RUQ pain +Reflux symptoms MSK: Negative for joint pain or swelling, back pain, and muscle pain. Derm: Negative for itching or rash Psych: Denies depression, anxiety, memory loss, confusion. No homicidal or suicidal ideation.  Heme: Negative for prolonged bleeding, bruising easily, and swollen nodes. Endocrine: Negative for cold or heat intolerance, polyuria, polydipsia and goiter. Neuro: negative for tremor, gait imbalance, syncope and seizures. The remainder of the review of systems is noncontributory.  Physical Exam: BP (!) 153/74 (BP Location: Left Arm, Patient Position: Sitting, Cuff Size: Large)   Pulse 99   Temp 98.1 F (36.7 C) (Oral)   Ht '5\' 2"'$  (1.575 m)   Wt 199 lb 4.8 oz (90.4 kg)   BMI 36.45 kg/m  General:   Alert and oriented. No distress noted. Pleasant and cooperative.  Head:  Normocephalic and atraumatic. Eyes:  Conjuctiva clear without scleral icterus. Mouth:  Oral mucosa pink and moist. Good dentition. No lesions. Heart: Normal rate and rhythm, s1 and s2 heart sounds present.  Lungs: Clear lung sounds in all lobes. Respirations equal and unlabored. Abdomen:  +BS, soft, and non-distended. TTP of upper abdomen/RUQ. No rebound or guarding. No HSM or masses noted. Derm: No palmar erythema or jaundice Msk:  Symmetrical without gross deformities. Normal posture. Extremities:  Without edema. Neurologic:  Alert and  oriented x4 Psych:  Alert and cooperative. Normal mood and affect.  Invalid  input(s): "6 MONTHS"   ASSESSMENT: [name] is a 66 y.o. female presenting today for RUQ.  RUQ pain has been present for the past few years without definitive etiology, though she reports pain is worse recently, constant and with increase in reflux symptoms recently. CT A/P with contrast on 08/25/22 was unremarkable. Maintained on PPI BID, though usually only taking this once daily with breakthrough symptoms almost daily. Had EGD in nov 2022 with 1 cm hiatal hernia and presence of eosinophils but less than <15. Using bentyl without much improvement. Query if her symptoms are related to GERD/dyspepsia? Though she also has history of IBS and RUQ pain could very well be functional.  Will stop protonix and start omeprazole '40mg'$  BID. She should practice reflux precautions to include Avoiding greasy, spicy, fried, citrus foods, and be mindful that caffeine, carbonated drinks, chocolate and alcohol can increase reflux symptoms, Stay upright 2-3 hours after eating, prior to lying down and avoid eating late in the evenings. Will start levsin in place of bentyl q6h for abd pain. She will update me with how symptoms are in about a month, may consider low dose TCA if no improvement with above interventions.    PLAN:  Stop protonix  2. Start omeprazole '40mg'$  BID  3. Stop bentyl  4. Start levsin 0.'125mg'$  q6h PRN for abd pain 5. Reflux precautions 6. Consider low dose TCA if no improvement   All questions were answered, patient verbalized understanding and is in agreement with plan as outlined above.  Follow Up: 3 months   Leshea Jaggers L. Alver Sorrow, MSN, APRN, AGNP-C Adult-Gerontology Nurse Practitioner [facility]  I have reviewed the note and agree with the APP's assessment as described in this progress note  Maylon Peppers, MD Gastroenterology and Hepatology [facility]

## 2022-09-07 NOTE — Patient Instructions (Addendum)
Please stop protonix I have sent Omeprazole '40mg'$  to your pharmacy Please take this 30 minutes prior to breakfast and again 30 minutes prior to dinner Avoid greasy, spicy, fried, citrus foods, and be mindful that caffeine, carbonated drinks, chocolate and alcohol can increase reflux symptoms Stay upright 2-3 hours after eating, prior to lying down and avoid eating late in the evenings. Stop bentyl and start levsin every 6 hours as needed for pain, I have provided good rx card for this and should be around $10  Follow up 3 months

## 2022-09-14 ENCOUNTER — Ambulatory Visit: Payer: Self-pay | Admitting: "Endocrinology

## 2022-09-28 ENCOUNTER — Ambulatory Visit (INDEPENDENT_AMBULATORY_CARE_PROVIDER_SITE_OTHER): Payer: BC Managed Care – PPO | Admitting: Gastroenterology

## 2022-10-04 ENCOUNTER — Other Ambulatory Visit: Payer: Self-pay | Admitting: Cardiology

## 2022-10-05 ENCOUNTER — Telehealth (INDEPENDENT_AMBULATORY_CARE_PROVIDER_SITE_OTHER): Payer: Self-pay | Admitting: *Deleted

## 2022-10-05 DIAGNOSIS — R1011 Right upper quadrant pain: Secondary | ICD-10-CM

## 2022-10-05 NOTE — Telephone Encounter (Signed)
Patient called and states she is not better. Pain on right upper side is worse than when seen. She is taking omeprazole '40mg'$  one bid and has tried levsin for pain. Reports dicyclomine or levsin has not helped. She felt like she would go to ED last night but didn't. She states you mentioned doing a scan if not better. She thought it was an ultrasound. She would like to get that scheduled. No fever, no nausea, no vomiting. Reports she was constipated but has had several stools today.

## 2022-10-05 NOTE — Telephone Encounter (Signed)
Discussed with patient and she does want to proceed with u/s. I clarified with Vikki Ports what type of u/s and she said u/s abdomen complete.patient aware she will be contacted to schedule.

## 2022-10-05 NOTE — Telephone Encounter (Signed)
[place] scheduled for 11/16, arrival 8:15am, npo midnight.  Called pt and she is aware of appt details. She voiced understanding and had no questions

## 2022-10-08 ENCOUNTER — Other Ambulatory Visit: Payer: Self-pay | Admitting: Cardiology

## 2022-10-09 ENCOUNTER — Emergency Department (HOSPITAL_COMMUNITY)
Admission: EM | Admit: 2022-10-09 | Discharge: 2022-10-09 | Disposition: A | Discharge status: Home / Self Care | Payer: BC Managed Care – PPO | Attending: Emergency Medicine | Admitting: Emergency Medicine

## 2022-10-09 ENCOUNTER — Encounter (HOSPITAL_COMMUNITY): Payer: Self-pay

## 2022-10-09 ENCOUNTER — Emergency Department (HOSPITAL_COMMUNITY): Payer: BC Managed Care – PPO

## 2022-10-09 ENCOUNTER — Other Ambulatory Visit: Payer: Self-pay

## 2022-10-09 DIAGNOSIS — M5441 Lumbago with sciatica, right side: Secondary | ICD-10-CM | POA: Insufficient documentation

## 2022-10-09 DIAGNOSIS — R109 Unspecified abdominal pain: Secondary | ICD-10-CM | POA: Insufficient documentation

## 2022-10-09 DIAGNOSIS — I2510 Atherosclerotic heart disease of native coronary artery without angina pectoris: Secondary | ICD-10-CM | POA: Diagnosis not present

## 2022-10-09 DIAGNOSIS — Z7982 Long term (current) use of aspirin: Secondary | ICD-10-CM | POA: Insufficient documentation

## 2022-10-09 LAB — URINALYSIS, ROUTINE W REFLEX MICROSCOPIC
Bilirubin Urine: NEGATIVE
Glucose, UA: NEGATIVE mg/dL
Hgb urine dipstick: NEGATIVE
Ketones, ur: NEGATIVE mg/dL
Leukocytes,Ua: NEGATIVE
Nitrite: NEGATIVE
Protein, ur: NEGATIVE mg/dL
Specific Gravity, Urine: 1.027 (ref 1.005–1.030)
pH: 5 (ref 5.0–8.0)

## 2022-10-09 MED ORDER — PREDNISONE 10 MG PO TABS: 40.0000 mg | ORAL_TABLET | Freq: Every day | ORAL | 0 refills | Status: DC

## 2022-10-09 MED ORDER — PREDNISONE 50 MG PO TABS
60.0000 mg | ORAL_TABLET | Freq: Once | ORAL | Status: AC
  Administered 2022-10-09: 60 mg via ORAL
  Filled 2022-10-09: qty 1

## 2022-10-09 MED ORDER — ONDANSETRON HCL 4 MG/2ML IJ SOLN
4.0000 mg | Freq: Once | INTRAMUSCULAR | Status: AC
  Administered 2022-10-09: 4 mg via INTRAVENOUS
  Filled 2022-10-09: qty 2

## 2022-10-09 MED ORDER — HYDROMORPHONE HCL 1 MG/ML IJ SOLN
1.0000 mg | Freq: Once | INTRAMUSCULAR | Status: AC
  Administered 2022-10-09: 1 mg via INTRAVENOUS
  Filled 2022-10-09: qty 1

## 2022-10-09 NOTE — ED Provider Notes (Signed)
[facility] EMERGENCY DEPARTMENT Provider Note   CSN: [id number] Arrival date & time: 10/09/22  1607     History {Add pertinent medical, surgical, social history, OB history to HPI:1} Chief Complaint  Patient presents with   Flank Pain    [name] is a 66 y.o. female.  HPI     Home Medications Prior to Admission medications   Medication Sig Start Date End Date Taking? Authorizing Provider  acetaminophen (TYLENOL) 650 MG CR tablet Take 1,300 mg by mouth every 8 (eight) hours as needed for pain.    [provider]  amLODipine (NORVASC) 2.5 MG tablet TAKE 1 TABLET(2.5 MG) BY MOUTH DAILY 10/04/22   Satira Sark, MD  antiseptic oral rinse (BIOTENE) LIQD 15 mLs by Mouth Rinse route daily as needed for dry mouth.    [provider]  Ascorbic Acid (VITAMIN C) 1000 MG tablet Take 1,000 mg by mouth daily.    [provider]  aspirin 81 MG tablet Take 1 tablet (81 mg total) by mouth 2 (two) times daily. X30 days then may resume your normal once daily dose Patient taking differently: Take 81 mg by mouth daily. 11/25/21   [place], Vonna Kotyk, PA-C  busPIRone (BUSPAR) 5 MG tablet Take 5 mg by mouth 2 (two) times daily as needed (anxiety). 09/22/19   [provider]  Cholecalciferol (VITAMIN D) 50 MCG (2000 UT) tablet Take 2,000 Units by mouth daily.    [provider]  DULoxetine (CYMBALTA) 60 MG capsule Take 60 mg by mouth daily. 04/06/15   [provider]  hyoscyamine (LEVSIN SL) 0.125 MG SL tablet Place 1 tablet (0.125 mg total) under the tongue every 6 (six) hours as needed. 09/07/22   Carlan, Chelsea L, NP  isosorbide mononitrate (IMDUR) 30 MG 24 hr tablet TAKE 1 TABLET(30 MG) BY MOUTH DAILY 10/09/22   Satira Sark, MD  LORazepam (ATIVAN) 0.5 MG tablet Take 0.5 mg by mouth at bedtime.    [provider]  losartan (COZAAR) 100 MG tablet Take 1 tablet (100 mg total) by mouth daily. 08/11/22   Satira Sark, MD   mupirocin ointment (BACTROBAN) 2 % Apply 1 Application topically as needed. 05/31/22   [provider]  nitroGLYCERIN (NITROSTAT) 0.4 MG SL tablet Place 1 tablet (0.4 mg total) under the tongue every 5 (five) minutes x 3 doses as needed for chest pain (if no relief after 2nd dose, proceed to ED or call 911). 09/06/22   Satira Sark, MD  omeprazole (PRILOSEC) 40 MG capsule TAKE 1 CAPSULE(40 MG) BY MOUTH IN THE MORNING AND AT BEDTIME 09/07/22   Carlan, Chelsea L, NP  polyethylene glycol (MIRALAX / GLYCOLAX) 17 g packet Take 17 g by mouth daily. 09/19/21   Orson Eva, MD  REPATHA SURECLICK 683 MG/ML SOAJ ADMINISTER 1 ML UNDER THE SKIN EVERY 14 DAYS AS DIRECTED Patient taking differently: Inject 1 mL into the skin every 14 (fourteen) days. 01/03/22   Satira Sark, MD  SEMAGLUTIDE PO Take by mouth. Once per day filled at [facility]    [provider]  SF 5000 PLUS 1.1 % CREA dental cream Take by mouth. 06/08/22   [provider]  zinc gluconate 50 MG tablet Take 50 mg by mouth daily.    [provider]  zolpidem (AMBIEN) 10 MG tablet Take 10 mg by mouth at bedtime.  03/23/16   [provider]      Allergies    Ancef [cefazolin],  Ciprofloxacin hcl, Meat [alpha-gal], Metaxalone, Morphine and related, and Sulfa antibiotics    Review of Systems   Review of Systems  Physical Exam Updated Vital Signs BP (!) 149/95 (BP Location: Right Arm)   Pulse (!) 108   Temp 98.6 F (37 C) (Oral)   Resp 18   Ht 1.575 m ('5\' 2"'$ )   Wt 90.7 kg   SpO2 96%   BMI 36.58 kg/m  Physical Exam  ED Results / Procedures / Treatments   Labs (all labs ordered are listed, but only abnormal results are displayed) Labs Reviewed  URINALYSIS, ROUTINE W REFLEX MICROSCOPIC    EKG None  Radiology No results found.  Procedures Procedures  {Document cardiac monitor, telemetry assessment procedure when appropriate:1}  Medications Ordered in ED Medications - No data  to display  ED Course/ Medical Decision Making/ A&P                           Medical Decision Making Amount and/or Complexity of Data Reviewed Labs: ordered.   ***  {Document critical care time when appropriate:1} {Document review of labs and clinical decision tools ie heart score, Chads2Vasc2 etc:1}  {Document your independent review of radiology images, and any outside records:1} {Document your discussion with family members, caretakers, and with consultants:1} {Document social determinants of health affecting pt's care:1} {Document your decision making why or why not admission, treatments were needed:1} Final Clinical Impression(s) / ED Diagnoses Final diagnoses:  None    Rx / DC Orders ED Discharge Orders     None

## 2022-10-09 NOTE — ED Triage Notes (Signed)
Pt presents to ED with complaints of right sided flank pain started 3 days ago, denies urinary symptoms.

## 2022-10-09 NOTE — Discharge Instructions (Addendum)
Take the prednisone as directed.  Contact your pain management specialist tomorrow for additional help with pain.  You are showing some signs consistent with early sciatica type nerve impingement on the left side.  Hopefully the steroids will help.

## 2022-10-09 NOTE — ED Notes (Signed)
EDP at bedside  

## 2022-10-12 ENCOUNTER — Ambulatory Visit (HOSPITAL_COMMUNITY): Payer: BC Managed Care – PPO

## 2022-11-13 ENCOUNTER — Emergency Department (HOSPITAL_COMMUNITY)
Admission: EM | Admit: 2022-11-13 | Discharge: 2022-11-13 | Disposition: A | Discharge status: Home / Self Care | Payer: BC Managed Care – PPO | Attending: Emergency Medicine | Admitting: Emergency Medicine

## 2022-11-13 ENCOUNTER — Emergency Department (HOSPITAL_COMMUNITY): Payer: BC Managed Care – PPO

## 2022-11-13 ENCOUNTER — Encounter (HOSPITAL_COMMUNITY): Payer: Self-pay

## 2022-11-13 ENCOUNTER — Telehealth (INDEPENDENT_AMBULATORY_CARE_PROVIDER_SITE_OTHER): Payer: Self-pay

## 2022-11-13 DIAGNOSIS — Z79899 Other long term (current) drug therapy: Secondary | ICD-10-CM | POA: Insufficient documentation

## 2022-11-13 DIAGNOSIS — K625 Hemorrhage of anus and rectum: Secondary | ICD-10-CM | POA: Diagnosis present

## 2022-11-13 DIAGNOSIS — K648 Other hemorrhoids: Secondary | ICD-10-CM

## 2022-11-13 DIAGNOSIS — K649 Unspecified hemorrhoids: Secondary | ICD-10-CM | POA: Insufficient documentation

## 2022-11-13 DIAGNOSIS — I10 Essential (primary) hypertension: Secondary | ICD-10-CM | POA: Diagnosis not present

## 2022-11-13 DIAGNOSIS — K219 Gastro-esophageal reflux disease without esophagitis: Secondary | ICD-10-CM | POA: Diagnosis not present

## 2022-11-13 LAB — CBC
HCT: 43 % (ref 36.0–46.0)
Hemoglobin: 14.2 g/dL (ref 12.0–15.0)
MCH: 30.5 pg (ref 26.0–34.0)
MCHC: 33 g/dL (ref 30.0–36.0)
MCV: 92.5 fL (ref 80.0–100.0)
Platelets: 306 10*3/uL (ref 150–400)
RBC: 4.65 MIL/uL (ref 3.87–5.11)
RDW: 13.9 % (ref 11.5–15.5)
WBC: 5.6 10*3/uL (ref 4.0–10.5)
nRBC: 0 % (ref 0.0–0.2)

## 2022-11-13 LAB — COMPREHENSIVE METABOLIC PANEL
ALT: 23 U/L (ref 0–44)
AST: 24 U/L (ref 15–41)
Albumin: 4.2 g/dL (ref 3.5–5.0)
Alkaline Phosphatase: 86 U/L (ref 38–126)
Anion gap: 9 (ref 5–15)
BUN: 10 mg/dL (ref 8–23)
CO2: 25 mmol/L (ref 22–32)
Calcium: 9.6 mg/dL (ref 8.9–10.3)
Chloride: 105 mmol/L (ref 98–111)
Creatinine, Ser: 0.7 mg/dL (ref 0.44–1.00)
GFR, Estimated: >60 mL/min (ref >60)
Glucose, Bld: 107 mg/dL — ABNORMAL HIGH (ref 70–99)
Potassium: 3.7 mmol/L (ref 3.5–5.1)
Sodium: 139 mmol/L (ref 135–145)
Total Bilirubin: 0.7 mg/dL (ref 0.3–1.2)
Total Protein: 7.6 g/dL (ref 6.5–8.1)

## 2022-11-13 LAB — PROTIME-INR
INR: 1 (ref 0.8–1.2)
Prothrombin Time: 12.6 seconds (ref 11.4–15.2)

## 2022-11-13 LAB — TYPE AND SCREEN
ABO/RH(D): O NEG
Antibody Screen: NEGATIVE

## 2022-11-13 MED ORDER — HYDROCODONE-ACETAMINOPHEN 5-325 MG PO TABS
1.0000 | ORAL_TABLET | Freq: Once | ORAL | Status: AC
  Administered 2022-11-13: 1 via ORAL
  Filled 2022-11-13: qty 1

## 2022-11-13 MED ORDER — IOHEXOL 300 MG/ML  SOLN
100.0000 mL | Freq: Once | INTRAMUSCULAR | Status: AC | PRN
  Administered 2022-11-13: 100 mL via INTRAVENOUS

## 2022-11-13 MED ORDER — ALBUTEROL SULFATE HFA 108 (90 BASE) MCG/ACT IN AERS
1.0000 | INHALATION_SPRAY | RESPIRATORY_TRACT | Status: DC | PRN
  Administered 2022-11-13: 2 via RESPIRATORY_TRACT
  Filled 2022-11-13: qty 6.7

## 2022-11-13 NOTE — ED Provider Triage Note (Signed)
Emergency Medicine Provider Triage Evaluation Note  [name] , a 66 y.o. female with past medical history of hypertension, coronary artery disease, fibromyalgia, recurrent diverticulitis, and chronic lower back pain was evaluated in triage.  Pt complains of right flank, right lower quadrant abdominal pain and bloody stools.  She has been complaining of pain of her right flank and right back for some time.  She has been referred to a pain management provider for her back pain.  This morning, she noticed pain of her right flank and right lower quadrant, she had a bowel movement that produced bright red blood and dark-colored stool.  She endorses some constipation and hard stools today as well.  States she has never seen blood in her stool before.  She notes history of kidney stone in the past, but this pain feels different.  Pain seems to be aggravated by movement.  Nothing makes it better.  She is currently being treated with doxycycline for bronchitis.  She denies any chest pain or shortness of breath at this time.  Abdominal pain is not aggravated by food intake.  She denies any fever, dysuria, and vomiting  Review of Systems  Positive: Right flank, right lower quadrant pain, bloody stools Negative: Fever, chills, vomiting, dysuria  Physical Exam  BP (!) 180/113   Pulse 93   Temp 98.4 F (36.9 C) (Oral)   Resp 20   Ht '5\' 2"'$  (1.575 m)   Wt 90.7 kg   SpO2 96%   BMI 36.58 kg/m  Gen:   Awake, no distress tearful Resp:  Normal effort  MSK:   Moves extremities without difficulty, no peripheral edema Other:  Tender to palpation of the right lower abdomen  Medical Decision Making  Medically screening exam initiated at 1:03 PM.  Appropriate orders placed.  [name] was informed that the remainder of the evaluation will be completed by another provider, this initial triage assessment does not replace that evaluation, and the importance of remaining in the ED until their evaluation is  complete.     Kem Parkinson, PA-C 11/13/22 1326

## 2022-11-13 NOTE — ED Triage Notes (Signed)
Pt states she was having BM this morning and noticed a mixture of bright red blood and black in her stool. Pt denies blood thinner use. Daily aspirin use of 81 mg. Also c/o R flank and back pain. Hx of kidney stones, although she states pain is dissimilar.

## 2022-11-13 NOTE — Telephone Encounter (Signed)
Patient called today states she is in pain with sever RUQ pain, she is passing bright red blood and had some dark stools also. She says the pain in abdomen is sharp and shooting into her back. She says she had a small bm this am, this is when she saw the blood. She denies fevers,but is having hot and cold sweats. Patient states she is just getting over the bronchitis and is currently on doxycycline. She says she was thinking of going to the Ed as she felt bad. I advised that if she is in severe pain she may need to head to the Ed, she says she plans on going to [facility].

## 2022-11-13 NOTE — Telephone Encounter (Signed)
Thanks for the update, agree with ER evaluation if pain is severe

## 2022-11-13 NOTE — Discharge Instructions (Addendum)
As discussed, source of rectal bleeding most likely secondary to internal hemorrhoid.  Is important to keep bowel movements soft but not runny.  Recommend using MiraLAX as needed to help soften stool.  Also recommended sitz bath's 15 minutes at a time for at least 3 times a day.  Recommend following up with general surgery outpatient for reevaluation of your symptoms.  Please not hesitate to return to the emergency department for worrisome signs and symptoms we discussed become apparent.

## 2022-11-13 NOTE — ED Provider Notes (Signed)
[facility] EMERGENCY DEPARTMENT Provider Note   CSN: [id number] Arrival date & time: 11/13/22  1147     History  Chief Complaint  Patient presents with   Hematochezia    [name] is a 66 y.o. female.  HPI   66 year old female presents emergency department with complaints of hematochezia and abdominal pain.  Patient reports chronic history of right-sided back/flank/abdominal pain of which she been evaluated multiple times via CT imaging on chart review with overall negative workup.  Patient with new onset left lower quadrant abdominal pain with subsequent hematochezia 1 episode this morning.  She has a bowel movement was nonpainful and noticed darker appearing stools along with a bright red blood streaks.  Denies any nausea, vomiting, urinary/vaginal symptoms, chest pain, shortness of breath.  Patient currently treating for acute bronchitis with doxycycline and steroids.  Past medical history significant for GERD, STEMI, malignancy, fibromyalgia, hypertension, hyperlipidemia, nephrolithiasis  Home Medications Prior to Admission medications   Medication Sig Start Date End Date Taking? Authorizing Provider  acetaminophen (TYLENOL) 650 MG CR tablet Take 1,300 mg by mouth every 8 (eight) hours as needed for pain.    [provider]  amLODipine (NORVASC) 2.5 MG tablet TAKE 1 TABLET(2.5 MG) BY MOUTH DAILY 10/04/22   Satira Sark, MD  antiseptic oral rinse (BIOTENE) LIQD 15 mLs by Mouth Rinse route daily as needed for dry mouth.    [provider]  Ascorbic Acid (VITAMIN C) 1000 MG tablet Take 1,000 mg by mouth daily.    [provider]  aspirin 81 MG tablet Take 1 tablet (81 mg total) by mouth 2 (two) times daily. X30 days then may resume your normal once daily dose Patient taking differently: Take 81 mg by mouth daily. 11/25/21   Chadwell, Vonna Kotyk, PA-C  busPIRone (BUSPAR) 5 MG tablet Take 5 mg by mouth 2 (two) times daily as needed (anxiety).  09/22/19   [provider]  Cholecalciferol (VITAMIN D) 50 MCG (2000 UT) tablet Take 2,000 Units by mouth daily.    [provider]  DULoxetine (CYMBALTA) 60 MG capsule Take 60 mg by mouth daily. 04/06/15   [provider]  hyoscyamine (LEVSIN SL) 0.125 MG SL tablet Place 1 tablet (0.125 mg total) under the tongue every 6 (six) hours as needed. 09/07/22   Carlan, Chelsea L, NP  isosorbide mononitrate (IMDUR) 30 MG 24 hr tablet TAKE 1 TABLET(30 MG) BY MOUTH DAILY 10/09/22   Satira Sark, MD  LORazepam (ATIVAN) 0.5 MG tablet Take 0.5 mg by mouth at bedtime.    [provider]  losartan (COZAAR) 100 MG tablet Take 1 tablet (100 mg total) by mouth daily. 08/11/22   Satira Sark, MD  mupirocin ointment (BACTROBAN) 2 % Apply 1 Application topically as needed. 05/31/22   [provider]  nitroGLYCERIN (NITROSTAT) 0.4 MG SL tablet Place 1 tablet (0.4 mg total) under the tongue every 5 (five) minutes x 3 doses as needed for chest pain (if no relief after 2nd dose, proceed to ED or call 911). 09/06/22   Satira Sark, MD  omeprazole (PRILOSEC) 40 MG capsule TAKE 1 CAPSULE(40 MG) BY MOUTH IN THE MORNING AND AT BEDTIME 09/07/22   Carlan, Chelsea L, NP  polyethylene glycol (MIRALAX / GLYCOLAX) 17 g packet Take 17 g by mouth daily. 09/19/21   Orson Eva, MD  predniSONE (DELTASONE) 10 MG tablet Take 4 tablets (40 mg total) by mouth daily. 10/09/22   Fredia Sorrow, MD  Maryelizabeth Kaufmann  SURECLICK 259 MG/ML SOAJ ADMINISTER 1 ML UNDER THE SKIN EVERY 14 DAYS AS DIRECTED Patient taking differently: Inject 1 mL into the skin every 14 (fourteen) days. 01/03/22   Satira Sark, MD  SEMAGLUTIDE PO Take by mouth. Once per day filled at [facility]    [provider]  SF 5000 PLUS 1.1 % CREA dental cream Take by mouth. 06/08/22   [provider]  zinc gluconate 50 MG tablet Take 50 mg by mouth daily.    [provider]  zolpidem (AMBIEN) 10 MG  tablet Take 10 mg by mouth at bedtime.  03/23/16   [provider]      Allergies    Ancef [cefazolin], Ciprofloxacin hcl, Meat [alpha-gal], Metaxalone, Morphine and related, and Sulfa antibiotics    Review of Systems   Review of Systems  All other systems reviewed and are negative.   Physical Exam Updated Vital Signs BP (!) 135/123   Pulse 96   Temp 98.4 F (36.9 C) (Oral)   Resp 20   Ht '5\' 2"'$  (1.575 m)   Wt 90.7 kg   SpO2 95%   BMI 36.58 kg/m  Physical Exam Vitals and nursing note reviewed. Exam conducted with a chaperone present.  Constitutional:      General: She is not in acute distress.    Appearance: She is well-developed.  HENT:     Head: Normocephalic and atraumatic.  Eyes:     Conjunctiva/sclera: Conjunctivae normal.  Cardiovascular:     Rate and Rhythm: Normal rate and regular rhythm.     Heart sounds: No murmur heard. Pulmonary:     Effort: Pulmonary effort is normal. No respiratory distress.     Breath sounds: Wheezing present.     Comments: Mild diffuse wheeze auscultated on respiratory exam. Abdominal:     Palpations: Abdomen is soft.     Tenderness: There is abdominal tenderness.     Comments: Left lower quadrant abdominal tenderness right flank tenderness.  Genitourinary:    Rectum: Mass: .NORMALMALE.     Comments: With no signs of external rectal bleeding noted.  No obvious external hemorrhoids palpable.  No palpable evidence of fissure.  Palpable mass noted during internal exam of the rectum associated with internal hemorrhoid.  Patient elicited no tenderness during exam. Musculoskeletal:        General: No swelling.     Cervical back: Neck supple.     Right lower leg: No edema.     Left lower leg: No edema.  Skin:    General: Skin is warm and dry.     Capillary Refill: Capillary refill takes less than 2 seconds.  Neurological:     Mental Status: She is alert.  Psychiatric:        Mood and Affect: Mood normal.     ED Results /  Procedures / Treatments   Labs (all labs ordered are listed, but only abnormal results are displayed) Labs Reviewed  COMPREHENSIVE METABOLIC PANEL - Abnormal; Notable for the following components:      Result Value   Glucose, Bld 107 (*)    All other components within normal limits  CBC  PROTIME-INR  POC OCCULT BLOOD, ED  TYPE AND SCREEN    EKG None  Radiology No results found.  Procedures Procedures    Medications Ordered in ED Medications  albuterol (VENTOLIN HFA) 108 (90 Base) MCG/ACT inhaler 1-2 puff (2 puffs Inhalation Given 11/13/22 1502)  iohexol (OMNIPAQUE) 300 MG/ML solution 100 mL (100  mLs Intravenous Contrast Given 11/13/22 1346)  HYDROcodone-acetaminophen (NORCO/VICODIN) 5-325 MG per tablet 1 tablet (1 tablet Oral Given 11/13/22 1502)    ED Course/ Medical Decision Making/ A&P                           Medical Decision Making Amount and/or Complexity of Data Reviewed Labs: ordered.  Risk Prescription drug management.   This patient presents to the ED for concern of abdominal pain, hematochezia, this involves an extensive number of treatment options, and is a complaint that carries with it a high risk of complications and morbidity.  The differential diagnosis includes mesenteric ischemia, hemorrhoid, anal fissure, diverticulitis, gastritis, peptic ulcer disease, SBO/LBO, nephrolithiasis, volvulus, CBD pathology, cholecystitis, urinary tract infection   Co morbidities that complicate the patient evaluation  See HPI   Additional history obtained:  Additional history obtained from EMR External records from outside source obtained and reviewed including hospital records   Lab Tests:  I Ordered, and personally interpreted labs.  The pertinent results include: No leukocytosis noted.  No evidence of anemia.  Platelets within range.  No electrolyte abnormalities noted.  No transaminitis.  No renal dysfunction.  PT/INR within normal limits.  Fecal occult  blood positive.   Imaging Studies ordered:  I ordered imaging studies including CT L-spine/CT abdomen pelvis I independently visualized and interpreted imaging which showed  CT L-spine: Subacute-chronic fracture of bridging anterior osteophyte at T12-L1.  No acute lumbar fracture.  Mild multilevel degenerative disc disease with moderate severe lower lumbar facet arthropathy.  Mild to moderate canal stenosis.  No visible impingement. CT abdomen pelvis: No acute finding abdomen or pelvis.  Left colonic diverticulosis without diverticulitis. Small hiatal Hernia.  Aortic atherosclerosis. I agree with the radiologist interpretation  Cardiac Monitoring: / EKG:  The patient was maintained on a cardiac monitor.  I personally viewed and interpreted the cardiac monitored which showed an underlying rhythm of: sinus rhythm   Consultations Obtained:  N/a   Problem List / ED Course / Critical interventions / Medication management  Hematochezia I ordered medication including Norco for pain, albuterol for wheeze Reevaluation of the patient after these medicines showed that the patient improved I have reviewed the patients home medicines and have made adjustments as needed   Social Determinants of Health:  For cigarette use.  Denies illicit drug use.   Test / Admission - Considered:  Abdominal pain/hematochezia Vitals signs significant for mild hypertension with blood pressure 135/123.  Recommend follow-up with primary care regarding outpatient blood pressure. Otherwise within normal range and stable throughout visit. Laboratory/imaging studies significant for: See above Patient symptoms of rectal bleeding secondary to internal hemorrhoid most likely given exam findings.  PT/INR within normal limits.  No evidence of acute anemia.  No clinical evidence of abnormal findings on CT abdomen pelvis.  Patient with chronic abdominal pain of which has not been sourced with multiple prior imaging  performed.  Patient recommended stool softening in the form of miralax as well as sitz bath's.  Patient given general surgical follow-up outpatient for addressing of internal hemorrhoids.  Treatment plan discussed in length with patient and she acknowledged understanding and was agreeable to said plan. Worrisome signs and symptoms were discussed with the patient, and the patient acknowledged understanding to return to the ED if noticed. Patient was stable upon discharge.          Final Clinical Impression(s) / ED Diagnoses Final diagnoses:  Internal hemorrhoid, bleeding  Rx / DC Orders ED Discharge Orders     None         Wilnette Kales, [place] 11/13/22 [place], [place], DO 11/17/22 [id number]

## 2022-12-11 ENCOUNTER — Encounter (INDEPENDENT_AMBULATORY_CARE_PROVIDER_SITE_OTHER): Payer: Self-pay | Admitting: Gastroenterology

## 2022-12-11 ENCOUNTER — Ambulatory Visit (INDEPENDENT_AMBULATORY_CARE_PROVIDER_SITE_OTHER): Payer: BC Managed Care – PPO | Admitting: Gastroenterology

## 2022-12-11 VITALS — BP 146/92 | HR 99 | Temp 98.2°F | Ht 62.0 in | Wt 204.3 lb

## 2022-12-11 DIAGNOSIS — K581 Irritable bowel syndrome with constipation: Secondary | ICD-10-CM | POA: Diagnosis not present

## 2022-12-11 DIAGNOSIS — K219 Gastro-esophageal reflux disease without esophagitis: Secondary | ICD-10-CM

## 2022-12-11 DIAGNOSIS — R1011 Right upper quadrant pain: Secondary | ICD-10-CM

## 2022-12-11 MED ORDER — PANTOPRAZOLE SODIUM 40 MG PO TBEC: 40.0000 mg | DELAYED_RELEASE_TABLET | Freq: Two times a day (BID) | ORAL | 3 refills | Status: DC

## 2022-12-11 MED ORDER — HYOSCYAMINE SULFATE 0.125 MG SL SUBL: 0.1250 mg | SUBLINGUAL_TABLET | Freq: Four times a day (QID) | SUBLINGUAL | 1 refills | Status: DC | PRN

## 2022-12-11 NOTE — Progress Notes (Unsigned)
Referring Provider: Lanelle Bal, PA-C Primary Care Physician:  Lanelle Bal, PA-C Primary GI Physician: Jenetta Downer   Chief Complaint  Patient presents with   RUQ pain    Follow up RUQ pain. Patient reports still having pain in RUQ and had been to ED several times for pain and once for rectal bleeding. Asking for refill on hysoscamine.    HPI:   [name] is a 67 y.o. female with past medical history of alpha gal sensitivity, anxiety, coronary artery disease status post stent placement, carotid stenosis, hyperlipidemia, nephrolithiasis, GERD, hypertension and IBS   Patient presenting today for follow up of RUQ pain   Last seen 08/2022, at that time, still having some RUQ pain, can move around some and belch which will help her pain. having more acid reflux at times as well. RUQ pain for a few years though recently getting worse. Reports she has RUQ pain almost all the time, pain starts as soon as she gets up and moves around.acid reflux symptoms are almost daily and she is eating later at night due to [place]. maintained on protonix '40mg'$ , supposed to take it BID but usually only takes this daily. She feels that RUQ pain is sometimes worse after eating. Avoiding spicy foods, does not eat red meat due to alpha gal. has taken bentyl without much relief. Denies constipation or diarrhea, she uses metamucil or miralax every day to every other day. She is s/p cholecystectomy.    Recommended stop protonix, start omeprazole '40mg'$  BID, stop bentyl, start levsin, consider low dose TCA if no improvement in RUQ Pain  Most recent imaging with CT A/P on 12/18 No acute findings in the abdomen or pelvis. Specifically, no findings to explain the patient's history of right lower quadrant pain and bloody stools. Left colonic diverticulosis without diverticulitis. Tiny hiatal hernia.  Aortic Atherosclerosis   Present: Patient states that she did not note much improvement with omeprazole '40mg'$  BID. She  started back on protonix '40mg'$  BID most day, some days she only does once a day. Doing well on this, rare breakthrough symptoms. Still having some belching at times. Denies flatulence. She has taken gas x on occasion with some relief. RUQ pain is continued. Movement still makes the RUQ worse. Does not seem to note worsening or better pain with eating. No nausea or vomiting. She did try levsin but did not notice much difference in this. Appetite is good. No weight loss.   She previously had some rectal bleeding, ER visit in mid December, she notes heavy, painless rectal bleeding that lasted one day. History of hemorrhoids though she reports no rectal pain, itching irritation. Has some constipation at times if she misses her miralax. Feels that once per week she has a good BM. She is doing 1 capful of miralax daily but does not feel that bowels are emptying well regularly. She is trying to do well with water intake but notes she is likely not drinking enough. She is taking a colace daily as well.    Last Colonoscopy:10/18/21 - One 5 mm polyp in the cecum-sessile serrated - Diverticulosis in the sigmoid colon. - Congested mucosa in the sigmoid colon. - Non-bleeding internal hemorrhoids. Last EGD:10/18/21 - No endoscopic esophageal abnormality to explain patient's dysphagia. Esophagus dilated. Biopsied.Increased eosinophils but <15, on PPI BID  - 1 cm hiatal hernia. - Normal stomach. - Normal examined duodenum.   Recommendations:  Repeat tcs in 3 years    Past Medical History:  Diagnosis Date  Acute ST elevation myocardial infarction (STEMI) of inferior wall ([place]) 2012   Anxiety    Arthritis    CAD (coronary artery disease)    DES to the proximal and distal RCA April 2012 in the setting of inferior STEMI; occluded mid to distal RCA with left-to-right collaterals 2018, managed medically   Cancer ([place])    Carotid stenosis    Less than 50% 8/11   Chronic low back pain    Depression     Diverticulitis    Diverticulosis    Essential hypertension    Fibromyalgia    GERD (gastroesophageal reflux disease) 03/02/2014   H/O hiatal hernia    History of kidney stones    Hyperlipidemia    Pneumonia    Urticaria     Past Surgical History:  Procedure Laterality Date   BIOPSY  10/18/2021   Procedure: BIOPSY;  Surgeon: Harvel Quale, MD;  [facility];  Service: Gastroenterology;;   CARDIAC SURGERY     stent placement   CHOLECYSTECTOMY     COLONOSCOPY N/A 04/01/2014   Procedure: COLONOSCOPY;  Surgeon: Rogene Houston, MD;  [facility];  Service: Endoscopy;  Laterality: N/A;  100   COLONOSCOPY WITH PROPOFOL N/A 10/18/2021   Procedure: COLONOSCOPY WITH PROPOFOL;  Surgeon: Harvel Quale, MD;  [facility];  Service: Gastroenterology;  Laterality: N/A;  8:30   Cyst removed from spine     ECTOPIC PREGNANCY SURGERY     ESOPHAGOGASTRODUODENOSCOPY N/A 04/01/2014   Procedure: ESOPHAGOGASTRODUODENOSCOPY (EGD);  Surgeon: Rogene Houston, MD;  [facility];  Service: Endoscopy;  Laterality: N/A;  100   ESOPHAGOGASTRODUODENOSCOPY N/A 11/03/2016   Procedure: ESOPHAGOGASTRODUODENOSCOPY (EGD);  Surgeon: Rogene Houston, MD;  [facility];  Service: Endoscopy;  Laterality: N/A;  2:45   ESOPHAGOGASTRODUODENOSCOPY (EGD) WITH PROPOFOL N/A 10/18/2021   Procedure: ESOPHAGOGASTRODUODENOSCOPY (EGD) WITH PROPOFOL;  Surgeon: Harvel Quale, MD;  [facility];  Service: Gastroenterology;  Laterality: N/A;   Heart stent     2012  x 2 stents   KNEE ARTHROPLASTY Right    LEFT HEART CATH AND CORONARY ANGIOGRAPHY N/A 07/03/2017   Procedure: LEFT HEART CATH AND CORONARY ANGIOGRAPHY;  Surgeon: Burnell Blanks, MD;  Location: [facility];  Service: Cardiovascular;  Laterality: N/A;   Left knee     Arthroscopic   Left wrist     POLYPECTOMY  10/18/2021   Procedure: POLYPECTOMY;  Surgeon:  Harvel Quale, MD;  [facility];  Service: Gastroenterology;;   Azzie Almas DILATION  10/18/2021   Procedure: Azzie Almas DILATION;  Surgeon: Montez Morita, Quillian Quince, MD;  [facility];  Service: Gastroenterology;;   TONSILLECTOMY AND ADENOIDECTOMY     TOTAL KNEE ARTHROPLASTY Left 09/26/2013   Procedure: TOTAL KNEE ARTHROPLASTY- LEFT;  Surgeon: [name]., MD;  Location: [facility];  Service: Orthopedics;  Laterality: Left;   TOTAL KNEE ARTHROPLASTY Right 11/25/2021   Procedure: TOTAL KNEE ARTHROPLASTY;  Surgeon: Earlie Server, MD;  Location: [facility];  Service: Orthopedics;  Laterality: Right;   TUBAL LIGATION      Current Outpatient Medications  Medication Sig Dispense Refill   acetaminophen (TYLENOL) 650 MG CR tablet Take 1,300 mg by mouth every 8 (eight) hours as needed for pain.     amLODipine (NORVASC) 2.5 MG tablet TAKE 1 TABLET(2.5 MG) BY MOUTH DAILY 90 tablet 2   antiseptic oral rinse (BIOTENE) LIQD 15 mLs by Mouth Rinse route  daily as needed for dry mouth.     Ascorbic Acid (VITAMIN C) 1000 MG tablet Take 1,000 mg by mouth daily.     aspirin 81 MG tablet Take 1 tablet (81 mg total) by mouth 2 (two) times daily. X30 days then may resume your normal once daily dose (Patient taking differently: Take 81 mg by mouth daily.) 60 tablet 0   buPROPion HCl (WELLBUTRIN XL PO) Take by mouth. Once daily     busPIRone (BUSPAR) 5 MG tablet Take 5 mg by mouth 2 (two) times daily as needed (anxiety).     Cholecalciferol (VITAMIN D) 50 MCG (2000 UT) tablet Take 2,000 Units by mouth daily.     DULoxetine (CYMBALTA) 60 MG capsule Take 60 mg by mouth daily.  3   isosorbide mononitrate (IMDUR) 30 MG 24 hr tablet TAKE 1 TABLET(30 MG) BY MOUTH DAILY 90 tablet 3   LORazepam (ATIVAN) 0.5 MG tablet Take 0.5 mg by mouth at bedtime.     losartan (COZAAR) 100 MG tablet Take 1 tablet (100 mg total) by mouth daily. 90 tablet 1   mupirocin ointment (BACTROBAN) 2 % Apply 1 Application  topically as needed.     nitroGLYCERIN (NITROSTAT) 0.4 MG SL tablet Place 1 tablet (0.4 mg total) under the tongue every 5 (five) minutes x 3 doses as needed for chest pain (if no relief after 2nd dose, proceed to ED or call 911). 25 tablet 3   pantoprazole (PROTONIX) 40 MG tablet Take 40 mg by mouth 2 (two) times daily.     polyethylene glycol (MIRALAX / GLYCOLAX) 17 g packet Take 17 g by mouth daily. 14 each 0   REPATHA SURECLICK 546 MG/ML SOAJ ADMINISTER 1 ML UNDER THE SKIN EVERY 14 DAYS AS DIRECTED (Patient taking differently: Inject 1 mL into the skin every 14 (fourteen) days.) 6 mL 3   SF 5000 PLUS 1.1 % CREA dental cream Take by mouth.     zinc gluconate 50 MG tablet Take 50 mg by mouth daily.     zolpidem (AMBIEN) 10 MG tablet Take 10 mg by mouth at bedtime.   3   hyoscyamine (LEVSIN SL) 0.125 MG SL tablet Place 1 tablet (0.125 mg total) under the tongue every 6 (six) hours as needed. (Patient not taking: Reported on 12/11/2022) 30 tablet 1   omeprazole (PRILOSEC) 40 MG capsule TAKE 1 CAPSULE(40 MG) BY MOUTH IN THE MORNING AND AT BEDTIME (Patient not taking: Reported on 12/11/2022) 180 capsule 0   No current facility-administered medications for this visit.    Allergies as of 12/11/2022 - Review Complete 12/11/2022  Allergen Reaction Noted   Ancef [cefazolin] Itching 04/01/2014   Ciprofloxacin hcl  03/02/2014   Meat [alpha-gal] Diarrhea    Metaxalone  03/03/2014   Morphine and related Itching 09/15/2021   Sulfa antibiotics Itching, Rash, and Other (See Comments) 09/16/2013    Family History  Problem Relation Age of Onset   Colon cancer Father    Heart disease Mother    Heart disease Other    Arthritis Other    Cancer Other    Allergic rhinitis Neg Hx    Angioedema Neg Hx    Asthma Neg Hx    Atopy Neg Hx    Eczema Neg Hx    Immunodeficiency Neg Hx    Urticaria Neg Hx     Social History   Socioeconomic History   Marital status: Married    Spouse name: Not on file    Number  of children: Not on file   Years of education: Not on file   Highest education level: Not on file  Occupational History   Not on file  Tobacco Use   Smoking status: Former    Packs/day: 1.00    Years: 10.00    Total pack years: 10.00    Types: Cigarettes    Start date: 06/27/1979    Quit date: 11/27/2010    Years since quitting: 12.0   Smokeless tobacco: Never  Vaping Use   Vaping Use: Never used  Substance and Sexual Activity   Alcohol use: Yes    Alcohol/week: 0.0 standard drinks of alcohol    Comment: social   Drug use: Not Currently    Types: Marijuana   Sexual activity: Not on file  Other Topics Concern   Not on file  Social History Narrative   Not on file   Social Determinants of Health   Financial Resource Strain: Not on file  Food Insecurity: Not on file  Transportation Needs: Not on file  Physical Activity: Not on file  Stress: Not on file  Social Connections: Not on file   Review of systems General: negative for malaise, night sweats, fever, chills, weight loss Neck: Negative for lumps, goiter, pain and significant neck swelling Resp: Negative for cough, wheezing, dyspnea at rest CV: Negative for chest pain, leg swelling, palpitations, orthopnea GI: denies melena, hematochezia, nausea, vomiting, diarrhea, dysphagia, odyonophagia, early satiety or unintentional weight loss. +RUQ Pain +constipation  MSK: Negative for joint pain or swelling, back pain, and muscle pain. Derm: Negative for itching or rash Psych: Denies depression, anxiety, memory loss, confusion. No homicidal or suicidal ideation.  Heme: Negative for prolonged bleeding, bruising easily, and swollen nodes. Endocrine: Negative for cold or heat intolerance, polyuria, polydipsia and goiter. Neuro: negative for tremor, gait imbalance, syncope and seizures. The remainder of the review of systems is noncontributory.  Physical Exam: BP (!) 146/92 (BP Location: Left Arm, Patient Position: Sitting,  Cuff Size: Large) Comment: 161/101 recheck  Pulse 99   Temp 98.2 F (36.8 C) (Oral)   Ht '5\' 2"'$  (1.575 m)   Wt 204 lb 4.8 oz (92.7 kg)   BMI 37.37 kg/m  General:   Alert and oriented. No distress noted. Pleasant and cooperative.  Head:  Normocephalic and atraumatic. Eyes:  Conjuctiva clear without scleral icterus. Mouth:  Oral mucosa pink and moist. Good dentition. No lesions. Heart: Normal rate and rhythm, s1 and s2 heart sounds present.  Lungs: Clear lung sounds in all lobes. Respirations equal and unlabored. Abdomen:  +BS, soft, non-tender and non-distended. No rebound or guarding. No HSM or masses noted. Derm: No palmar erythema or jaundice Msk:  Symmetrical without gross deformities. Normal posture. Extremities:  Without edema. Neurologic:  Alert and  oriented x4 Psych:  Alert and cooperative. Normal mood and affect.  Invalid input(s): "6 MONTHS"   ASSESSMENT: [name] is a 67 y.o. female presenting today for follow up of RUQ pain.  RUQ pain: Chronic in nature, present for the past few years, workup as above to include EGD/US, multiple CTs with no definitive findings to explain her pain. Change in PPI at last visit helped with her GERD though did not change RUQ pain. Movement makes her pain worse. Has tried bentyl without much result, levsin at last visit did not change her pain much either but felt it worked better than bentyl. Query if this is functional abdominal pain. She may benefit from low dose TCS (Remeron  $'15mg'b$ ) however she is on wellbutrin, Buspar and cymbalta already, she is seeing PCP today. Recommend she discuss addition or change to Remeron with her PCP as this may be useful for her anxiety/depression as well as her abdominal pain.   Constipation/rectal bleeding: Has some constipation at times if she misses her miralax. Feels that once per week she has a good BM. She is doing 1 capful of miralax daily and colace daily but does not feel that bowels are emptying  well regularly. 1 episode of rectal bleeding in December, none further, last Colonoscopy in November 2022 with internal hemorrhoids, bleeding likely secondary to hemorrhoidal bleeding in presence of constipation. Advised to limit toilet time, avoid straining, good water intake and increase miralax to BID.   The patient was found to have elevated blood pressure when vital signs were checked in the office. The blood pressure was rechecked by the nursing staff and it was found be persistently elevated >140/90 mmHg. I personally advised to the patient to follow up closely with PCP for hypertension control.   PLAN:   Increase miralax BID  Increase water intake, aim for 64 oz/day  3. Discuss Remeron with PCP  4. Avoid straining/limit toilet time  5. Continue Protonix '40mg'$  BID 6. Levsin q6h PRN  All questions were answered, patient verbalized understanding and is in agreement with plan as outlined above.    Follow Up: 6 months   Souleymane Saiki L. Alver Sorrow, MSN, APRN, AGNP-C Adult-Gerontology Nurse Practitioner [facility]  I have reviewed the note and agree with the APP's assessment as described in this progress note  Maylon Peppers, MD Gastroenterology and Hepatology [facility] Gastroenterology

## 2022-12-11 NOTE — Patient Instructions (Addendum)
I have resent protonix '40mg'$  twice daily I have also sent levsin (the spasm medicine) to take every 6 hours as needed for abdominal pain As discussed, you may benefit from a TCA (remeron) for your abdominal pain, given you are already on some other anxiety/depression medications, I do not feel comfortable adding this or replacing one of your other medications with it. Please talk to your PCP about whether they think this could replace one of your other anxiety/depression meds as it may also offer some benefit from your abdominal pain  Follow up 6 months

## 2022-12-12 ENCOUNTER — Telehealth: Payer: Self-pay | Admitting: Cardiology

## 2022-12-12 NOTE — Telephone Encounter (Signed)
Reports elevated BP that was revealed while at GI doctor and PCP office. Says BP taken at office yesterday were taken as soon as she sat down. Does not check BP at home. Says she will start checking home BP's now. Medications reviewed. Advised regularly monitor and record your blood pressure readings at home. Please use the same machine at the same time of day to check your readings and record them. Advised to wait at least 5-10 minutes after sitting before checking BP. Advised to contact our office in 1 week with home BP readings. Verbalized understanding of plan.

## 2022-12-12 NOTE — Telephone Encounter (Signed)
Pt c/o BP issue: STAT if pt c/o blurred vision, one-sided weakness or slurred speech  1. What are your last 5 BP readings? 162/92; 151/92  2. Are you having any other symptoms (ex. Dizziness, headache, blurred vision, passed out)? Sweating spells                3. What is your BP issue? High BP

## 2023-01-23 ENCOUNTER — Other Ambulatory Visit (HOSPITAL_COMMUNITY)
Admission: RE | Admit: 2023-01-23 | Discharge: 2023-01-23 | Disposition: A | Discharge status: Home / Self Care | Payer: BC Managed Care – PPO | Source: Ambulatory Visit | Attending: Adult Health | Admitting: Adult Health

## 2023-01-23 ENCOUNTER — Ambulatory Visit (INDEPENDENT_AMBULATORY_CARE_PROVIDER_SITE_OTHER): Payer: BC Managed Care – PPO | Admitting: Adult Health

## 2023-01-23 ENCOUNTER — Encounter: Payer: Self-pay | Admitting: Adult Health

## 2023-01-23 VITALS — BP 156/95 | HR 96 | Ht 62.0 in | Wt 205.0 lb

## 2023-01-23 DIAGNOSIS — Z1211 Encounter for screening for malignant neoplasm of colon: Secondary | ICD-10-CM

## 2023-01-23 DIAGNOSIS — Z01419 Encounter for gynecological examination (general) (routine) without abnormal findings: Secondary | ICD-10-CM | POA: Insufficient documentation

## 2023-01-23 DIAGNOSIS — Z1231 Encounter for screening mammogram for malignant neoplasm of breast: Secondary | ICD-10-CM | POA: Insufficient documentation

## 2023-01-23 LAB — HEMOCCULT GUIAC POC 1CARD (OFFICE): Fecal Occult Blood, POC: NEGATIVE

## 2023-01-23 NOTE — Progress Notes (Signed)
Patient ID: [name], female   DOB: 1956-11-21, 67 y.o.   MRN: [id number] History of Present Illness: [name] is a 67 year old white female,married,PM in for a well woman gyn exam and pap. She has chronic pain and back and right flank area, to see pain management.  PCP is Kimberly Bal PA.   Current Medications, Allergies, Past Medical History, Past Surgical History, Family History and Social History were reviewed in [employer] record.     Review of Systems: Patient denies any headaches, hearing loss, fatigue, blurred vision, shortness of breath, chest pain, abdominal pain, problems with bowel movements, urination, or intercourse(does not have often). Has chronic pain, does not have joy in her life she says. She does keep 73 yo grandson and loves that.  She denies any vaginal bleeding, and does have some hot flashes again.    Physical Exam:BP (!) 156/95 (BP Location: Right Arm, Patient Position: Sitting, Cuff Size: Normal)   Pulse 96   Ht '5\' 2"'$  (1.575 m)   Wt 205 lb (93 kg)   BMI 37.49 kg/m   General:  Well developed, well nourished, no acute distress Skin:  Warm and dry Neck:  Midline trachea, normal thyroid, good ROM, no lymphadenopathy,no carotid bruits heard Lungs; Clear to auscultation bilaterally Breast:  No dominant palpable mass, retraction, or nipple discharge Cardiovascular: Regular rate and rhythm Abdomen:  Soft, non tender, no hepatosplenomegaly, does have point tenderness right flank area.  Pelvic:  External genitalia is normal in appearance, no lesions.  The vagina is pale.  Urethra has no lesions or masses. The cervix is smooth, pap with HR HPV genotyping performed.  Uterus is felt to be normal size, shape, and contour.  No adnexal masses or tenderness noted.Bladder is non tender, no masses felt. Rectal: Good sphincter tone, no polyps, or hemorrhoids felt.  Hemoccult negative. Extremities/musculoskeletal:  No swelling or varicosities noted, no  clubbing or cyanosis Psych:  No mood changes, alert and cooperative,seems happy AA is 2 Fall risk is low    01/23/2023   10:28 AM 06/16/2019   10:30 AM  Depression screen PHQ 2/9  Decreased Interest 3 0  Down, Depressed, Hopeless 3 0  PHQ - 2 Score 6 0  Altered sleeping 3   Tired, decreased energy 3   Change in appetite 2   Feeling bad or failure about yourself  3   Trouble concentrating 2   Moving slowly or fidgety/restless 0   Suicidal thoughts 0   PHQ-9 Score 19        01/23/2023   10:30 AM  GAD 7 : Generalized Anxiety Score  Nervous, Anxious, on Edge 1  Control/stop worrying 1  Worry too much - different things 1  Trouble relaxing 3  Restless 2  Easily annoyed or irritable 2  Afraid - awful might happen 0  Total GAD 7 Score 10      Upstream - 01/23/23 1030       Pregnancy Intention Screening   Does the patient want to become pregnant in the next year? N/A    Does the patient's partner want to become pregnant in the next year? N/A    Would the patient like to discuss contraceptive options today? N/A      Contraception Wrap Up   Current Method --   PM   End Method --   PM   Contraception Counseling Provided No            Examination chaperoned by  Kimberly Squibb LPN   Impression and Plan: 1. Encounter for gynecological examination with Papanicolaou smear of cervix Pap sent Pap in 3 years if normal Physical with PCP in 1 year Labs with PCP  - Cytology - PAP( [facility])  2. Encounter for screening fecal occult blood testing Hemoccult was negative  - POCT occult blood stool  3. Screening mammogram for breast cancer Mammogram scheduled for her 01/29/23 at 11:45 at [facility]; Future

## 2023-01-25 LAB — CYTOLOGY - PAP
Adequacy: ABSENT
Comment: NEGATIVE
Diagnosis: NEGATIVE
High risk HPV: NEGATIVE

## 2023-01-29 ENCOUNTER — Ambulatory Visit (HOSPITAL_COMMUNITY): Payer: BC Managed Care – PPO

## 2023-02-05 ENCOUNTER — Other Ambulatory Visit: Payer: Self-pay | Admitting: *Deleted

## 2023-02-05 ENCOUNTER — Encounter (HOSPITAL_COMMUNITY): Payer: Self-pay

## 2023-02-05 ENCOUNTER — Ambulatory Visit (HOSPITAL_COMMUNITY)
Admission: RE | Admit: 2023-02-05 | Discharge: 2023-02-05 | Disposition: A | Discharge status: Home / Self Care | Payer: BC Managed Care – PPO | Source: Ambulatory Visit | Attending: Adult Health | Admitting: Adult Health

## 2023-02-05 DIAGNOSIS — Z1231 Encounter for screening mammogram for malignant neoplasm of breast: Secondary | ICD-10-CM | POA: Insufficient documentation

## 2023-02-05 MED ORDER — REPATHA SURECLICK 140 MG/ML ~~LOC~~ SOAJ: SUBCUTANEOUS | 3 refills | Status: DC

## 2023-03-16 ENCOUNTER — Other Ambulatory Visit (HOSPITAL_COMMUNITY): Payer: Self-pay | Admitting: Orthopedic Surgery

## 2023-03-16 DIAGNOSIS — M25561 Pain in right knee: Secondary | ICD-10-CM

## 2023-03-16 DIAGNOSIS — T84030A Mechanical loosening of internal right hip prosthetic joint, initial encounter: Secondary | ICD-10-CM

## 2023-03-20 ENCOUNTER — Other Ambulatory Visit (HOSPITAL_COMMUNITY): Payer: Self-pay | Admitting: Orthopedic Surgery

## 2023-03-20 ENCOUNTER — Encounter (HOSPITAL_COMMUNITY): Payer: BC Managed Care – PPO

## 2023-03-20 DIAGNOSIS — M25561 Pain in right knee: Secondary | ICD-10-CM

## 2023-03-20 DIAGNOSIS — T84032A Mechanical loosening of internal right knee prosthetic joint, initial encounter: Secondary | ICD-10-CM

## 2023-03-23 ENCOUNTER — Encounter (HOSPITAL_COMMUNITY)
Admission: RE | Admit: 2023-03-23 | Discharge: 2023-03-23 | Disposition: A | Discharge status: Home / Self Care | Payer: BC Managed Care – PPO | Source: Ambulatory Visit | Attending: Orthopedic Surgery | Admitting: Orthopedic Surgery

## 2023-03-23 DIAGNOSIS — T84032A Mechanical loosening of internal right knee prosthetic joint, initial encounter: Secondary | ICD-10-CM | POA: Diagnosis present

## 2023-03-23 DIAGNOSIS — M25561 Pain in right knee: Secondary | ICD-10-CM | POA: Insufficient documentation

## 2023-03-23 MED ORDER — TECHNETIUM TC 99M MEDRONATE IV KIT
20.0000 | PACK | Freq: Once | INTRAVENOUS | Status: AC | PRN
  Administered 2023-03-23: 20 via INTRAVENOUS

## 2023-04-04 ENCOUNTER — Other Ambulatory Visit (INDEPENDENT_AMBULATORY_CARE_PROVIDER_SITE_OTHER): Payer: Self-pay | Admitting: Gastroenterology

## 2023-04-24 ENCOUNTER — Telehealth: Payer: Self-pay | Admitting: Cardiology

## 2023-04-24 MED ORDER — ISOSORBIDE MONONITRATE ER 30 MG PO TB24: 30.0000 mg | ORAL_TABLET | Freq: Every day | ORAL | 3 refills | Status: DC

## 2023-04-24 NOTE — Telephone Encounter (Signed)
Advised that she is supposed to take imdur 30 mg daily. Refills.

## 2023-04-24 NOTE — Telephone Encounter (Signed)
Pt c/o medication issue:  1. Name of Medication:   isosorbide mononitrate (IMDUR) 30 MG 24 hr tablet    2. How are you currently taking this medication (dosage and times per day)? TAKE 1 TABLET(30 MG) BY MOUTH DAILY   3. Are you having a reaction (difficulty breathing--STAT)? No  4. What is your medication issue? Pt would like a callback regarding if she should still be taking this medication or not. Please advise

## 2023-04-24 NOTE — Telephone Encounter (Deleted)
Per last ov note from provider:  1. CAD status post DES to the proximal and distal RCA in 2012 in the setting of inferior STEMI.  The mid to distal RCA is known to be occluded with left-to-right collaterals as of 2018 and she has been clinically stable on medical therapy.  Continue aspirin, Norvasc, Imdur, Cozaar, Repatha, and as needed nitroglycerin which will be refilled.

## 2023-05-11 ENCOUNTER — Other Ambulatory Visit: Payer: Self-pay | Admitting: Cardiology

## 2023-06-11 ENCOUNTER — Encounter (INDEPENDENT_AMBULATORY_CARE_PROVIDER_SITE_OTHER): Payer: Self-pay | Admitting: Gastroenterology

## 2023-06-11 ENCOUNTER — Ambulatory Visit (INDEPENDENT_AMBULATORY_CARE_PROVIDER_SITE_OTHER): Payer: BC Managed Care – PPO | Admitting: Gastroenterology

## 2023-06-11 VITALS — BP 154/90 | HR 97 | Temp 98.3°F | Ht 62.0 in | Wt 201.3 lb

## 2023-06-11 DIAGNOSIS — R1011 Right upper quadrant pain: Secondary | ICD-10-CM | POA: Diagnosis not present

## 2023-06-11 DIAGNOSIS — K581 Irritable bowel syndrome with constipation: Secondary | ICD-10-CM | POA: Diagnosis not present

## 2023-06-11 DIAGNOSIS — K219 Gastro-esophageal reflux disease without esophagitis: Secondary | ICD-10-CM | POA: Diagnosis not present

## 2023-06-11 NOTE — Progress Notes (Unsigned)
Referring Provider: Lianne Moris, PA-C Primary Care Physician:  Lianne Moris, PA-C Primary GI Physician: Levon Hedger   Chief Complaint  Patient presents with   Irritable Bowel Syndrome    Follow up on IBS. Takes probiotic and stool softner every day. Takes miralax about every other day.    Gastroesophageal Reflux    Follow up on GERD. Takes protonix in the mornings once daily instead of twice daily as prescribed. States symptoms are good with once daily.    HPI:   [name] is a 67 y.o. female with past medical history of alpha gal sensitivity, anxiety, coronary artery disease status post stent placement, carotid stenosis, hyperlipidemia, nephrolithiasis, GERD, hypertension and IBS   Patient presenting today for follow up of IBS and GERD  Last seen January 2024, at that time did not have much improvement with omeprazole 40 mg twice daily and had restarted Protonix 40 mg twice daily on her own.  Having some belching at times.  Taking Gas-X on occasion with some relief.  She continues to have right upper quadrant pain.  Having some constipation as well, had recent ER visit for rectal bleeding in mid December that had spontaneously resolved.  Recommended to increase MiraLAX to twice daily, good water intake, discussed Remeron with her PCP (for function abdominal pain, on multiple other mental health meds), avoid straining and limit toilet time, continue Protonix 40 mg twice daily, Levsin every 6 hours as needed.  Present:  Having some stomach upset today after consuming dairy the past 2 days  She notes that she continues to have RUQ pain. She  notes that she has some pain in her back that seems worse when she has more BMs. She is taking a probiotic which has helped some with her constipation. She is using miralax usually every other day. Having on average about 4 BMs per day, though noting stools are formed. No further rectal bleeding or melena.   She is only on cymbalta now, some of  there other mental health meds were discontinued since last visit, however, she did not remember to discuss use of TCA with her PCP.   GERD Is well controlled on protonix 40mg , she is taking this only daily usually as she does not have any symptoms in the evening. She does note she has some scratchy throat for the past few months and states no cause has been found for this. She does not note acid regurgitation but is having some hoarseness upon waking. No dysphagia or odynophagia.   CT A/P with contrast: 10/2022  No acute findings in the abdomen or pelvis. Specifically, no findings to explain the patient's history of right lower quadrant pain and bloody stools. 2. Left colonic diverticulosis without diverticulitis. 3. Tiny hiatal hernia. Last Colonoscopy:10/18/21 - One 5 mm polyp in the cecum-sessile serrated - Diverticulosis in the sigmoid colon. - Congested mucosa in the sigmoid colon. - Non-bleeding internal hemorrhoids. Last EGD:10/18/21 - No endoscopic esophageal abnormality to explain patient's dysphagia. Esophagus dilated. Biopsied.Increased eosinophils but <15, on PPI BID  - 1 cm hiatal hernia. - Normal stomach. - Normal examined duodenum.   Recommendations:  Repeat tcs in 3 years   Past Medical History:  Diagnosis Date   Acute ST elevation myocardial infarction (STEMI) of inferior wall (HCC) 2012   Anxiety    Arthritis    CAD (coronary artery disease)    DES to the proximal and distal RCA April 2012 in the setting of inferior STEMI; occluded mid to distal  RCA with left-to-right collaterals 2018, managed medically   Cancer (HCC)    Carotid stenosis    Less than 50% 8/11   Chronic low back pain    Depression    Diverticulitis    Diverticulosis    Essential hypertension    Fibromyalgia    GERD (gastroesophageal reflux disease) 03/02/2014   H/O hiatal hernia    History of kidney stones    Hyperlipidemia    Pneumonia    Urticaria     Past Surgical History:   Procedure Laterality Date   BIOPSY  10/18/2021   Procedure: BIOPSY;  Surgeon: Dolores Frame, MD;  Location: [facility];  Service: Gastroenterology;;   CARDIAC SURGERY     stent placement   CHOLECYSTECTOMY     COLONOSCOPY N/A 04/01/2014   Procedure: COLONOSCOPY;  Surgeon: Malissa Hippo, MD;  Location: [facility];  Service: Endoscopy;  Laterality: N/A;  100   COLONOSCOPY WITH PROPOFOL N/A 10/18/2021   Procedure: COLONOSCOPY WITH PROPOFOL;  Surgeon: Dolores Frame, MD;  Location: [facility];  Service: Gastroenterology;  Laterality: N/A;  8:30   Cyst removed from spine     ECTOPIC PREGNANCY SURGERY     ESOPHAGOGASTRODUODENOSCOPY N/A 04/01/2014   Procedure: ESOPHAGOGASTRODUODENOSCOPY (EGD);  Surgeon: Malissa Hippo, MD;  [facility]: [facility];  Service: Endoscopy;  Laterality: N/A;  100   ESOPHAGOGASTRODUODENOSCOPY N/A 11/03/2016   Procedure: ESOPHAGOGASTRODUODENOSCOPY (EGD);  Surgeon: Malissa Hippo, MD;  [facility]: [facility];  Service: Endoscopy;  Laterality: N/A;  2:45   ESOPHAGOGASTRODUODENOSCOPY (EGD) WITH PROPOFOL N/A 10/18/2021   Procedure: ESOPHAGOGASTRODUODENOSCOPY (EGD) WITH PROPOFOL;  Surgeon: Dolores Frame, MD;  Location: [facility];  Service: Gastroenterology;  Laterality: N/A;   Heart stent     2012  x 2 stents   KNEE ARTHROPLASTY Right    LEFT HEART CATH AND CORONARY ANGIOGRAPHY N/A 07/03/2017   Procedure: LEFT HEART CATH AND CORONARY ANGIOGRAPHY;  Surgeon: Kathleene Hazel, MD;  Location: [facility];  Service: Cardiovascular;  Laterality: N/A;   Left knee     Arthroscopic   Left wrist     POLYPECTOMY  10/18/2021   Procedure: POLYPECTOMY;  Surgeon: Dolores Frame, MD;  Location: [facility];  Service: Gastroenterology;;   Gaspar Bidding DILATION  10/18/2021   Procedure: Gaspar Bidding DILATION;  Surgeon: Marguerita Merles, Reuel Boom, MD;  Location: [facility];  Service: Gastroenterology;;   TONSILLECTOMY AND  ADENOIDECTOMY     TOTAL KNEE ARTHROPLASTY Left 09/26/2013   Procedure: TOTAL KNEE ARTHROPLASTY- LEFT;  Surgeon: Thera Flake., MD;  Location: [facility];  Service: Orthopedics;  Laterality: Left;   TOTAL KNEE ARTHROPLASTY Right 11/25/2021   Procedure: TOTAL KNEE ARTHROPLASTY;  Surgeon: Frederico Hamman, MD;  Location: [facility];  Service: Orthopedics;  Laterality: Right;   TUBAL LIGATION      Current Outpatient Medications  Medication Sig Dispense Refill   amLODipine (NORVASC) 2.5 MG tablet TAKE 1 TABLET(2.5 MG) BY MOUTH DAILY 90 tablet 2   antiseptic oral rinse (BIOTENE) LIQD 15 mLs by Mouth Rinse route daily as needed for dry mouth.     aspirin 81 MG chewable tablet Chew by mouth daily.     DULoxetine (CYMBALTA) 60 MG capsule Take 60 mg by mouth daily.  3   Evolocumab (REPATHA SURECLICK) 140 MG/ML SOAJ ADMINISTER 1 ML UNDER THE SKIN EVERY 14 DAYS AS DIRECTED 6 mL 3   HYDROcodone-acetaminophen (NORCO) 10-325 MG tablet Take 1 tablet by mouth  every 6 (six) hours as needed.     hyoscyamine (LEVSIN SL) 0.125 MG SL tablet Place 1 tablet (0.125 mg total) under the tongue every 6 (six) hours as needed. 60 tablet 1   isosorbide mononitrate (IMDUR) 30 MG 24 hr tablet Take 1 tablet (30 mg total) by mouth daily. 90 tablet 3   losartan (COZAAR) 100 MG tablet TAKE 1 TABLET(100 MG) BY MOUTH DAILY 30 tablet 4   mupirocin ointment (BACTROBAN) 2 % Apply 1 Application topically as needed.     nitroGLYCERIN (NITROSTAT) 0.4 MG SL tablet Place 1 tablet (0.4 mg total) under the tongue every 5 (five) minutes x 3 doses as needed for chest pain (if no relief after 2nd dose, proceed to ED or call 911). 25 tablet 3   pantoprazole (PROTONIX) 40 MG tablet TAKE 1 TABLET(40 MG) BY MOUTH TWICE DAILY 60 tablet 3   polyethylene glycol (MIRALAX / GLYCOLAX) 17 g packet Take 17 g by mouth daily. (Patient taking differently: Take 17 g by mouth daily. Every other day as needed) 14 each 0   SF 5000 PLUS 1.1 % CREA dental cream Take by  mouth.     zolpidem (AMBIEN) 10 MG tablet Take 10 mg by mouth at bedtime.   3   No current facility-administered medications for this visit.    Allergies as of 06/11/2023 - Review Complete 06/11/2023  Allergen Reaction Noted   Ancef [cefazolin] Itching 04/01/2014   Ciprofloxacin hcl  03/02/2014   Meat [alpha-gal] Diarrhea    Metaxalone  03/03/2014   Morphine and codeine Itching 09/15/2021   Sulfa antibiotics Itching, Rash, and Other (See Comments) 09/16/2013    Family History  Problem Relation Age of Onset   Colon cancer Father    Heart disease Mother    Heart disease Other    Arthritis Other    Cancer Other    Allergic rhinitis Neg Hx    Angioedema Neg Hx    Asthma Neg Hx    Atopy Neg Hx    Eczema Neg Hx    Immunodeficiency Neg Hx    Urticaria Neg Hx     Social History   Socioeconomic History   Marital status: Married    Spouse name: Not on file   Number of children: Not on file   Years of education: Not on file   Highest education level: Not on file  Occupational History   Not on file  Tobacco Use   Smoking status: Former    Current packs/day: 0.00    Average packs/day: 1 pack/day for 31.4 years (31.4 ttl pk-yrs)    Types: Cigarettes    Start date: 06/27/1979    Quit date: 11/27/2010    Years since quitting: 12.5    Passive exposure: Past   Smokeless tobacco: Never  Vaping Use   Vaping status: Never Used  Substance and Sexual Activity   Alcohol use: Yes    Alcohol/week: 0.0 standard drinks of alcohol    Comment: social   Drug use: Not Currently    Types: Marijuana   Sexual activity: Not Currently    Birth control/protection: Post-menopausal  Other Topics Concern   Not on file  Social History Narrative   Not on file   Social Determinants of Health   Financial Resource Strain: Low Risk  (01/23/2023)   Overall Financial Resource Strain (CARDIA)    Difficulty of Paying Living Expenses: Not very hard  Food Insecurity: No Food Insecurity (01/23/2023)    Hunger Vital Sign  Worried About Programme researcher, broadcasting/film/video in the Last Year: Never true    Ran Out of Food in the Last Year: Never true  Transportation Needs: No Transportation Needs (01/23/2023)   PRAPARE - Administrator, Civil Service (Medical): No    Lack of Transportation (Non-Medical): No  Physical Activity: Inactive (01/23/2023)   Exercise Vital Sign    Days of Exercise per Week: 0 days    Minutes of Exercise per Session: 0 min  Stress: Stress Concern Present (01/23/2023)   [employer] of Occupational Health - Occupational Stress Questionnaire    Feeling of Stress : Very much  Social Connections: Moderately Integrated (01/23/2023)   Social Connection and Isolation Panel [NHANES]    Frequency of Communication with Friends and Family: More than three times a week    Frequency of Social Gatherings with Friends and Family: Twice a week    Attends Religious Services: 1 to 4 times per year    Active Member of [employer] or Organizations: No    Attends Engineer, structural: Never    Marital Status: Married    Review of systems General: negative for malaise, night sweats, fever, chills, weight loss Neck: Negative for lumps, goiter, pain and significant neck swelling Resp: Negative for cough, wheezing, dyspnea at rest CV: Negative for chest pain, leg swelling, palpitations, orthopnea GI: denies melena, hematochezia, nausea, vomiting, diarrhea, constipation, dysphagia, odyonophagia, early satiety or unintentional weight loss. +RUQ pain +scratchy throat/hoarseness  MSK: Negative for joint pain or swelling, back pain, and muscle pain. Derm: Negative for itching or rash Psych: Denies depression, anxiety, memory loss, confusion. No homicidal or suicidal ideation.  Heme: Negative for prolonged bleeding, bruising easily, and swollen nodes. Endocrine: Negative for cold or heat intolerance, polyuria, polydipsia and goiter. Neuro: negative for tremor, gait imbalance, syncope and  seizures. The remainder of the review of systems is noncontributory.  Physical Exam: BP (!) 154/90   Pulse 97   Temp 98.3 F (36.8 C) (Oral)   Ht 5\' 2"  (1.575 m)   Wt 201 lb 4.8 oz (91.3 kg)   BMI 36.82 kg/m  General:   Alert and oriented. No distress noted. Pleasant and cooperative.  Head:  Normocephalic and atraumatic. Eyes:  Conjuctiva clear without scleral icterus. Mouth:  Oral mucosa pink and moist. Good dentition. No lesions. Heart: Normal rate and rhythm, s1 and s2 heart sounds present.  Lungs: Clear lung sounds in all lobes. Respirations equal and unlabored. Abdomen:  +BS, soft, non-tender and non-distended. No rebound or guarding. No HSM or masses noted. Derm: No palmar erythema or jaundice Msk:  Symmetrical without gross deformities. Normal posture. Extremities:  Without edema. Neurologic:  Alert and  oriented x4 Psych:  Alert and cooperative. Normal mood and affect.  Invalid input(s): "6 MONTHS"   ASSESSMENT: Cyndra L Broz is a 67 y.o. female presenting today for follow up of IBS and GERD  GERD: on protonix 40mg  daily, not having heartburn or acid regurgitation, however she has noted a scratchy throat for the past few months with some hoarseness upon waking.  Query if she is having some silent reflux during the night.  Recommended she increase PPI to twice daily dosing to see if scratchy throat and hoarseness improved.  She denies any dysphagia or odynophagia.  RUQ Pain: she is s/p cholecystectomy in the past. Pain is chronic/intermittent, no real precipitating or alleviating factors. She has had workup to include EGD/US, multiple CTs with no definitive cause found to explain her  pain. Has tried bentyl and levsin which did not provide much relief. Query functional abdominal pain. I recommended she discuss TCA with her PCP given she was already on other mental health medications, she is currently just on cymbalta, however, I again recommended she discuss adding/switching  to amitriptyline/elavil with her PCP to see if this provides some relief of her RUQ pain.   IBS-C: Constipation improved with daily probiotic, she is using MiraLAX every other day, having about 4 solid bowel movements per day.  No rectal bleeding or melena.  Can continue with current regimen.   PLAN:  Increase PPI to BID dosing  2. Continue with probiotic daily, miralax every other day 3. Good reflux precautions 4.  Discuss elavil/amitriptyline with PCP  All questions were answered, patient verbalized understanding and is in agreement with plan as outlined above.   Follow Up: 6 months   Ceclia Koker L. Jeanmarie Hubert, MSN, APRN, AGNP-C Adult-Gerontology Nurse Practitioner [facility]  I have reviewed the note and agree with the APP's assessment as described in this progress note  Katrinka Blazing, MD Gastroenterology and Hepatology [facility]

## 2023-06-11 NOTE — Patient Instructions (Addendum)
Let's try increasing your protonix 40mg  back to twice daily to see if this helps with your scratchy throat Be mindful of greasy, spicy, fried, citrus foods, caffeine, carbonated drinks, chocolate and alcohol as these can increase reflux symptoms Stay upright 2-3 hours after eating, prior to lying down and avoid eating late in the evenings.  As discussed, sometimes we use a specific anti depressant (amitriptyline/elavil) for ongoing abdominal pain that cannot be explained by any specific findings, please discuss the potential to use this with your cymbalta or in place of to help with your continued right upper abdominal pain  Please continue with daily probiotic, I am providing the low FODMAP food guide as well   Follow up 6 months

## 2023-06-30 ENCOUNTER — Other Ambulatory Visit (INDEPENDENT_AMBULATORY_CARE_PROVIDER_SITE_OTHER): Payer: Self-pay | Admitting: Gastroenterology

## 2023-08-20 ENCOUNTER — Other Ambulatory Visit: Payer: Self-pay | Admitting: Cardiology

## 2023-10-19 ENCOUNTER — Other Ambulatory Visit: Payer: Self-pay | Admitting: Cardiology

## 2023-11-20 ENCOUNTER — Other Ambulatory Visit: Payer: Self-pay | Admitting: Cardiology

## 2023-12-13 ENCOUNTER — Ambulatory Visit (INDEPENDENT_AMBULATORY_CARE_PROVIDER_SITE_OTHER): Payer: PPO | Admitting: Gastroenterology

## 2023-12-13 ENCOUNTER — Encounter (INDEPENDENT_AMBULATORY_CARE_PROVIDER_SITE_OTHER): Payer: Self-pay | Admitting: Gastroenterology

## 2023-12-13 VITALS — BP 126/84 | HR 108 | Temp 97.8°F | Ht 62.0 in | Wt 210.9 lb

## 2023-12-13 DIAGNOSIS — K219 Gastro-esophageal reflux disease without esophagitis: Secondary | ICD-10-CM

## 2023-12-13 DIAGNOSIS — Z8719 Personal history of other diseases of the digestive system: Secondary | ICD-10-CM

## 2023-12-13 DIAGNOSIS — Z860101 Personal history of adenomatous and serrated colon polyps: Secondary | ICD-10-CM

## 2023-12-13 DIAGNOSIS — K581 Irritable bowel syndrome with constipation: Secondary | ICD-10-CM

## 2023-12-13 DIAGNOSIS — R1011 Right upper quadrant pain: Secondary | ICD-10-CM | POA: Diagnosis not present

## 2023-12-13 NOTE — Progress Notes (Addendum)
Referring Provider: Lianne Moris, PA-C Primary Care Physician:  Lianne Moris, PA-C Primary GI Physician: Dr. Levon Hedger   Chief Complaint  Patient presents with   Follow-up    Patient here today for a six month follow up. Patient says she is still having issues with Genella Rife and Ruq pain. She is taking pantoprazole 40 mg one to two per day, usually takes one per day unless she has spicy food. She has hyoscyamine that she uses occasionally,but feels it is not helpful.    HPI:   [name] is a 68 y.o. female with past medical history of alpha gal sensitivity, anxiety, coronary artery disease status post stent placement, carotid stenosis, hyperlipidemia, nephrolithiasis, GERD, hypertension and IBS.   Patient presenting today for follow up of IBS and GERD  Last seen July 2024, at that time  Patient need to have right upper quadrant pain which is chronic.  Sometimes pain radiates to the back.  Taking a probiotic which seems to help with constipation, using MiraLAX every other day.  Having about 4 BMs per day.  Having some hoarseness/scratchy throat on Protonix 40 mg daily  Recommend to increase PPI to twice daily dosing, continue probiotic daily, MiraLAX every other day, good reflux precautions, discussed Elavil/amitriptyline with PCP  Present: States she went to [employer] and had accupuncture to help with her alpha gal, she has seen improvement in her symptoms. She is having much less BMs per day now. She notes that she has had to strain some to defecate at times. She had an episode of rectal bleeding with blood noted in the toilet back in [place]. She has history of hemorrhoids but denies any rectal pain. She was previously taking miralax and stool softner but is only taking PRN as she is no longer on pain meds.   states she tried to stop her Remus Loffler but could not sleep, she has had severe insomnia since her heart attack years back. She is seeing her PCP in the next few weeks  to discuss other options. She has tried otc sleep aids and felt "drugged." She also wants to discuss going on a weight loss shot options and plans to do this with her PCP but wants insight on these today.  She continues to have some RUQ pain. GERD seems well controlled on protonix 40mg , taking this usually just once daily, unless she eats something spicy but usually avoids these things.    CT A/P with contrast: 10/2022  No acute findings in the abdomen or pelvis. Specifically, no findings to explain the patient's history of right lower quadrant pain and bloody stools. 2. Left colonic diverticulosis without diverticulitis. 3. Tiny hiatal hernia. Last Colonoscopy:10/18/21 - One 5 mm polyp in the cecum-sessile serrated - Diverticulosis in the sigmoid colon. - Congested mucosa in the sigmoid colon. - Non-bleeding internal hemorrhoids. Last EGD:10/18/21 - No endoscopic esophageal abnormality to explain patient's dysphagia. Esophagus dilated. Biopsied. Increased eosinophils but <15, on PPI BID  - 1 cm hiatal hernia. - Normal stomach. - Normal examined duodenum.   Recommendations:  Repeat tcs in 3 years   Past Medical History:  Diagnosis Date   Acute ST elevation myocardial infarction (STEMI) of inferior wall (HCC) 2012   Anxiety    Arthritis    CAD (coronary artery disease)    DES to the proximal and distal RCA April 2012 in the setting of inferior STEMI; occluded mid to distal RCA with left-to-right collaterals 2018, managed medically   Cancer (HCC)  Carotid stenosis    Less than 50% 8/11   Chronic low back pain    Depression    Diverticulitis    Diverticulosis    Essential hypertension    Fibromyalgia    GERD (gastroesophageal reflux disease) 03/02/2014   H/O hiatal hernia    History of kidney stones    Hyperlipidemia    Pneumonia    Urticaria     Past Surgical History:  Procedure Laterality Date   BIOPSY  10/18/2021   Procedure: BIOPSY;  Surgeon: Dolores Frame, MD;  [facility];  Service: Gastroenterology;;   CARDIAC SURGERY     stent placement   CHOLECYSTECTOMY     COLONOSCOPY N/A 04/01/2014   Procedure: COLONOSCOPY;  Surgeon: Malissa Hippo, MD;  [facility];  Service: Endoscopy;  Laterality: N/A;  100   COLONOSCOPY WITH PROPOFOL N/A 10/18/2021   Procedure: COLONOSCOPY WITH PROPOFOL;  Surgeon: Dolores Frame, MD;  [facility];  Service: Gastroenterology;  Laterality: N/A;  8:30   Cyst removed from spine     ECTOPIC PREGNANCY SURGERY     ESOPHAGOGASTRODUODENOSCOPY N/A 04/01/2014   Procedure: ESOPHAGOGASTRODUODENOSCOPY (EGD);  Surgeon: Malissa Hippo, MD;  [facility];  Service: Endoscopy;  Laterality: N/A;  100   ESOPHAGOGASTRODUODENOSCOPY N/A 11/03/2016   Procedure: ESOPHAGOGASTRODUODENOSCOPY (EGD);  Surgeon: Malissa Hippo, MD;  [facility];  Service: Endoscopy;  Laterality: N/A;  2:45   ESOPHAGOGASTRODUODENOSCOPY (EGD) WITH PROPOFOL N/A 10/18/2021   Procedure: ESOPHAGOGASTRODUODENOSCOPY (EGD) WITH PROPOFOL;  Surgeon: Dolores Frame, MD;  [facility];  Service: Gastroenterology;  Laterality: N/A;   Heart stent     2012  x 2 stents   KNEE ARTHROPLASTY Right    LEFT HEART CATH AND CORONARY ANGIOGRAPHY N/A 07/03/2017   Procedure: LEFT HEART CATH AND CORONARY ANGIOGRAPHY;  Surgeon: Kathleene Hazel, MD;  [facility]: [facility];  Service: Cardiovascular;  Laterality: N/A;   Left knee     Arthroscopic   Left wrist     POLYPECTOMY  10/18/2021   Procedure: POLYPECTOMY;  Surgeon: Dolores Frame, MD;  [facility];  Service: Gastroenterology;;   Gaspar Bidding DILATION  10/18/2021   Procedure: Gaspar Bidding DILATION;  Surgeon: Marguerita Merles, Reuel Boom, MD;  [facility];  Service: Gastroenterology;;   TONSILLECTOMY AND ADENOIDECTOMY     TOTAL KNEE ARTHROPLASTY Left 09/26/2013   Procedure: TOTAL KNEE ARTHROPLASTY-  LEFT;  Surgeon: Thera Flake., MD;  Location: [facility];  Service: Orthopedics;  Laterality: Left;   TOTAL KNEE ARTHROPLASTY Right 11/25/2021   Procedure: TOTAL KNEE ARTHROPLASTY;  Surgeon: Frederico Hamman, MD;  Location: [facility];  Service: Orthopedics;  Laterality: Right;   TUBAL LIGATION      Current Outpatient Medications  Medication Sig Dispense Refill   acetaminophen (TYLENOL) 650 MG CR tablet Take 650 mg by mouth every 8 (eight) hours as needed for pain.     amLODipine (NORVASC) 2.5 MG tablet TAKE 1 TABLET(2.5 MG) BY MOUTH DAILY 90 tablet 2   antiseptic oral rinse (BIOTENE) LIQD 15 mLs by Mouth Rinse route daily as needed for dry mouth.     aspirin 81 MG chewable tablet Chew by mouth daily.     DULoxetine (CYMBALTA) 60 MG capsule Take 60 mg by mouth daily.  3   Evolocumab (REPATHA SURECLICK) 140 MG/ML SOAJ ADMINISTER 1 ML UNDER THE SKIN EVERY 14 DAYS AS DIRECTED 6 mL 3   hyoscyamine (  LEVSIN SL) 0.125 MG SL tablet Place 1 tablet (0.125 mg total) under the tongue every 6 (six) hours as needed. 60 tablet 1   isosorbide mononitrate (IMDUR) 30 MG 24 hr tablet Take 1 tablet (30 mg total) by mouth daily. 90 tablet 3   losartan (COZAAR) 100 MG tablet TAKE 1 TABLET(100 MG) BY MOUTH DAILY 30 tablet 0   nitroGLYCERIN (NITROSTAT) 0.4 MG SL tablet Place 1 tablet (0.4 mg total) under the tongue every 5 (five) minutes x 3 doses as needed for chest pain (if no relief after 2nd dose, proceed to ED or call 911). 25 tablet 3   pantoprazole (PROTONIX) 40 MG tablet TAKE 1 TABLET(40 MG) BY MOUTH TWICE DAILY 180 tablet 0   polyethylene glycol (MIRALAX / GLYCOLAX) 17 g packet Take 17 g by mouth daily. (Patient taking differently: Take 17 g by mouth daily. Every other day as needed) 14 each 0   SF 5000 PLUS 1.1 % CREA dental cream Take by mouth.     zolpidem (AMBIEN) 10 MG tablet Take 10 mg by mouth at bedtime.   3   No current facility-administered medications for this visit.    Allergies as of 12/13/2023 -  Review Complete 12/13/2023  Allergen Reaction Noted   Ancef [cefazolin] Itching 04/01/2014   Ciprofloxacin hcl  03/02/2014   Meat [alpha-gal] Diarrhea    Metaxalone  03/03/2014   Morphine and codeine Itching 09/15/2021   Sulfa antibiotics Itching, Rash, and Other (See Comments) 09/16/2013    Family History  Problem Relation Age of Onset   Colon cancer Father    Heart disease Mother    Heart disease Other    Arthritis Other    Cancer Other    Allergic rhinitis Neg Hx    Angioedema Neg Hx    Asthma Neg Hx    Atopy Neg Hx    Eczema Neg Hx    Immunodeficiency Neg Hx    Urticaria Neg Hx     Social History   Socioeconomic History   Marital status: Married    Spouse name: Not on file   Number of children: Not on file   Years of education: Not on file   Highest education level: Not on file  Occupational History   Not on file  Tobacco Use   Smoking status: Former    Current packs/day: 0.00    Average packs/day: 1 pack/day for 31.4 years (31.4 ttl pk-yrs)    Types: Cigarettes    Start date: 06/27/1979    Quit date: 11/27/2010    Years since quitting: 13.0    Passive exposure: Past   Smokeless tobacco: Never  Vaping Use   Vaping status: Never Used  Substance and Sexual Activity   Alcohol use: Yes    Alcohol/week: 0.0 standard drinks of alcohol    Comment: social   Drug use: Yes    Types: Marijuana   Sexual activity: Not Currently    Birth control/protection: Post-menopausal  Other Topics Concern   Not on file  Social History Narrative   Not on file   Social Drivers of Health   Financial Resource Strain: Low Risk  (01/23/2023)   Overall Financial Resource Strain (CARDIA)    Difficulty of Paying Living Expenses: Not very hard  Food Insecurity: No Food Insecurity (01/23/2023)   Hunger Vital Sign    Worried About Running Out of Food in the Last Year: Never true    Ran Out of Food in the Last Year: Never true  Transportation Needs: No Transportation Needs (01/23/2023)    PRAPARE - Administrator, Civil Service (Medical): No    Lack of Transportation (Non-Medical): No  Physical Activity: Inactive (01/23/2023)   Exercise Vital Sign    Days of Exercise per Week: 0 days    Minutes of Exercise per Session: 0 min  Stress: Stress Concern Present (01/23/2023)   [employer] of Occupational Health - Occupational Stress Questionnaire    Feeling of Stress : Very much  Social Connections: Moderately Integrated (01/23/2023)   Social Connection and Isolation Panel [NHANES]    Frequency of Communication with Friends and Family: More than three times a week    Frequency of Social Gatherings with Friends and Family: Twice a week    Attends Religious Services: 1 to 4 times per year    Active Member of [employer] or Organizations: No    Attends Engineer, structural: Never    Marital Status: Married    Review of systems General: negative for malaise, night sweats, fever, chills, weight loss Neck: Negative for lumps, goiter, pain and significant neck swelling Resp: Negative for cough, wheezing, dyspnea at rest CV: Negative for chest pain, leg swelling, palpitations, orthopnea GI: denies melena, hematochezia, nausea, vomiting, diarrhea,  dysphagia, odyonophagia, early satiety or unintentional weight loss. +RUQ Pain +constipation  MSK: Negative for joint pain or swelling, back pain, and muscle pain. Derm: Negative for itching or rash Psych: Denies depression, anxiety, memory loss, confusion. No homicidal or suicidal ideation.  Heme: Negative for prolonged bleeding, bruising easily, and swollen nodes. Endocrine: Negative for cold or heat intolerance, polyuria, polydipsia and goiter. Neuro: negative for tremor, gait imbalance, syncope and seizures. The remainder of the review of systems is noncontributory.  Physical Exam: BP 126/84 (BP Location: Left Arm, Patient Position: Sitting, Cuff Size: Large)   Pulse (!) 108   Temp 97.8 F (36.6 C) (Temporal)    Ht 5\' 2"  (1.575 m)   Wt 210 lb 14.4 oz (95.7 kg)   BMI 38.57 kg/m  General:   Alert and oriented. No distress noted. Pleasant and cooperative.  Head:  Normocephalic and atraumatic. Eyes:  Conjuctiva clear without scleral icterus. Mouth:  Oral mucosa pink and moist. Good dentition. No lesions. Heart: Normal rate and rhythm, s1 and s2 heart sounds present.  Lungs: Clear lung sounds in all lobes. Respirations equal and unlabored. Abdomen:  +BS, soft, non-tender and non-distended. No rebound or guarding. No HSM or masses noted. Derm: No palmar erythema or jaundice Msk:  Symmetrical without gross deformities. Normal posture. Extremities:  Without edema. Neurologic:  Alert and  oriented x4 Psych:  Alert and cooperative. Normal mood and affect.  Invalid input(s): "6 MONTHS"   ASSESSMENT: [name] is a 68 y.o. female presenting today   GERD: well controlled on protonix 40mg  daily, discussed that she can try stepping down to every other day since symptoms are well managed at this time, if she were to have breakthrough would need to resume daily PPI dosing. Should continue with good reflux precautions.  IBS/alpha gal: had recent accupuncture for her alpha gal which seems to have helped with her bowel frequency. Having some harder stools now. Recommend she increase water intake, fruits, veggies and whole grains in her diet. If she continues to have harder stools, may need to resume her stool softener.   RUQ Pain: she is s/p cholecystectomy in the past. Pain is chronic/intermittent, no real precipitating or alleviating factors. She has had workup to include  EGD/US, multiple CTs with no definitive cause found to explain her pain. Has tried bentyl and levsin which did not provide much relief. Suspect this is functional abdominal pain. I recommended she discuss TCA with her PCP given she was already on other mental health medications, she is currently just on cymbalta, however, I again  recommended she discuss adding/switching to amitriptyline/elavil with her PCP to see if this provides some relief of her RUQ pain.   History of colon polyps/rectal bleeding: last TCS in 2022 with SS polyp. She had one episode of rectal bleeding 2-3 months ago. Does have history of hemorrhoids and notes more constipation recently. She is due for repeat TCS in November, however, if she has further rectal bleeding, would recommend getting her setup for colonoscopy sooner.    PLAN:  -continue protonix 40mg  daily, can try cutting back to every other day -good reflux precautions -discuss TCA with PCP in place of cymbalta for depression/functional abdominal pain -discuss weight loss injection/other weight loss options with PCP -colonoscopy due in November, consider repeating sooner if further rectal bleeding -Increase water intake, aim for atleast 64 oz per day Increase fruits, veggies and whole grains, kiwi and prunes are especially good for constipation  All questions were answered, patient verbalized understanding and is in agreement with plan as outlined above.   Follow Up: 6 months   Xerxes Agrusa L. Jeanmarie Hubert, MSN, APRN, AGNP-C Adult-Gerontology Nurse Practitioner [facility]  I have reviewed the note and agree with the APP's assessment as described in this progress note  Katrinka Blazing, MD Gastroenterology and Hepatology [facility]

## 2023-12-13 NOTE — Patient Instructions (Signed)
-  continue protonix 40mg  daily, can try cutting back to every other day -please discuss TCA (elavil/amitriptyline) with PCP in place of cymbalta for depression/functional abdominal pain -discuss weight loss shot with PCP if you are interested, though as we discussed, you may start with weight watchers as this can be a great program to help with weight loss  -colonoscopy due in November, please let me know if you have further rectal bleeding as we may consider doing this sooner -Increase water intake, aim for atleast 64 oz per day Increase fruits, veggies and whole grains, kiwi and prunes are especially good for constipation -if you are having to strain more to have a BM, you may want to add miralax back in or you could try benefiber 1-2T daily with a meal  Follow up 6 months  It was a pleasure to see you today. I want to create trusting relationships with patients and provide genuine, compassionate, and quality care. I truly value your feedback! please be on the lookout for a survey regarding your visit with me today. I appreciate your input about our visit and your time in completing this!    Nicolette Gieske L. Jeanmarie Hubert, MSN, APRN, AGNP-C Adult-Gerontology Nurse Practitioner [facility]astroenter[facility] at [facility]

## 2023-12-19 DIAGNOSIS — E782 Mixed hyperlipidemia: Secondary | ICD-10-CM | POA: Diagnosis not present

## 2023-12-19 DIAGNOSIS — Z6839 Body mass index (BMI) 39.0-39.9, adult: Secondary | ICD-10-CM | POA: Diagnosis not present

## 2023-12-19 DIAGNOSIS — M2550 Pain in unspecified joint: Secondary | ICD-10-CM | POA: Diagnosis not present

## 2023-12-19 DIAGNOSIS — G4700 Insomnia, unspecified: Secondary | ICD-10-CM | POA: Diagnosis not present

## 2023-12-19 DIAGNOSIS — R5383 Other fatigue: Secondary | ICD-10-CM | POA: Diagnosis not present

## 2023-12-19 DIAGNOSIS — F411 Generalized anxiety disorder: Secondary | ICD-10-CM | POA: Diagnosis not present

## 2023-12-24 ENCOUNTER — Encounter: Payer: Self-pay | Admitting: Cardiology

## 2023-12-26 ENCOUNTER — Ambulatory Visit: Payer: PPO | Admitting: Cardiology

## 2023-12-27 DIAGNOSIS — H524 Presbyopia: Secondary | ICD-10-CM | POA: Diagnosis not present

## 2023-12-27 DIAGNOSIS — H35362 Drusen (degenerative) of macula, left eye: Secondary | ICD-10-CM | POA: Diagnosis not present

## 2024-01-09 ENCOUNTER — Other Ambulatory Visit: Payer: Self-pay | Admitting: Cardiology

## 2024-01-10 ENCOUNTER — Telehealth: Payer: Self-pay | Admitting: Pharmacy Technician

## 2024-01-10 ENCOUNTER — Other Ambulatory Visit (HOSPITAL_COMMUNITY): Payer: Self-pay

## 2024-01-10 NOTE — Telephone Encounter (Signed)
Pharmacy Patient Advocate Encounter   Received notification from CoverMyMeds that prior authorization for Repatha is required/requested.   Insurance verification completed.   The patient is insured through [facility] ADVANTAGE/RX ADVANCE .   Per test claim: PA required; PA submitted to above mentioned insurance via CoverMyMeds Key/confirmation #/EOC [id number] Status is pending

## 2024-01-10 NOTE — Telephone Encounter (Signed)
Pharmacy Patient Advocate Encounter  Received notification from [facility] ADVANTAGE/RX ADVANCE that Prior Authorization for repatha has been APPROVED from 01/10/24 to 07/09/24   PA #/Case ID/Reference #: [id number]

## 2024-01-19 ENCOUNTER — Other Ambulatory Visit: Payer: Self-pay | Admitting: Cardiology

## 2024-01-19 ENCOUNTER — Other Ambulatory Visit (INDEPENDENT_AMBULATORY_CARE_PROVIDER_SITE_OTHER): Payer: Self-pay | Admitting: Gastroenterology

## 2024-01-21 NOTE — Telephone Encounter (Signed)
 Last ov notes says she is taking once per day.

## 2024-01-23 NOTE — Telephone Encounter (Signed)
 Pt last seen 12-13-23 and note states take one a day and try one every other day. Called patient and she states she is taking one per day and sometimes has to take one bid but not often.

## 2024-01-28 ENCOUNTER — Ambulatory Visit: Payer: BLUE CROSS/BLUE SHIELD | Admitting: Adult Health

## 2024-02-06 DIAGNOSIS — E782 Mixed hyperlipidemia: Secondary | ICD-10-CM | POA: Diagnosis not present

## 2024-02-06 DIAGNOSIS — F411 Generalized anxiety disorder: Secondary | ICD-10-CM | POA: Diagnosis not present

## 2024-02-06 DIAGNOSIS — G4700 Insomnia, unspecified: Secondary | ICD-10-CM | POA: Diagnosis not present

## 2024-02-06 DIAGNOSIS — M2550 Pain in unspecified joint: Secondary | ICD-10-CM | POA: Diagnosis not present

## 2024-02-06 DIAGNOSIS — Z6837 Body mass index (BMI) 37.0-37.9, adult: Secondary | ICD-10-CM | POA: Diagnosis not present

## 2024-03-03 ENCOUNTER — Encounter: Payer: Self-pay | Admitting: Adult Health

## 2024-03-03 ENCOUNTER — Ambulatory Visit: Admitting: Adult Health

## 2024-03-03 VITALS — BP 132/85 | HR 89 | Ht 62.0 in | Wt 198.5 lb

## 2024-03-03 DIAGNOSIS — Z01419 Encounter for gynecological examination (general) (routine) without abnormal findings: Secondary | ICD-10-CM | POA: Diagnosis not present

## 2024-03-03 DIAGNOSIS — Z1211 Encounter for screening for malignant neoplasm of colon: Secondary | ICD-10-CM | POA: Diagnosis not present

## 2024-03-03 DIAGNOSIS — Z1331 Encounter for screening for depression: Secondary | ICD-10-CM

## 2024-03-03 DIAGNOSIS — R1011 Right upper quadrant pain: Secondary | ICD-10-CM | POA: Diagnosis not present

## 2024-03-03 LAB — HEMOCCULT GUIAC POC 1CARD (OFFICE): Fecal Occult Blood, POC: NEGATIVE

## 2024-03-03 NOTE — Progress Notes (Signed)
 Patient ID: [name], female   DOB: 05-08-1956, 68 y.o.   MRN: [id number] History of Present Illness: [name] is a 68 year old white female, married, PM in for a well woman gyn exam. She is complaining pain in right side since starting wegovy in [place]  has had some vomiting but none in 2 weeks and now some pain in upper back. Has body aches.      Component Value Date/Time   DIAGPAP  01/23/2023 1025    - Negative for intraepithelial lesion or malignancy (NILM)   DIAGPAP  06/16/2019 0000    NEGATIVE FOR INTRAEPITHELIAL LESIONS OR MALIGNANCY.   HPVHIGH Negative 01/23/2023 1025   ADEQPAP  01/23/2023 1025    Satisfactory for evaluation; transformation zone component ABSENT.   ADEQPAP  06/16/2019 0000    Satisfactory for evaluation  endocervical/transformation zone component ABSENT.   PCP is Lianne Moris PA    Current Medications, Allergies, Past Medical History, Past Surgical History, Family History and Social History were reviewed in [employer] record.     Review of Systems: Patient denies any headaches, hearing loss, fatigue, blurred vision, shortness of breath, chest pain, or problems with bowel movements(has more BM), urination(seems to pee often), or intercourse(not active). No joint pain or mood swings.  See HPI for positives    Physical Exam:BP 132/85 (BP Location: Right Arm, Patient Position: Sitting, Cuff Size: Normal)   Pulse 89   Ht 5\' 2"  (1.575 m)   Wt 198 lb 8 oz (90 kg)   BMI 36.31 kg/m   General:  Well developed, well nourished, no acute distress Skin:  Warm and dry Neck:  Midline trachea, normal thyroid, good ROM, no lymphadenopathy,no carotid bruits heard  Lungs; Clear to auscultation bilaterally Breast:  No dominant palpable mass, retraction, or nipple discharge Cardiovascular: Regular rate and rhythm Abdomen:  Soft, no hepatosplenomegaly. +RUQ tenderness noted and some flank tenderness  Pelvic:  External genitalia is normal in  appearance, no lesions.  The vagina is pale. Urethra has no lesions or masses. The cervix is smooth.  Uterus is felt to be normal size, shape, and contour.  No adnexal masses or tenderness noted.Bladder is non tender, no masses felt. Rectal: Good sphincter tone, no polyps, + hemorrhoids felt.  Hemoccult negative. Extremities/musculoskeletal:  No swelling or varicosities noted, no clubbing or cyanosis Psych:  No mood changes, alert and cooperative,seems happy AA is 2 Fall risk is low    03/03/2024    1:29 PM 01/23/2023   10:28 AM 06/16/2019   10:30 AM  Depression screen PHQ 2/9  Decreased Interest 3 3 0  Down, Depressed, Hopeless 3 3 0  PHQ - 2 Score 6 6 0  Altered sleeping 3 3   Tired, decreased energy 3 3   Change in appetite 2 2   Feeling bad or failure about yourself  2 3   Trouble concentrating 2 2   Moving slowly or fidgety/restless 0 0   Suicidal thoughts 0 0   PHQ-9 Score 18 19    Stopped Cymbalta and is on zoloft 100 mg 1 daily with PCP    03/03/2024    1:29 PM 01/23/2023   10:30 AM  GAD 7 : Generalized Anxiety Score  Nervous, Anxious, on Edge 1 1  Control/stop worrying 1 1  Worry too much - different things 1 1  Trouble relaxing 1 3  Restless 0 2  Easily annoyed or irritable 1 2  Afraid - awful might happen 0  0  Total GAD 7 Score 5 10    Upstream - 03/03/24 1340       Pregnancy Intention Screening   Does the patient want to become pregnant in the next year? N/A    Does the patient's partner want to become pregnant in the next year? N/A    Would the patient like to discuss contraceptive options today? N/A      Contraception Wrap Up   Current Method Female Sterilization   PM   End Method Female Sterilization   PM   Contraception Counseling Provided No              Examination chaperoned by Malachy Mood LPN   Impression and plan: 1. Encounter for well woman exam with routine gynecological exam (Primary) Pap and physical in 1 year Mammogram was negative  02/08/23  Labs with PCP Colonoscopy per GI  2. Encounter for screening fecal occult blood testing Hemoccult was negative - POCT occult blood stool  3. RUQ pain +RUQ pain since starting Wegovy in February +tenderness today,  Call PCP about this today

## 2024-03-04 DIAGNOSIS — R5383 Other fatigue: Secondary | ICD-10-CM | POA: Diagnosis not present

## 2024-03-04 DIAGNOSIS — K5792 Diverticulitis of intestine, part unspecified, without perforation or abscess without bleeding: Secondary | ICD-10-CM | POA: Diagnosis not present

## 2024-03-04 DIAGNOSIS — R1011 Right upper quadrant pain: Secondary | ICD-10-CM | POA: Diagnosis not present

## 2024-03-04 DIAGNOSIS — Z6837 Body mass index (BMI) 37.0-37.9, adult: Secondary | ICD-10-CM | POA: Diagnosis not present

## 2024-03-05 ENCOUNTER — Other Ambulatory Visit (HOSPITAL_COMMUNITY): Payer: Self-pay | Admitting: Family Medicine

## 2024-03-05 DIAGNOSIS — Z1231 Encounter for screening mammogram for malignant neoplasm of breast: Secondary | ICD-10-CM

## 2024-03-19 ENCOUNTER — Encounter: Payer: Self-pay | Admitting: Cardiology

## 2024-03-19 ENCOUNTER — Ambulatory Visit (HOSPITAL_COMMUNITY)

## 2024-03-19 ENCOUNTER — Ambulatory Visit: Payer: PPO | Attending: Cardiology | Admitting: Cardiology

## 2024-03-19 VITALS — BP 132/86 | HR 93 | Ht 62.0 in | Wt 196.2 lb

## 2024-03-19 DIAGNOSIS — I10 Essential (primary) hypertension: Secondary | ICD-10-CM

## 2024-03-19 DIAGNOSIS — E782 Mixed hyperlipidemia: Secondary | ICD-10-CM | POA: Diagnosis not present

## 2024-03-19 DIAGNOSIS — T466X5D Adverse effect of antihyperlipidemic and antiarteriosclerotic drugs, subsequent encounter: Secondary | ICD-10-CM

## 2024-03-19 DIAGNOSIS — M791 Myalgia, unspecified site: Secondary | ICD-10-CM | POA: Diagnosis not present

## 2024-03-19 DIAGNOSIS — I25119 Atherosclerotic heart disease of native coronary artery with unspecified angina pectoris: Secondary | ICD-10-CM

## 2024-03-19 MED ORDER — NITROGLYCERIN 0.4 MG SL SUBL: 0.4000 mg | SUBLINGUAL_TABLET | SUBLINGUAL | 3 refills | Status: DC | PRN

## 2024-03-19 NOTE — Progress Notes (Signed)
    Cardiology Office Note  Date: 03/19/2024   ID: [name], [name] September 03, 1956, MRN [id number]  History of Present Illness: [name] is a 68 y.o. female last seen in October 2023.  She is here for a follow-up visit.  Reports no angina or interval nitroglycerin  use, stable NYHA class II dyspnea.  We went over her medications.  Cardiac regimen has been stable.  She does need a refill for fresh bottle of nitroglycerin .  She was started on Wegovy back in March by PCP.  I reviewed her interval lab work, LDL was 67 in January.  She continues on Repatha  as well.  I reviewed her ECG today which shows normal sinus rhythm, decreased R wave progression.  Physical Exam: VS:  BP 132/86   Pulse 93   Ht 5\' 2"  (1.575 m)   Wt 196 lb 3.2 oz (89 kg)   SpO2 96%   BMI 35.89 kg/m , BMI Body mass index is 35.89 kg/m.  Wt Readings from Last 3 Encounters:  03/19/24 196 lb 3.2 oz (89 kg)  03/03/24 198 lb 8 oz (90 kg)  12/13/23 210 lb 14.4 oz (95.7 kg)    General: Patient appears comfortable at rest. HEENT: Conjunctiva and lids normal. Neck: Supple, no elevated JVP or carotid bruits. Lungs: Clear to auscultation, nonlabored breathing at rest. Cardiac: Regular rate and rhythm, no S3 or significant systolic murmur. Extremities: No pitting edema.  ECG:  An ECG dated 11/17/2021 was personally reviewed today and demonstrated:  Sinus rhythm with decreased R wave progression.  Labwork:  January 2025: Cholesterol 151, glycerides 161, HDL 57, LDL 67 April 2025: BUN 13, creatinine 0.81, potassium 4.5, AST 17, ALT 14, hemoglobin 13.4, platelets 394  Other Studies Reviewed Today:  No interval cardiac testing for review today.  Assessment and Plan:  1.  CAD status post DES to the proximal and distal RCA in 2012 in the setting of inferior STEMI.  The mid to distal RCA is known to be occluded with left-to-right collaterals as of 2018.  She reports no angina or interval nitroglycerin  use.  ECG  reviewed.  Plan to continue medical therapy and observation.  Currently on aspirin  81 mg daily, Repatha  140 mg/mL every 14 days, Imdur  30 mg daily, and as needed nitroglycerin  which will be refilled.  She is also on Wegovy at this point.   2.  Mixed hyperlipidemia with statin myalgias.  She continues on Repatha  140 mg/mL every 14 days.  LDL 67 and HDL 57 in January.   3.  Primary hypertension.  Continue Norvasc  2.5 mg daily and Cozaar  100 mg daily.  Disposition:  Follow up  1 year.  Signed, Gerard Knight, M.D., F.A.C.C. [facility] at [facility]

## 2024-03-19 NOTE — Patient Instructions (Addendum)

## 2024-03-21 ENCOUNTER — Encounter (HOSPITAL_COMMUNITY): Payer: Self-pay

## 2024-03-21 ENCOUNTER — Ambulatory Visit (HOSPITAL_COMMUNITY)
Admission: RE | Admit: 2024-03-21 | Discharge: 2024-03-21 | Disposition: A | Discharge status: Home / Self Care | Source: Ambulatory Visit | Attending: Family Medicine | Admitting: Family Medicine

## 2024-03-21 DIAGNOSIS — Z1231 Encounter for screening mammogram for malignant neoplasm of breast: Secondary | ICD-10-CM | POA: Insufficient documentation

## 2024-03-30 ENCOUNTER — Other Ambulatory Visit: Payer: Self-pay | Admitting: Cardiology

## 2024-04-08 ENCOUNTER — Other Ambulatory Visit: Payer: Self-pay | Admitting: Cardiology

## 2024-05-15 DIAGNOSIS — G4700 Insomnia, unspecified: Secondary | ICD-10-CM | POA: Diagnosis not present

## 2024-05-15 DIAGNOSIS — F411 Generalized anxiety disorder: Secondary | ICD-10-CM | POA: Diagnosis not present

## 2024-05-15 DIAGNOSIS — E7849 Other hyperlipidemia: Secondary | ICD-10-CM | POA: Diagnosis not present

## 2024-05-15 DIAGNOSIS — Z6836 Body mass index (BMI) 36.0-36.9, adult: Secondary | ICD-10-CM | POA: Diagnosis not present

## 2024-05-15 DIAGNOSIS — M2550 Pain in unspecified joint: Secondary | ICD-10-CM | POA: Diagnosis not present

## 2024-05-15 DIAGNOSIS — E782 Mixed hyperlipidemia: Secondary | ICD-10-CM | POA: Diagnosis not present

## 2024-06-04 DIAGNOSIS — R1011 Right upper quadrant pain: Secondary | ICD-10-CM | POA: Diagnosis not present

## 2024-06-04 DIAGNOSIS — M545 Low back pain, unspecified: Secondary | ICD-10-CM | POA: Diagnosis not present

## 2024-06-04 DIAGNOSIS — Z6836 Body mass index (BMI) 36.0-36.9, adult: Secondary | ICD-10-CM | POA: Diagnosis not present

## 2024-06-04 DIAGNOSIS — K5792 Diverticulitis of intestine, part unspecified, without perforation or abscess without bleeding: Secondary | ICD-10-CM | POA: Diagnosis not present

## 2024-06-05 ENCOUNTER — Other Ambulatory Visit (HOSPITAL_COMMUNITY): Payer: Self-pay | Admitting: Family Medicine

## 2024-06-05 DIAGNOSIS — R1011 Right upper quadrant pain: Secondary | ICD-10-CM

## 2024-06-05 DIAGNOSIS — R1031 Right lower quadrant pain: Secondary | ICD-10-CM

## 2024-06-06 ENCOUNTER — Ambulatory Visit (HOSPITAL_COMMUNITY)
Admission: RE | Admit: 2024-06-06 | Discharge: 2024-06-06 | Disposition: A | Discharge status: Home / Self Care | Source: Ambulatory Visit | Attending: Family Medicine | Admitting: Family Medicine

## 2024-06-06 DIAGNOSIS — K5730 Diverticulosis of large intestine without perforation or abscess without bleeding: Secondary | ICD-10-CM | POA: Diagnosis not present

## 2024-06-06 DIAGNOSIS — N281 Cyst of kidney, acquired: Secondary | ICD-10-CM | POA: Diagnosis not present

## 2024-06-06 DIAGNOSIS — R1011 Right upper quadrant pain: Secondary | ICD-10-CM | POA: Insufficient documentation

## 2024-06-06 DIAGNOSIS — R1031 Right lower quadrant pain: Secondary | ICD-10-CM | POA: Diagnosis not present

## 2024-06-06 DIAGNOSIS — Z9049 Acquired absence of other specified parts of digestive tract: Secondary | ICD-10-CM | POA: Diagnosis not present

## 2024-06-06 MED ORDER — IOHEXOL 9 MG/ML PO SOLN
ORAL | Status: AC
  Filled 2024-06-06: qty 1000

## 2024-06-06 MED ORDER — IOHEXOL 300 MG/ML  SOLN
100.0000 mL | Freq: Once | INTRAMUSCULAR | Status: AC | PRN
  Administered 2024-06-06: 100 mL via INTRAVENOUS

## 2024-06-07 ENCOUNTER — Other Ambulatory Visit: Payer: Self-pay | Admitting: Cardiology

## 2024-06-12 ENCOUNTER — Encounter (INDEPENDENT_AMBULATORY_CARE_PROVIDER_SITE_OTHER): Payer: Self-pay | Admitting: Gastroenterology

## 2024-06-12 ENCOUNTER — Ambulatory Visit (INDEPENDENT_AMBULATORY_CARE_PROVIDER_SITE_OTHER): Payer: PPO | Admitting: Gastroenterology

## 2024-06-12 VITALS — BP 125/72 | HR 76 | Temp 97.6°F | Ht 62.0 in | Wt 191.2 lb

## 2024-06-12 DIAGNOSIS — R1011 Right upper quadrant pain: Secondary | ICD-10-CM | POA: Diagnosis not present

## 2024-06-12 DIAGNOSIS — K581 Irritable bowel syndrome with constipation: Secondary | ICD-10-CM

## 2024-06-12 DIAGNOSIS — Z860100 Personal history of colon polyps, unspecified: Secondary | ICD-10-CM

## 2024-06-12 DIAGNOSIS — K219 Gastro-esophageal reflux disease without esophagitis: Secondary | ICD-10-CM

## 2024-06-12 DIAGNOSIS — K589 Irritable bowel syndrome without diarrhea: Secondary | ICD-10-CM

## 2024-06-12 NOTE — Progress Notes (Addendum)
 Referring Provider: Dow Longs, PA-C Primary Care Physician:  Dow Longs, PA-C Primary GI Physician: Dr. Eartha   Chief Complaint  Patient presents with   Follow-up    Pt arrives for follow up. Pt had CT on 06/06/24 and states no one has went over it with her. Had really bad abdominal pain and is still going on. No chills/fever. Pt was on Augmentin  but did stop on 06/06/24 after CT. Went off [employer] a couple weeks ago. GERD is controlled, takes med daily. Pt has yeast infection from antibiotic.    HPI:   [name] is a 68 y.o. female with past medical history of alpha gal sensitivity, anxiety, coronary artery disease status post stent placement, carotid stenosis, hyperlipidemia, nephrolithiasis, GERD, hypertension and IBS.   Patient presenting today for:  Follow up of IBS, GERD and RUQ/R flank pain   Last seen January 2025, at that time had had accupuncture to help with her alpha gal, she saw improvement with this. Using miralax , stool softener PRN. Having some RUQ pain, GERD well controlled on protonix  40mg  daily  Recommended continue protonix  40mg , can try every other day, discuss TCA with PCP, colonscopy due in November  Present:  She states that her RUQ/R flank pain became severe last week, she saw PCP and was started on antibiotics and had CT which was negative. She stopped antibiotics but states she feels she has a yeast infection due to them. She did stop her wegovy a few days ago because she was concerned this may be contributing. She is using heat and ice on the area, which helps some. She endorses a lot of pain in her R hip/right lower back radiating towards her abdomen (last MRI of lumbar spine in 2023, showed mild lumar DD, mild spinal stenosis, mutilevel facet arthrosis, severe on left at L5-S1).   Bowel movements have been more normal over the last few days since coming off of Wegovy. No rectal bleeding or melena  GERD well managed on protonix  40mg  daily,  usually does well unless she eats something that flares up her symptoms, if so she will repeat her dose.most of the time she does well to avoid trigger foods. No dysphagia or odynophagia.   She reports recent positive alpha gal testing, she had another tick bite in February.   CT A/P with contrast: 10/2022  No acute findings in the abdomen or pelvis. Specifically, no findings to explain the patient's history of right lower quadrant pain and bloody stools. 2. Left colonic diverticulosis without diverticulitis. 3. Tiny hiatal hernia. Last Colonoscopy:10/18/21 - One 5 mm polyp in the cecum-sessile serrated - Diverticulosis in the sigmoid colon. - Congested mucosa in the sigmoid colon. - Non-bleeding internal hemorrhoids. Last EGD:10/18/21 - No endoscopic esophageal abnormality to explain patient's dysphagia. Esophagus dilated. Biopsied. Increased eosinophils but <15, on PPI BID  - 1 cm hiatal hernia. - Normal stomach. - Normal examined duodenum.   Recommendations:  Repeat tcs in 3 years  Filed Weights   06/12/24 1441  Weight: 191 lb 3.2 oz (86.7 kg)     Past Medical History:  Diagnosis Date   Acute ST elevation myocardial infarction (STEMI) of inferior wall (HCC) 2012   Anxiety    Arthritis    CAD (coronary artery disease)    DES to the proximal and distal RCA April 2012 in the setting of inferior STEMI; occluded mid to distal RCA with left-to-right collaterals 2018, managed medically   Cancer (HCC)    Carotid stenosis  Less than 50% 8/11   Chronic low back pain    Depression    Diverticulitis    Diverticulosis    Essential hypertension    Fibromyalgia    GERD (gastroesophageal reflux disease) 03/02/2014   H/O hiatal hernia    History of kidney stones    Hyperlipidemia    Pneumonia    Urticaria     Past Surgical History:  Procedure Laterality Date   BIOPSY  10/18/2021   Procedure: BIOPSY;  Surgeon: Eartha Angelia Sieving, MD;  [facility]: [facility];  Service:  Gastroenterology;;   CARDIAC SURGERY     stent placement   CHOLECYSTECTOMY     COLONOSCOPY N/A 04/01/2014   Procedure: COLONOSCOPY;  Surgeon: Claudis RAYMOND Rivet, MD;  Location: [facility];  Service: Endoscopy;  Laterality: N/A;  100   COLONOSCOPY WITH PROPOFOL  N/A 10/18/2021   Procedure: COLONOSCOPY WITH PROPOFOL ;  Surgeon: Eartha Angelia Sieving, MD;  Location: [facility];  Service: Gastroenterology;  Laterality: N/A;  8:30   Cyst removed from spine     ECTOPIC PREGNANCY SURGERY     ESOPHAGOGASTRODUODENOSCOPY N/A 04/01/2014   Procedure: ESOPHAGOGASTRODUODENOSCOPY (EGD);  Surgeon: Claudis RAYMOND Rivet, MD;  [facility]: [facility];  Service: Endoscopy;  Laterality: N/A;  100   ESOPHAGOGASTRODUODENOSCOPY N/A 11/03/2016   Procedure: ESOPHAGOGASTRODUODENOSCOPY (EGD);  Surgeon: Claudis RAYMOND Rivet, MD;  [facility]: [facility];  Service: Endoscopy;  Laterality: N/A;  2:45   ESOPHAGOGASTRODUODENOSCOPY (EGD) WITH PROPOFOL  N/A 10/18/2021   Procedure: ESOPHAGOGASTRODUODENOSCOPY (EGD) WITH PROPOFOL ;  Surgeon: Eartha Angelia Sieving, MD;  [facility]: [facility];  Service: Gastroenterology;  Laterality: N/A;   Heart stent     2012  x 2 stents   KNEE ARTHROPLASTY Right    LEFT HEART CATH AND CORONARY ANGIOGRAPHY N/A 07/03/2017   Procedure: LEFT HEART CATH AND CORONARY ANGIOGRAPHY;  Surgeon: Verlin Lonni BIRCH, MD;  Location: [facility];  Service: Cardiovascular;  Laterality: N/A;   Left knee     Arthroscopic   Left wrist     POLYPECTOMY  10/18/2021   Procedure: POLYPECTOMY;  Surgeon: Eartha Angelia Sieving, MD;  [facility]: [facility];  Service: Gastroenterology;;   HARLEY DILATION  10/18/2021   Procedure: HARLEY DILATION;  Surgeon: Eartha Angelia, Sieving, MD;  Location: [facility];  Service: Gastroenterology;;   TONSILLECTOMY AND ADENOIDECTOMY     TOTAL KNEE ARTHROPLASTY Left 09/26/2013   Procedure: TOTAL KNEE ARTHROPLASTY- LEFT;  Surgeon: LELON BIRCH Shari Mickey., MD;  Location: [facility];  Service: Orthopedics;  Laterality: Left;   TOTAL KNEE ARTHROPLASTY Right 11/25/2021   Procedure: TOTAL KNEE ARTHROPLASTY;  Surgeon: Shari Sieving, MD;  Location: [facility];  Service: Orthopedics;  Laterality: Right;   TUBAL LIGATION      Current Outpatient Medications  Medication Sig Dispense Refill   acetaminophen  (TYLENOL ) 650 MG CR tablet Take 650 mg by mouth every 8 (eight) hours as needed for pain.     amLODipine  (NORVASC ) 2.5 MG tablet TAKE 1 TABLET(2.5 MG) BY MOUTH DAILY 90 tablet 3   antiseptic oral rinse (BIOTENE) LIQD 15 mLs by Mouth Rinse route daily as needed for dry mouth.     aspirin  81 MG chewable tablet Chew by mouth daily.     hydrocortisone  (ANUSOL -HC) 2.5 % rectal cream Apply [id number] Applications topically 2 (two) times daily.     isosorbide  mononitrate (IMDUR ) 30 MG 24 hr tablet TAKE 1 TABLET(30 MG) BY MOUTH DAILY 90 tablet 1   losartan  (COZAAR ) 100 MG tablet  TAKE 1 TABLET(100 MG) BY MOUTH DAILY 30 tablet 5   nitroGLYCERIN  (NITROSTAT ) 0.4 MG SL tablet Place 1 tablet (0.4 mg total) under the tongue every 5 (five) minutes x 3 doses as needed for chest pain (if no relief after 2nd dose, proceed to ED or call 911). 25 tablet 3   ondansetron  (ZOFRAN -ODT) 8 MG disintegrating tablet 8 mg every 8 (eight) hours as needed.     pantoprazole  (PROTONIX ) 40 MG tablet TAKE 1 TABLET(40 MG) BY MOUTH TWICE DAILY 180 tablet 1   polyethylene glycol (MIRALAX  / GLYCOLAX ) 17 g packet Take 17 g by mouth daily. (Patient taking differently: Take 17 g by mouth daily. Every other day as needed) 14 each 0   REPATHA  SURECLICK 140 MG/ML SOAJ ADMINISTER 1 ML UNDER THE SKIN EVERY 14 DAYS AS DIRECTED 6 mL 3   sertraline (ZOLOFT) 100 MG tablet Take 100 mg by mouth daily.     SF 5000 PLUS 1.1 % CREA dental cream Take by mouth.     WEGOVY 0.5 MG/0.5ML SOAJ Inject 0.5 mg into the skin once a week.     zolpidem  (AMBIEN ) 10 MG tablet Take 10 mg by mouth at bedtime.   3   No current  facility-administered medications for this visit.    Allergies as of 06/12/2024 - Review Complete 06/12/2024  Allergen Reaction Noted   Ancef  [cefazolin ] Itching 04/01/2014   Ciprofloxacin hcl  03/02/2014   Meat [alpha-gal] Diarrhea    Metaxalone  03/03/2014   Morphine  and codeine  Itching 09/15/2021   Sulfa antibiotics Itching, Rash, and Other (See Comments) 09/16/2013    Social History   Socioeconomic History   Marital status: Married    Spouse name: Not on file   Number of children: Not on file   Years of education: Not on file   Highest education level: Not on file  Occupational History   Not on file  Tobacco Use   Smoking status: Former    Current packs/day: 0.00    Average packs/day: 1 pack/day for 31.4 years (31.4 ttl pk-yrs)    Types: Cigarettes    Start date: 06/27/1979    Quit date: 11/27/2010    Years since quitting: 13.5    Passive exposure: Past   Smokeless tobacco: Never  Vaping Use   Vaping status: Never Used  Substance and Sexual Activity   Alcohol  use: Yes    Alcohol /week: 0.0 standard drinks of alcohol     Comment: social   Drug use: Yes    Types: Marijuana   Sexual activity: Not Currently    Birth control/protection: Post-menopausal, Surgical    Comment: tubal  Other Topics Concern   Not on file  Social History Narrative   Not on file   Social Drivers of Health   Financial Resource Strain: Low Risk  (03/03/2024)   Overall Financial Resource Strain (CARDIA)    Difficulty of Paying Living Expenses: Not hard at all  Food Insecurity: No Food Insecurity (03/03/2024)   Hunger Vital Sign    Worried About Running Out of Food in the Last Year: Never true    Ran Out of Food in the Last Year: Never true  Transportation Needs: No Transportation Needs (03/03/2024)   PRAPARE - Administrator, Civil Service (Medical): No    Lack of Transportation (Non-Medical): No  Physical Activity: Insufficiently Active (03/03/2024)   Exercise Vital Sign    Days of  Exercise per Week: 1 day    Minutes of Exercise per  Session: 10 min  Stress: Stress Concern Present (03/03/2024)   [employer] of Occupational Health - Occupational Stress Questionnaire    Feeling of Stress : To some extent  Social Connections: Moderately Isolated (03/03/2024)   Social Connection and Isolation Panel    Frequency of Communication with Friends and Family: Once a week    Frequency of Social Gatherings with Friends and Family: Once a week    Attends Religious Services: More than 4 times per year    Active Member of [employer] or Organizations: No    Attends Engineer, structural: Never    Marital Status: Married    Review of systems General: negative for malaise, night sweats, fever, chills, weight loss Neck: Negative for lumps, goiter, pain and significant neck swelling Resp: Negative for cough, wheezing, dyspnea at rest CV: Negative for chest pain, leg swelling, palpitations, orthopnea GI: denies melena, hematochezia, nausea, vomiting, diarrhea, constipation, dysphagia, odyonophagia, early satiety or unintentional weight loss.  MSK: Negative for joint pain or swelling, and muscle pain. +R hip/back pain  Derm: Negative for itching or rash Psych: Denies depression, anxiety, memory loss, confusion. No homicidal or suicidal ideation.  Heme: Negative for prolonged bleeding, bruising easily, and swollen nodes. Endocrine: Negative for cold or heat intolerance, polyuria, polydipsia and goiter. Neuro: negative for tremor, gait imbalance, syncope and seizures. The remainder of the review of systems is noncontributory.  Physical Exam: BP 125/72   Pulse 76   Temp 97.6 F (36.4 C)   Ht 5' 2 (1.575 m)   Wt 191 lb 3.2 oz (86.7 kg)   BMI 34.97 kg/m  General:   Alert and oriented. No distress noted. Pleasant and cooperative.  Head:  Normocephalic and atraumatic. Eyes:  Conjuctiva clear without scleral icterus. Mouth:  Oral mucosa pink and moist. Good dentition. No  lesions. Heart: Normal rate and rhythm, s1 and s2 heart sounds present.  Lungs: Clear lung sounds in all lobes. Respirations equal and unlabored. Abdomen:  +BS, soft, non-tender (TTP of R flank area but not abdomen) and non-distended. No rebound or guarding. No HSM or masses noted. Derm: No palmar erythema or jaundice Msk:  Symmetrical without gross deformities. Normal posture. Extremities:  Without edema. Neurologic:  Alert and  oriented x4 Psych:  Alert and cooperative. Normal mood and affect.  Invalid input(s): 6 MONTHS   ASSESSMENT: [name] is a 68 y.o. female presenting today for follow up of IBS, GERD, RUQ/R flank pain  GERD: well controlled on protonix  40mg  daily as long as she avoids trigger foods. No dysphagia or odynophagia. Will continue with current PPI regimen, good reflux precautions  IBS/alpha gal: moving bowels better since coming off of wegovy. Previous improvement with accupuncture for her alpha gal as well. No diarrhea recently. Denies rectal bleeding or melena  RUQ/R flank pain: s/p cholecystectomy in the past. Pain is chronic/intermittent, no real precipitating or alleviating factors. She has had workup to include EGD/US , multiple CTs with no definitive cause found In her abdomen to explain her pain. Has tried bentyl  and levsin  which did not provide much relief. Recent CT as above, last week without any acute findings. Queried in the past if this was functional abdominal pain, but she endorses more radiation of pain from her lower back/hip today, has degenerative disc and spinal stenosis changes on spinal MRI from 2023 and notably had more pain upon movement to get into supine position for abdominal exam today, I suspect this may be referred pain from her back. I  encouraged her to discuss referral to neurology/neurosurgeon with her PCP.   She is due for repeat colonoscopy in November due to history of colon polyps, will ensure she is on the recall list to be  scheduled closer to due time for this.    PLAN:  -Please discuss neurology/neurosurgeon referral with your PCP -Continue protonix  40mg  daily -Be mindful of greasy, spicy, fried, citrus foods, caffeine, carbonated drinks, chocolate and alcohol  as these can increase reflux symptoms -Stay upright 2-3 hours after eating, prior to lying down and avoid eating late in the evenings. -Repeat colonoscopy due in November   All questions were answered, patient verbalized understanding and is in agreement with plan as outlined above.   Follow Up: 1 year   Audric Venn L. Mariette, MSN, APRN, AGNP-C Adult-Gerontology Nurse Practitioner [facility]   I have reviewed the note and agree with the APP's assessment as described in this progress note  Toribio Fortune, MD Gastroenterology and Hepatology [facility]

## 2024-06-12 NOTE — Patient Instructions (Signed)
 Please discuss neurology/neurosurgeon referral with your PCP as I suspect you pain may be referred from your spinal issues Continue protonix  40mg  daily Be mindful of greasy, spicy, fried, citrus foods, caffeine, carbonated drinks, chocolate and alcohol  as these can increase reflux symptoms Stay upright 2-3 hours after eating, prior to lying down and avoid eating late in the evenings. Repeat colonoscopy due in November, we will reach out closer to time to schedule this  Follow up 1 year  It was a pleasure to see you today. I want to create trusting relationships with patients and provide genuine, compassionate, and quality care. I truly value your feedback! please be on the lookout for a survey regarding your visit with me today. I appreciate your input about our visit and your time in completing this!    Asal Teas L. Coleta Grosshans, MSN, APRN, AGNP-C Adult-Gerontology Nurse Practitioner [facility] at [facility]

## 2024-06-23 ENCOUNTER — Encounter: Payer: Self-pay | Admitting: Physical Medicine & Rehabilitation

## 2024-07-19 ENCOUNTER — Other Ambulatory Visit (INDEPENDENT_AMBULATORY_CARE_PROVIDER_SITE_OTHER): Payer: Self-pay | Admitting: Gastroenterology

## 2024-07-21 DIAGNOSIS — Z85828 Personal history of other malignant neoplasm of skin: Secondary | ICD-10-CM | POA: Diagnosis not present

## 2024-07-21 DIAGNOSIS — L309 Dermatitis, unspecified: Secondary | ICD-10-CM | POA: Diagnosis not present

## 2024-08-20 ENCOUNTER — Encounter: Admitting: Physical Medicine & Rehabilitation

## 2024-08-20 DIAGNOSIS — L570 Actinic keratosis: Secondary | ICD-10-CM | POA: Diagnosis not present

## 2024-08-20 DIAGNOSIS — L280 Lichen simplex chronicus: Secondary | ICD-10-CM | POA: Diagnosis not present

## 2024-08-20 DIAGNOSIS — L663 Perifolliculitis capitis abscedens: Secondary | ICD-10-CM | POA: Diagnosis not present

## 2024-09-10 ENCOUNTER — Encounter (INDEPENDENT_AMBULATORY_CARE_PROVIDER_SITE_OTHER): Payer: Self-pay | Admitting: Gastroenterology

## 2024-09-11 ENCOUNTER — Encounter (INDEPENDENT_AMBULATORY_CARE_PROVIDER_SITE_OTHER): Payer: Self-pay | Admitting: *Deleted

## 2024-09-18 DIAGNOSIS — L218 Other seborrheic dermatitis: Secondary | ICD-10-CM | POA: Diagnosis not present

## 2024-09-18 DIAGNOSIS — L663 Perifolliculitis capitis abscedens: Secondary | ICD-10-CM | POA: Diagnosis not present

## 2024-09-29 ENCOUNTER — Telehealth: Payer: Self-pay

## 2024-09-29 NOTE — Telephone Encounter (Signed)
 Who is your primary care physician: Nikesha Kwasny  Reasons for the colonoscopy: Hx polyps  Have you had a colonoscopy before?  yes  Do you have family history of colon cancer? Yes father  Previous colonoscopy with polyps removed? yes  Do you have a history colorectal cancer?   no  Are you diabetic? If yes, Type 1 or Type 2?    no  Do you have a prosthetic or mechanical heart valve? no  Do you have a pacemaker/defibrillator?   no  Have you had endocarditis/atrial fibrillation? no  Have you had joint replacement within the last 12 months?  no  Do you tend to be constipated or have to use laxatives? no  Do you have any history of drugs or alchohol?  no  Do you use supplemental oxygen ?  no  Have you had a stroke or heart attack within the last 6 months? no  Do you take weight loss medication?  no  For female patients: have you had a hysterectomy?  no                                     are you post menopausal?       yes                                            do you still have your menstrual cycle? no      Do you take any blood-thinning medications such as: (aspirin , warfarin, Plavix, Aggrenox)  yes  If yes we need the name, milligram, dosage and who is prescribing doctor ASA 81 mg Current Outpatient Medications on File Prior to Visit  Medication Sig Dispense Refill   aspirin  81 MG chewable tablet Chew by mouth daily.     DULoxetine  (CYMBALTA ) 60 MG capsule Take 60 mg by mouth daily.     isosorbide  mononitrate (IMDUR ) 30 MG 24 hr tablet TAKE 1 TABLET(30 MG) BY MOUTH DAILY 90 tablet 1   losartan  (COZAAR ) 100 MG tablet TAKE 1 TABLET(100 MG) BY MOUTH DAILY 30 tablet 5   pantoprazole  (PROTONIX ) 40 MG tablet TAKE 1 TABLET(40 MG) BY MOUTH TWICE DAILY 180 tablet 1   No current facility-administered medications on file prior to visit.    Allergies  Allergen Reactions   Ancef  [Cefazolin ] Itching    Itching around mouth - sx resolved quickly after administration of benadryl ;  with no further sx   Ciprofloxacin Hcl     unknown   Meat [Alpha-Gal] Diarrhea   Metaxalone     unknown   Morphine  And Codeine  Itching    Mild itching to head  and extremities, self resolved without intervention.    Sulfa Antibiotics Itching, Rash and Other (See Comments)    severe muscle cramps, tongue cracked, dry mouth      Pharmacy: [facility]   Primary Insurance Name: Cher Hails [id number]  Best number where you can be reached: [phone] or [phone]

## 2024-10-01 MED ORDER — PEG 3350-KCL-NA BICARB-NACL 420 G PO SOLR: 4000.0000 mL | Freq: Once | ORAL | 0 refills | Status: AC

## 2024-10-01 NOTE — Addendum Note (Signed)
 Addended by: JEANELL GRAEME RAMAN on: 10/01/2024 09:42 AM   Modules accepted: Orders

## 2024-10-01 NOTE — Telephone Encounter (Signed)
 Questionnaire from recall, no referral needed

## 2024-10-01 NOTE — Telephone Encounter (Signed)
 Spoke with pt. She has been scheduled for 12/5. Aware will mail instructions and will send rx for prep to pharmacy

## 2024-10-03 ENCOUNTER — Other Ambulatory Visit: Payer: Self-pay | Admitting: Cardiology

## 2024-10-17 ENCOUNTER — Other Ambulatory Visit: Payer: Self-pay

## 2024-10-21 MED ORDER — ISOSORBIDE MONONITRATE ER 30 MG PO TB24: 30.0000 mg | ORAL_TABLET | Freq: Every day | ORAL | 1 refills | Status: AC

## 2024-10-25 ENCOUNTER — Other Ambulatory Visit (INDEPENDENT_AMBULATORY_CARE_PROVIDER_SITE_OTHER): Payer: Self-pay | Admitting: Gastroenterology

## 2024-10-27 ENCOUNTER — Encounter (HOSPITAL_COMMUNITY)
Admission: RE | Admit: 2024-10-27 | Discharge: 2024-10-27 | Disposition: A | Source: Ambulatory Visit | Attending: Gastroenterology

## 2024-10-27 ENCOUNTER — Encounter (HOSPITAL_COMMUNITY): Payer: Self-pay

## 2024-10-27 ENCOUNTER — Other Ambulatory Visit: Payer: Self-pay

## 2024-10-28 DIAGNOSIS — L663 Perifolliculitis capitis abscedens: Secondary | ICD-10-CM | POA: Diagnosis not present

## 2024-10-31 ENCOUNTER — Ambulatory Visit (HOSPITAL_COMMUNITY)

## 2024-10-31 ENCOUNTER — Ambulatory Visit (HOSPITAL_COMMUNITY)
Admission: RE | Admit: 2024-10-31 | Discharge: 2024-10-31 | Disposition: A | Attending: Gastroenterology | Admitting: Gastroenterology

## 2024-10-31 ENCOUNTER — Encounter (HOSPITAL_COMMUNITY): Payer: Self-pay | Admitting: Gastroenterology

## 2024-10-31 ENCOUNTER — Encounter (HOSPITAL_COMMUNITY): Admission: RE | Disposition: A | Payer: Self-pay | Source: Home / Self Care | Attending: Gastroenterology

## 2024-10-31 DIAGNOSIS — D122 Benign neoplasm of ascending colon: Secondary | ICD-10-CM | POA: Diagnosis not present

## 2024-10-31 DIAGNOSIS — K635 Polyp of colon: Secondary | ICD-10-CM

## 2024-10-31 DIAGNOSIS — K5730 Diverticulosis of large intestine without perforation or abscess without bleeding: Secondary | ICD-10-CM

## 2024-10-31 DIAGNOSIS — Z860101 Personal history of adenomatous and serrated colon polyps: Secondary | ICD-10-CM

## 2024-10-31 DIAGNOSIS — K6389 Other specified diseases of intestine: Secondary | ICD-10-CM | POA: Diagnosis not present

## 2024-10-31 DIAGNOSIS — Z1211 Encounter for screening for malignant neoplasm of colon: Secondary | ICD-10-CM | POA: Diagnosis not present

## 2024-10-31 DIAGNOSIS — D124 Benign neoplasm of descending colon: Secondary | ICD-10-CM | POA: Diagnosis not present

## 2024-10-31 DIAGNOSIS — Q439 Congenital malformation of intestine, unspecified: Secondary | ICD-10-CM | POA: Diagnosis not present

## 2024-10-31 DIAGNOSIS — Z860100 Personal history of colon polyps, unspecified: Secondary | ICD-10-CM

## 2024-10-31 DIAGNOSIS — I2510 Atherosclerotic heart disease of native coronary artery without angina pectoris: Secondary | ICD-10-CM | POA: Diagnosis not present

## 2024-10-31 DIAGNOSIS — K648 Other hemorrhoids: Secondary | ICD-10-CM | POA: Diagnosis not present

## 2024-10-31 LAB — HM COLONOSCOPY

## 2024-10-31 SURGERY — COLONOSCOPY
Anesthesia: General

## 2024-10-31 MED ORDER — LIDOCAINE 2% (20 MG/ML) 5 ML SYRINGE
INTRAMUSCULAR | Status: DC | PRN
  Administered 2024-10-31: 60 mg via INTRAVENOUS

## 2024-10-31 MED ORDER — LACTATED RINGERS IV SOLN: INTRAVENOUS | Status: DC

## 2024-10-31 MED ORDER — PROPOFOL 500 MG/50ML IV EMUL
INTRAVENOUS | Status: DC | PRN
  Administered 2024-10-31: 50 mg via INTRAVENOUS
  Administered 2024-10-31: 150 ug/kg/min via INTRAVENOUS
  Administered 2024-10-31 (×2): 50 mg via INTRAVENOUS
  Administered 2024-10-31: 100 mg via INTRAVENOUS

## 2024-10-31 MED ORDER — LACTATED RINGERS IV SOLN: INTRAVENOUS | Status: DC | PRN

## 2024-10-31 NOTE — H&P (Signed)
 [name] is an 68 y.o. female.   Chief Complaint: history of colon polyps. HPI: [name] is a 68 y.o. female with past medical history of alpha gal sensitivity, anxiety, coronary artery disease status post stent placement, carotid stenosis, hyperlipidemia, nephrolithiasis, GERD, hypertension and IBS, coming for history of colon polyps.  Last colonoscopy on 10/18/21, had one TA. The patient denies having any complaints such as melena, hematochezia, abdominal pain or distention, change in her bowel movement consistency or frequency, no changes in weight recently. Father had colon cancer.   Past Medical History:  Diagnosis Date   Acute ST elevation myocardial infarction (STEMI) of inferior wall (HCC) 2012   Anxiety    Arthritis    CAD (coronary artery disease)    DES to the proximal and distal RCA April 2012 in the setting of inferior STEMI; occluded mid to distal RCA with left-to-right collaterals 2018, managed medically   Cancer (HCC)    Carotid stenosis    Less than 50% 8/11   Chronic low back pain    Depression    Diverticulitis    Diverticulosis    Essential hypertension    Fibromyalgia    GERD (gastroesophageal reflux disease) 03/02/2014   H/O hiatal hernia    History of kidney stones    Hyperlipidemia    Pneumonia    Urticaria     Past Surgical History:  Procedure Laterality Date   BIOPSY  10/18/2021   Procedure: BIOPSY;  Surgeon: Eartha Angelia Sieving, MD;  Location: [facility];  Service: Gastroenterology;;   CARDIAC SURGERY     stent placement X2   CHOLECYSTECTOMY     COLONOSCOPY N/A 04/01/2014   Procedure: COLONOSCOPY;  Surgeon: Claudis RAYMOND Rivet, MD;  Location: [facility];  Service: Endoscopy;  Laterality: N/A;  100   COLONOSCOPY WITH PROPOFOL  N/A 10/18/2021   Procedure: COLONOSCOPY WITH PROPOFOL ;  Surgeon: Eartha Angelia Sieving, MD;  Location: [facility];  Service: Gastroenterology;  Laterality: N/A;  8:30   Cyst removed from spine      ECTOPIC PREGNANCY SURGERY     ESOPHAGOGASTRODUODENOSCOPY N/A 04/01/2014   Procedure: ESOPHAGOGASTRODUODENOSCOPY (EGD);  Surgeon: Claudis RAYMOND Rivet, MD;  Location: [facility];  Service: Endoscopy;  Laterality: N/A;  100   ESOPHAGOGASTRODUODENOSCOPY N/A 11/03/2016   Procedure: ESOPHAGOGASTRODUODENOSCOPY (EGD);  Surgeon: Claudis RAYMOND Rivet, MD;  Location: [facility];  Service: Endoscopy;  Laterality: N/A;  2:45   ESOPHAGOGASTRODUODENOSCOPY (EGD) WITH PROPOFOL  N/A 10/18/2021   Procedure: ESOPHAGOGASTRODUODENOSCOPY (EGD) WITH PROPOFOL ;  Surgeon: Eartha Angelia Sieving, MD;  Location: [facility];  Service: Gastroenterology;  Laterality: N/A;   Heart stent     2012  x 2 stents   KNEE ARTHROPLASTY Right    LEFT HEART CATH AND CORONARY ANGIOGRAPHY N/A 07/03/2017   Procedure: LEFT HEART CATH AND CORONARY ANGIOGRAPHY;  Surgeon: Verlin Lonni BIRCH, MD;  Location: [facility];  Service: Cardiovascular;  Laterality: N/A;   Left knee     Arthroscopic   Left wrist     POLYPECTOMY  10/18/2021   Procedure: POLYPECTOMY;  Surgeon: Eartha Angelia Sieving, MD;  Location: [facility];  Service: Gastroenterology;;   HARLEY DILATION  10/18/2021   Procedure: HARLEY DILATION;  Surgeon: Eartha Angelia, Sieving, MD;  Location: [facility];  Service: Gastroenterology;;   TONSILLECTOMY AND ADENOIDECTOMY     TOTAL KNEE ARTHROPLASTY Left 09/26/2013   Procedure: TOTAL KNEE ARTHROPLASTY- LEFT;  Surgeon: LELON BIRCH Shari Mickey., MD;  Location: [facility];  Service: Orthopedics;  Laterality: Left;   TOTAL KNEE ARTHROPLASTY Right 11/25/2021   Procedure: TOTAL KNEE ARTHROPLASTY;  Surgeon: Shari Sieving, MD;  Location: [facility];  Service: Orthopedics;  Laterality: Right;   TUBAL LIGATION      Family History  Problem Relation Age of Onset   Colon cancer Father    Heart disease Mother    Heart disease Other    Arthritis Other    Cancer Other    Allergic rhinitis Neg Hx    Angioedema Neg Hx    Asthma Neg  Hx    Atopy Neg Hx    Eczema Neg Hx    Immunodeficiency Neg Hx    Urticaria Neg Hx    Social History:  reports that she quit smoking about 13 years ago. Her smoking use included cigarettes. She started smoking about 45 years ago. She has a 31.4 pack-year smoking history. She has been exposed to tobacco smoke. She has never used smokeless tobacco. She reports current alcohol  use. She reports current drug use. Drug: Marijuana.  Allergies:  Allergies  Allergen Reactions   Ancef  [Cefazolin ] Itching    Itching around mouth - sx resolved quickly after administration of benadryl ; with no further sx   Ciprofloxacin Hcl     unknown   Meat [Alpha-Gal] Diarrhea   Metaxalone     unknown   Morphine  And Codeine  Itching    Mild itching to head  and extremities, self resolved without intervention.    Sulfa Antibiotics Itching, Rash and Other (See Comments)    severe muscle cramps, tongue cracked, dry mouth     Medications Prior to Admission  Medication Sig Dispense Refill   aspirin  81 MG chewable tablet Chew by mouth daily.     DULoxetine  (CYMBALTA ) 60 MG capsule Take 60 mg by mouth daily.     isosorbide  mononitrate (IMDUR ) 30 MG 24 hr tablet Take 1 tablet (30 mg total) by mouth daily. 90 tablet 1   losartan  (COZAAR ) 100 MG tablet TAKE 1 TABLET(100 MG) BY MOUTH DAILY 90 tablet 2   pantoprazole  (PROTONIX ) 40 MG tablet TAKE 1 TABLET(40 MG) BY MOUTH TWICE DAILY 180 tablet 1    No results found for this or any previous visit (from the past 48 hours). No results found.  Review of Systems  All other systems reviewed and are negative.   Blood pressure (!) 132/103, pulse 88, temperature 98.3 F (36.8 C), temperature source Oral, resp. rate 15, height 5' 2 (1.575 m), weight 90.7 kg, SpO2 100%. Physical Exam  GENERAL: The patient is AO x3, in no acute distress. HEENT: Head is normocephalic and atraumatic. EOMI are intact. Mouth is well hydrated and without lesions. NECK: Supple. No  masses LUNGS: Clear to auscultation. No presence of rhonchi/wheezing/rales. Adequate chest expansion HEART: RRR, normal s1 and s2. ABDOMEN: Soft, nontender, no guarding, no peritoneal signs, and nondistended. BS +. No masses. EXTREMITIES: Without any cyanosis, clubbing, rash, lesions or edema. NEUROLOGIC: AOx3, no focal motor deficit. SKIN: no jaundice, no rashes  Assessment/Plan [name] is a 68 y.o. female with past medical history of alpha gal sensitivity, anxiety, coronary artery disease status post stent placement, carotid stenosis, hyperlipidemia, nephrolithiasis, GERD, hypertension and IBS, coming for history of colon polyps. We will proceed with colonoscopy.  Sieving Eartha Flavors, MD 10/31/2024, 7:41 AM

## 2024-10-31 NOTE — Transfer of Care (Signed)
 Immediate Anesthesia Transfer of Care Note  Patient: [name]  Procedure(s) Performed: COLONOSCOPY  Patient Location: PACU  Anesthesia Type:MAC  Level of Consciousness: awake, alert , and oriented  Airway & Oxygen  Therapy: Patient Spontanous Breathing  Post-op Assessment: Report given to RN and Post -op Vital signs reviewed and stable  Post vital signs: Reviewed and stable  Last Vitals:  Vitals Value Taken Time  BP 143/73 10/31/24 08:34  Temp 36.9 C 10/31/24 08:34  Pulse 89 10/31/24 08:34  Resp 20 10/31/24 08:34  SpO2 99 % 10/31/24 08:34    Last Pain:  Vitals:   10/31/24 0834  TempSrc: Oral  PainSc: 2       Patients Stated Pain Goal: 7 (10/31/24 0834)  Complications: No notable events documented.

## 2024-10-31 NOTE — Op Note (Signed)
 [facility] Patient Name: [name] Procedure Date: 10/31/2024 7:08 AM MRN: [id number] Date of Birth: 1956/07/26 Attending MD: Toribio Fortune , , [id number] CSN: [id number] Age: 68 Admit Type: Outpatient Procedure:                Colonoscopy Indications:              Screening in patient at increased risk: Family                            history of 1st-degree relative with colorectal                            cancer before age 94 years, Surveillance: Personal                            history of adenomatous polyps on last colonoscopy 3                            years ago Providers:                Toribio Fortune, Olam Ada, RN, Bascom Blush Referring MD:              Medicines:                Monitored Anesthesia Care Complications:            No immediate complications. Estimated Blood Loss:     Estimated blood loss: none. Procedure:                Pre-Anesthesia Assessment:                           - Prior to the procedure, a History and Physical                            was performed, and patient medications, allergies                            and sensitivities were reviewed. The patient's                            tolerance of previous anesthesia was reviewed.                           - The risks and benefits of the procedure and the                            sedation options and risks were discussed with the                            patient. All questions were answered and informed                            consent was obtained.                           - ASA Grade  Assessment: III - A patient with severe                            systemic disease.                           After obtaining informed consent, the colonoscope                            was passed under direct vision. Throughout the                            procedure, the patient's blood pressure, pulse, and                            oxygen  saturations were monitored continuously.  The                            [id number] ([id number]) Peds Colon was introduced                            through the anus and advanced to the the cecum,                            identified by appendiceal orifice and ileocecal                            valve. The colonoscopy was somewhat difficult due                            to a tortuous colon. Successful completion of the                            procedure was aided by applying abdominal pressure.                            The patient tolerated the procedure well. The                            quality of the bowel preparation was good. Scope In: 7:56:51 AM Scope Out: 8:25:40 AM Scope Withdrawal Time: 0 hours 12 minutes 39 seconds  Total Procedure Duration: 0 hours 28 minutes 49 seconds  Findings:      The perianal and digital rectal examinations were normal.      A 10 mm polyp was found in the ascending colon. The polyp was       semi-sessile. The polyp was removed with a cold snare. Resection and       retrieval were complete.      A 4 mm polyp was found in the descending colon. The polyp was sessile.       The polyp was removed with a cold snare. Resection and retrieval were       complete.      Scattered medium-mouthed and small-mouthed diverticula were found in the       sigmoid colon.  An area of significantly congested mucosa was found in the sigmoid       colon. This made scope passage difficult. Intermittent abdominal       pressure was applied in the suprapubic and epigastric area to       effectively advance the scope.      Non-bleeding internal hemorrhoids were found during retroflexion. The       hemorrhoids were small. Impression:               - One 10 mm polyp in the ascending colon, removed                            with a cold snare. Resected and retrieved.                           - One 4 mm polyp in the descending colon, removed                            with a cold snare. Resected and retrieved.                            - Diverticulosis in the sigmoid colon.                           - Congested mucosa in the sigmoid colon.                           - Non-bleeding internal hemorrhoids. Moderate Sedation:      Per Anesthesia Care Recommendation:           - Discharge patient to home (ambulatory).                           - Resume previous diet.                           - Await pathology results.                           - Repeat colonoscopy in 3 years for surveillance. Procedure Code(s):        --- Professional ---                           [phone], Colonoscopy, flexible; with removal of                            tumor(s), polyp(s), or other lesion(s) by snare                            technique Diagnosis Code(s):        --- Professional ---                           Z80.0, Family history of malignant neoplasm of                            digestive organs  Z86.010, Personal history of colonic polyps                           D12.2, Benign neoplasm of ascending colon                           D12.4, Benign neoplasm of descending colon                           K64.8, Other hemorrhoids                           K63.89, Other specified diseases of intestine                           K57.30, Diverticulosis of large intestine without                            perforation or abscess without bleeding CPT copyright 2022 [employer]. All rights reserved. The codes documented in this report are preliminary and upon coder review may  be revised to meet current compliance requirements. Toribio Fortune, MD Toribio Fortune,  10/31/2024 8:34:22 AM This report has been signed electronically. Number of Addenda: 0

## 2024-10-31 NOTE — Discharge Instructions (Signed)
 You are being discharged to home.  Resume your previous diet.  We are waiting for your pathology results.  Your physician has recommended a repeat colonoscopy in three years for surveillance.

## 2024-10-31 NOTE — Anesthesia Postprocedure Evaluation (Signed)
 Anesthesia Post Note  Patient: [name]  Procedure(s) Performed: COLONOSCOPY  Patient location during evaluation: PACU Anesthesia Type: General Level of consciousness: awake and alert Pain management: pain level controlled Vital Signs Assessment: post-procedure vital signs reviewed and stable Respiratory status: spontaneous breathing, nonlabored ventilation, respiratory function stable and patient connected to nasal cannula oxygen  Cardiovascular status: blood pressure returned to baseline and stable Postop Assessment: no apparent nausea or vomiting Anesthetic complications: no   No notable events documented.   Last Vitals:  Vitals:   10/31/24 0637 10/31/24 0834  BP: (!) 132/103 (!) 143/73  Pulse: 88 89  Resp: 15 20  Temp: 36.8 C 36.9 C  SpO2: 100% 99%    Last Pain:  Vitals:   10/31/24 0834  TempSrc: Oral  PainSc: 2                  Andrea Limes

## 2024-10-31 NOTE — Anesthesia Preprocedure Evaluation (Addendum)
 Anesthesia Evaluation  Patient identified by MRN, date of birth, ID band Patient awake    Reviewed: Allergy & Precautions, H&P , NPO status , Patient's Chart, lab work & pertinent test results  Airway Mallampati: III  TM Distance: >3 FB Neck ROM: Full    Dental no notable dental hx.    Pulmonary pneumonia, former smoker   Pulmonary exam normal breath sounds clear to auscultation       Cardiovascular hypertension, + CAD and + Past MI  Normal cardiovascular exam Rhythm:Regular Rate:Normal  Stents 2012 Repeat cath 2018 Denies cp/sob   Neuro/Psych  PSYCHIATRIC DISORDERS Anxiety Depression     Neuromuscular disease    GI/Hepatic Neg liver ROS, hiatal hernia,GERD  ,,  Endo/Other  negative endocrine ROS    Renal/GU negative Renal ROS  negative genitourinary   Musculoskeletal  (+) Arthritis ,  Fibromyalgia -  Abdominal   Peds negative pediatric ROS (+)  Hematology negative hematology ROS (+)   Anesthesia Other Findings   Reproductive/Obstetrics negative OB ROS                              Anesthesia Physical Anesthesia Plan  ASA: 3  Anesthesia Plan: General   Post-op Pain Management:    Induction: Intravenous  PONV Risk Score and Plan:   Airway Management Planned: Nasal Cannula  Additional Equipment:   Intra-op Plan:   Post-operative Plan:   Informed Consent: I have reviewed the patients History and Physical, chart, labs and discussed the procedure including the risks, benefits and alternatives for the proposed anesthesia with the patient or authorized representative who has indicated his/her understanding and acceptance.     Dental advisory given  Plan Discussed with: CRNA  Anesthesia Plan Comments:         Anesthesia Quick Evaluation

## 2024-11-03 ENCOUNTER — Encounter (INDEPENDENT_AMBULATORY_CARE_PROVIDER_SITE_OTHER): Payer: Self-pay | Admitting: *Deleted

## 2024-11-03 ENCOUNTER — Ambulatory Visit (INDEPENDENT_AMBULATORY_CARE_PROVIDER_SITE_OTHER): Payer: Self-pay | Admitting: Gastroenterology

## 2024-11-03 ENCOUNTER — Encounter (HOSPITAL_COMMUNITY): Payer: Self-pay | Admitting: Gastroenterology

## 2024-11-03 LAB — SURGICAL PATHOLOGY

## 2024-11-06 NOTE — Progress Notes (Signed)
 3 yr TCS noted in recall Patient result letter mailed procedure note and pathology result faxed to PCP
